# Patient Record
Sex: Female | Born: 1960 | Race: Black or African American | Hispanic: No | Marital: Single | Smoking: Never smoker
Health system: Southern US, Community
[De-identification: ages and names within clinical notes are randomized; demographics above are authoritative.]

## PROBLEM LIST (undated history)

## (undated) DIAGNOSIS — Z87442 Personal history of urinary calculi: Secondary | ICD-10-CM

## (undated) DIAGNOSIS — K219 Gastro-esophageal reflux disease without esophagitis: Secondary | ICD-10-CM

## (undated) DIAGNOSIS — N200 Calculus of kidney: Secondary | ICD-10-CM

## (undated) DIAGNOSIS — E785 Hyperlipidemia, unspecified: Secondary | ICD-10-CM

## (undated) DIAGNOSIS — N1330 Unspecified hydronephrosis: Secondary | ICD-10-CM

## (undated) DIAGNOSIS — E119 Type 2 diabetes mellitus without complications: Secondary | ICD-10-CM

## (undated) DIAGNOSIS — K222 Esophageal obstruction: Secondary | ICD-10-CM

## (undated) DIAGNOSIS — E669 Obesity, unspecified: Secondary | ICD-10-CM

## (undated) DIAGNOSIS — I10 Essential (primary) hypertension: Secondary | ICD-10-CM

## (undated) DIAGNOSIS — R011 Cardiac murmur, unspecified: Secondary | ICD-10-CM

## (undated) DIAGNOSIS — T8859XA Other complications of anesthesia, initial encounter: Secondary | ICD-10-CM

## (undated) DIAGNOSIS — D126 Benign neoplasm of colon, unspecified: Secondary | ICD-10-CM

## (undated) DIAGNOSIS — E079 Disorder of thyroid, unspecified: Secondary | ICD-10-CM

## (undated) DIAGNOSIS — B369 Superficial mycosis, unspecified: Secondary | ICD-10-CM

## (undated) HISTORY — DX: Benign neoplasm of colon, unspecified: D12.6

## (undated) HISTORY — DX: Type 2 diabetes mellitus without complications: E11.9

## (undated) HISTORY — DX: Esophageal obstruction: K22.2

## (undated) HISTORY — PX: WISDOM TOOTH EXTRACTION: SHX21

## (undated) HISTORY — DX: Calculus of kidney: N20.0

---

## 1997-08-21 ENCOUNTER — Encounter: Admission: RE | Admit: 1997-08-21 | Discharge: 1997-08-21 | Payer: Self-pay | Admitting: *Deleted

## 2003-07-04 ENCOUNTER — Emergency Department (HOSPITAL_COMMUNITY): Admission: EM | Admit: 2003-07-04 | Discharge: 2003-07-04 | Payer: Self-pay | Admitting: Emergency Medicine

## 2004-06-21 ENCOUNTER — Emergency Department (HOSPITAL_COMMUNITY): Admission: EM | Admit: 2004-06-21 | Discharge: 2004-06-21 | Payer: Self-pay | Admitting: Family Medicine

## 2004-06-22 ENCOUNTER — Emergency Department (HOSPITAL_COMMUNITY): Admission: EM | Admit: 2004-06-22 | Discharge: 2004-06-22 | Payer: Self-pay | Admitting: Emergency Medicine

## 2005-03-06 DIAGNOSIS — E119 Type 2 diabetes mellitus without complications: Secondary | ICD-10-CM

## 2005-03-06 HISTORY — DX: Type 2 diabetes mellitus without complications: E11.9

## 2005-07-12 ENCOUNTER — Emergency Department (HOSPITAL_COMMUNITY): Admission: EM | Admit: 2005-07-12 | Discharge: 2005-07-12 | Payer: Self-pay | Admitting: Family Medicine

## 2005-07-14 ENCOUNTER — Emergency Department (HOSPITAL_COMMUNITY): Admission: EM | Admit: 2005-07-14 | Discharge: 2005-07-14 | Payer: Self-pay | Admitting: Family Medicine

## 2006-11-10 ENCOUNTER — Emergency Department (HOSPITAL_COMMUNITY): Admission: EM | Admit: 2006-11-10 | Discharge: 2006-11-11 | Payer: Self-pay | Admitting: Emergency Medicine

## 2009-08-04 DIAGNOSIS — N200 Calculus of kidney: Secondary | ICD-10-CM

## 2009-08-04 HISTORY — DX: Calculus of kidney: N20.0

## 2009-08-25 ENCOUNTER — Ambulatory Visit: Payer: Self-pay | Admitting: Diagnostic Radiology

## 2009-08-25 ENCOUNTER — Encounter: Payer: Self-pay | Admitting: Emergency Medicine

## 2009-08-25 ENCOUNTER — Inpatient Hospital Stay (HOSPITAL_COMMUNITY): Admission: AD | Admit: 2009-08-25 | Discharge: 2009-08-28 | Payer: Self-pay | Admitting: Internal Medicine

## 2009-08-28 ENCOUNTER — Encounter: Payer: Self-pay | Admitting: Family Medicine

## 2009-08-30 ENCOUNTER — Ambulatory Visit: Payer: Self-pay | Admitting: Family Medicine

## 2009-08-30 ENCOUNTER — Encounter: Payer: Self-pay | Admitting: Physician Assistant

## 2009-08-30 DIAGNOSIS — E079 Disorder of thyroid, unspecified: Secondary | ICD-10-CM

## 2009-08-30 DIAGNOSIS — N1330 Unspecified hydronephrosis: Secondary | ICD-10-CM

## 2009-08-30 DIAGNOSIS — E1165 Type 2 diabetes mellitus with hyperglycemia: Secondary | ICD-10-CM | POA: Insufficient documentation

## 2009-08-30 DIAGNOSIS — N200 Calculus of kidney: Secondary | ICD-10-CM | POA: Insufficient documentation

## 2009-08-30 HISTORY — DX: Disorder of thyroid, unspecified: E07.9

## 2009-08-30 HISTORY — DX: Unspecified hydronephrosis: N13.30

## 2009-08-30 LAB — CONVERTED CEMR LAB
Bilirubin Urine: NEGATIVE
Glucose, Bld: 189 mg/dL
Glucose, Urine, Semiquant: NEGATIVE
Ketones, urine, test strip: NEGATIVE
Specific Gravity, Urine: 1.01
pH: 5

## 2009-08-31 ENCOUNTER — Encounter: Payer: Self-pay | Admitting: Physician Assistant

## 2009-08-31 LAB — CONVERTED CEMR LAB
BUN: 10 mg/dL (ref 6–23)
Creatinine, Ser: 0.68 mg/dL (ref 0.40–1.20)
Glucose, Bld: 172 mg/dL — ABNORMAL HIGH (ref 70–99)
Microalb Creat Ratio: 387.6 mg/g — ABNORMAL HIGH (ref 0.0–30.0)
Potassium: 4.1 meq/L (ref 3.5–5.3)
TSH: 0.747 microintl units/mL (ref 0.350–4.500)

## 2009-09-02 ENCOUNTER — Telehealth: Payer: Self-pay | Admitting: Physician Assistant

## 2009-09-08 ENCOUNTER — Encounter: Payer: Self-pay | Admitting: Physician Assistant

## 2009-09-14 ENCOUNTER — Ambulatory Visit: Payer: Self-pay | Admitting: Family Medicine

## 2009-09-14 DIAGNOSIS — M545 Low back pain, unspecified: Secondary | ICD-10-CM | POA: Insufficient documentation

## 2009-09-14 DIAGNOSIS — R110 Nausea: Secondary | ICD-10-CM | POA: Insufficient documentation

## 2009-11-18 ENCOUNTER — Encounter: Payer: Self-pay | Admitting: Physician Assistant

## 2009-11-18 LAB — CONVERTED CEMR LAB
BUN: 9 mg/dL (ref 6–23)
CO2: 22 meq/L (ref 19–32)
Chloride: 106 meq/L (ref 96–112)
Cholesterol: 199 mg/dL (ref 0–200)
HDL: 57 mg/dL (ref >39)
Hgb A1c MFr Bld: 7.5 % — ABNORMAL HIGH (ref <5.7)
LDL Cholesterol: 106 mg/dL — ABNORMAL HIGH (ref 0–99)
Total CHOL/HDL Ratio: 3.5
Triglycerides: 179 mg/dL — ABNORMAL HIGH (ref <150)

## 2009-12-01 ENCOUNTER — Ambulatory Visit: Payer: Self-pay | Admitting: Family Medicine

## 2009-12-01 DIAGNOSIS — E785 Hyperlipidemia, unspecified: Secondary | ICD-10-CM

## 2009-12-01 HISTORY — DX: Hyperlipidemia, unspecified: E78.5

## 2009-12-01 LAB — CONVERTED CEMR LAB
Nitrite: NEGATIVE
Specific Gravity, Urine: >=1.03
Urobilinogen, UA: 0.2

## 2009-12-01 LAB — HM DIABETES FOOT EXAM

## 2009-12-28 ENCOUNTER — Encounter: Payer: Self-pay | Admitting: Family Medicine

## 2010-02-22 LAB — CONVERTED CEMR LAB
ALT: 10 units/L (ref 0–35)
AST: 12 units/L (ref 0–37)
Creatinine, Ser: 0.67 mg/dL (ref 0.40–1.20)
Hgb A1c MFr Bld: 9.1 % — ABNORMAL HIGH (ref <5.7)
LDL Cholesterol: 138 mg/dL — ABNORMAL HIGH (ref 0–99)
Potassium: 3.9 meq/L (ref 3.5–5.3)
Total Bilirubin: 0.4 mg/dL (ref 0.3–1.2)
Total CHOL/HDL Ratio: 3.7

## 2010-03-03 ENCOUNTER — Ambulatory Visit: Payer: Self-pay | Admitting: Family Medicine

## 2010-03-03 DIAGNOSIS — I10 Essential (primary) hypertension: Secondary | ICD-10-CM | POA: Insufficient documentation

## 2010-03-04 ENCOUNTER — Ambulatory Visit (HOSPITAL_COMMUNITY)
Admission: RE | Admit: 2010-03-04 | Discharge: 2010-03-04 | Payer: Self-pay | Source: Home / Self Care | Attending: Family Medicine | Admitting: Family Medicine

## 2010-03-09 ENCOUNTER — Telehealth: Payer: Self-pay | Admitting: Family Medicine

## 2010-03-31 ENCOUNTER — Encounter: Payer: Self-pay | Admitting: Family Medicine

## 2010-04-07 NOTE — Letter (Signed)
Summary: [facility]   Imported By: Lind Guest 01/03/2010 10:12:21  _____________________________________________________________________  External Attachment:    Type:   Image     Comment:   External Document

## 2010-04-07 NOTE — Assessment & Plan Note (Signed)
Summary: F UP   Vital Signs:  Patient profile:   50 year old female Menstrual status:  regular LMP:     02/17/2010 Height:      63.75 inches Weight:      188 pounds BMI:     32.64 O2 Sat:      98 % on Room air Pulse rate:   114 / minute Pulse rhythm:   regular Resp:     16 per minute BP sitting:   146 / 90  (left arm)  Vitals Entered By: Adella Hare LPN (March 03, 2010 4:20 PM)  Nutrition Counseling: Patient's BMI is greater than 25 and therefore counseled on weight management options.  O2 Flow:  Room air CC: follow-up visit Is Patient Diabetic? Yes Comments did not bring meds to ov LMP (date): 02/17/2010     Menstrual Status regular Enter LMP: 02/17/2010   CC:  follow-up visit.  History of Present Illness: Reports  thatshe feels well.  she is not testing her sugars, reports weight loss unintentional with excessive thirst, unfortunately her blood sugar is uncontrolled.she is refusing diabetic ed, states she has been through this many times before her mother was diabetic also Denies recent fever or chills. Denies sinus pressure, nasal congestion , ear pain or sore throat. Denies chest congestion, or cough productive of sputum. Denies chest pain, palpitations, PND, orthopnea or leg swelling. Denies abdominal pain, nausea, vomitting, diarrhea or constipation. Denies change in bowel movements or bloody stool. Denies dysuria , frequency, incontinence or hesitancy. Denies  joint pain, swelling, or reduced mobility. Denies headaches, vertigo, seizures. Denies depression, anxiety or insomnia. Denies  rash, lesions, or itch.     Current Medications (verified): 1)  Glipizide 5 Mg Tabs (Glipizide) .... One Tab By Mouth Two Times A Day With Meals 2)  Lisinopril 10 Mg Tabs (Lisinopril) .... One Tab By Mouth Once Daily 3)  Metformin Hcl 1000 Mg Tabs (Metformin Hcl) .... Take 1 Two Times A Day With Food  Allergies (verified): 1)  ! Codeine 2)  ! Sulfa  Past  History:  Past medical, surgical, family and social histories (including risk factors) reviewed, and no changes noted (except as noted below).  Past Medical History: Diabetes mellitus, type II Renal lithiasis 6/11, hiospitalised at [facility] for 3 days in June hematuria, saw urology in 2011 , has stones in her kidney  Past Surgical History: none  Family History: Reviewed history from 08/30/2009 and no changes required. mother deceased- DM, kidney failure, HTN father deceased- DM, HTN, kidney failure two sisters living- Chrones x 1, aneruysm 4 brothers living - DM x 2  Social History: Reviewed history from 08/30/2009 and no changes required. 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 Eyes:  Denies discharge and red eye. Psych:  Denies anxiety and depression. Endo:  Denies cold intolerance, excessive hunger, excessive thirst, and excessive urination. Heme:  Denies abnormal bruising and bleeding. Allergy:  Denies hives or rash, itching eyes, and persistent infections.  Physical Exam  General:  Well-developed,well-nourished,in no acute distress; alert,appropriate and cooperative throughout examination HEENT: No facial asymmetry,  EOMI, No sinus tenderness, , oropharynx  pink and moist.   Chest: Clear to auscultation bilaterally.  CVS: S1, S2, No murmurs, No S3.   Abd: Soft, Nontender.  MS: Adequate ROM spine, hips, shoulders and knees.  Ext:  No edema.   CNS: CN 2-12 intact, power tone and sensation normal throughout.   Skin: Intact, no visible lesions or rashes.  Psych: Good eye contact, normal affect.  Memory intact, not anxious or depressed appearing. ]   Impression & Recommendations:  Problem # 1:  HYPERLIPIDEMIA (ICD-272.4) Assessment Comment Only  Her updated medication list for this problem  includes:    Pravastatin Sodium 20 Mg Tabs (Pravastatin sodium) .Marland Kitchen... Take 1 tab by mouth at bedtime Low fat dietdiscussed and encouraged pt also needs med based on risk factors and being diabetic  Labs Reviewed: SGOT: 12 (02/21/2010)   SGPT: 10 (02/21/2010)   HDL:63 (02/21/2010), 57 (11/11/2009)  LDL:138 (02/21/2010), 106 (11/11/2009)  Chol:231 (02/21/2010), 199 (11/11/2009)  Trig:148 (02/21/2010), 179 (11/11/2009)  Problem # 2:  HYPERTENSION (ICD-401.9) Assessment: Deteriorated  The following medications were removed from the medication list:    Lisinopril 10 Mg Tabs (Lisinopril) ..... One tab by mouth once daily Her updated medication list for this problem includes:    Lisinopril 20 Mg Tabs (Lisinopril) .Marland Kitchen... Take 1 tablet by mouth once a day  BP today: 146/90 Prior BP: 126/84 (12/01/2009)  Labs Reviewed: K+: 3.9 (02/21/2010) Creat: : 0.67 (02/21/2010)   Chol: 231 (02/21/2010)   HDL: 63 (02/21/2010)   LDL: 138 (02/21/2010)   TG: 148 (02/21/2010)  Problem # 3:  DIABETES MELLITUS, TYPE II (ICD-250.00) Assessment: Deteriorated  The following medications were removed from the medication list:    Lisinopril 10 Mg Tabs (Lisinopril) ..... One tab by mouth once daily    Metformin Hcl 1000 Mg Tabs (Metformin hcl) .Marland Kitchen... Take 1 two times a day with food Her updated medication list for this problem includes:    Glipizide 5 Mg Tabs (Glipizide) ..... One tab by mouth two times a day with meals    Lisinopril 20 Mg Tabs (Lisinopril) .Marland Kitchen... Take 1 tablet by mouth once a day    Kombiglyze Xr 2.07-998 Mg Xr24h-tab (Saxagliptin-metformin) .Marland Kitchen... Take 2 tablets by mouth one  time a day has been intolerant of metformin, if she fails konmbiglyze i will neds to increase theglipizide diose Labs Reviewed: Creat: 0.67 (02/21/2010)    Reviewed HgBA1c results: 9.1 (02/21/2010)  7.5 (11/11/2009)  Complete Medication List: 1)  Glipizide 5 Mg Tabs (Glipizide) .... One tab by mouth two times a day with  meals 2)  Lisinopril 20 Mg Tabs (Lisinopril) .... Take 1 tablet by mouth once a day 3)  Pravastatin Sodium 20 Mg Tabs (Pravastatin sodium) .... Take 1 tab by mouth at bedtime 4)  Kombiglyze Xr 2.07-998 Mg Xr24h-tab (Saxagliptin-metformin) .... Take 2 tablets by mouth one  time a day 5)  Fluconazole 150 Mg Tabs (Fluconazole) .... Take 1 tablet by mouth once a day  Other Orders: Radiology Referral (Radiology)  Patient Instructions: 1)  F/U in 6 weeks 2)  It is important that you exercise regularly at least 20 minutes 5 times a week. If you develop chest pain, have severe difficulty breathing, or feel very tired , stop exercising immediately and seek medical attention. 3)  You need to lose weight. Consider a lower calorie diet and regular exercise.  4)  Check your blood sugars regularly. If your readings are usually above 250  or below 70 you should contact our office. 5)  You will start a higher dose of blood pressure med. 6)  You will start a new med for high cholesterol. 7)  Your blood sugars are too high. Prescriptions: FLUCONAZOLE 150 MG TABS (FLUCONAZOLE) Take  1 tablet by mouth once a day  #3 x 0   Entered and Authorized by:   Syliva Overman MD   Signed by:   Syliva Overman MD on 03/03/2010   Method used:   Electronically to        [facility]  [address] (retail)       [address]       [place], [place]  [address]       Ph: [phone]       Fax: [phone]   RxID:   [id number] KOMBIGLYZE XR 2.07-998 MG XR24H-TAB (SAXAGLIPTIN-METFORMIN) Take 2 tablets by mouth one  time a day  #60 x 3   Entered and Authorized by:   Syliva Overman MD   Signed by:   Syliva Overman MD on 03/03/2010   Method used:   Print then Give to Patient   RxID:   [id number] PRAVASTATIN SODIUM 20 MG TABS (PRAVASTATIN SODIUM) Take 1 tab by mouth at bedtime  #30 x 3   Entered and Authorized by:   Syliva Overman MD   Signed by:   Syliva Overman MD on 03/03/2010   Method  used:   Printed then faxed to ...       [facility]  [address] (retail)       [address]       [place], [place]  [address]       Ph: [phone]       Fax: [phone]   RxID:   [id number] LISINOPRIL 20 MG TABS (LISINOPRIL) Take 1 tablet by mouth once a day  #30 x 2   Entered and Authorized by:   Syliva Overman MD   Signed by:   Syliva Overman MD on 03/03/2010   Method used:   Printed then faxed to ...       [facility]  [address] (retail)       [address]       [place], [place]  [address]       Ph: [phone]       Fax: [phone]   RxID:   [id number]    Orders Added: 1)  Est. Patient Level IV [[address]] 2)  Radiology Referral [Radiology]  Appended Document: F UP pt c/o vag itch, likely due to yeast script for fluconazole sent in.  Appended Document: F UP if pt calls next week reporting intolerance to [facility], she has not tolerated metformin in thepast, I recommend increasing the glipizide5mg  tab to one and a half twice daily, new directions will need to be sent to the pharmacy if this is done, and kombiglyze removed from the med list.  Appended Document: F UP patient reports intolerance to kombiglyze. states sugars are still running high. advised to discontinue and give instructions on glipizide increased dose. Adella Hare LPN  March 09, 2010 3:08 PM

## 2010-04-07 NOTE — Assessment & Plan Note (Signed)
Summary: follow up- room 2   Vital Signs:  Patient profile:   50 year old female Height:      63.75 inches Weight:      189.50 pounds BMI:     32.90 O2 Sat:      97 % on Room air Pulse rate:   103 / minute Resp:     16 per minute BP sitting:   126 / 84  (left arm)  Vitals Entered By: Adella Hare LPN (December 01, 2009 4:34 PM)  Nutrition Counseling: Patient's BMI is greater than 25 and therefore counseled on weight management options. CC: follow-up visit Is Patient Diabetic? Yes Pain Assessment Patient in pain? no        CC:  follow-up visit.  History of Present Illness: Pt presents today for check up.  Hx of DM.  Pt went back to Metformin.  She is taking  500mg  bid x approx 1 mos now. Not having GI issues now with Metformin as she wa previously. No episodes of hypoglycemia. Checking blood sugar at home.  Fasting blood sugars average 170-180 . Last eye exam  > 1 yr. Taking ASA daily. No prev Pneumovax  Reports urinary frequency and voiding small amounts only last week. Also had vomiting.  Hx of renal stones.  Not due for f/u with urologist x approx 1 yr.    Current Medications (verified): 1)  Glipizide 5 Mg Tabs (Glipizide) .... One Tab By Mouth Two Times A Day With Meals 2)  Lisinopril 10 Mg Tabs (Lisinopril) .... One Tab By Mouth Once Daily 3)  Onglyza 5 Mg Tabs (Saxagliptin Hcl) .... Take 1 Daily  Allergies (verified): 1)  ! Codeine  Past History:  Past medical history reviewed for relevance to current acute and chronic problems.  Past Medical History: Reviewed history from 08/30/2009 and no changes required. Diabetes mellitus, type II Renal lithiasis 6/11  Review of Systems General:  Denies chills and fever. CV:  Denies chest pain or discomfort and palpitations. Resp:  Denies cough and shortness of breath. GI:  Complains of nausea; denies abdominal pain and vomiting. GU:  Complains of incontinence, urinary frequency, and urinary hesitancy; denies  dysuria.  Physical Exam  General:  Well-developed,well-nourished,in no acute distress; alert,appropriate and cooperative throughout examination Head:  Normocephalic and atraumatic without obvious abnormalities. No apparent alopecia or balding. Ears:  External ear exam shows no significant lesions or deformities.  Otoscopic examination reveals clear canals, tympanic membranes are intact bilaterally without bulging, retraction, inflammation or discharge. Hearing is grossly normal bilaterally. Nose:  External nasal examination shows no deformity or inflammation. Nasal mucosa are pink and moist without lesions or exudates. Mouth:  Oral mucosa and oropharynx without lesions or exudates.  Teeth in good repair. Neck:  No deformities, masses, or tenderness noted. Lungs:  Normal respiratory effort, chest expands symmetrically. Lungs are clear to auscultation, no crackles or wheezes. Heart:  Normal rate and regular rhythm. S1 and S2 normal without gallop, murmur, click, rub or other extra sounds. Pulses:  R posterior tibial normal, R dorsalis pedis normal, L posterior tibial normal, and L dorsalis pedis normal.   Extremities:  No clubbing, cyanosis, edema, or deformity noted with normal full range of motion of all joints.   Skin:  Intact without suspicious lesions or rashes Cervical Nodes:  No lymphadenopathy noted Psych:  Cognition and judgment appear intact. Alert and cooperative with normal attention span and concentration. No apparent delusions, illusions, hallucinations  Diabetes Management Exam:    Foot Exam (  with socks and/or shoes not present):       Sensory-Monofilament:          Left foot: normal          Right foot: normal       Inspection:          Left foot: normal          Right foot: normal       Nails:          Left foot: normal          Right foot: normal   Impression & Recommendations:  Problem # 1:  DIABETES MELLITUS, TYPE II (ICD-250.00) Assessment Comment Only  Her  updated medication list for this problem includes:    Glipizide 5 Mg Tabs (Glipizide) ..... One tab by mouth two times a day with meals    Lisinopril 10 Mg Tabs (Lisinopril) ..... One tab by mouth once daily    Metformin Hcl 1000 Mg Tabs (Metformin hcl) .Marland Kitchen... Take 1 two times a day with food  Labs Reviewed: Creat: 0.88 (11/11/2009)    Reviewed HgBA1c results: 7.5 (11/11/2009)  Orders: T-Comprehensive Metabolic Panel ([id number]) T- Hemoglobin A1C ([address]11-91478)  Problem # 2:  HYPERLIPIDEMIA (ICD-272.4) Assessment: Comment Only    HDL:57 (11/11/2009)  LDL:106 (11/11/2009)  Chol:199 (11/11/2009)  Trig:179 (11/11/2009)  Orders: T-Comprehensive Metabolic Panel ([id number]) T-Lipid Profile ([id number])  Problem # 3:  RENAL CALCULUS (ICD-592.0) Assessment: Comment Only  Pt to call urologist for f/u.  Orders: UA Dipstick w/o Micro (automated)  (81003)  Complete Medication List: 1)  Glipizide 5 Mg Tabs (Glipizide) .... One tab by mouth two times a day with meals 2)  Lisinopril 10 Mg Tabs (Lisinopril) .... One tab by mouth once daily 3)  Metformin Hcl 1000 Mg Tabs (Metformin hcl) .... Take 1 two times a day with food  Other Orders: Influenza Vaccine NON MCR (28413) Pneumococcal Vaccine (24401) Admin 1st Vaccine (02725)  Patient Instructions: 1)  Please schedule a follow-up appointment in 3 months. 2)  I have ordered blood work for you to have drawn fasting in 3 mos before your next appt. 3)  I have increased your Metformin dose. 4)  It is important that you exercise regularly at least 20 minutes 5 times a week. If you develop chest pain, have severe difficulty breathing, or feel very tired , stop exercising immediately and seek medical attention. 5)  You need to lose weight. Consider a lower calorie diet and regular exercise.  6)  See your eye doctor yearly to check for diabetic eye damage. 7)  You need to follow up with your urologist. Prescriptions: METFORMIN HCL 1000 MG  TABS (METFORMIN HCL) take 1 two times a day with food  #60 x 3   Entered and Authorized by:   Esperanza Sheets PA   Signed by:   Esperanza Sheets PA on 12/01/2009   Method used:   Electronically to        [facility]  [address] (retail)       [address]       [place], [place]  [address]       Ph: [phone]       Fax: [phone]   RxID:   [id number]    Immunizations Administered:  Influenza Vaccine # 1:    Vaccine Type: Fluvax Non-MCR    Site: right deltoid    Mfr: novartis    Dose: 0.5 ml  Route: IM    Given by: Adella Hare LPN    Exp. Date: 07/2010    Lot #: [id number]    VIS given: 09/28/09 version given December 02, 2009.  Pneumonia Vaccine:    Vaccine Type: Pneumovax    Site: left deltoid    Mfr: Merck    Dose: 0.5 ml    Route: IM    Given by: Adella Hare LPN    Exp. Date: 05/21/2011    Lot #: [id number]    VIS given: 02/08/09 version given December 02, 2009.    Laboratory Results   Urine Tests  Date/Time Received:   Routine Urinalysis   Color: yellow Appearance: Clear Glucose: negative   (Normal Range: Negative) Bilirubin: negative   (Normal Range: Negative) Ketone: trace (5)   (Normal Range: Negative) Spec. Gravity: >=1.030   (Normal Range: 1.003-1.035) Blood: moderate   (Normal Range: Negative) pH: 5.0   (Normal Range: 5.0-8.0) Protein: 100   (Normal Range: Negative) Urobilinogen: 0.2   (Normal Range: 0-1) Nitrite: negative   (Normal Range: Negative) Leukocyte Esterace: negative   (Normal Range: Negative)

## 2010-04-07 NOTE — Assessment & Plan Note (Signed)
Summary: new patient- room1   Vital Signs:  Patient profile:   50 year old female Height:      63.75 inches Weight:      189.25 pounds BMI:     32.86 O2 Sat:      98 % on Room air Pulse rate:   116 / minute Resp:     16 per minute BP sitting:   140 / 80  (left arm)  Vitals Entered By: Adella Hare LPN (August 30, 2009 1:56 PM) CC: new patient Is Patient Diabetic? Yes Did you bring your meter with you today? No   CC:  new patient.  History of Present Illness: New pt here to establish care with new PCP.  Hx of DM. Would check blood sugar once every 2 weeks.  Fasting sugars avg 150, and then would check 2 h PP and would run 180-220.  Walks for exercise.  Pt was previously trying to control blood sugar with diet and exercise.  She just was started on Metformin and Glipizide inpt.  Metformin is causing some nausea for her. Eye exam 3 yrs ago.  Was inpt at [facility] due to kidney stone.  No prev hx of kidney stone.  Called urologist appt this am as directed and was told that he left the practice this past Fri.  ( Dr Earlene Plater) Past one stone but has another one.  Reviewing hosp records CT Abd & Pelvis showed 2 Lt kidney stones, with Lt Hydronephrosis and hydroureter.  Hgb A1c 10.4, elevated LDL and trigs, and low TSH.   Current Medications (verified): 1)  Metformin Hcl 500 Mg Tabs (Metformin Hcl) .... One Tab By Mouth Two Times A Day 2)  Glipizide 5 Mg Tabs (Glipizide) .... One Tab By Mouth Two Times A Day With Meals  Allergies (verified): 1)  ! Codeine  Past History:  Past medical, surgical, family and social histories (including risk factors) reviewed for relevance to current acute and chronic problems.  Past Medical History: Diabetes mellitus, type II Renal lithiasis 6/11  Family History: Reviewed history and no changes required. mother deceased- DM, kidney failure, HTN father deceased- DM, HTN, kidney failure two sisters living- Chrones x 1, aneruysm 4 brothers living -  DM x 2  Social History: Reviewed history and no changes required. 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 CV:  Denies chest pain or discomfort and palpitations. Resp:  Denies cough and shortness of breath. GI:  Complains of abdominal pain; denies change in bowel habits, nausea, and vomiting. GU:  Complains of dysuria; denies urinary frequency. MS:  Complains of low back pain.  Physical Exam  General:  Well-developed,well-nourished,in no acute distress; alert,appropriate and cooperative throughout examination Head:  Normocephalic and atraumatic without obvious abnormalities. No apparent alopecia or balding. Ears:  External ear exam shows no significant lesions or deformities.  Otoscopic examination reveals clear canals, tympanic membranes are intact bilaterally without bulging, retraction, inflammation or discharge. Hearing is grossly normal bilaterally. Nose:  External nasal examination shows no deformity or inflammation. Nasal mucosa are pink and moist without lesions or exudates. Mouth:  Oral mucosa and oropharynx without lesions or exudates.  Teeth in good repair. Neck:  No deformities, masses, or tenderness noted. Lungs:  Normal respiratory effort, chest expands symmetrically. Lungs are clear to  auscultation, no crackles or wheezes. Heart:  Normal rate and regular rhythm. S1 and S2 normal without gallop, murmur, click, rub or other extra sounds. Abdomen:  soft, normal bowel sounds, no masses, no hepatomegaly, and no splenomegaly.  +TTP L mid abd, with guarding.  No rebound or rigidity Neurologic:  alert & oriented X3 and gait normal.   Skin:  Intact without suspicious lesions or rashes Cervical Nodes:  No lymphadenopathy noted Psych:  Cognition and judgment  appear intact. Alert and cooperative with normal attention span and concentration. No apparent delusions, illusions, hallucinations   Impression & Recommendations:  Problem # 1:  DIABETES MELLITUS, TYPE II (ICD-250.00) Assessment Comment Only Discussed with pt ideally needs an increase in Metformin dose.  Since she is having some nausea with it will wait until recheck appt and see if tolerating better to increase dose.  Her updated medication list for this problem includes:    Metformin Hcl 500 Mg Tabs (Metformin hcl) ..... One tab by mouth two times a day    Glipizide 5 Mg Tabs (Glipizide) ..... One tab by mouth two times a day with meals  Orders: T-Basic Metabolic Panel [id number]) T-Urine Microalbumin w/creat. ratio ([phone]) Glucose, (CBG) (01093)  Problem # 2:  RENAL CALCULUS (ICD-592.0) Assessment: Comment Only  Orders: Urology Referral (Urology)  Problem # 3:  HYDRONEPHROSIS, LEFT (ICD-591) Assessment: Comment Only  Orders: Urology Referral (Urology) Urinalysis ([id number]) T-Culture, Urine ([id number])  Complete Medication List: 1)  Metformin Hcl 500 Mg Tabs (Metformin hcl) .... One tab by mouth two times a day 2)  Glipizide 5 Mg Tabs (Glipizide) .... One tab by mouth two times a day with meals  Other Orders: T-TSH ([id number])  Patient Instructions: 1)  Please schedule a follow-up appointment in 2 weeks. 2)  Continue your current medications.  We will see how you are tolerating the Metformin in 2 weeks. 3)  Check your blood sugar fasting in the morning and 2 hrs after dinner. Bring your readings with you to your next appt. 4)  We have referred you to a urologist. 5)  Take an Aspirin every day.  Laboratory Results   Urine Tests  Date/Time Received: August 30, 2009 3:21 PM  Date/Time Reported: August 30, 2009 3:21 PM   Routine Urinalysis   Color: yellow Appearance: Clear Glucose: negative   (Normal Range: Negative) Bilirubin: negative    (Normal Range: Negative) Ketone: negative   (Normal Range: Negative) Spec. Gravity: 1.010   (Normal Range: 1.003-1.035) Blood: moderate   (Normal Range: Negative) pH: 5.0   (Normal Range: 5.0-8.0) Protein: 100   (Normal Range: Negative) Urobilinogen: 0.2   (Normal Range: 0-1) Nitrite: negative   (Normal Range: Negative) Leukocyte Esterace: small   (Normal Range: Negative)     Blood Tests   Date/Time Received: August 30, 2009 3:22 PM  Date/Time Reported: August 30, 2009 3:22 PM   Glucose (random): 189 mg/dL   (Normal Range: 07-371)

## 2010-04-07 NOTE — Letter (Signed)
Summary: Discharge Summary  Discharge Summary   Imported By: Lind Guest 03/16/2010 10:59:21  _____________________________________________________________________  External Attachment:    Type:   Image     Comment:   External Document

## 2010-04-07 NOTE — Progress Notes (Signed)
Summary: medicine  Phone Note Call from Patient   Summary of Call: [facility] did not recieve her lisinopril rx   please send Initial call taken by: Lind Guest,  March 09, 2010 3:02 PM    Prescriptions: LISINOPRIL 20 MG TABS (LISINOPRIL) Take 1 tablet by mouth once a day  #30 x 1   Entered by:   Adella Hare LPN   Authorized by:   Syliva Overman MD   Signed by:   Adella Hare LPN on 95/62/1308   Method used:   Electronically to        [facility]  [address] (retail)       [address]       [place], [place]  [address]       Ph: [phone]       Fax: [phone]   RxID:   [id number]

## 2010-04-07 NOTE — Progress Notes (Signed)
Summary: letter  Phone Note Call from Patient   Summary of Call: received your letter and call back at [phone] Initial call taken by: Lind Guest,  September 02, 2009 12:50 PM  Follow-up for Phone Call        rx sent and patient aware of lab results Follow-up by: Adella Hare LPN,  September 02, 2009 1:25 PM    New/Updated Medications: LISINOPRIL 10 MG TABS (LISINOPRIL) one tab by mouth once daily Prescriptions: LISINOPRIL 10 MG TABS (LISINOPRIL) one tab by mouth once daily  #30 x 5   Entered by:   Adella Hare LPN   Authorized by:   Esperanza Sheets PA   Signed by:   Adella Hare LPN on 16/12/9602   Method used:   Electronically to        [facility]  [address] (retail)       [address]       [place], [place]  [address]       Ph: [phone]       Fax: [phone]   RxID:   [id number]

## 2010-04-07 NOTE — Letter (Signed)
Summary: Letter  Letter   Imported By: Lind Guest 09/01/2009 14:19:39  _____________________________________________________________________  External Attachment:    Type:   Image     Comment:   External Document

## 2010-04-07 NOTE — Progress Notes (Signed)
Summary: [facility]   Imported By: Lind Guest 09/09/2009 15:24:20  _____________________________________________________________________  External Attachment:    Type:   Image     Comment:   External Document

## 2010-04-07 NOTE — Letter (Signed)
Summary: Letter  Letter   Imported By: Lind Guest 11/19/2009 13:50:08  _____________________________________________________________________  External Attachment:    Type:   Image     Comment:   External Document

## 2010-04-07 NOTE — Assessment & Plan Note (Signed)
Summary: follow up - room 2   Vital Signs:  Patient profile:   50 year old female Height:      63.75 inches Weight:      189 pounds BMI:     32.81 O2 Sat:      97 % on Room air Pulse rate:   101 / minute Resp:     16 per minute BP sitting:   118 / 64  (left arm)  Vitals Entered By: Adella Hare LPN (September 14, 2009 8:41 AM) CC: follow-up visit Is Patient Diabetic? Yes Did you bring your meter with you today? No Pain Assessment Patient in pain? no      Comments did not bring meds to ov   CC:  follow-up visit.  History of Present Illness: Pt presents today for recheck. She is still having daily nausea from Metformin. Also comments that she is having loose stools.  "Everything I eat goes right thru me."  She is still taking it two times a day. Pt brought her blood sugar readings with her today.  Her FBS range 136-172.  And her evening sugars approx 1 - 1 1/2 hrs after dinner avg 147- 172.  Pt saw the urologist for f/u of her kidney stone.  Still has some discomfort but is getting better.  Voiding normally.  Pt also reports in the last 2 weeks pain in her Rt lower back.  Notices it with prolonged standing and certain activities.  No radiation.  No trauma. No prev hx of back problems.  She has not tried any over the counter pain relievers.  "I dont like taking medications."  Pt also c/o vaginal itching and thick white dischg.  She states it looks and feels like prev yeast infection.  She states they did a pelvic exam and vag cxs in the hospital but she didnt hear the results.   Allergies (verified): 1)  ! Codeine  Review of Systems General:  Denies chills and fever. CV:  Denies chest pain or discomfort and palpitations. Resp:  Denies shortness of breath. GI:  Complains of change in bowel habits and nausea; denies abdominal pain and vomiting. MS:  Complains of low back pain; denies joint pain.  Physical Exam  General:  Well-developed,well-nourished,in no acute distress;  alert,appropriate and cooperative throughout examination Head:  Normocephalic and atraumatic without obvious abnormalities. No apparent alopecia or balding. Ears:  External ear exam shows no significant lesions or deformities.  Otoscopic examination reveals clear canals, tympanic membranes are intact bilaterally without bulging, retraction, inflammation or discharge. Hearing is grossly normal bilaterally. Nose:  External nasal examination shows no deformity or inflammation. Nasal mucosa are pink and moist without lesions or exudates. Mouth:  Oral mucosa and oropharynx without lesions or exudates.  Teeth in good repair. Neck:  No deformities, masses, or tenderness noted. Lungs:  Normal respiratory effort, chest expands symmetrically. Lungs are clear to auscultation, no crackles or wheezes. Heart:  Normal rate and regular rhythm. S1 and S2 normal without gallop, murmur, click, rub or other extra sounds. Abdomen:  soft, non-tender, no hepatomegaly, and no splenomegaly.   Msk:  LS spine nontender to palp.  Pt reports with palp of Rt L3-L5 area as the area of pain when it occurs.  Nontender SI joint and sciatic notch. Pulses:  R posterior tibial normal, R dorsalis pedis normal, L posterior tibial normal, and L dorsalis pedis normal.   Extremities:  No PTE Neurologic:  alert & oriented X3, sensation intact to light touch, gait  normal, and DTRs symmetrical and normal.   Cervical Nodes:  No lymphadenopathy noted Psych:  Cognition and judgment appear intact. Alert and cooperative with normal attention span and concentration. No apparent delusions, illusions, hallucinations   Impression & Recommendations:  Problem # 1:  DIABETES MELLITUS, TYPE II (ICD-250.00) Assessment Comment Only  The following medications were removed from the medication list:    Metformin Hcl 500 Mg Tabs (Metformin hcl) ..... One tab by mouth two times a day Her updated medication list for this problem includes:    Glipizide 5 Mg  Tabs (Glipizide) ..... One tab by mouth two times a day with meals    Lisinopril 10 Mg Tabs (Lisinopril) ..... One tab by mouth once daily    Onglyza 2.5 Mg Tabs (Saxagliptin hcl) .Marland Kitchen... Take 1 daily  Orders: T-Basic Metabolic Panel [id number]) T-Lipid Profile [id number]) T- Hemoglobin A1C ([id number])  Problem # 2:  BACK PAIN, LUMBAR (ICD-724.2) Assessment: New  Orders: Miscellaneous Other Radiology (Misc Other Rad)  Problem # 3:  NAUSEA (ICD-787.02) Assessment: Unchanged Due to Metformin.  Problem # 4:  RENAL CALCULUS (ICD-592.0) Assessment: Comment Only Pt will continue f/u with urologist.  Complete Medication List: 1)  Glipizide 5 Mg Tabs (Glipizide) .... One tab by mouth two times a day with meals 2)  Lisinopril 10 Mg Tabs (Lisinopril) .... One tab by mouth once daily 3)  Onglyza 2.5 Mg Tabs (Saxagliptin hcl) .... Take 1 daily 4)  Fluconazole 150 Mg Tabs (Fluconazole) .... Take 1 by mouth  Patient Instructions: 1)  Please schedule a follow-up appointment in 2 months. 2)  I have ordered blood work.  Have this drawn the end of August fasting. 3)  I have changed your Metformin to Ongyza.  If this is too expensive for you please call the office. 4)  Continue your other medications.  But stop the Metformin. 5)  It is important that you exercise regularly at least 20 minutes 5 times a week. If you develop chest pain, have severe difficulty breathing, or feel very tired , stop exercising immediately and seek medical attention. 6)  You need to lose weight. Consider a lower calorie diet and regular exercise.  7)  I ahve ordered an xray of your back. Prescriptions: FLUCONAZOLE 150 MG TABS (FLUCONAZOLE) take 1 by mouth  #1 x 0   Entered and Authorized by:   Esperanza Sheets PA   Signed by:   Esperanza Sheets PA on 09/14/2009   Method used:   Electronically to        [facility]  [address] (retail)       [address]       [place], [place]  [address]       Ph:  [phone]       Fax: [phone]   RxID:   [id number] GLIPIZIDE 5 MG TABS (GLIPIZIDE) one tab by mouth two times a day with meals  #60 x 2   Entered and Authorized by:   Esperanza Sheets PA   Signed by:   Esperanza Sheets PA on 09/14/2009   Method used:   Electronically to        [facility]  [address] (retail)       [address]       [place], [place]  [address]       Ph: [phone]       Fax: [phone]  RxID:   [id number] ONGLYZA 2.5 MG TABS (SAXAGLIPTIN HCL) take 1 daily  #30 x 2   Entered and Authorized by:   Esperanza Sheets PA   Signed by:   Esperanza Sheets PA on 09/14/2009   Method used:   Electronically to        [facility]  [address] (retail)       [address]       [place], [place]  [address]       Ph: [phone]       Fax: [phone]   RxID:   [id number]

## 2010-04-19 ENCOUNTER — Ambulatory Visit: Payer: Self-pay | Admitting: Family Medicine

## 2010-04-20 ENCOUNTER — Encounter: Payer: Self-pay | Admitting: Family Medicine

## 2010-04-27 NOTE — Letter (Signed)
Summary: 1ST NO SHOW  1ST NO SHOW   Imported By: Lind Guest 04/20/2010 09:19:36  _____________________________________________________________________  External Attachment:    Type:   Image     Comment:   External Document

## 2010-05-22 LAB — BASIC METABOLIC PANEL
BUN: 3 mg/dL — ABNORMAL LOW (ref 6–23)
BUN: 8 mg/dL (ref 6–23)
CO2: 27 mEq/L (ref 19–32)
CO2: 29 mEq/L (ref 19–32)
Calcium: 7.8 mg/dL — ABNORMAL LOW (ref 8.4–10.5)
Calcium: 8.7 mg/dL (ref 8.4–10.5)
Chloride: 108 mEq/L (ref 96–112)
Chloride: 109 mEq/L (ref 96–112)
Creatinine, Ser: 0.7 mg/dL (ref 0.4–1.2)
GFR calc non Af Amer: >60 mL/min (ref >60)
Glucose, Bld: 75 mg/dL (ref 70–99)
Potassium: 2.9 mEq/L — ABNORMAL LOW (ref 3.5–5.1)
Potassium: 3.7 mEq/L (ref 3.5–5.1)
Sodium: 140 mEq/L (ref 135–145)

## 2010-05-22 LAB — CBC
HCT: 30.5 % — ABNORMAL LOW (ref 36.0–46.0)
MCH: 31.6 pg (ref 26.0–34.0)
MCH: 31.9 pg (ref 26.0–34.0)
MCHC: 33 g/dL (ref 30.0–36.0)
MCV: 95.8 fL (ref 78.0–100.0)
MCV: 96 fL (ref 78.0–100.0)
Platelets: 197 10*3/uL (ref 150–400)
Platelets: 239 10*3/uL (ref 150–400)
RDW: 11.7 % (ref 11.5–15.5)
RDW: 12.4 % (ref 11.5–15.5)
RDW: 12.5 % (ref 11.5–15.5)
WBC: 11.2 10*3/uL — ABNORMAL HIGH (ref 4.0–10.5)

## 2010-05-22 LAB — COMPREHENSIVE METABOLIC PANEL
ALT: 13 U/L (ref 0–35)
BUN: 12 mg/dL (ref 6–23)
BUN: 5 mg/dL — ABNORMAL LOW (ref 6–23)
Calcium: 7.8 mg/dL — ABNORMAL LOW (ref 8.4–10.5)
Calcium: 9.3 mg/dL (ref 8.4–10.5)
Creatinine, Ser: 0.8 mg/dL (ref 0.4–1.2)
Creatinine, Ser: 0.89 mg/dL (ref 0.4–1.2)
GFR calc Af Amer: >60 mL/min (ref >60)
GFR calc non Af Amer: >60 mL/min (ref >60)
Glucose, Bld: 156 mg/dL — ABNORMAL HIGH (ref 70–99)
Potassium: 4.2 mEq/L (ref 3.5–5.1)
Sodium: 140 mEq/L (ref 135–145)
Sodium: 143 mEq/L (ref 135–145)
Total Protein: 5.9 g/dL — ABNORMAL LOW (ref 6.0–8.3)

## 2010-05-22 LAB — TSH: TSH: 0.187 u[IU]/mL — ABNORMAL LOW (ref 0.350–4.500)

## 2010-05-22 LAB — GLUCOSE, CAPILLARY
Comment 3: 288831
Glucose-Capillary: 102 mg/dL — ABNORMAL HIGH (ref 70–99)
Glucose-Capillary: 103 mg/dL — ABNORMAL HIGH (ref 70–99)
Glucose-Capillary: 151 mg/dL — ABNORMAL HIGH (ref 70–99)
Glucose-Capillary: 173 mg/dL — ABNORMAL HIGH (ref 70–99)
Glucose-Capillary: 175 mg/dL — ABNORMAL HIGH (ref 70–99)
Glucose-Capillary: 177 mg/dL — ABNORMAL HIGH (ref 70–99)
Glucose-Capillary: 193 mg/dL — ABNORMAL HIGH (ref 70–99)
Glucose-Capillary: 376 mg/dL — ABNORMAL HIGH (ref 70–99)
Glucose-Capillary: 457 mg/dL — ABNORMAL HIGH (ref 70–99)
Glucose-Capillary: 466 mg/dL — ABNORMAL HIGH (ref 70–99)

## 2010-05-22 LAB — GC/CHLAMYDIA PROBE AMP, GENITAL
Chlamydia, DNA Probe: NEGATIVE
GC Probe Amp, Genital: NEGATIVE

## 2010-05-22 LAB — DIFFERENTIAL
Eosinophils Absolute: 0 10*3/uL (ref 0.0–0.7)
Lymphocytes Relative: 27 % (ref 12–46)
Lymphs Abs: 3 10*3/uL (ref 0.7–4.0)
Neutrophils Relative %: 69 % (ref 43–77)

## 2010-05-22 LAB — URINALYSIS, ROUTINE W REFLEX MICROSCOPIC
Ketones, ur: 15 mg/dL — AB
Protein, ur: NEGATIVE mg/dL
Urobilinogen, UA: 0.2 mg/dL (ref 0.0–1.0)

## 2010-05-22 LAB — LIPID PANEL
Cholesterol: 196 mg/dL (ref 0–200)
LDL Cholesterol: 116 mg/dL — ABNORMAL HIGH (ref 0–99)

## 2010-05-22 LAB — URINE MICROSCOPIC-ADD ON

## 2010-05-22 LAB — WET PREP, GENITAL

## 2010-05-22 LAB — HEMOGLOBIN A1C
Hgb A1c MFr Bld: 10.4 % — ABNORMAL HIGH (ref <5.7)
Mean Plasma Glucose: 252 mg/dL — ABNORMAL HIGH (ref <117)

## 2010-05-22 LAB — MRSA PCR SCREENING: MRSA by PCR: NEGATIVE

## 2010-05-22 LAB — LIPASE, BLOOD: Lipase: 92 U/L (ref 23–300)

## 2010-05-22 LAB — KETONES, QUALITATIVE: Acetone, Bld: NEGATIVE

## 2010-05-22 LAB — HEMOCCULT GUIAC POC 1CARD (OFFICE): Fecal Occult Bld: NEGATIVE

## 2010-05-23 ENCOUNTER — Ambulatory Visit (INDEPENDENT_AMBULATORY_CARE_PROVIDER_SITE_OTHER): Payer: Private Health Insurance - Indemnity | Admitting: Family Medicine

## 2010-05-23 ENCOUNTER — Encounter: Payer: Self-pay | Admitting: Family Medicine

## 2010-05-23 DIAGNOSIS — E785 Hyperlipidemia, unspecified: Secondary | ICD-10-CM

## 2010-05-23 DIAGNOSIS — B369 Superficial mycosis, unspecified: Secondary | ICD-10-CM | POA: Insufficient documentation

## 2010-05-23 DIAGNOSIS — I10 Essential (primary) hypertension: Secondary | ICD-10-CM

## 2010-05-23 DIAGNOSIS — E119 Type 2 diabetes mellitus without complications: Secondary | ICD-10-CM

## 2010-05-23 HISTORY — DX: Superficial mycosis, unspecified: B36.9

## 2010-05-24 ENCOUNTER — Other Ambulatory Visit: Payer: Self-pay | Admitting: Family Medicine

## 2010-05-24 ENCOUNTER — Encounter: Payer: Self-pay | Admitting: Family Medicine

## 2010-05-25 LAB — LIPID PANEL
Cholesterol: 192 mg/dL (ref 0–200)
HDL: 60 mg/dL (ref >39)
LDL Cholesterol: 103 mg/dL — ABNORMAL HIGH (ref 0–99)
Total CHOL/HDL Ratio: 3.2 Ratio
Triglycerides: 143 mg/dL (ref <150)
VLDL: 29 mg/dL (ref 0–40)

## 2010-05-25 LAB — HEMOGLOBIN A1C
Hgb A1c MFr Bld: 8.2 % — ABNORMAL HIGH (ref <5.7)
Mean Plasma Glucose: 189 mg/dL — ABNORMAL HIGH (ref <117)

## 2010-05-25 LAB — BASIC METABOLIC PANEL
BUN: 9 mg/dL (ref 6–23)
Chloride: 100 mEq/L (ref 96–112)
Potassium: 4 mEq/L (ref 3.5–5.3)
Sodium: 139 mEq/L (ref 135–145)

## 2010-05-25 LAB — HEPATIC FUNCTION PANEL
ALT: 11 U/L (ref 0–35)
Albumin: 4.1 g/dL (ref 3.5–5.2)
Total Protein: 7.5 g/dL (ref 6.0–8.3)

## 2010-05-31 ENCOUNTER — Other Ambulatory Visit: Payer: Self-pay

## 2010-05-31 MED ORDER — PRAVASTATIN SODIUM 40 MG PO TABS: 40.0000 mg | ORAL_TABLET | Freq: Every evening | ORAL | Status: DC

## 2010-05-31 NOTE — Progress Notes (Signed)
Opened in error

## 2010-06-02 MED ORDER — SITAGLIPTIN PHOS-METFORMIN HCL 50-500 MG PO TABS: 1.0000 | ORAL_TABLET | Freq: Two times a day (BID) | ORAL | Status: DC

## 2010-06-02 NOTE — Assessment & Plan Note (Signed)
Summary: f up from december   Vital Signs:  Patient profile:   50 year old female Menstrual status:  regular Height:      63.5 inches Weight:      189 pounds BMI:     33.07 O2 Sat:      99 % Pulse rate:   87 / minute Pulse rhythm:   regular Resp:     16 per minute BP sitting:   142 / 92  (left arm) Cuff size:   large  Vitals Entered By: Everitt Amber LPN (May 23, 2010 3:56 PM)  Nutrition Counseling: Patient's BMI is greater than 25 and therefore counseled on weight management options. CC: Follow up chronic problems   CC:  Follow up chronic problems.  History of Present Illness: Reports  that she has been doing fairly well.l. Denies recent fever or chills. Denies sinus pressure, nasal congestion , ear pain or sore throat. Denies chest congestion, or cough productive of sputum. Denies chest pain, palpitations, PND, orthopnea or leg swelling. Denies abdominal pain, nausea, vomitting, diarrhea or constipation. Denies change in bowel movements or bloody stool. Denies dysuria , frequency, incontinence or hesitancy. Denies  joint pain, swelling, or reduced mobility. Denies headaches, vertigo, seizures. Denies depression, anxiety or insomnia.    Current Medications (verified): 1)  Glipizide 5 Mg Tabs (Glipizide) .... One and Half Tabs By Mouth Two Times A Day  Dose Change 2)  Lisinopril 20 Mg Tabs (Lisinopril) .... Take 1 Tablet By Mouth Once A Day 3)  Pravastatin Sodium 20 Mg Tabs (Pravastatin Sodium) .... Take 1 Tab By Mouth At Bedtime  Allergies (verified): 1)  ! Codeine 2)  ! Sulfa  Review of Systems      See HPI Eyes:  Denies discharge, eye pain, and red eye. Derm:  Complains of itching and rash; puritic rash on ant chest this past week, uncertain if an allergy, requested retin a which she had tried. Endo:  Denies excessive thirst and excessive urination; pt has her log with fasting blood sugar s documented , she has been testing religiously, they are averaging 130  to 140. Heme:  Denies abnormal bruising and bleeding. Allergy:  Denies hives or rash, itching eyes, and persistent infections.  Physical Exam  General:  Well-developed,well-nourished,in no acute distress; alert,appropriate and cooperative throughout examination HEENT: No facial asymmetry,  EOMI, No sinus tenderness, TM's Clear, oropharynx  pink and moist.   Chest: Clear to auscultation bilaterally.  CVS: S1, S2, No murmurs, No S3.   Abd: Soft, Nontender.  MS: Adequate ROM spine, hips, shoulders and knees.  Ext: No edema.   CNS: CN 2-12 intact, power tone and sensation normal throughout.   Skin: erythematous scaly rash on anterior chest and neck.  Psych: Good eye contact, normal affect.  Memory intact, not anxious or depressed appearing.     Impression & Recommendations:  Problem # 1:  HYPERTENSION (ICD-401.9) Assessment Unchanged  The following medications were removed from the medication list:    Lisinopril 20 Mg Tabs (Lisinopril) .Marland Kitchen... Take 1 tablet by mouth once a day Her updated medication list for this problem includes:    Lisinopril 40 Mg Tabs (Lisinopril) .Marland Kitchen... Take 1 tablet by mouth once a day dose increase effective 05/23/2010  Orders: T-Basic Metabolic Panel ([id number])  BP today: 142/92 Prior BP: 146/90 (03/03/2010)  Labs Reviewed: K+: 3.9 (02/21/2010) Creat: : 0.67 (02/21/2010)   Chol: 231 (02/21/2010)   HDL: 63 (02/21/2010)   LDL: 138 (02/21/2010)   TG: 148 (02/21/2010)  Problem # 2:  HYPERLIPIDEMIA (ICD-272.4) Assessment: Comment Only  Her updated medication list for this problem includes:    Pravastatin Sodium 20 Mg Tabs (Pravastatin sodium) .Marland Kitchen... Take 1 tab by mouth at bedtime Low fat dietdiscussed and encouraged  Orders: T-Lipid Profile ([id number]) T-Hepatic Function [id number])  Labs Reviewed: SGOT: 12 (02/21/2010)   SGPT: 10 (02/21/2010)   HDL:63 (02/21/2010), 57 (11/11/2009)  LDL:138 (02/21/2010), 106 (11/11/2009)  Chol:231 (02/21/2010),  199 (11/11/2009)  Trig:148 (02/21/2010), 179 (11/11/2009)  Problem # 3:  DIABETES MELLITUS, TYPE II (ICD-250.00) Assessment: Comment Only  The following medications were removed from the medication list:    Glipizide 5 Mg Tabs (Glipizide) ..... One and half tabs by mouth two times a day  dose change    Lisinopril 20 Mg Tabs (Lisinopril) .Marland Kitchen... Take 1 tablet by mouth once a day Her updated medication list for this problem includes:    Glipizide 10 Mg Tabs (Glipizide) .Marland Kitchen... Take 1 tablet by mouth two times a day dose increase effective 05/23/2010, pls stop glipizide 5mg     Lisinopril 40 Mg Tabs (Lisinopril) .Marland Kitchen... Take 1 tablet by mouth once a day dose increase effective 05/23/2010  Orders: T- Hemoglobin A1C ([id number])  Labs Reviewed: Creat: 0.67 (02/21/2010)    Reviewed HgBA1c results: 9.1 (02/21/2010)  7.5 (11/11/2009) Patient advised to reduce carbs and sweets, commit to regular physical activity, take meds as prescribed, test blood sugars as directed, and attempt to lose weight , to improve blood sugar control.  Problem # 4:  DERMATOMYCOSIS (ICD-111.9) Assessment: Comment Only  The following medications were removed from the medication list:    Fluconazole 150 Mg Tabs (Fluconazole) .Marland Kitchen... Take 1 tablet by mouth once a day Her updated medication list for this problem includes:    Clotrimazole-betamethasone 1-0.05 % Lotn (Clotrimazole-betamethasone) .Marland Kitchen... Apply twice daily to rash on anteror chest for 2 weeks , then as needed  Complete Medication List: 1)  Pravastatin Sodium 20 Mg Tabs (Pravastatin sodium) .... Take 1 tab by mouth at bedtime 2)  Glipizide 10 Mg Tabs (Glipizide) .... Take 1 tablet by mouth two times a day dose increase effective 05/23/2010, pls stop glipizide 5mg  3)  Clotrimazole-betamethasone 1-0.05 % Lotn (Clotrimazole-betamethasone) .... Apply twice daily to rash on anteror chest for 2 weeks , then as needed 4)  Lisinopril 40 Mg Tabs (Lisinopril) .... Take 1 tablet  by mouth once a day dose increase effective 05/23/2010  Patient Instructions: 1)  Please schedule a follow-up appointment in 3 months. 2)  It is important that you exercise regularly at least 30 minutes 5 times a week. If you develop chest pain, have severe difficulty breathing, or feel very tired , stop exercising immediately and seek medical attention. 3)  You need to lose weight. Consider a lower calorie diet and regular exercise.  4)  BMP prior to visit, ICD-9: 5)  Hepatic Panel prior to visit, ICD-9:    labs tomorrow 6)  Lipid Panel prior to visit, ICD-9: 7)  HbgA1C prior to visit, ICD-9: 8)  Dose increases on your blood pressure and diabetes meds as discussed. 9)  Pls finish current, meds 10)  Glipizide 5mg  TWO tabs twice daily 11)  Lisinopril 20mg  one twice daily, 9:30 am and PM for approx 5 days , then 2 once daily 12)  Cream sent in for rash Prescriptions: LISINOPRIL 40 MG TABS (LISINOPRIL) Take 1 tablet by mouth once a day dose increase effective 05/23/2010  #30 x 2   Entered and Authorized by:   Claris Che  Lodema Hong MD   Signed by:   Syliva Overman MD on 05/23/2010   Method used:   Printed then faxed to ...       [facility] [address] [facility] (retail)       [address]       [place], [place]  [address]       Ph: [phone]       Fax: [phone]   RxID:   [id number] CLOTRIMAZOLE-BETAMETHASONE 1-0.05 % LOTN (CLOTRIMAZOLE-BETAMETHASONE) apply twice daily to rash on anteror chest for 2 weeks , then as needed  #60 gm x 2   Entered and Authorized by:   Syliva Overman MD   Signed by:   Syliva Overman MD on 05/23/2010   Method used:   Electronically to        [facility]  [address] [address] [facility] (retail)       [address]       [place], [place]  [address]       Ph: [phone]       Fax: [phone]   RxID:   [id number] GLIPIZIDE 10 MG TABS (GLIPIZIDE) Take 1 tablet by mouth two times a day dose increase effective 05/23/2010, pls stop glipizide 5mg    #60 x 3   Entered and Authorized by:   Syliva Overman MD   Signed by:   Syliva Overman MD on 05/23/2010   Method used:   Printed then faxed to ...       [facility]  [address] [address] (retail)       [address]       [place], [place]  [address]       Ph: [phone]       Fax: [phone]   RxID:   [id number]    Orders Added: 1)  Est. Patient Level IV [[address]] 2)  T-Basic Metabolic Panel [phone] 3)  T-Lipid Profile [80061-22930] 4)  T-Hepatic Function [[address]76-22960] 5)  T- Hemoglobin A1C [[address]-23375]

## 2010-06-02 NOTE — Progress Notes (Signed)
Patient aware and coming to get card

## 2010-06-14 ENCOUNTER — Telehealth: Payer: Self-pay

## 2010-06-20 ENCOUNTER — Telehealth: Payer: Self-pay | Admitting: *Deleted

## 2010-06-20 ENCOUNTER — Inpatient Hospital Stay (HOSPITAL_COMMUNITY): Admission: RE | Admit: 2010-06-20 | Payer: Private Health Insurance - Indemnity | Source: Ambulatory Visit

## 2010-06-20 NOTE — Telephone Encounter (Signed)
Opened in error

## 2010-06-20 NOTE — Progress Notes (Signed)
  Subjective:    Patient ID: Caitlin Thompson, female    DOB: 09/29/1960, 50 y.o.   MRN: [id number]  HPI    Review of Systems     Objective:   Physical Exam        Assessment & Plan:  No visit on this date in this system

## 2010-06-27 ENCOUNTER — Other Ambulatory Visit: Payer: Self-pay

## 2010-06-27 ENCOUNTER — Telehealth: Payer: Self-pay | Admitting: Family Medicine

## 2010-06-27 MED ORDER — BENZONATATE 100 MG PO CAPS: 100.0000 mg | ORAL_CAPSULE | Freq: Three times a day (TID) | ORAL | Status: DC | PRN

## 2010-06-27 NOTE — Telephone Encounter (Signed)
Advise and erx tessalon perles 100mg  one 3 times daily as needed for cough and chest congestion #30 refill 1

## 2010-06-27 NOTE — Telephone Encounter (Signed)
rx called into [facility] [place] [facility]. 

## 2010-06-27 NOTE — Telephone Encounter (Signed)
Rx called in 

## 2010-08-11 ENCOUNTER — Other Ambulatory Visit: Payer: Self-pay | Admitting: Family Medicine

## 2010-08-19 ENCOUNTER — Encounter: Payer: Self-pay | Admitting: Family Medicine

## 2010-08-23 ENCOUNTER — Encounter: Payer: Self-pay | Admitting: Family Medicine

## 2010-08-23 ENCOUNTER — Ambulatory Visit (INDEPENDENT_AMBULATORY_CARE_PROVIDER_SITE_OTHER): Payer: Private Health Insurance - Indemnity | Admitting: Family Medicine

## 2010-08-23 VITALS — BP 140/90 | HR 88 | Resp 16 | Ht 63.5 in | Wt 188.0 lb

## 2010-08-23 DIAGNOSIS — E119 Type 2 diabetes mellitus without complications: Secondary | ICD-10-CM

## 2010-08-23 DIAGNOSIS — Z01419 Encounter for gynecological examination (general) (routine) without abnormal findings: Secondary | ICD-10-CM

## 2010-08-23 DIAGNOSIS — I10 Essential (primary) hypertension: Secondary | ICD-10-CM

## 2010-08-23 DIAGNOSIS — E785 Hyperlipidemia, unspecified: Secondary | ICD-10-CM

## 2010-08-23 MED ORDER — AMLODIPINE BESYLATE 5 MG PO TABS: 5.0000 mg | ORAL_TABLET | Freq: Every day | ORAL | Status: DC

## 2010-08-23 MED ORDER — PRAVASTATIN SODIUM 40 MG PO TABS: 40.0000 mg | ORAL_TABLET | Freq: Every evening | ORAL | Status: DC

## 2010-08-23 MED ORDER — GLIPIZIDE 10 MG PO TABS: 10.0000 mg | ORAL_TABLET | Freq: Two times a day (BID) | ORAL | Status: DC

## 2010-08-23 NOTE — Patient Instructions (Addendum)
F/U in 3.5 months  HBA1C and chem 7  Today   Fasting lipid, chem 7, hepatic and hBA1C in 3.5 months.  PLS call us about testing supplies you want.  It is important that you exercise regularly at least 30 minutes 5 times a week. If you develop chest pain, have severe difficulty breathing, or feel very tired, stop exercising immediately and seek medical attention    A healthy diet is rich in fruit, vegetables and whole grains. Poultry fish, nuts and beans are a healthy choice for protein rather then red meat. A low sodium diet and drinking 64 ounces of water daily is generally recommended. Oils and sweet should be limited. Carbohydrates especially for those who are diabetic or overweight, should be limited to 34-45 gram per meal. It is important to eat on a regular schedule, at least 3 times daily. Snacks should be primarily fruits, vegetables or nuts. You will be referred to Dr. Cherly Hensen for gynae exam and contraceptive management

## 2010-08-24 LAB — BASIC METABOLIC PANEL
BUN: 11 mg/dL (ref 6–23)
Creat: 0.85 mg/dL (ref 0.50–1.10)
Glucose, Bld: 191 mg/dL — ABNORMAL HIGH (ref 70–99)
Potassium: 3.7 mEq/L (ref 3.5–5.3)

## 2010-08-24 LAB — HEMOGLOBIN A1C: Hgb A1c MFr Bld: 7 % — ABNORMAL HIGH (ref <5.7)

## 2010-08-25 MED ORDER — SITAGLIPTIN PHOS-METFORMIN HCL 50-1000 MG PO TABS: 1.0000 | ORAL_TABLET | Freq: Two times a day (BID) | ORAL | Status: DC

## 2010-08-25 NOTE — Progress Notes (Signed)
Med change for janumet sent to pharmacy, patient aware

## 2010-08-26 NOTE — Progress Notes (Signed)
Patient aware.

## 2010-09-08 NOTE — Progress Notes (Signed)
  Subjective:    Patient ID: Caitlin Thompson, female    DOB: 05-03-1960, 50 y.o.   MRN: [id number]  HPI HYPERTENSION Disease Monitoring Blood pressure range-unknown Chest pain- no      Dyspnea- no Medications Compliance- good Lightheadedness- no   Edema- no   DIABETES Disease Monitoring Blood Sugar ranges-fastings between 100 to 135 Polyuria- no New Visual problems- no Medications Compliance- good Hypoglycemic symptoms- no   HYPERLIPIDEMIA Disease Monitoring See symptoms for Hypertension Medications Compliance- good RUQ pain- no  Muscle aches- no  Pt in for review of her chronic conditions. She states she is highly motivated to changing her diet to improve her health.   Review of Systems Denies recent fever or chills. Denies sinus pressure, nasal congestion, ear pain or sore throat. Denies chest congestion, productive cough or wheezing. Denies chest pains, palpitations, paroxysmal nocturnal dyspnea, orthopnea and leg swelling Denies abdominal pain, nausea, vomiting,diarrhea or constipation.  Denies rectal bleeding or change in bowel movement. Denies dysuria, frequency, hesitancy or incontinence. Denies joint pain, swelling and limitation in mobility. Denies headaches, seizure, numbness, or tingling. Denies depression, anxiety or insomnia. Denies skin break down or rash.        Objective:   Physical Exam     Patient alert and oriented and in no Cardiopulmonary distress.  HEENT: No facial asymmetry, EOMI, no sinus tenderness, TM's clear, Oropharynx pink and moist.  Neck supple no adenopathy.  Chest: Clear to auscultation bilaterally.  CVS: S1, S2 no murmurs, no S3.  ABD: Soft non tender. Bowel sounds normal.  Ext: No edema  MS: Adequate ROM spine, shoulders, hips and knees.  Skin: Intact, no ulcerations or rash noted.  Psych: Good eye contact, normal affect. Memory intact not anxious or depressed appearing.  CNS: CN 2-12 intact, power, tone and  sensation normal throughout.    Assessment & Plan:

## 2010-09-08 NOTE — Assessment & Plan Note (Signed)
Improved,by pt history will f/u on labs and adjust med as needed once available

## 2010-09-08 NOTE — Assessment & Plan Note (Signed)
Uncontrolled. Medication compliance addressed. Commitment to regular exercise, and healthy  eating habits with portion control discussed. DASH diet, and low fat diet discussed, and literature offered. No changes in medication at this time.  

## 2010-09-08 NOTE — Assessment & Plan Note (Signed)
Low fat diet discussed and encouraged, will f/u on lab data to determine control

## 2010-09-09 ENCOUNTER — Telehealth: Payer: Self-pay | Admitting: Family Medicine

## 2010-09-09 NOTE — Telephone Encounter (Signed)
disconnected

## 2010-09-10 ENCOUNTER — Encounter (HOSPITAL_BASED_OUTPATIENT_CLINIC_OR_DEPARTMENT_OTHER): Payer: Self-pay

## 2010-09-10 ENCOUNTER — Emergency Department (HOSPITAL_BASED_OUTPATIENT_CLINIC_OR_DEPARTMENT_OTHER)
Admission: EM | Admit: 2010-09-10 | Discharge: 2010-09-10 | Disposition: A | Discharge status: Home / Self Care | Payer: Managed Care, Other (non HMO) | Attending: Emergency Medicine | Admitting: Emergency Medicine

## 2010-09-10 DIAGNOSIS — T6391XA Toxic effect of contact with unspecified venomous animal, accidental (unintentional), initial encounter: Secondary | ICD-10-CM | POA: Insufficient documentation

## 2010-09-10 DIAGNOSIS — L500 Allergic urticaria: Secondary | ICD-10-CM | POA: Insufficient documentation

## 2010-09-10 DIAGNOSIS — E119 Type 2 diabetes mellitus without complications: Secondary | ICD-10-CM | POA: Insufficient documentation

## 2010-09-10 DIAGNOSIS — I10 Essential (primary) hypertension: Secondary | ICD-10-CM | POA: Insufficient documentation

## 2010-09-10 DIAGNOSIS — T63461A Toxic effect of venom of wasps, accidental (unintentional), initial encounter: Secondary | ICD-10-CM | POA: Insufficient documentation

## 2010-09-10 HISTORY — DX: Essential (primary) hypertension: I10

## 2010-09-10 LAB — GLUCOSE, CAPILLARY: Glucose-Capillary: 225 mg/dL — ABNORMAL HIGH (ref 70–99)

## 2010-09-10 MED ORDER — DIPHENHYDRAMINE HCL 50 MG/ML IJ SOLN
25.0000 mg | Freq: Once | INTRAMUSCULAR | Status: AC
  Administered 2010-09-10: 25 mg via INTRAVENOUS

## 2010-09-10 MED ORDER — METHYLPREDNISOLONE SODIUM SUCC 125 MG IJ SOLR: 125.0000 mg | INTRAMUSCULAR | Status: DC

## 2010-09-10 MED ORDER — FAMOTIDINE IN NACL 20-0.9 MG/50ML-% IV SOLN
20.0000 mg | Freq: Once | INTRAVENOUS | Status: AC
  Administered 2010-09-10: 20 mg via INTRAVENOUS

## 2010-09-10 MED ORDER — METHYLPREDNISOLONE SODIUM SUCC 125 MG IJ SOLR
INTRAMUSCULAR | Status: AC
  Filled 2010-09-10: qty 2

## 2010-09-10 MED ORDER — METHYLPREDNISOLONE SODIUM SUCC 125 MG IJ SOLR: 125.0000 mg | Freq: Once | INTRAMUSCULAR | Status: DC

## 2010-09-10 MED ORDER — FAMOTIDINE IN NACL 20-0.9 MG/50ML-% IV SOLN
INTRAVENOUS | Status: AC
  Filled 2010-09-10: qty 50

## 2010-09-10 MED ORDER — DIPHENHYDRAMINE HCL 50 MG/ML IJ SOLN
INTRAMUSCULAR | Status: AC
  Filled 2010-09-10: qty 1

## 2010-09-10 MED ORDER — SODIUM CHLORIDE 0.9 % IV SOLN
Freq: Once | INTRAVENOUS | Status: AC
  Administered 2010-09-10: 14:00:00 via INTRAVENOUS

## 2010-09-10 NOTE — ED Notes (Signed)
Stung twixe- 1 on left knee and 1 on right knee 1 week ago; still having warmth tenderness and swelling to right knee; no hives; no SOB

## 2010-09-10 NOTE — ED Provider Notes (Signed)
History     Chief Complaint  Patient presents with  . Insect Bite    localized reaction to wasp sting   HPI  Past Medical History  Diagnosis Date  . Diabetes mellitus type II   . Renal lithiasis 6/11    Hospitalised a [facility] fr 3 days in June   . Hematuria   . Kidney stones     saw urologist in 2011  . Hypertension     History reviewed. No pertinent past surgical history.  Family History  Problem Relation Age of Onset  . Diabetes Brother     x2  . Crohn's disease Sister   . Aneurysm Sister   . Diabetes Mother   . Diabetes Father   . Hypertension Mother   . Hypertension Father   . Kidney failure Mother   . Kidney failure Father     History  Substance Use Topics  . Smoking status: Never Smoker   . Smokeless tobacco: Not on file  . Alcohol Use: No    OB History    Grav Para Term Preterm Abortions TAB SAB Ect Mult Living                  Review of Systems  Physical Exam  BP 157/97  Pulse 106  Temp(Src) 98.9 F (37.2 C) (Oral)  Resp 20  Ht 5\' 3"  (1.6 m)  Wt 182 lb (82.555 kg)  BMI 32.24 kg/m2  SpO2 100%  LMP 08/02/2010  Physical Exam  ED Course  Procedures   pt feels much better,  Rash has resolved.  Pt advised monitor glucose carefully. Take benadryl 50 mg every 4 hours then pepcid 20 mg every 12 hours.       Caitlin Thompson, [place] 09/10/10 1333

## 2010-09-10 NOTE — ED Provider Notes (Signed)
History     Chief Complaint  Patient presents with  . Insect Bite    localized reaction to wasp sting   HPI Comments: Pt has hives after receiving 2 bee stings 1 week ago.  Pt complains of itching all over,  Pt has redness around stings on left and right knee.  Patient is a 50 y.o. female presenting with urticaria. The history is provided by the patient.  Urticaria This is a new problem. The current episode started yesterday. The problem occurs constantly. The problem has been unchanged. Associated symptoms include a rash. The symptoms are aggravated by nothing. She has tried nothing for the symptoms.  Urticaria This is a new problem. The current episode started yesterday. The problem occurs constantly. The problem has been unchanged. The symptoms are aggravated by nothing. She has tried nothing for the symptoms.    Past Medical History  Diagnosis Date  . Diabetes mellitus type II   . Renal lithiasis 6/11    Hospitalised a [place] fr 3 days in June   . Hematuria   . Kidney stones     saw urologist in 2011  . Hypertension     History reviewed. No pertinent past surgical history.  Family History  Problem Relation Age of Onset  . Diabetes Brother     x2  . Crohn's disease Sister   . Aneurysm Sister   . Diabetes Mother   . Diabetes Father   . Hypertension Mother   . Hypertension Father   . Kidney failure Mother   . Kidney failure Father     History  Substance Use Topics  . Smoking status: Never Smoker   . Smokeless tobacco: Not on file  . Alcohol Use: No    OB History    Grav Para Term Preterm Abortions TAB SAB Ect Mult Living                  Review of Systems  Skin: Positive for rash.  All other systems reviewed and are negative.    Physical Exam  BP 157/97  Pulse 106  Temp(Src) 98.9 F (37.2 C) (Oral)  Resp 20  Ht 5\' 3"  (1.6 m)  Wt 182 lb (82.555 kg)  BMI 32.24 kg/m2  SpO2 100%  LMP 08/02/2010  Physical Exam  Nursing note and vitals  reviewed. Constitutional: She is oriented to person, place, and time. She appears well-developed and well-nourished.  HENT:  Head: Normocephalic.  Eyes: Pupils are equal, round, and reactive to light.  Cardiovascular: Normal rate, regular rhythm and normal heart sounds.   Pulmonary/Chest: Effort normal and breath sounds normal.  Musculoskeletal: Normal range of motion.  Neurological: She is alert and oriented to person, place, and time.  Skin: Rash noted. Rash is urticarial.       Generalized hives    ED Course  Procedures  MDM       [name], [place] 09/10/10 1316   I agree with history and physical examination of Cecille Po and discussed her management with [name], PAC.   Olivea Sonnen Y.   Gavin Pound. Jacey Eckerson, MD 09/10/10 1323

## 2010-09-10 NOTE — ED Notes (Signed)
Patient is resting comfortably. Feels better; drowsy from meds;

## 2010-09-10 NOTE — ED Notes (Signed)
Pt states that her blood sugars have been running high all week as well as BP since insect sting; no s/s of infection; has not had BP meds this am. No resp distress or hives

## 2010-09-10 NOTE — ED Notes (Signed)
Patient is resting comfortably.  Feels better; states itching is gone; drowsy from meds

## 2010-09-12 NOTE — Telephone Encounter (Signed)
Called another # in chart, left message

## 2010-09-14 NOTE — Telephone Encounter (Signed)
Patient had reaction to bee sting, went to ER

## 2010-09-23 ENCOUNTER — Other Ambulatory Visit: Payer: Self-pay | Admitting: Family Medicine

## 2010-09-23 NOTE — ED Provider Notes (Signed)
Pt care discussed with PAC and is treated under my supervision.  Caitlin Thompson Y.   Gavin Pound. [name], MD 09/23/10 [phone]

## 2010-10-03 ENCOUNTER — Telehealth: Payer: Self-pay | Admitting: Family Medicine

## 2010-10-04 NOTE — Telephone Encounter (Signed)
CALLED PATIENT, LEFT MESSAGE.  

## 2010-10-05 ENCOUNTER — Telehealth: Payer: Self-pay | Admitting: Family Medicine

## 2010-10-05 DIAGNOSIS — T63441A Toxic effect of venom of bees, accidental (unintentional), initial encounter: Secondary | ICD-10-CM

## 2010-10-05 MED ORDER — DOXYCYCLINE HYCLATE 100 MG PO TABS: 100.0000 mg | ORAL_TABLET | Freq: Two times a day (BID) | ORAL | Status: AC

## 2010-10-05 NOTE — Telephone Encounter (Signed)
Pt had bee stings 4 weeks ago, still reports 2 areas of concern, one healing the other painful and red, blood sugars remain high, also c/ospraining? Her leg wants x ray, she is to have MD eval thursd or Friday and I will erx doxycycline

## 2010-10-05 NOTE — Telephone Encounter (Signed)
Patient states her sugar is running high, in 200's and 300's, states she is not doing anything any different, still taking meds as prescribed, states this has been happening since she was stung by the wasps

## 2010-10-05 NOTE — Telephone Encounter (Signed)
Message left for pt to call back , pls get me to the phone if she calls

## 2010-10-06 ENCOUNTER — Other Ambulatory Visit: Payer: Self-pay | Admitting: Occupational Medicine

## 2010-10-06 ENCOUNTER — Ambulatory Visit: Payer: Self-pay

## 2010-10-06 ENCOUNTER — Ambulatory Visit (INDEPENDENT_AMBULATORY_CARE_PROVIDER_SITE_OTHER): Payer: Managed Care, Other (non HMO) | Admitting: Family Medicine

## 2010-10-06 ENCOUNTER — Encounter: Payer: Self-pay | Admitting: Family Medicine

## 2010-10-06 VITALS — BP 120/82 | HR 100 | Resp 16 | Ht 63.0 in | Wt 189.8 lb

## 2010-10-06 DIAGNOSIS — I10 Essential (primary) hypertension: Secondary | ICD-10-CM

## 2010-10-06 DIAGNOSIS — E785 Hyperlipidemia, unspecified: Secondary | ICD-10-CM

## 2010-10-06 DIAGNOSIS — R52 Pain, unspecified: Secondary | ICD-10-CM

## 2010-10-06 DIAGNOSIS — E119 Type 2 diabetes mellitus without complications: Secondary | ICD-10-CM

## 2010-10-06 DIAGNOSIS — M79672 Pain in left foot: Secondary | ICD-10-CM

## 2010-10-06 DIAGNOSIS — M79609 Pain in unspecified limb: Secondary | ICD-10-CM

## 2010-10-06 MED ORDER — GLUCOSE BLOOD VI STRP: ORAL_STRIP | Status: DC

## 2010-10-06 MED ORDER — FREESTYLE LANCETS MISC: Status: DC

## 2010-10-06 NOTE — Patient Instructions (Addendum)
F/u as before   You will be referred to orthopedics re the left foot pain following trauma asap  pls ensure you get fasting labs before next visit.  Continue the advil for pain  Your sugar should continue to improve

## 2010-10-06 NOTE — Progress Notes (Signed)
  Subjective:    Patient ID: Caitlin Thompson, female    DOB: 24-Aug-1960, 50 y.o.   MRN: [id number]  HPI 3 days ago when leaving workplace pt stumbled over plastic bottle rolling out of a trashcan. Unable to weight bear since and hasexquisite point tenderness, wearing a boot, left foot affected. Prior to this she had been experiencing increased blood sugars, had allergic reaction which necessitated urgent care/ed visit, and apparently a prednisone dose pack or at least depomedrol or similar at the facility. Prior to that her blood sugars had been doing well. She called one day ago with concerns of a possible skin infection on her thigh where she had been stung and is also on antibiotics, though at the visit this area is of much less concern  Review of Systems Denies recent fever or chills. Denies sinus pressure, nasal congestion, ear pain or sore throat. Denies chest congestion, productive cough or wheezing. Denies chest pains, palpitations and leg swelling Denies abdominal pain, nausea, vomiting,diarrhea or constipation.   Denies dysuria, frequency, hesitancy or incontinence.  Denies headaches, seizures, numbness, or tingling. Denies depression, anxiety or insomnia. Reports blood sugars to be over 200 most of the time fasting in the past several days      Objective:   Physical Exam  Patient alert and oriented and in no cardiopulmonary distress.  HEENT: No facial asymmetry, EOMI, no sinus tenderness,  oropharynx pink and moist.  Neck supple no adenopathy.  Chest: Clear to auscultation bilaterally.  CVS: S1, S2 no murmurs, no S3.  ABD: Soft non tender. Bowel sounds normal.  Ext: No edema  MS: Adequate ROM spine, shoulders, hips and knees.Point tenderness over the left foot, also pt unable to fully weight bear on the fool   Skin: Intact,2 hyperpigmented lesions one on each thigh, no warmth or drainage or erythema from either. Psych: Good eye contact, normal affect. Memory  intact not anxious or depressed appearing.  CNS: CN 2-12 intact, power, tone and sensation normal throughout.       Assessment & Plan:

## 2010-10-12 ENCOUNTER — Other Ambulatory Visit: Payer: Self-pay | Admitting: Family Medicine

## 2010-10-12 MED ORDER — RELION LANCETS MISC: Status: DC

## 2010-10-12 MED ORDER — GLUCOSE BLOOD VI STRP: ORAL_STRIP | Status: AC

## 2010-10-12 MED ORDER — RELION CONFIRM GLUCOSE MONITOR W/DEVICE KIT: 1.0000 | PACK | Freq: Every day | Status: DC

## 2010-10-12 NOTE — Telephone Encounter (Signed)
Wants relion meter, strips, lancets

## 2010-10-12 NOTE — Telephone Encounter (Signed)
Sent in relion meter and supplies as patient requested

## 2010-10-17 NOTE — Assessment & Plan Note (Signed)
Urgent ortho eval due to point tenderness and inability to weight bear

## 2010-10-17 NOTE — Assessment & Plan Note (Signed)
Controlled, no change in medication  

## 2010-10-17 NOTE — Assessment & Plan Note (Signed)
Currently uncontrolled due to recent steroid exposure for severe allergic reaction, pt to modify med briefly and is advised to call if her numbers remain elevated , already she does note mild improvement in the last 2 days

## 2010-10-17 NOTE — Assessment & Plan Note (Signed)
Low fat diet encouraged, no med change

## 2010-12-02 ENCOUNTER — Other Ambulatory Visit: Payer: Self-pay | Admitting: Family Medicine

## 2010-12-08 ENCOUNTER — Encounter: Payer: Self-pay | Admitting: Family Medicine

## 2010-12-10 LAB — LIPID PANEL
Cholesterol: 176 mg/dL (ref 0–200)
Triglycerides: 108 mg/dL (ref <150)
VLDL: 22 mg/dL (ref 0–40)

## 2010-12-10 LAB — HEPATIC FUNCTION PANEL
ALT: 11 U/L (ref 0–35)
Alkaline Phosphatase: 49 U/L (ref 39–117)
Bilirubin, Direct: 0.1 mg/dL (ref 0.0–0.3)
Indirect Bilirubin: 0.4 mg/dL (ref 0.0–0.9)
Total Protein: 8 g/dL (ref 6.0–8.3)

## 2010-12-10 LAB — BASIC METABOLIC PANEL
BUN: 14 mg/dL (ref 6–23)
CO2: 22 mEq/L (ref 19–32)
Glucose, Bld: 103 mg/dL — ABNORMAL HIGH (ref 70–99)
Potassium: 3.8 mEq/L (ref 3.5–5.3)

## 2010-12-10 LAB — HEMOGLOBIN A1C: Hgb A1c MFr Bld: 7.3 % — ABNORMAL HIGH (ref <5.7)

## 2010-12-13 ENCOUNTER — Ambulatory Visit (INDEPENDENT_AMBULATORY_CARE_PROVIDER_SITE_OTHER): Payer: Managed Care, Other (non HMO) | Admitting: Family Medicine

## 2010-12-13 ENCOUNTER — Encounter: Payer: Self-pay | Admitting: Family Medicine

## 2010-12-13 VITALS — BP 130/80 | HR 95 | Resp 16 | Ht 63.0 in | Wt 189.8 lb

## 2010-12-13 DIAGNOSIS — Z23 Encounter for immunization: Secondary | ICD-10-CM

## 2010-12-13 DIAGNOSIS — I10 Essential (primary) hypertension: Secondary | ICD-10-CM

## 2010-12-13 DIAGNOSIS — E785 Hyperlipidemia, unspecified: Secondary | ICD-10-CM

## 2010-12-13 DIAGNOSIS — E119 Type 2 diabetes mellitus without complications: Secondary | ICD-10-CM

## 2010-12-13 MED ORDER — SITAGLIPTIN PHOS-METFORMIN HCL 50-1000 MG PO TABS: 1.0000 | ORAL_TABLET | Freq: Two times a day (BID) | ORAL | Status: DC

## 2010-12-13 NOTE — Patient Instructions (Signed)
F/u in 3 months.  Pls  Call and attend nutrition class for diabetes  Cholesterol is excellent.  It is important that you exercise regularly at least 30 minutes 5 times a week. If you develop chest pain, have severe difficulty breathing, or feel very tired, stop exercising immediately and seek medical attention   A healthy diet is rich in fruit, vegetables and whole grains. Poultry fish, nuts and beans are a healthy choice for protein rather then red meat. A low sodium diet and drinking 64 ounces of water daily is generally recommended. Oils and sweet should be limited. Carbohydrates especially for those who are diabetic or overweight, should be limited to 30-45 gram per meal. It is important to eat on a regular schedule, at least 3 times daily. Snacks should be primarily fruits, vegetables or nuts.  Goal for fasting blood sugar ranges from 80 to 120 and 2 hours after any meal or at bedtime should be between 130 to 170.  Labs prior to f/u

## 2010-12-15 LAB — MICROALBUMIN / CREATININE URINE RATIO
Creatinine, Urine: 119.3 mg/dL
Microalb Creat Ratio: 70.4 mg/g — ABNORMAL HIGH (ref 0.0–30.0)
Microalb, Ur: 8.4 mg/dL — ABNORMAL HIGH (ref 0.00–1.89)

## 2010-12-18 NOTE — Assessment & Plan Note (Signed)
Deteriorated. Pt refuses insulin therapy, will work harder at lifestyle, dose of janumet increased

## 2010-12-18 NOTE — Assessment & Plan Note (Signed)
Hyperlipidemia:Low fat diet discussed and encouraged.  Controlled, no change in medication   

## 2010-12-18 NOTE — Assessment & Plan Note (Signed)
Controlled, no change in medication  

## 2010-12-18 NOTE — Progress Notes (Signed)
  Subjective:    Patient ID: Caitlin Thompson, female    DOB: 01-May-1960, 50 y.o.   MRN: [id number]  HPI The PT is here for follow up and re-evaluation of chronic medical conditions, medication management and review of any available recent lab and radiology data.  Preventive health is updated, specifically  Cancer screening and Immunization.   Questions or concerns regarding consultations or procedures which the PT has had in the interim are  addressed. The PT denies any adverse reactions to current medications since the last visit.  Pt concerned that hBa1C is higher than expected, but is unwilling to start insulin and also unwilling to see endo to hear the same thing. Hopefully , she will attend diabetic ed class  There are no specific complaints       Review of Systems See HPI Denies recent fever or chills. Denies sinus pressure, nasal congestion, ear pain or sore throat. Denies chest congestion, productive cough or wheezing. Denies chest pains, palpitations and leg swelling Denies abdominal pain, nausea, vomiting,diarrhea or constipation.   Denies dysuria, frequency, hesitancy or incontinence. Denies joint pain, swelling and limitation in mobility. Denies headaches, seizures, numbness, or tingling. Denies depression, anxiety or insomnia. Denies skin break down or rash.        Objective:   Physical Exam Patient alert and oriented and in no cardiopulmonary distress.  HEENT: No facial asymmetry, EOMI, no sinus tenderness,  oropharynx pink and moist.  Neck supple no adenopathy.  Chest: Clear to auscultation bilaterally.  CVS: S1, S2 no murmurs, no S3.  ABD: Soft non tender. Bowel sounds normal.  Ext: No edema  MS: Adequate ROM spine, shoulders, hips and knees.  Skin: Intact, no ulcerations or rash noted.  Psych: Good eye contact, normal affect. Memory intact not anxious or depressed appearing.  CNS: CN 2-12 intact, power, tone and sensation normal  throughout.        Assessment & Plan:

## 2011-01-07 ENCOUNTER — Other Ambulatory Visit: Payer: Self-pay | Admitting: Family Medicine

## 2011-01-19 ENCOUNTER — Telehealth: Payer: Self-pay | Admitting: Family Medicine

## 2011-01-19 NOTE — Telephone Encounter (Signed)
Offer appt for tomorrow where sheil cancelled pls

## 2011-01-20 ENCOUNTER — Encounter: Payer: Self-pay | Admitting: Family Medicine

## 2011-01-20 ENCOUNTER — Ambulatory Visit: Payer: Self-pay | Admitting: Family Medicine

## 2011-01-23 ENCOUNTER — Ambulatory Visit: Payer: Self-pay | Admitting: Family Medicine

## 2011-01-23 ENCOUNTER — Encounter: Payer: Self-pay | Admitting: Family Medicine

## 2011-02-23 ENCOUNTER — Other Ambulatory Visit: Payer: Self-pay | Admitting: Family Medicine

## 2011-02-23 DIAGNOSIS — Z139 Encounter for screening, unspecified: Secondary | ICD-10-CM

## 2011-03-13 ENCOUNTER — Ambulatory Visit (HOSPITAL_COMMUNITY)
Admission: RE | Admit: 2011-03-13 | Discharge: 2011-03-13 | Disposition: A | Discharge status: Home / Self Care | Payer: Managed Care, Other (non HMO) | Source: Ambulatory Visit | Attending: Family Medicine | Admitting: Family Medicine

## 2011-03-13 DIAGNOSIS — Z139 Encounter for screening, unspecified: Secondary | ICD-10-CM

## 2011-03-13 DIAGNOSIS — Z1231 Encounter for screening mammogram for malignant neoplasm of breast: Secondary | ICD-10-CM | POA: Insufficient documentation

## 2011-03-16 ENCOUNTER — Ambulatory Visit: Payer: Self-pay | Admitting: Family Medicine

## 2011-03-28 ENCOUNTER — Telehealth: Payer: Self-pay | Admitting: Family Medicine

## 2011-03-28 NOTE — Telephone Encounter (Signed)
Entered in wrong chart. Corrected lab info on correct patient

## 2011-03-29 ENCOUNTER — Encounter: Payer: Self-pay | Admitting: Family Medicine

## 2011-03-30 ENCOUNTER — Other Ambulatory Visit: Payer: Self-pay | Admitting: Family Medicine

## 2011-03-31 ENCOUNTER — Encounter: Payer: Self-pay | Admitting: Family Medicine

## 2011-03-31 LAB — BASIC METABOLIC PANEL
Chloride: 97 mEq/L (ref 96–112)
Creat: 0.85 mg/dL (ref 0.50–1.10)
Potassium: 4.3 mEq/L (ref 3.5–5.3)

## 2011-03-31 LAB — HEMOGLOBIN A1C
Hgb A1c MFr Bld: 7.5 % — ABNORMAL HIGH (ref <5.7)
Mean Plasma Glucose: 169 mg/dL — ABNORMAL HIGH (ref <117)

## 2011-04-03 ENCOUNTER — Encounter: Payer: Self-pay | Admitting: Family Medicine

## 2011-04-03 ENCOUNTER — Ambulatory Visit (INDEPENDENT_AMBULATORY_CARE_PROVIDER_SITE_OTHER): Payer: Managed Care, Other (non HMO) | Admitting: Family Medicine

## 2011-04-03 VITALS — BP 130/80 | HR 87 | Resp 16 | Ht 63.0 in | Wt 194.4 lb

## 2011-04-03 DIAGNOSIS — R112 Nausea with vomiting, unspecified: Secondary | ICD-10-CM

## 2011-04-03 DIAGNOSIS — K219 Gastro-esophageal reflux disease without esophagitis: Secondary | ICD-10-CM

## 2011-04-03 DIAGNOSIS — E785 Hyperlipidemia, unspecified: Secondary | ICD-10-CM

## 2011-04-03 DIAGNOSIS — R110 Nausea: Secondary | ICD-10-CM

## 2011-04-03 DIAGNOSIS — R51 Headache: Secondary | ICD-10-CM

## 2011-04-03 DIAGNOSIS — R519 Headache, unspecified: Secondary | ICD-10-CM

## 2011-04-03 DIAGNOSIS — M542 Cervicalgia: Secondary | ICD-10-CM

## 2011-04-03 DIAGNOSIS — E119 Type 2 diabetes mellitus without complications: Secondary | ICD-10-CM

## 2011-04-03 DIAGNOSIS — I10 Essential (primary) hypertension: Secondary | ICD-10-CM

## 2011-04-03 HISTORY — DX: Gastro-esophageal reflux disease without esophagitis: K21.9

## 2011-04-03 MED ORDER — OMEPRAZOLE 40 MG PO CPDR: 40.0000 mg | DELAYED_RELEASE_CAPSULE | Freq: Every day | ORAL | Status: DC

## 2011-04-03 MED ORDER — METFORMIN HCL 500 MG PO TABS: ORAL_TABLET | ORAL | Status: DC

## 2011-04-03 NOTE — Patient Instructions (Signed)
F/U in 3 month  Stop janumet , change to metformin 500mg  one to two tablets twice daily   Continue glipizide  Hpylori today.   You are referred for gall bladder US and if both tests are negative you will be referred  To gi  Start omeprazole for reflux

## 2011-04-06 ENCOUNTER — Telehealth: Payer: Self-pay | Admitting: Family Medicine

## 2011-04-07 ENCOUNTER — Other Ambulatory Visit: Payer: Self-pay

## 2011-04-07 ENCOUNTER — Telehealth: Payer: Self-pay | Admitting: Family Medicine

## 2011-04-07 DIAGNOSIS — E119 Type 2 diabetes mellitus without complications: Secondary | ICD-10-CM

## 2011-04-07 MED ORDER — METFORMIN HCL 500 MG PO TABS: ORAL_TABLET | ORAL | Status: DC

## 2011-04-07 NOTE — Telephone Encounter (Signed)
Patient is aware 

## 2011-04-07 NOTE — Telephone Encounter (Signed)
Been going on for 2 weeks. Keeps waking up with migraine about 1am and the pain is bad in her shoulder area and she can barely lift her arms. Running down her neck and shoulders. Wakes her up with terrible migraine and aching in her shoulder. Every other day at least. Since she is going to have the scan for her gallbladder next week she wants to know if you can order another type of test to check it out

## 2011-04-10 DIAGNOSIS — R519 Headache, unspecified: Secondary | ICD-10-CM | POA: Insufficient documentation

## 2011-04-10 DIAGNOSIS — M542 Cervicalgia: Secondary | ICD-10-CM | POA: Insufficient documentation

## 2011-04-10 NOTE — Assessment & Plan Note (Signed)
Uncontrolled symptoms of heartburn and reflux, also reports dysphagia

## 2011-04-10 NOTE — Assessment & Plan Note (Signed)
Worsening nausea associated with bloating, also dysphagia and reflux symptoms, trial of PPI and gall bladder studies

## 2011-04-10 NOTE — Assessment & Plan Note (Signed)
Controlled, no change in medication  

## 2011-04-10 NOTE — Telephone Encounter (Signed)
Spoke directly with patient and tests have been ordered

## 2011-04-10 NOTE — Assessment & Plan Note (Signed)
2 week h/o severe neck pain radiating to both shoulders and halfway down arm , associated with weakness and numbness in arem

## 2011-04-10 NOTE — Progress Notes (Signed)
  Subjective:    Patient ID: Caitlin Thompson, female    DOB: 05-22-1960, 51 y.o.   MRN: [id number]  HPI Pt in for f/u of her chronic medical conditions. She reports an approx 4 to 5 month history of worsening heartburn  With nausea and bloating. Also feels as though her food is sticking when she tries to swallow. Reports intolerance to janumet , states it worsens her symptoms. When advised she would need to start insulin stated that she would go back on metformin only and work harder on lifestyle Her blood sugars have been elevated when checked, she reports fasting sugars are often around 140 and states she continues to have a lot of fluctuation in her blood sugars. Pt called in after the visit with a 2 week h/o disabling headache waking her around 2 in the morning, also neck pain radiating down her shoulders , limiting her ability to raise her arms, also experiencing numbness and tingling in the arms   Review of Systems See HPI Denies recent fever or chills. Denies sinus pressure, nasal congestion, ear pain or sore throat. Denies chest congestion, productive cough or wheezing. Denies chest pains, palpitations and leg swelling Denies  diarrhea or constipation.   Denies dysuria, frequency, hesitancy or incontinence.  Denies  Seizures, c/o , numbness,and  Tingling in shoulder and upper arms for the past 2 weeks Denies depression, anxiety or insomnia. Denies skin break down or rash.        Objective:   Physical Exam  Patient alert and oriented and in no cardiopulmonary distress.  HEENT: No facial asymmetry, EOMI, no sinus tenderness,  oropharynx pink and moist.  Neck  Decreased ROM, no jVD, no adenopathy.  Chest: Clear to auscultation bilaterally.  CVS: S1, S2 no murmurs, no S3.  ABD: Soft non tender. Bowel sounds normal.  Ext: No edema  MS: decreased ROM neck and shoulders, adequate in lumbar spine, hips and knees  Skin: Intact, no ulcerations or rash noted.  Psych: Good  eye contact, normal affect. Memory intact not anxious or depressed appearing.  CNS: CN 2-12 intact,  tone  normal throughout.Decreased sensation in upper extremities       Assessment & Plan:

## 2011-04-10 NOTE — Assessment & Plan Note (Signed)
Reports nausea with some emesis at times, seems to be worse with janumet which she wants to stop

## 2011-04-10 NOTE — Assessment & Plan Note (Signed)
Controlled when last checked, no med change low fat diet discussed and encouraged

## 2011-04-10 NOTE — Assessment & Plan Note (Signed)
2 week h/o posterior headache waking patient , associated with nausea, radiates down shoulders, no similar headache in over 15 years

## 2011-04-10 NOTE — Assessment & Plan Note (Signed)
Uncontrolled, pt has not been taking janumet. I believe she will benefit greatly from long acting insulin, continues to resist this

## 2011-04-11 ENCOUNTER — Ambulatory Visit (HOSPITAL_COMMUNITY)
Admission: RE | Admit: 2011-04-11 | Discharge: 2011-04-11 | Disposition: A | Discharge status: Home / Self Care | Payer: Managed Care, Other (non HMO) | Source: Ambulatory Visit | Attending: Family Medicine | Admitting: Family Medicine

## 2011-04-11 ENCOUNTER — Other Ambulatory Visit: Payer: Self-pay | Admitting: Family Medicine

## 2011-04-11 DIAGNOSIS — R110 Nausea: Secondary | ICD-10-CM

## 2011-04-11 DIAGNOSIS — R109 Unspecified abdominal pain: Secondary | ICD-10-CM | POA: Insufficient documentation

## 2011-04-13 ENCOUNTER — Ambulatory Visit (HOSPITAL_COMMUNITY)
Admission: RE | Admit: 2011-04-13 | Discharge: 2011-04-13 | Disposition: A | Discharge status: Home / Self Care | Payer: Managed Care, Other (non HMO) | Source: Ambulatory Visit | Attending: Family Medicine | Admitting: Family Medicine

## 2011-04-13 DIAGNOSIS — M542 Cervicalgia: Secondary | ICD-10-CM | POA: Insufficient documentation

## 2011-04-13 DIAGNOSIS — R519 Headache, unspecified: Secondary | ICD-10-CM

## 2011-04-13 DIAGNOSIS — R51 Headache: Secondary | ICD-10-CM | POA: Insufficient documentation

## 2011-04-13 DIAGNOSIS — M5020 Other cervical disc displacement, unspecified cervical region: Secondary | ICD-10-CM | POA: Insufficient documentation

## 2011-04-13 DIAGNOSIS — M25519 Pain in unspecified shoulder: Secondary | ICD-10-CM | POA: Insufficient documentation

## 2011-04-14 ENCOUNTER — Other Ambulatory Visit: Payer: Self-pay | Admitting: Family Medicine

## 2011-04-14 DIAGNOSIS — M5090 Cervical disc disorder, unspecified, unspecified cervical region: Secondary | ICD-10-CM

## 2011-04-14 MED ORDER — GABAPENTIN 100 MG PO CAPS: ORAL_CAPSULE | ORAL | Status: DC

## 2011-04-17 ENCOUNTER — Other Ambulatory Visit: Payer: Self-pay | Admitting: Family Medicine

## 2011-04-17 ENCOUNTER — Telehealth: Payer: Self-pay | Admitting: Family Medicine

## 2011-04-17 DIAGNOSIS — M542 Cervicalgia: Secondary | ICD-10-CM

## 2011-04-17 MED ORDER — PREDNISONE (PAK) 5 MG PO TABS: 5.0000 mg | ORAL_TABLET | ORAL | Status: DC

## 2011-04-17 NOTE — Telephone Encounter (Signed)
pred dose pack has been sent in pls let her know

## 2011-04-18 ENCOUNTER — Inpatient Hospital Stay
Admission: RE | Admit: 2011-04-18 | Discharge: 2011-04-18 | Payer: Managed Care, Other (non HMO) | Source: Ambulatory Visit | Attending: Family Medicine | Admitting: Family Medicine

## 2011-04-18 DIAGNOSIS — M542 Cervicalgia: Secondary | ICD-10-CM

## 2011-04-18 NOTE — Telephone Encounter (Signed)
Patient aware.

## 2011-04-20 ENCOUNTER — Encounter (HOSPITAL_COMMUNITY)
Admission: RE | Admit: 2011-04-20 | Discharge: 2011-04-20 | Disposition: A | Discharge status: Home / Self Care | Payer: Managed Care, Other (non HMO) | Source: Ambulatory Visit | Attending: Family Medicine | Admitting: Family Medicine

## 2011-04-20 DIAGNOSIS — R110 Nausea: Secondary | ICD-10-CM | POA: Insufficient documentation

## 2011-04-20 DIAGNOSIS — R1011 Right upper quadrant pain: Secondary | ICD-10-CM | POA: Insufficient documentation

## 2011-04-20 DIAGNOSIS — R6881 Early satiety: Secondary | ICD-10-CM | POA: Insufficient documentation

## 2011-04-20 MED ORDER — TECHNETIUM TC 99M MEBROFENIN IV KIT
5.0000 | PACK | Freq: Once | INTRAVENOUS | Status: AC | PRN
  Administered 2011-04-20: 5 via INTRAVENOUS

## 2011-04-20 MED ORDER — SINCALIDE 5 MCG IJ SOLR
INTRAMUSCULAR | Status: AC
  Filled 2011-04-20: qty 5

## 2011-04-26 ENCOUNTER — Telehealth: Payer: Self-pay | Admitting: Family Medicine

## 2011-04-27 ENCOUNTER — Other Ambulatory Visit: Payer: Self-pay | Admitting: Family Medicine

## 2011-04-27 DIAGNOSIS — R110 Nausea: Secondary | ICD-10-CM

## 2011-04-27 NOTE — Telephone Encounter (Signed)
i referred pt to dr. Jena Gauss office. Pt is aware

## 2011-04-27 NOTE — Telephone Encounter (Signed)
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

## 2011-05-05 DIAGNOSIS — D126 Benign neoplasm of colon, unspecified: Secondary | ICD-10-CM

## 2011-05-05 HISTORY — DX: Benign neoplasm of colon, unspecified: D12.6

## 2011-05-08 ENCOUNTER — Ambulatory Visit (INDEPENDENT_AMBULATORY_CARE_PROVIDER_SITE_OTHER): Payer: Managed Care, Other (non HMO) | Admitting: Urgent Care

## 2011-05-08 ENCOUNTER — Encounter: Payer: Self-pay | Admitting: Urgent Care

## 2011-05-08 DIAGNOSIS — R112 Nausea with vomiting, unspecified: Secondary | ICD-10-CM

## 2011-05-08 DIAGNOSIS — R197 Diarrhea, unspecified: Secondary | ICD-10-CM | POA: Insufficient documentation

## 2011-05-08 DIAGNOSIS — R1013 Epigastric pain: Secondary | ICD-10-CM

## 2011-05-08 DIAGNOSIS — E079 Disorder of thyroid, unspecified: Secondary | ICD-10-CM

## 2011-05-08 MED ORDER — PEG-KCL-NACL-NASULF-NA ASC-C 100 G PO SOLR: 1.0000 | Freq: Once | ORAL | Status: DC

## 2011-05-08 NOTE — Progress Notes (Signed)
Referring Provider: Simpson, Margaret, MD Primary Care Physician:  Margaret Simpson, MD, MD Primary Gastroenterologist:  Dr. Rourk  Chief Complaint  Patient presents with  . Nausea  . Abdominal Pain   HPI:  Caitlin Thompson is a 51 y.o. female here as a referral from Dr. Simpson for 6 MO HISTORY OF WORSENING NAUSEA AND VOMITING. She notes nausea and vomiting of undigested food within couple hrs of eating.  Denies heartburn but has constant indigestion.  She has been given a trial of PPI, however she admits to not taking meds. Also has Dysphagia where solids and feels stomach in her mid-esophagus.  No problems with liquids.  Appetite has been ok.  +early satiety.  She thought her symptoms were due to a new diabetes med, but stopped this after 3 mo.  she did notice minimal improvement.  Hx DM x 2yrs-blood sugars 160-200's, "can't keep meds down".  Hgb a1c has been high.  H Pylori negative.  C/o Epigastric pain, worse w/ meds/eating. 10/10 at worst. Generally lasts 25 minutes, intermittent.  BM avg 4 per day-loose watery.  Never had a colonoscopy.  No fever +chills.  Has been having headaches and taking rare ibuprofen 200 mg.  Wt stable.  Normal HIDA scan 2/16.  Normal abdominal ultrasound 2/5.  Past Medical History  Diagnosis Date  . Diabetes mellitus type II   . Renal lithiasis 6/11    Hospitalised a [place] fr 3 days in June   . Hematuria   . Hypertension    Surgical History: Denies any  Current Outpatient Prescriptions  Medication Sig Dispense Refill  . amLODipine (NORVASC) 5 MG tablet Take 1 tablet (5 mg total) by mouth daily.  30 tablet  11  . Blood Glucose Monitoring Suppl (RELION CONFIRM GLUCOSE MONITOR) W/DEVICE KIT 1 kit by Does not apply route daily.  1 kit  0  . glipiZIDE (GLUCOTROL) 10 MG tablet Take 1 tablet (10 mg total) by mouth 2 (two) times daily.  60 tablet  11  . glucose blood (RELION GLUCOSE TEST STRIPS) test strip Once daily testing Dx:250.00  100 each  11  .  hydrocortisone 1 % cream Apply topically 2 (two) times daily.        . lisinopril (PRINIVIL,ZESTRIL) 40 MG tablet TAKE ONE TABLET BY MOUTH EVERY DAY  30 tablet  4  . metFORMIN (GLUCOPHAGE) 500 MG tablet Two tablets twice daily Discontinue janumet effective 04/03/2011  120 tablet  3  . pravastatin (PRAVACHOL) 40 MG tablet Take 1 tablet (40 mg total) by mouth every evening.  30 tablet  11  . ReliOn Lancets MISC Once daily testing Dx:250.00  100 each  11  . peg 3350 powder (MOVIPREP) 100 G SOLR Take 1 kit (100 g total) by mouth once. As directed Please purchase 1 Fleets enema to use with the prep  1 kit  0   Allergies as of 05/08/2011 - Review Complete 05/08/2011  Allergen Reaction Noted  . Codeine    . Sulfonamide derivatives     Family History:There is no known family history of colorectal carcinoma , liver disease, or inflammatory bowel disease.  Problem Relation Age of Onset  . Diabetes Brother     x2  . Crohn's disease Sister   . Aneurysm Sister   . Diabetes Mother   . Diabetes Father   . Hypertension Mother   . Hypertension Father   . Kidney failure Mother   . Kidney failure Father    History     Social History  . Marital Status: Single    Spouse Name: N/A    Number of Children: 1  . Years of Education: N/A   Occupational History  . Fulltime- [employer]      [place]  .  [employer]   Social History Main Topics  . Smoking status: Never Smoker   . Smokeless tobacco: Not on file  . Alcohol Use: No  . Drug Use: No  . Sexually Active: Yes    Birth Control/ Protection: None   Other Topics Concern  . Not on file   Social History Narrative   1 GROWN DAUGHTER   Review of Systems: Gen: Denies any fever, chills, sweats, anorexia, fatigue, weakness, malaise, weight loss, and sleep disorder CV: Denies chest pain, angina, palpitations, syncope, orthopnea, PND, peripheral edema, and claudication. Resp: Denies dyspnea at rest, dyspnea with  exercise, cough, sputum, wheezing, coughing up blood, and pleurisy. GI: Denies vomiting blood, jaundice, and fecal incontinence.   GU : Denies urinary burning, blood in urine, urinary frequency, urinary hesitancy, nocturnal urination, and urinary incontinence. MS: Denies joint pain, limitation of movement, and swelling, stiffness, low back pain, extremity pain. Denies muscle weakness, cramps, atrophy.  Derm: Denies rash, itching, dry skin, hives, moles, warts, or unhealing ulcers.  Psych: Denies depression, anxiety, memory loss, suicidal ideation, hallucinations, paranoia, and confusion. Heme: Denies bruising, bleeding, and enlarged lymph nodes. Neuro:  Denies any headaches, dizziness, paresthesias. Endo:  Denies any problems with DM, thyroid, adrenal function.  Physical Exam: BP 150/90  Pulse 103  Temp(Src) 98.4 F (36.9 C) (Temporal)  Ht 5' 3" (1.6 m)  Wt 193 lb 12.8 oz (87.907 kg)  BMI 34.33 kg/m2  LMP 05/01/2011 General:   Alert,  Well-developed, well-nourished, pleasant and cooperative in NAD Head:  Normocephalic and atraumatic. Eyes:  Sclera clear, no icterus.   Conjunctiva pink. Ears:  Normal auditory acuity. Nose:  No deformity, discharge, or lesions. Mouth:  No deformity or lesions,oropharynx pink & moist. Neck:  Supple; no masses or thyromegaly. Lungs:  Clear throughout to auscultation.   No wheezes, crackles, or rhonchi. No acute distress. Heart:  Regular rate and rhythm; no murmurs, clicks, rubs,  or gallops. Abdomen:  Normal bowel sounds.  No bruits.  Soft and non-distended without masses, hepatosplenomegaly or hernias noted.  + mild epigastric tenderness on palpation.  No guarding or rebound tenderness.   Rectal:  Deferred. Msk:  Symmetrical without gross deformities. Normal posture. Pulses:  Normal pulses noted. Extremities:  No clubbing or edema. Neurologic:  Alert and oriented x4;  grossly normal neurologically. Skin:  Intact without significant lesions or  rashes. Lymph Nodes:  No significant cervical adenopathy. Psych:  Alert and cooperative. Normal mood and affect.  

## 2011-05-08 NOTE — Patient Instructions (Signed)
Take Prilosec 40 mg 30 minutes before your first meal of the day You need a colonoscopy and EGD with Dr. Jena Gauss Take half of your Glucotrol and hold your Glucophage the day before your procedure and the day of your procedure Bring your diabetes medicines to the hospital

## 2011-05-09 ENCOUNTER — Encounter: Payer: Self-pay | Admitting: Urgent Care

## 2011-05-09 ENCOUNTER — Other Ambulatory Visit: Payer: Self-pay | Admitting: Gastroenterology

## 2011-05-09 DIAGNOSIS — R110 Nausea: Secondary | ICD-10-CM

## 2011-05-09 DIAGNOSIS — R1013 Epigastric pain: Secondary | ICD-10-CM | POA: Insufficient documentation

## 2011-05-09 DIAGNOSIS — D126 Benign neoplasm of colon, unspecified: Secondary | ICD-10-CM | POA: Insufficient documentation

## 2011-05-09 DIAGNOSIS — R1110 Vomiting, unspecified: Secondary | ICD-10-CM

## 2011-05-09 NOTE — Assessment & Plan Note (Signed)
Recheck TSH 

## 2011-05-09 NOTE — Assessment & Plan Note (Addendum)
Recent HIDA scan and abdominal ultrasound negative for gallbladder issues. Suspect uncontrolled GERD, gastritis or peptic disease as culprit.  Take Prilosec 40 mg 30 minutes before your first meal of the day Take half of your Glucotrol and hold your Glucophage the day before your procedure and the day of your procedure Bring your diabetes medicines to the hospital

## 2011-05-09 NOTE — Progress Notes (Signed)
Quick Note:  Please let patient know thyroid bloodwork is normal ______

## 2011-05-09 NOTE — Progress Notes (Signed)
Faxed to PCP

## 2011-05-09 NOTE — Assessment & Plan Note (Signed)
Caitlin Thompson is a pleasant 51 y.o. female six-month history of worsening nausea, vomiting, and epigastric pain. Differentials include poorly controlled GERD, diabetic gastroparesis, gastritis, or peptic ulcer disease.  EGD for further evaluation. If benign, consider gastric imaging study.  I have discussed risks & benefits which include, but are not limited to, bleeding, infection, perforation & drug reaction.  The patient agrees with this plan & written consent will be obtained.

## 2011-05-09 NOTE — Assessment & Plan Note (Signed)
Daily lose stools possibly secondary to side effect from diabetes medications, diabetic diarrhea, microscopic colitis, irritable bowel syndrome, or colo rectal carcinoma. Colonoscopy for further evaluation.

## 2011-05-15 ENCOUNTER — Encounter (HOSPITAL_COMMUNITY): Payer: Self-pay | Admitting: Pharmacy Technician

## 2011-05-16 MED ORDER — SODIUM CHLORIDE 0.45 % IV SOLN
Freq: Once | INTRAVENOUS | Status: AC
  Administered 2011-05-17: 1000 mL via INTRAVENOUS

## 2011-05-17 ENCOUNTER — Other Ambulatory Visit: Payer: Self-pay | Admitting: Gastroenterology

## 2011-05-17 ENCOUNTER — Encounter (HOSPITAL_COMMUNITY): Payer: Self-pay | Admitting: *Deleted

## 2011-05-17 ENCOUNTER — Encounter (HOSPITAL_COMMUNITY)
Admission: RE | Disposition: A | Discharge status: Home / Self Care | Payer: Self-pay | Source: Ambulatory Visit | Attending: Internal Medicine

## 2011-05-17 ENCOUNTER — Ambulatory Visit (HOSPITAL_COMMUNITY)
Admission: RE | Admit: 2011-05-17 | Discharge: 2011-05-17 | Disposition: A | Discharge status: Home / Self Care | Payer: Managed Care, Other (non HMO) | Source: Ambulatory Visit | Attending: Internal Medicine | Admitting: Internal Medicine

## 2011-05-17 DIAGNOSIS — R1310 Dysphagia, unspecified: Secondary | ICD-10-CM | POA: Insufficient documentation

## 2011-05-17 DIAGNOSIS — K222 Esophageal obstruction: Secondary | ICD-10-CM | POA: Insufficient documentation

## 2011-05-17 DIAGNOSIS — I10 Essential (primary) hypertension: Secondary | ICD-10-CM | POA: Insufficient documentation

## 2011-05-17 DIAGNOSIS — Z01812 Encounter for preprocedural laboratory examination: Secondary | ICD-10-CM | POA: Insufficient documentation

## 2011-05-17 DIAGNOSIS — D126 Benign neoplasm of colon, unspecified: Secondary | ICD-10-CM

## 2011-05-17 DIAGNOSIS — R1110 Vomiting, unspecified: Secondary | ICD-10-CM

## 2011-05-17 DIAGNOSIS — R110 Nausea: Secondary | ICD-10-CM

## 2011-05-17 DIAGNOSIS — R197 Diarrhea, unspecified: Secondary | ICD-10-CM | POA: Insufficient documentation

## 2011-05-17 DIAGNOSIS — K219 Gastro-esophageal reflux disease without esophagitis: Secondary | ICD-10-CM

## 2011-05-17 DIAGNOSIS — E119 Type 2 diabetes mellitus without complications: Secondary | ICD-10-CM | POA: Insufficient documentation

## 2011-05-17 DIAGNOSIS — R6881 Early satiety: Secondary | ICD-10-CM

## 2011-05-17 HISTORY — PX: ESOPHAGEAL DILATION: SHX303

## 2011-05-17 HISTORY — PX: COLONOSCOPY W/ POLYPECTOMY: SHX1380

## 2011-05-17 SURGERY — COLONOSCOPY WITH ESOPHAGOGASTRODUODENOSCOPY (EGD)
Anesthesia: Moderate Sedation | Site: Esophagus

## 2011-05-17 MED ORDER — STERILE WATER FOR IRRIGATION IR SOLN
Status: DC | PRN
  Administered 2011-05-17: 11:00:00

## 2011-05-17 MED ORDER — MEPERIDINE HCL 100 MG/ML IJ SOLN
INTRAMUSCULAR | Status: DC | PRN
  Administered 2011-05-17: 50 mg via INTRAVENOUS
  Administered 2011-05-17 (×2): 25 mg via INTRAVENOUS

## 2011-05-17 MED ORDER — MIDAZOLAM HCL 5 MG/5ML IJ SOLN
INTRAMUSCULAR | Status: AC
  Filled 2011-05-17: qty 10

## 2011-05-17 MED ORDER — ONDANSETRON HCL 4 MG/2ML IJ SOLN
4.0000 mg | Freq: Once | INTRAMUSCULAR | Status: AC
  Administered 2011-05-17: 4 mg via INTRAVENOUS

## 2011-05-17 MED ORDER — MIDAZOLAM HCL 5 MG/5ML IJ SOLN
INTRAMUSCULAR | Status: DC | PRN
  Administered 2011-05-17: 1 mg via INTRAVENOUS
  Administered 2011-05-17 (×2): 2 mg via INTRAVENOUS

## 2011-05-17 MED ORDER — ONDANSETRON HCL 4 MG/2ML IJ SOLN
INTRAMUSCULAR | Status: AC
  Administered 2011-05-17: 4 mg via INTRAVENOUS
  Filled 2011-05-17: qty 2

## 2011-05-17 MED ORDER — MEPERIDINE HCL 100 MG/ML IJ SOLN
INTRAMUSCULAR | Status: AC
  Filled 2011-05-17: qty 1

## 2011-05-17 MED ORDER — BUTAMBEN-TETRACAINE-BENZOCAINE 2-2-14 % EX AERO
INHALATION_SPRAY | CUTANEOUS | Status: DC | PRN
  Administered 2011-05-17: 2 via TOPICAL

## 2011-05-17 NOTE — Op Note (Signed)
[facility] [address] [place], [place]  [address]  ENDOSCOPY PROCEDURE REPORT  PATIENT:  Caitlin Thompson, Caitlin Thompson  MR#:  [id number] BIRTHDATE:  01-Oct-1960, 50 yrs. old  GENDER:  female  ENDOSCOPIST:  R. Roetta Sessions, MD Caleen Essex Referred by:  Syliva Overman, M.D.  PROCEDURE DATE:  05/17/2011 PROCEDURE:  EGD with Elease Hashimoto dilation and small bowel biopsy  INDICATIONS:  early satiety; GERD/esophageal dysphagia and diarrhea  INFORMED CONSENT:   The risks, benefits, limitations, alternatives and imponderables have been discussed.  The potential for biopsy, esophogeal dilation, etc. have also been reviewed.  Questions have been answered.  All parties agreeable.  Please see the history and physical in the medical record for more information.  MEDICATIONS:     Versed 4 mg IV and Demerol 75 mg IV in divided doses. Cetacaine spray.  DESCRIPTION OF PROCEDURE:   The EG-2990i ([id number]) endoscope was introduced through the mouth and advanced to the second portion of the duodenum without difficulty or limitations.  The mucosal surfaces were surveyed very carefully during advancement of the scope and upon withdrawal.  Retroflexion view of the proximal stomach and esophagogastric junction was performed.  <<PROCEDUREIMAGES>>  FINDINGS:  Patent tubular esophagus with incomplete noncritical Schatzki's ring; esophageal mucosa appeared otherwise normal. Stomach empty. 3-4 cm hiatal hernia. Gastric mucosa otherwise appeared normal. Patent pylorus appeared normal first second third portion of the                      duodenum  THERAPEUTIC / DIAGNOSTIC MANEUVERS PERFORMED:   A 56 French Maloney dilator was passed fully with ease. A look back revealed no apparent complication related to this maneuver. Subsequently, biopsies of the first, second third portion of duodenum were taken.  COMPLICATIONS:   None  IMPRESSION:               Incomplete noncritical Schatzki's ring status post  dilation as described above. Hiatal hernia. Remainder of exam                                normal. Status post biopsy of small intestine.  RECOMMENDATIONS:      See colonoscopy report.  ______________________________ R. Roetta Sessions, MD Caleen Essex  CC:  n. eSIGNED:   R. Roetta Sessions at 05/17/2011 10:53 AM  Jovita Kussmaul, [phone]

## 2011-05-17 NOTE — Discharge Instructions (Signed)
Colonoscopy Discharge Instructions  Read the instructions outlined below and refer to this sheet in the next few weeks. These discharge instructions provide you with general information on caring for yourself after you leave the hospital. Your doctor may also give you specific instructions. While your treatment has been planned according to the most current medical practices available, unavoidable complications occasionally occur. If you have any problems or questions after discharge, call Dr. Jena Gauss at [phone]. ACTIVITY  You may resume your regular activity, but move at a slower pace for the next 24 hours.   Take frequent rest periods for the next 24 hours.   Walking will help get rid of the air and reduce the bloated feeling in your belly (abdomen).   No driving for 24 hours (because of the medicine (anesthesia) used during the test).    Do not sign any important legal documents or operate any machinery for 24 hours (because of the anesthesia used during the test).  NUTRITION  Drink plenty of fluids.   You may resume your normal diet as instructed by your doctor.   Begin with a light meal and progress to your normal diet. Heavy or fried foods are harder to digest and may make you feel sick to your stomach (nauseated).   Avoid alcoholic beverages for 24 hours or as instructed.  MEDICATIONS  You may resume your normal medications unless your doctor tells you otherwise.  WHAT YOU CAN EXPECT TODAY  Some feelings of bloating in the abdomen.   Passage of more gas than usual.   Spotting of blood in your stool or on the toilet paper.  IF YOU HAD POLYPS REMOVED DURING THE COLONOSCOPY:  No aspirin products for 7 days or as instructed.   No alcohol for 7 days or as instructed.   Eat a soft diet for the next 24 hours.  FINDING OUT THE RESULTS OF YOUR TEST Not all test results are available during your visit. If your test results are not back during the visit, make an appointment  with your caregiver to find out the results. Do not assume everything is normal if you have not heard from your caregiver or the medical facility. It is important for you to follow up on all of your test results.  SEEK IMMEDIATE MEDICAL ATTENTION IF:  You have more than a spotting of blood in your stool.   Your belly is swollen (abdominal distention).   You are nauseated or vomiting.   You have a temperature over 101.   You have abdominal pain or discomfort that is severe or gets worse throughout the day.   EGD Discharge instructions Please read the instructions outlined below and refer to this sheet in the next few weeks. These discharge instructions provide you with general information on caring for yourself after you leave the hospital. Your doctor may also give you specific instructions. While your treatment has been planned according to the most current medical practices available, unavoidable complications occasionally occur. If you have any problems or questions after discharge, please call your doctor. ACTIVITY  You may resume your regular activity but move at a slower pace for the next 24 hours.   Take frequent rest periods for the next 24 hours.   Walking will help expel (get rid of) the air and reduce the bloated feeling in your abdomen.   No driving for 24 hours (because of the anesthesia (medicine) used during the test).   You may shower.   Do not sign any  important legal documents or operate any machinery for 24 hours (because of the anesthesia used during the test).  NUTRITION  Drink plenty of fluids.   You may resume your normal diet.   Begin with a light meal and progress to your normal diet.   Avoid alcoholic beverages for 24 hours or as instructed by your caregiver.  MEDICATIONS  You may resume your normal medications unless your caregiver tells you otherwise.  WHAT YOU CAN EXPECT TODAY  You may experience abdominal discomfort such as a feeling of  fullness or "gas" pains.  FOLLOW-UP  Your doctor will discuss the results of your test with you.  SEEK IMMEDIATE MEDICAL ATTENTION IF ANY OF THE FOLLOWING OCCUR:  Excessive nausea (feeling sick to your stomach) and/or vomiting.   Severe abdominal pain and distention (swelling).   Trouble swallowing.   Temperature over 101 F (37.8 C).   Rectal bleeding or vomiting of blood.   Polyp information provided.  Schedule solid phase gastric emptying study to further evaluate nausea and early satiety in this very nice  [facility]  Further recommendations to follow pending review of pathology report.  Colon Polyps A polyp is extra tissue that grows inside your body. Colon polyps grow in the large intestine. The large intestine, also called the colon, is part of your digestive system. It is a long, hollow tube at the end of your digestive tract where your body makes and stores stool. Most polyps are not dangerous. They are benign. This means they are not cancerous. But over time, some types of polyps can turn into cancer. Polyps that are smaller than a pea are usually not harmful. But larger polyps could someday become or may already be cancerous. To be safe, doctors remove all polyps and test them.  WHO GETS POLYPS? Anyone can get polyps, but certain people are more likely than others. You may have a greater chance of getting polyps if:  You are over 50.   You have had polyps before.   Someone in your family has had polyps.   Someone in your family has had cancer of the large intestine.   Find out if someone in your family has had polyps. You may also be more likely to get polyps if you:   Eat a lot of fatty foods.   Smoke.   Drink alcohol.   Do not exercise.   Eat too much.  SYMPTOMS  Most small polyps do not cause symptoms. People often do not know they have one until their caregiver finds it during a regular checkup or while testing them for something else. Some people do have  symptoms like these:  Bleeding from the anus. You might notice blood on your underwear or on toilet paper after you have had a bowel movement.   Constipation or diarrhea that lasts more than a week.   Blood in the stool. Blood can make stool look black or it can show up as red streaks in the stool.  If you have any of these symptoms, see your caregiver. HOW DOES THE DOCTOR TEST FOR POLYPS? The doctor can use four tests to check for polyps:  Digital rectal exam. The caregiver wears gloves and checks your rectum (the last part of the large intestine) to see if it feels normal. This test would find polyps only in the rectum. Your caregiver may need to do one of the other tests listed below to find polyps higher up in the intestine.   Barium enema. The  caregiver puts a liquid called barium into your rectum before taking x-rays of your large intestine. Barium makes your intestine look white in the pictures. Polyps are dark, so they are easy to see.   Sigmoidoscopy. With this test, the caregiver can see inside your large intestine. A thin flexible tube is placed into your rectum. The device is called a sigmoidoscope, which has a light and a tiny video camera in it. The caregiver uses the sigmoidoscope to look at the last third of your large intestine.   Colonoscopy. This test is like sigmoidoscopy, but the caregiver looks at all of the large intestine. It usually requires sedation. This is the most common method for finding and removing polyps.  TREATMENT   The caregiver will remove the polyp during sigmoidoscopy or colonoscopy. The polyp is then tested for cancer.   If you have had polyps, your caregiver may want you to get tested regularly in the future.  PREVENTION  There is not one sure way to prevent polyps. You might be able to lower your risk of getting them if you:  Eat more fruits and vegetables and less fatty food.   Do not smoke.   Avoid alcohol.   Exercise every day.   Lose  weight if you are overweight.   Eating more calcium and folate can also lower your risk of getting polyps. Some foods that are rich in calcium are milk, cheese, and broccoli. Some foods that are rich in folate are chickpeas, kidney beans, and spinach.   Aspirin might help prevent polyps. Studies are under way.  Document Released: 11/17/2003 Document Revised: 02/09/2011 Document Reviewed: 04/24/2007 [facility] Patient Information 2012 [place], [place].

## 2011-05-17 NOTE — Op Note (Signed)
[facility] [address] [place], [place]  [address]  COLONOSCOPY PROCEDURE REPORT  PATIENT:  Caitlin, Thompson  MR#:  [id number] BIRTHDATE:  Sep 03, 1960, 50 yrs. old  GENDER:  female ENDOSCOPIST:  R. Roetta Sessions, MD FACP [facility] REF. BY:  Syliva Overman, M.D. PROCEDURE DATE:  05/17/2011 PROCEDURE:  colonoscopy with snare polypectomy ,biopsy and stool sampling  INDICATIONS:  chronic diarrhea; first-ever average risk screening colonoscopy  INFORMED CONSENT:  The risks, benefits, alternatives and imponderables including but not limited to bleeding, perforation as well as the possibility of a missed lesion have been reviewed. The potential for biopsy, lesion removal, etc. have also been discussed.  Questions have been answered.  All parties agreeable. Please see the history and physical in the medical record for more information.  MEDICATIONS:  Versed 5 mg IV and Demerol 100 mg IV in divided doses.  DESCRIPTION OF PROCEDURE:  After a digital rectal exam was performed, the EG-2990i ([id number]) and EC-3890Li ([id number]) colonoscope was advanced from the anus through the rectum and colon to the area of the cecum, ileocecal valve and appendiceal orifice.  The cecum was deeply intubated.  These structures were well-seen and photographed for the record.  From the level of the cecum and ileocecal valve, the scope was slowly and cautiously withdrawn.  The mucosal surfaces were carefully surveyed utilizing scope tip deflection to facilitate fold flattening as needed.  The scope was pulled down into the rectum where a thorough examination including retroflexion was performed. <<PROCEDUREIMAGES>>  FINDINGS:   adequate preparation. Anal papilla; otherwise normal rectum. Colonic mucosa appeared entirely normal aside from a 7 mm polyp in the base of the cecum. The distal terminal ileum mucosa appeared normal.  THERAPEUTIC / DIAGNOSTIC MANEUVERS PERFORMED:B. cecal polyp was hot  snare removed. Segmental biopsies of the ascending and descending segments were taken to rule out microscopic colitis. Also, a stool sample was taken to check for lactoferrin, stool culture and C. difficile toxin assay.  COMPLICATIONS:  none  CECAL WITHDRAWAL TIME:12 minutes  IMPRESSION:    Colonic polyp-status post removal as described above. Status post segmental biopsy distal sampling  RECOMMENDATIONS:  Followup on pending studies.  Will proceed with a solid phase gastric emptying study to further evaluate her upper GI tract symptoms.  ______________________________ R. Roetta Sessions, MD Caleen Essex  CC:  Syliva Overman, M.D.  n. eSIGNED:   R. Roetta Sessions at 05/17/2011 11:17 AM  Jovita Kussmaul, [phone]

## 2011-05-17 NOTE — H&P (View-Only) (Signed)
Referring Provider: Syliva Overman, MD Primary Care Physician:  Syliva Overman, MD, MD Primary Gastroenterologist:  Dr. Jena Gauss  Chief Complaint  Patient presents with  . Nausea  . Abdominal Pain   HPI:  Caitlin Thompson is a 51 y.o. female here as a referral from Dr. Lodema Hong for 6 MO HISTORY OF WORSENING NAUSEA AND VOMITING. She notes nausea and vomiting of undigested food within couple hrs of eating.  Denies heartburn but has constant indigestion.  She has been given a trial of PPI, however she admits to not taking meds. Also has Dysphagia where solids and feels stomach in her mid-esophagus.  No problems with liquids.  Appetite has been ok.  +early satiety.  She thought her symptoms were due to a new diabetes med, but stopped this after 3 mo.  she did notice minimal improvement.  Hx DM x 68yrs-blood sugars 160-200's, "can't keep meds down".  Hgb a1c has been high.  H Pylori negative.  C/o Epigastric pain, worse w/ meds/eating. 10/10 at worst. Generally lasts 25 minutes, intermittent.  BM avg 4 per day-loose watery.  Never had a colonoscopy.  No fever +chills.  Has been having headaches and taking rare ibuprofen 200 mg.  Wt stable.  Normal HIDA scan 2/16.  Normal abdominal ultrasound 2/5.  Past Medical History  Diagnosis Date  . Diabetes mellitus type II   . Renal lithiasis 6/11    Hospitalised a [place] fr 3 days in June   . Hematuria   . Hypertension    Surgical History: Denies any  Current Outpatient Prescriptions  Medication Sig Dispense Refill  . amLODipine (NORVASC) 5 MG tablet Take 1 tablet (5 mg total) by mouth daily.  30 tablet  11  . Blood Glucose Monitoring Suppl (RELION CONFIRM GLUCOSE MONITOR) W/DEVICE KIT 1 kit by Does not apply route daily.  1 kit  0  . glipiZIDE (GLUCOTROL) 10 MG tablet Take 1 tablet (10 mg total) by mouth 2 (two) times daily.  60 tablet  11  . glucose blood (RELION GLUCOSE TEST STRIPS) test strip Once daily testing Dx:250.00  100 each  11  .  hydrocortisone 1 % cream Apply topically 2 (two) times daily.        Marland Kitchen lisinopril (PRINIVIL,ZESTRIL) 40 MG tablet TAKE ONE TABLET BY MOUTH EVERY DAY  30 tablet  4  . metFORMIN (GLUCOPHAGE) 500 MG tablet Two tablets twice daily Discontinue janumet effective 04/03/2011  120 tablet  3  . pravastatin (PRAVACHOL) 40 MG tablet Take 1 tablet (40 mg total) by mouth every evening.  30 tablet  11  . ReliOn Lancets MISC Once daily testing Dx:250.00  100 each  11  . peg 3350 powder (MOVIPREP) 100 G SOLR Take 1 kit (100 g total) by mouth once. As directed Please purchase 1 Fleets enema to use with the prep  1 kit  0   Allergies as of 05/08/2011 - Review Complete 05/08/2011  Allergen Reaction Noted  . Codeine    . Sulfonamide derivatives     Family History:There is no known family history of colorectal carcinoma , liver disease, or inflammatory bowel disease.  Problem Relation Age of Onset  . Diabetes Brother     x2  . Crohn's disease Sister   . Aneurysm Sister   . Diabetes Mother   . Diabetes Father   . Hypertension Mother   . Hypertension Father   . Kidney failure Mother   . Kidney failure Father    History  Social History  . Marital Status: Single    Spouse Name: N/A    Number of Children: 1  . Years of Education: N/A   Occupational History  . Fulltime- Technical sales engineer [employer]        .  [employer]   Social History Main Topics  . Smoking status: Never Smoker   . Smokeless tobacco: Not on file  . Alcohol Use: No  . Drug Use: No  . Sexually Active: Yes    Birth Control/ Protection: None   Other Topics Concern  . Not on file   Social History Narrative   1 GROWN DAUGHTER   Review of Systems: Gen: Denies any fever, chills, sweats, anorexia, fatigue, weakness, malaise, weight loss, and sleep disorder CV: Denies chest pain, angina, palpitations, syncope, orthopnea, PND, peripheral edema, and claudication. Resp: Denies dyspnea at rest, dyspnea with  exercise, cough, sputum, wheezing, coughing up blood, and pleurisy. GI: Denies vomiting blood, jaundice, and fecal incontinence.   GU : Denies urinary burning, blood in urine, urinary frequency, urinary hesitancy, nocturnal urination, and urinary incontinence. MS: Denies joint pain, limitation of movement, and swelling, stiffness, low back pain, extremity pain. Denies muscle weakness, cramps, atrophy.  Derm: Denies rash, itching, dry skin, hives, moles, warts, or unhealing ulcers.  Psych: Denies depression, anxiety, memory loss, suicidal ideation, hallucinations, paranoia, and confusion. Heme: Denies bruising, bleeding, and enlarged lymph nodes. Neuro:  Denies any headaches, dizziness, paresthesias. Endo:  Denies any problems with DM, thyroid, adrenal function.  Physical Exam: BP 150/90  Pulse 103  Temp(Src) 98.4 F (36.9 C) (Temporal)  Ht 5\' 3"  (1.6 m)  Wt 193 lb 12.8 oz (87.907 kg)  BMI 34.33 kg/m2  LMP 05/01/2011 General:   Alert,  Well-developed, well-nourished, pleasant and cooperative in NAD Head:  Normocephalic and atraumatic. Eyes:  Sclera clear, no icterus.   Conjunctiva pink. Ears:  Normal auditory acuity. Nose:  No deformity, discharge, or lesions. Mouth:  No deformity or lesions,oropharynx pink & moist. Neck:  Supple; no masses or thyromegaly. Lungs:  Clear throughout to auscultation.   No wheezes, crackles, or rhonchi. No acute distress. Heart:  Regular rate and rhythm; no murmurs, clicks, rubs,  or gallops. Abdomen:  Normal bowel sounds.  No bruits.  Soft and non-distended without masses, hepatosplenomegaly or hernias noted.  + mild epigastric tenderness on palpation.  No guarding or rebound tenderness.   Rectal:  Deferred. Msk:  Symmetrical without gross deformities. Normal posture. Pulses:  Normal pulses noted. Extremities:  No clubbing or edema. Neurologic:  Alert and oriented x4;  grossly normal neurologically. Skin:  Intact without significant lesions or  rashes. Lymph Nodes:  No significant cervical adenopathy. Psych:  Alert and cooperative. Normal mood and affect.

## 2011-05-17 NOTE — Interval H&P Note (Signed)
History and Physical Interval Note:  05/17/2011 10:27 AM  Caitlin Thompson  has presented today for surgery, with the diagnosis of chronic n/v, dysphagia and epigastric pain  The various methods of treatment have been discussed with the patient and family. After consideration of risks, benefits and other options for treatment, the patient has consented to  Procedure(s) (LRB): COLONOSCOPY WITH ESOPHAGOGASTRODUODENOSCOPY (EGD) (N/A) as a surgical intervention .  The patients' history has been reviewed, patient examined, no change in status, stable for surgery.  I have reviewed the patients' chart and labs.  Questions were answered to the patient's satisfaction.     Eula Listen

## 2011-05-18 LAB — FECAL LACTOFERRIN, QUANT: Fecal Lactoferrin: NEGATIVE

## 2011-05-19 ENCOUNTER — Encounter (HOSPITAL_COMMUNITY): Payer: Self-pay | Admitting: Internal Medicine

## 2011-05-19 ENCOUNTER — Encounter (HOSPITAL_COMMUNITY)
Admit: 2011-05-19 | Discharge: 2011-05-19 | Disposition: A | Discharge status: Home / Self Care | Payer: Managed Care, Other (non HMO) | Source: Ambulatory Visit | Attending: Internal Medicine | Admitting: Internal Medicine

## 2011-05-19 DIAGNOSIS — R110 Nausea: Secondary | ICD-10-CM | POA: Insufficient documentation

## 2011-05-19 MED ORDER — TECHNETIUM TC 99M SULFUR COLLOID
2.0000 | Freq: Once | INTRAVENOUS | Status: AC | PRN
  Administered 2011-05-19: 2.06 via ORAL

## 2011-05-21 ENCOUNTER — Encounter: Payer: Self-pay | Admitting: Internal Medicine

## 2011-05-21 LAB — STOOL CULTURE

## 2011-05-22 ENCOUNTER — Encounter: Payer: Self-pay | Admitting: Internal Medicine

## 2011-06-08 ENCOUNTER — Telehealth: Payer: Self-pay | Admitting: Family Medicine

## 2011-06-08 NOTE — Telephone Encounter (Signed)
Med cannot be sent in for bladder infection. Please schedule appt

## 2011-06-23 ENCOUNTER — Encounter: Payer: Self-pay | Admitting: Internal Medicine

## 2011-06-26 ENCOUNTER — Encounter: Payer: Self-pay | Admitting: Urgent Care

## 2011-06-26 ENCOUNTER — Ambulatory Visit (INDEPENDENT_AMBULATORY_CARE_PROVIDER_SITE_OTHER): Payer: Managed Care, Other (non HMO) | Admitting: Urgent Care

## 2011-06-26 VITALS — BP 136/87 | HR 106 | Temp 98.4°F | Ht 63.5 in | Wt 191.0 lb

## 2011-06-26 DIAGNOSIS — K219 Gastro-esophageal reflux disease without esophagitis: Secondary | ICD-10-CM

## 2011-06-26 DIAGNOSIS — D126 Benign neoplasm of colon, unspecified: Secondary | ICD-10-CM

## 2011-06-26 DIAGNOSIS — R197 Diarrhea, unspecified: Secondary | ICD-10-CM

## 2011-06-26 HISTORY — DX: Benign neoplasm of colon, unspecified: D12.6

## 2011-06-26 MED ORDER — OMEPRAZOLE 20 MG PO CPDR: 20.0000 mg | DELAYED_RELEASE_CAPSULE | Freq: Every day | ORAL | Status: DC

## 2011-06-26 NOTE — Patient Instructions (Addendum)
Omeprazole 20 mg daily Colonoscopy March 2018 Office visit in one year or sooner if needed

## 2011-06-26 NOTE — Progress Notes (Signed)
Diarrhea resolved.  Caitlin Thompson.  Primary Care Physician:  Syliva Overman, MD, MD Primary Gastroenterologist:  Dr. Jena Gauss  Chief Complaint  Patient presents with  . Follow-up   HPI:  Caitlin Thompson is a 51 y.o. female here for follow up colonoscopy & EGD.  Hx nausea, indigestion & vomiting.  She was not taking PPI as directed.  EGD showed noncritical Schatski's ring & hiatal hernia.  GES normal.  Nausea & vomiting has resolved.  Not on PPI currently as she was only taking omeprazole prn.  Diarrhea has resolved.  Colonoscopy was normal aside from having a tubular adenoma removed.  Overall, she feels very well. Past Medical History  Diagnosis Date  . Diabetes mellitus type II   . Renal lithiasis 6/11    Hospitalized a [place] fr 3 days in June   . Hematuria   . Hypertension   . Tubular adenoma of colon 3/13  . Schatzki's ring    Surgical History: Denies any  Current Outpatient Prescriptions  Medication Sig Dispense Refill  . amLODipine (NORVASC) 5 MG tablet Take 1 tablet (5 mg total) by mouth daily.  30 tablet  11  . Blood Glucose Monitoring Suppl (RELION CONFIRM GLUCOSE MONITOR) W/DEVICE KIT 1 kit by Does not apply route daily.  1 kit  0  . glipiZIDE (GLUCOTROL) 10 MG tablet Take 1 tablet (10 mg total) by mouth 2 (two) times daily.  60 tablet  11  . glucose blood (RELION GLUCOSE TEST STRIPS) test strip Once daily testing Dx:250.00  100 each  11  . ibuprofen (ADVIL,MOTRIN) 200 MG tablet Take 400 mg by mouth every 6 (six) hours as needed. For pain/fever      . lisinopril (PRINIVIL,ZESTRIL) 40 MG tablet TAKE ONE TABLET BY MOUTH EVERY DAY  30 tablet  4  . metFORMIN (GLUCOPHAGE) 500 MG tablet Take 1,000 mg by mouth 2 (two) times daily.      . pravastatin (PRAVACHOL) 40 MG tablet Take 1 tablet (40 mg total) by mouth every evening.  30 tablet  11  . ReliOn Lancets MISC Once daily testing Dx:250.00  100 each  11  . omeprazole (PRILOSEC) 20 MG capsule Take 1 capsule (20 mg total) by  mouth daily.  31 capsule  11   Allergies as of 06/26/2011 - Review Complete 06/26/2011  Allergen Reaction Noted  . Codeine    . Fish allergy Hives 05/15/2011  . Sulfonamide derivatives Hives   Review of Systems: Caitlin Thompson bilat, otherwise negative ROS  Physical Exam: BP 136/87  Pulse 106  Temp(Src) 98.4 F (36.9 C) (Temporal)  Ht 5' 3.5" (1.613 m)  Wt 191 lb (86.637 kg)  BMI 33.30 kg/m2  LMP 05/25/2011 General:   Alert,  Well-developed, well-nourished, pleasant and cooperative in NAD Eyes:  Sclera clear, no icterus.   Conjunctiva pink. Mouth:  No deformity or lesions,oropharynx pink & moist. Neck:  Supple; no masses or thyromegaly. Heart:  Regular rate and rhythm; no murmurs, clicks, rubs,  or gallops. Abdomen:  Normal bowel sounds.  No bruits.  Soft, non-tender and non-distended without masses, hepatosplenomegaly or hernias noted.  No guarding or rebound tenderness.   Msk:  Symmetrical without gross deformities. Normal posture. Pulses:  Normal pulses noted. Extremities:  No clubbing or edema. Neurologic:  Alert and oriented x4;  grossly normal neurologically. Skin:  Intact without significant lesions or rashes. Lymph Nodes:  No significant cervical adenopathy. Psych:  Alert and cooperative. Normal mood and affect.

## 2011-06-27 ENCOUNTER — Encounter: Payer: Self-pay | Admitting: Urgent Care

## 2011-06-27 NOTE — Assessment & Plan Note (Signed)
Omeprazole 20mg  daily-discussed risks v. Benefits OV 1 yr or sooner if needed

## 2011-06-27 NOTE — Assessment & Plan Note (Signed)
Resolved

## 2011-06-28 NOTE — Progress Notes (Signed)
Faxed to PCP

## 2011-07-05 ENCOUNTER — Ambulatory Visit: Payer: Managed Care, Other (non HMO) | Admitting: Family Medicine

## 2011-07-14 ENCOUNTER — Ambulatory Visit (INDEPENDENT_AMBULATORY_CARE_PROVIDER_SITE_OTHER): Payer: Managed Care, Other (non HMO) | Admitting: Family Medicine

## 2011-07-14 ENCOUNTER — Encounter: Payer: Self-pay | Admitting: Family Medicine

## 2011-07-14 VITALS — BP 140/90 | HR 100 | Resp 15 | Ht 63.5 in | Wt 191.0 lb

## 2011-07-14 DIAGNOSIS — I10 Essential (primary) hypertension: Secondary | ICD-10-CM

## 2011-07-14 DIAGNOSIS — E669 Obesity, unspecified: Secondary | ICD-10-CM

## 2011-07-14 DIAGNOSIS — E785 Hyperlipidemia, unspecified: Secondary | ICD-10-CM

## 2011-07-14 DIAGNOSIS — E119 Type 2 diabetes mellitus without complications: Secondary | ICD-10-CM

## 2011-07-14 NOTE — Progress Notes (Signed)
  Subjective:    Patient ID: Caitlin Thompson, female    DOB: 07-Jun-1960, 51 y.o.   MRN: [id number]  HPI The PT is here for follow up and re-evaluation of chronic medical conditions, medication management and review of any available recent lab and radiology data.  Preventive health is updated, specifically  Cancer screening and Immunization.   Questions or concerns regarding consultations or procedures which the PT has had in the interim are  Addressed.Has been evaluated by GI for chronic nausea which has improved, but is still present.at times.  The PT denies any adverse reactions to current medications since the last visit.  There are no new concerns.  There are no specific complaints. Is aware that blood sugar control has worsened, is still resistant absolutely to insulin, states she will work on weight loss and regular exercise       Review of Systems See HPI Denies recent fever or chills. Denies sinus pressure, nasal congestion, ear pain or sore throat. Denies chest congestion, productive cough or wheezing. Denies chest pains, palpitations and c/o ankle swelling at the end of the day which goes down overnight. H/o trauma ot left ankle, swelling more marked in left ankle Denies abdominal pain, vomiting,diarrhea or constipation.   Denies dysuria, frequency, hesitancy or incontinence. Denies joint pain, swelling and limitation in mobility. Denies headaches, seizures, numbness, or tingling. Denies depression, anxiety or insomnia. Denies skin break down or rash.        Objective:   Physical Exam Patient alert and oriented and in no cardiopulmonary distress.  HEENT: No facial asymmetry, EOMI, no sinus tenderness,  oropharynx pink and moist.  Neck supple no adenopathy.  Chest: Clear to auscultation bilaterally.  CVS: S1, S2 no murmurs, no S3.  ABD: Soft non tender. Bowel sounds normal.  Ext: No edema  MS: Adequate ROM spine, shoulders, hips and knees.mild deformity of  left ankle  Skin: Intact, no ulcerations or rash noted.  Psych: Good eye contact, normal affect. Memory intact not anxious or depressed appearing.  CNS: CN 2-12 intact, power, tone and sensation normal throughout.        Assessment & Plan:

## 2011-07-14 NOTE — Patient Instructions (Signed)
F/U in 4 month  You are accountable for your health  HBA1C June 5 or by the 10th  Commit to 60 minutes every day of exercise.  Commit to 1200 to max of 1500 cal diet daily Commit to weight loss.  These commitments will improve your health, espescialy blood sugars and blood pressure    HBA1C, fasting lipid, cmp and eGFR in 4 month

## 2011-07-15 DIAGNOSIS — E66811 Obesity, class 1: Secondary | ICD-10-CM

## 2011-07-15 DIAGNOSIS — E669 Obesity, unspecified: Secondary | ICD-10-CM | POA: Insufficient documentation

## 2011-07-15 HISTORY — DX: Obesity, class 1: E66.811

## 2011-07-15 HISTORY — DX: Obesity, unspecified: E66.9

## 2011-07-15 NOTE — Assessment & Plan Note (Addendum)
Slight improvement, 2 pound weight loss Patient re-educated about  the importance of commitment to a  minimum of 150 minutes of exercise per week. The importance of healthy food choices with portion control discussed. Encouraged to start a food diary, count calories and to consider  joining a support group. Sample diet sheets offered. Goals set by the patient for the next several months.

## 2011-07-15 NOTE — Assessment & Plan Note (Signed)
Hyperlipidemia:Low fat diet discussed and encouraged.  Updated labs due at next visit

## 2011-07-15 NOTE — Assessment & Plan Note (Signed)
Deteriorated, recent hBA1C from outside lab reportedly 8.2. Pt continues to resist insulin, states "when you start insulin, it's all over" States she will have the conversation in 3 months , again, and will work on exercise and weight loss

## 2011-07-15 NOTE — Assessment & Plan Note (Signed)
Uncontrolled , no med change at this time 

## 2011-08-18 LAB — COMPLETE METABOLIC PANEL WITH GFR
ALT: 11 U/L (ref 0–35)
Albumin: 4.3 g/dL (ref 3.5–5.2)
CO2: 21 mEq/L (ref 19–32)
Calcium: 10.2 mg/dL (ref 8.4–10.5)
Chloride: 103 mEq/L (ref 96–112)
Creat: 0.82 mg/dL (ref 0.50–1.10)
GFR, Est African American: >89 mL/min

## 2011-08-18 LAB — LIPID PANEL
Cholesterol: 180 mg/dL (ref 0–200)
LDL Cholesterol: 91 mg/dL (ref 0–99)
Triglycerides: 181 mg/dL — ABNORMAL HIGH (ref <150)

## 2011-08-18 LAB — HEMOGLOBIN A1C
Hgb A1c MFr Bld: 7.8 % — ABNORMAL HIGH (ref <5.7)
Mean Plasma Glucose: 180 mg/dL — ABNORMAL HIGH (ref <117)

## 2011-08-30 ENCOUNTER — Other Ambulatory Visit: Payer: Self-pay | Admitting: Family Medicine

## 2011-09-07 ENCOUNTER — Other Ambulatory Visit: Payer: Self-pay | Admitting: Family Medicine

## 2011-09-22 ENCOUNTER — Other Ambulatory Visit: Payer: Self-pay | Admitting: Family Medicine

## 2011-09-29 ENCOUNTER — Other Ambulatory Visit: Payer: Self-pay | Admitting: Family Medicine

## 2011-10-09 ENCOUNTER — Telehealth: Payer: Self-pay | Admitting: Family Medicine

## 2011-10-09 ENCOUNTER — Encounter: Payer: Self-pay | Admitting: Family Medicine

## 2011-10-09 ENCOUNTER — Ambulatory Visit (INDEPENDENT_AMBULATORY_CARE_PROVIDER_SITE_OTHER): Payer: Managed Care, Other (non HMO) | Admitting: Family Medicine

## 2011-10-09 VITALS — BP 140/76 | HR 102 | Resp 18 | Ht 63.5 in | Wt 191.0 lb

## 2011-10-09 DIAGNOSIS — L509 Urticaria, unspecified: Secondary | ICD-10-CM

## 2011-10-09 DIAGNOSIS — E119 Type 2 diabetes mellitus without complications: Secondary | ICD-10-CM

## 2011-10-09 DIAGNOSIS — I10 Essential (primary) hypertension: Secondary | ICD-10-CM

## 2011-10-09 MED ORDER — PREDNISONE (PAK) 5 MG PO TABS: 5.0000 mg | ORAL_TABLET | ORAL | Status: DC

## 2011-10-09 MED ORDER — DIPHENHYDRAMINE HCL 50 MG/ML IJ SOLN
25.0000 mg | Freq: Once | INTRAMUSCULAR | Status: AC
  Administered 2011-10-09: 25 mg via INTRAMUSCULAR

## 2011-10-09 MED ORDER — METHYLPREDNISOLONE ACETATE 80 MG/ML IJ SUSP
80.0000 mg | Freq: Once | INTRAMUSCULAR | Status: AC
  Administered 2011-10-09: 80 mg via INTRAMUSCULAR

## 2011-10-09 NOTE — Telephone Encounter (Signed)
Coming in today for injections for rash

## 2011-10-09 NOTE — Patient Instructions (Signed)
F/u as before.Pls get lab work before follow up  AVOID penicillin please  You will get benadryl injection in the office, and depo medrol 80 mg im, zyrtec is very good for urticaria, this is over the counter, take one daily  Please call for someone to take you home ,as  benadryl has a sedative side effect   Prednisone is sent in , take as directed for 6 days. Use OTC benadryl 25mg  one every 6 to 8 hours, to reduce scratching, and therefore risk of skin infection

## 2011-10-10 NOTE — Assessment & Plan Note (Signed)
From history appears to be penicillin induced. Benadryl and depo medrol in office followed by prednisone dose pack

## 2011-10-10 NOTE — Progress Notes (Signed)
  Subjective:    Patient ID: Caitlin Thompson, female    DOB: 13-Jun-1960, 51 y.o.   MRN: [id number]  HPI Pt had dental work this past week, with extraction of teeth. She had been on a course of penicillin pre operatively, and this was repeated immediately post the procedure, last Friday. She states the following day she developed itching and a rash which is gradually worsening, involving trunk and upper and lower extremities.States she stopped the penicillin, and has been informed by the dentist that she "should not need " anymore antibiotic coverage Only other med she has been on is ibuprofen. Denies wheezing or difficulty breathing. Reports improved blood sugars   Review of Systems See HPI Denies recent fever or chills. Denies sinus pressure, nasal congestion, ear pain or sore throat. Denies chest congestion, productive cough or wheezing. Denies chest pains, palpitations and leg swelling Denies abdominal pain, nausea, vomiting,diarrhea or constipation.   Denies dysuria or frequency       Objective:   Physical Exam Patient alert and oriented and in no cardiopulmonary distress.  HEENT: No facial asymmetry, EOMI, no sinus tenderness,  oropharynx pink and moist.  Neck supple no adenopathy.No oropharyngeal obstruction  Chest: Clear to auscultation bilaterally.  CVS: S1, S2 no murmurs, no S3.  ABD: Soft non tender. Bowel sounds normal.  Ext: No edema  MS: Adequate ROM spine, shoulders, hips and knees.  Skin: Intact, urticaria noted involving trunk and extremities        Assessment & Plan:

## 2011-10-10 NOTE — Assessment & Plan Note (Signed)
Reports improved blood sugars, though states am sugars are at times 140, depending on what she eats, will await rept labwork

## 2011-10-10 NOTE — Assessment & Plan Note (Signed)
Inadequate control, no med change at this visit  DASH diet and commitment to daily physical activity for a minimum of 30 minutes discussed and encouraged, as a part of hypertension management. The importance of attaining a healthy weight is also discussed.

## 2011-10-13 ENCOUNTER — Telehealth: Payer: Self-pay | Admitting: Family Medicine

## 2011-10-13 MED ORDER — HYDROXYZINE HCL 25 MG PO TABS: 25.0000 mg | ORAL_TABLET | Freq: Three times a day (TID) | ORAL | Status: AC | PRN

## 2011-10-13 MED ORDER — PROMETHAZINE HCL 12.5 MG PO TABS: 12.5000 mg | ORAL_TABLET | Freq: Three times a day (TID) | ORAL | Status: DC | PRN

## 2011-10-13 NOTE — Telephone Encounter (Signed)
I advise hyroxyzine , will enter the script will make her slleepy, I also suggest med for nausea. Prednisone is the std treatment for hives, she should also use zyrtec one daily Pls erx if she agrees hydroxyzine 25mg  one 3 times daily as needed #30 no refill and phenergan 12.5mg  one 3 times daily as needed for nausea #20 no refill

## 2011-10-13 NOTE — Telephone Encounter (Signed)
Pt states that she cannot tolerate the prednisone and threw up the medicine after taking and stopped after 3 days.  Still has the hives and is having a lot of problems with itching.

## 2011-10-16 ENCOUNTER — Other Ambulatory Visit: Payer: Self-pay | Admitting: Family Medicine

## 2011-10-16 ENCOUNTER — Telehealth: Payer: Self-pay | Admitting: Family Medicine

## 2011-10-16 MED ORDER — ACYCLOVIR 800 MG PO TABS: ORAL_TABLET | ORAL | Status: DC

## 2011-10-16 NOTE — Telephone Encounter (Signed)
Spoke with Dr Lodema Hong

## 2011-10-16 NOTE — Telephone Encounter (Signed)
meds sent

## 2011-10-16 NOTE — Telephone Encounter (Signed)
Does she need anything else to be sent in?

## 2011-10-16 NOTE — Telephone Encounter (Signed)
Noted and pt aware 

## 2011-10-16 NOTE — Telephone Encounter (Signed)
I spoke with the pt, states she was told indirectly at an urgenty care sounds as though she has chicken pox, she was also sent home from work because of rash which she states has sprad a lot to involve many areas over her body, some looking like blisters.  Needs work note to be out for extended time and also med for the rash. Please send in    Type work excuse for her starting today to return 10/26/2011.pls also ask her to come by in the morning at 8am so that I can see the rash Also I am sending in med to her pharmacy for her

## 2011-10-16 NOTE — Telephone Encounter (Signed)
Based on my clinical exam findings I would not prescribe any other medication for her. I have tried calling her to get more info, no answer, if you think it necessary, you may get me to speak with her when you return her call. Thanks

## 2011-10-17 ENCOUNTER — Other Ambulatory Visit: Payer: Self-pay | Admitting: Family Medicine

## 2011-10-17 ENCOUNTER — Telehealth: Payer: Self-pay

## 2011-10-17 DIAGNOSIS — R5381 Other malaise: Secondary | ICD-10-CM

## 2011-10-17 DIAGNOSIS — R21 Rash and other nonspecific skin eruption: Secondary | ICD-10-CM

## 2011-10-17 DIAGNOSIS — E119 Type 2 diabetes mellitus without complications: Secondary | ICD-10-CM

## 2011-10-17 NOTE — Telephone Encounter (Signed)
Pt has appt with amber pa at 10:30 this morning on 10/17/2011. Pt aware

## 2011-10-17 NOTE — Telephone Encounter (Signed)
Lab ordered.

## 2011-10-17 NOTE — Telephone Encounter (Signed)
Pt with blistering rash in crops over body, worse over the weekend, was sent home from work on 8/12, presumed chicken pox which she has already had as a child. Will request derm confirmation, I saw rash today. States she needs work excuse for 2 weeks or else will not be paid, i had taken her out initially till 8/22, will f/u on this through derm  Please schedule appt with dermatology asap, I am entering referral

## 2011-10-20 ENCOUNTER — Telehealth: Payer: Self-pay | Admitting: Family Medicine

## 2011-10-20 MED ORDER — ACYCLOVIR 800 MG PO TABS: ORAL_TABLET | ORAL | Status: AC

## 2011-10-20 NOTE — Telephone Encounter (Signed)
Spoke with pt, she states job has her out till 11/07/2011 reportedly. I have still been unable to obtain dermatology report, states she was told that if she continues to have blisters to return for a biopsy. I advised the pt to take a repeat course of the acyclovir after the initial, since she reports having the problem start since 10/06/2011. She asked if I was sure that it was chicken pox, I again stated that I was not sure , and that was why I had a dermatology evaluation also. I advised she needs to return for follow up on 8/30 in this office and that if there was deterioration in her condition she should return to the dermatologist for re evaluation. She understands the plan

## 2011-10-20 NOTE — Telephone Encounter (Signed)
Dr. Simpson called pt.

## 2011-10-31 ENCOUNTER — Other Ambulatory Visit: Payer: Self-pay | Admitting: Family Medicine

## 2011-11-03 ENCOUNTER — Encounter: Payer: Self-pay | Admitting: Family Medicine

## 2011-11-03 ENCOUNTER — Ambulatory Visit (INDEPENDENT_AMBULATORY_CARE_PROVIDER_SITE_OTHER): Payer: Managed Care, Other (non HMO) | Admitting: Family Medicine

## 2011-11-03 VITALS — BP 140/92 | HR 110 | Resp 16 | Ht 63.5 in | Wt 187.0 lb

## 2011-11-03 DIAGNOSIS — R21 Rash and other nonspecific skin eruption: Secondary | ICD-10-CM

## 2011-11-03 DIAGNOSIS — I10 Essential (primary) hypertension: Secondary | ICD-10-CM

## 2011-11-03 DIAGNOSIS — E785 Hyperlipidemia, unspecified: Secondary | ICD-10-CM

## 2011-11-03 DIAGNOSIS — E119 Type 2 diabetes mellitus without complications: Secondary | ICD-10-CM

## 2011-11-03 MED ORDER — AMLODIPINE BESYLATE 10 MG PO TABS: 10.0000 mg | ORAL_TABLET | Freq: Every day | ORAL | Status: DC

## 2011-11-03 NOTE — Patient Instructions (Addendum)
F/u end September or early October, cancel mid September appointment please.  Increase amlodipine to 10mg  daily, blood pressure is high, OK to take TWO 5mg  tablets once  daily, till done  You are referred to dermatology at [facility] for 2nd opinion on skin rash  Labs 5 days before next visit please  Return to work on 11/07/2011  It is important that you exercise regularly at least 30 minutes 5 times a week. If you develop chest pain, have severe difficulty breathing, or feel very tired, stop exercising immediately and seek medical attention   A healthy diet is rich in fruit, vegetables and whole grains. Poultry fish, nuts and beans are a healthy choice for protein rather then red meat. A low sodium diet and drinking 64 ounces of water daily is generally recommended. Oils and sweet should be limited. Carbohydrates especially for those who are diabetic or overweight, should be limited to 30-45 gram per meal. It is important to eat on a regular schedule, at least 3 times daily. Snacks should be primarily fruits, vegetables or nuts.

## 2011-11-05 NOTE — Assessment & Plan Note (Signed)
Uncontrolled , not at goal, pt intolerant of januvia, resistant to additional meds at  This time, states when next HBa1C ,if still high, she will start additional med  Patient advised to reduce carb and sweets, commit to regular physical activity, take meds as prescribed, test blood as directed, and attempt to lose weight, to improve blood sugar control.

## 2011-11-05 NOTE — Assessment & Plan Note (Signed)
Uncontrolled, not at goal, resists additional med , hopefully at next visit, she will be convinced of need to use additional med, invokana is a good option

## 2011-11-05 NOTE — Progress Notes (Signed)
  Subjective:    Patient ID: Caitlin Thompson, female    DOB: 12-22-1960, 51 y.o.   MRN: [id number]  HPI Pt here primarily for f/u atypical pruritic rash, treated as chicken pox which kept her out of work for approx 4 weeks. Dermatology evaluation , non specific, will request 2nd opinion, she had severe chicken pox in childhood, the rash which involved lower extremities, abdomen and back has no typical distribution of zoster and is hyperpigmented,had vesicles at one time.Never had purulent drainage, no current fever or chills, did have malaise  Blood sugars remain uncontrolled with marked fluctuations   Review of Systems See HPI Denies recent fever or chills. Denies sinus pressure, nasal congestion, ear pain or sore throat. Denies chest congestion, productive cough or wheezing. Denies chest pains, palpitations and leg swelling Denies abdominal pain, nausea, vomiting,diarrhea or constipation.   Denies dysuria, frequency, hesitancy or incontinence. Denies joint pain, swelling and limitation in mobility. Denies headaches, seizures, numbness, or tingling. Denies depression, anxiety or insomnia.        Objective:   Physical Exam Patient alert and oriented and in no cardiopulmonary distress.  HEENT: No facial asymmetry, EOMI, no sinus tenderness,  oropharynx pink and moist.  Neck supple no adenopathy.  Chest: Clear to auscultation bilaterally.  CVS: S1, S2 no murmurs, no S3.  ABD: Soft non tender. Bowel sounds normal.  Ext: No edema  MS: Adequate ROM spine, shoulders, hips and knees.  Skin: Intact, hyperpigmented annular lesions on lower extremities and trunk, no vesicles or open lesions noted  Psych: Good eye contact, normal affect. Memory intact not anxious or depressed appearing.  CNS: CN 2-12 intact, power, tone and sensation normal throughout.        Assessment & Plan:

## 2011-11-05 NOTE — Assessment & Plan Note (Signed)
Disabling rash x 4 weeks, unclear of diagnoses, treated as chicken pox, but due to atypical nature, second dermatology opinion at tertiary center is being requested, return to work note provided

## 2011-11-05 NOTE — Assessment & Plan Note (Signed)
Elevated triglycerides, low fat diet encouraged, no med change

## 2011-11-09 ENCOUNTER — Telehealth: Payer: Self-pay | Admitting: Family Medicine

## 2011-11-09 NOTE — Telephone Encounter (Signed)
Refill x 1 and let her know

## 2011-11-09 NOTE — Telephone Encounter (Signed)
Can she get a refill?  

## 2011-11-10 ENCOUNTER — Telehealth: Payer: Self-pay | Admitting: Family Medicine

## 2011-11-10 MED ORDER — PREDNISONE 2.5 MG PO TABS: ORAL_TABLET | ORAL | Status: DC

## 2011-11-10 MED ORDER — CLONIDINE HCL 0.1 MG PO TABS: ORAL_TABLET | ORAL | Status: DC

## 2011-11-10 MED ORDER — HYDROXYZINE HCL 25 MG PO TABS: 25.0000 mg | ORAL_TABLET | Freq: Three times a day (TID) | ORAL | Status: AC | PRN

## 2011-11-10 NOTE — Telephone Encounter (Signed)
Spoke with pt, no new blisters, just intense itching , espescially in thigh area, requests vistaril refill, will also do

## 2011-11-10 NOTE — Telephone Encounter (Signed)
Will send in prednisone one daily as  For 10 days , hydroxyzine , and new d for additional blood pressure control is clonidine

## 2011-11-13 ENCOUNTER — Telehealth: Payer: Self-pay | Admitting: Family Medicine

## 2011-11-14 ENCOUNTER — Ambulatory Visit: Payer: Managed Care, Other (non HMO) | Admitting: Family Medicine

## 2011-11-15 NOTE — Telephone Encounter (Signed)
Left message and stated that she would need to make appt to come in to have the area of concern evaluated by a physician

## 2011-11-21 ENCOUNTER — Encounter (INDEPENDENT_AMBULATORY_CARE_PROVIDER_SITE_OTHER): Payer: Self-pay | Admitting: Surgery

## 2011-11-21 ENCOUNTER — Ambulatory Visit (INDEPENDENT_AMBULATORY_CARE_PROVIDER_SITE_OTHER): Payer: Private Health Insurance - Indemnity | Admitting: Surgery

## 2011-11-21 VITALS — BP 122/84 | HR 72 | Temp 97.8°F | Resp 14 | Ht 63.5 in | Wt 192.8 lb

## 2011-11-21 DIAGNOSIS — L02219 Cutaneous abscess of trunk, unspecified: Secondary | ICD-10-CM

## 2011-11-21 DIAGNOSIS — N6089 Other benign mammary dysplasias of unspecified breast: Secondary | ICD-10-CM | POA: Insufficient documentation

## 2011-11-21 DIAGNOSIS — L03319 Cellulitis of trunk, unspecified: Secondary | ICD-10-CM

## 2011-11-21 NOTE — Progress Notes (Signed)
[facility]  Ovidio Kin, MD,  FACS [address]  [address] [place], [place]    [address] Phone:  [phone] FAX:  [phone]   Re:   Caitlin Thompson DOB:   1945-10-12 MRN:   [id number]  Urgent Office  ASSESSMENT AND PLAN: 1.  Infected sebaceous cyst, medial left breast (3 o'clock) at sternum  I&D in office.  To change dressing/clean 2 to 3 times per day.  Remove packing tomorrow.  She did not want pain meds and I did not think that she needs antibiotics.  She will see me back in 1 to 2 weeks to check incision.  I gave her literature on sebaceous cysts.  2.  Diabetes mellitus. 3.  Hypertension 4.  Allergic reaction related to recent oral surgery  Took brief course of steroids. 5.  Pruritis from allergic reaction. 6.  HIstory of nephrolithiasis. 7.  Morbid obesity.  HISTORY OF PRESENT ILLNESS: Caitlin Thompson is a 51 y.o. (DOB: Mar 29, 1960)  AA female who is a patient of Syliva Overman, MD and comes to me today for infected cyst of left breast/sternum.  She [facility] on [address] today and sent to our office.  She had noticed a cyst along her sternum on the left side for years.  But it had been small and had not bothered her.  Then over the last 2 weeks, the cyst had enlarged in size and become painful.  She has had no fever.  She had oral surgery on 10/06/2011 at [facility] (she cannot remember the name of the surgeon) in [facility].  She developed a rash post op, which at first was thought to be chicken pox, but now they think was an allergic rxn.  She was on a brief course of steroids.  And she received some antibiotics for the oral surgery.  She had mammograms about 4 months ago which were negative.  Current Outpatient Prescriptions  Medication Sig Dispense Refill  . amLODipine (NORVASC) 10 MG tablet Take 1 tablet (10 mg total) by mouth daily.  30 tablet  11  . Blood Glucose Monitoring Suppl (RELION CONFIRM  GLUCOSE MONITOR) W/DEVICE KIT 1 kit by Does not apply route daily.  1 kit  0  . cloNIDine (CATAPRES) 0.1 MG tablet One tablet once daily at bedtime for blood pressure  30 tablet  11  . glipiZIDE (GLUCOTROL) 10 MG tablet TAKE ONE TABLET BY MOUTH TWICE DAILY  60 tablet  6  . ibuprofen (ADVIL,MOTRIN) 200 MG tablet Take 400 mg by mouth every 6 (six) hours as needed. For pain/fever      . lisinopril (PRINIVIL,ZESTRIL) 40 MG tablet TAKE ONE TABLET BY MOUTH EVERY DAY  30 tablet  3  . metFORMIN (GLUCOPHAGE) 500 MG tablet Take 1,000 mg by mouth 2 (two) times daily.      Marland Kitchen omeprazole (PRILOSEC) 20 MG capsule Take 1 capsule (20 mg total) by mouth daily.  31 capsule  11  . pravastatin (PRAVACHOL) 40 MG tablet TAKE ONE TABLET BY MOUTH EVERY EVENING  30 tablet  2  . predniSONE (DELTASONE) 2.5 MG tablet One tablet once daily, as needed, for intense itching  30 tablet  0  . ReliOn Lancets MISC Once daily testing Dx:250.00  100 each  11  . hydrOXYzine (ATARAX/VISTARIL) 25 MG tablet Take 1 tablet (25 mg total) by mouth 3 (three) times daily as needed for itching.  30 tablet  1  . promethazine (  PHENERGAN) 12.5 MG tablet Take 1 tablet (12.5 mg total) by mouth every 8 (eight) hours as needed for nausea.  20 tablet  0   Social History: Comes by self.  Lives in her parents house in [place] (they are both deceased). 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   PHYSICAL EXAM: BP 122/84  Pulse 72  Temp 97.8 F (36.6 C)  Resp 14  Ht 5' 3.5" (1.613 m)  Wt 192 lb 12.8 oz (87.454 kg)  BMI 33.62 kg/m2  Breast/sternum:  No obvious breast mass.  3 x 4 cm mass/inflammation over sternum and medial left breast.  Procedure:  Chest painted with betadine.  Infiltrated 8 cc 1% xylocaine.  I did an I&D of the sebaceous cyst abscess.  Packed with gauze.  DATA REVIEWED: None.  Ovidio Kin, MD, FACS Office:  [phone]

## 2011-11-29 LAB — CBC WITH DIFFERENTIAL/PLATELET
Basophils Absolute: 0 10*3/uL (ref 0.0–0.1)
Basophils Relative: 0 % (ref 0–1)
Eosinophils Absolute: 0.2 10*3/uL (ref 0.0–0.7)
Lymphs Abs: 3.9 10*3/uL (ref 0.7–4.0)
MCH: 31.6 pg (ref 26.0–34.0)
MCHC: 34.6 g/dL (ref 30.0–36.0)
Neutrophils Relative %: 54 % (ref 43–77)
Platelets: 420 10*3/uL — ABNORMAL HIGH (ref 150–400)
RBC: 3.67 MIL/uL — ABNORMAL LOW (ref 3.87–5.11)
RDW: 12.5 % (ref 11.5–15.5)

## 2011-11-29 LAB — BASIC METABOLIC PANEL
BUN: 10 mg/dL (ref 6–23)
Calcium: 9.7 mg/dL (ref 8.4–10.5)
Creat: 0.67 mg/dL (ref 0.50–1.10)

## 2011-11-29 LAB — HEMOGLOBIN A1C: Hgb A1c MFr Bld: 8 % — ABNORMAL HIGH (ref <5.7)

## 2011-12-05 ENCOUNTER — Encounter: Payer: Self-pay | Admitting: Family Medicine

## 2011-12-05 ENCOUNTER — Ambulatory Visit (INDEPENDENT_AMBULATORY_CARE_PROVIDER_SITE_OTHER): Payer: Private Health Insurance - Indemnity | Admitting: Family Medicine

## 2011-12-05 VITALS — BP 142/80 | HR 91 | Resp 18 | Ht 63.5 in | Wt 193.0 lb

## 2011-12-05 DIAGNOSIS — I10 Essential (primary) hypertension: Secondary | ICD-10-CM

## 2011-12-05 DIAGNOSIS — R21 Rash and other nonspecific skin eruption: Secondary | ICD-10-CM

## 2011-12-05 DIAGNOSIS — E119 Type 2 diabetes mellitus without complications: Secondary | ICD-10-CM

## 2011-12-05 DIAGNOSIS — L509 Urticaria, unspecified: Secondary | ICD-10-CM

## 2011-12-05 DIAGNOSIS — R0789 Other chest pain: Secondary | ICD-10-CM

## 2011-12-05 DIAGNOSIS — E785 Hyperlipidemia, unspecified: Secondary | ICD-10-CM

## 2011-12-05 NOTE — Progress Notes (Signed)
  Subjective:    Patient ID: Caitlin Thompson, female    DOB: 10/05/1960, 51 y.o.   MRN: [id number]  HPI 2  Weeks ago pt started experiencing a "new" outbreak of rash which itches excessively on entire body, upper and lower extremities, , trunk and abdomen  Perineal area also no pus  Patient's first symptoms started 08/2 one day after receiving anaesthesia for dentall work , these were fentanyl, versed and glycopyrrolate.States she had improved some but for no known reason the flare has started again. 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 H/o sulfa allergy, so I will stop the glipizide   Review of Systems See HPI Denies recent fever or chills. Denies sinus pressure, nasal congestion, ear pain or sore throat. Denies chest congestion, productive cough or wheezing. Intermittent chest pains x 2 weeks, aggravated by emotional stress, sometimes accompanied by light headedness, denies palpitations and leg swelling Denies abdominal pain, nausea, vomiting,diarrhea or constipation.   Denies dysuria, frequency, hesitancy or incontinence. Denies joint pain, swelling and limitation in mobility. Denies headaches, seizures, numbness, or tingling.       Objective:   Physical Exam Patient alert and oriented and in no cardiopulmonary distress.Stressed, depressed, tearful and anxious  HEENT: No facial asymmetry, EOMI, no sinus tenderness,  oropharynx pink and moist.  Neck supple no adenopathy.  Chest: Clear to auscultation bilaterally.No chest wall tenderness. EKG: NSR, no ischemic changes  CVS: S1, S2 no murmurs, no S3.  ABD: Soft non tender. Bowel sounds normal.  Ext: No edema  MS: Adequate ROM spine, shoulders, hips and knees.  Skin:  Multiple hyperpigmented annular lesions on trunk and extremities, no drainage or erythema. Macular Psych: Good eye contact, flatl affect. Memory intact  anxious tearful at times and  depressed appearing.  CNS: CN 2-12 intact, power, tone and sensation normal throughout.        Assessment & Plan:

## 2011-12-05 NOTE — Patient Instructions (Addendum)
F/u first week in November.Please call if you need me before  I will attempt to have Dr Margo Aye re evaluate you in the next 2 days.  Keep appt at [facility] for 10/11, and call daily to see if you can be seen earlier, we will provide you with the number  EKG in office today.This is normal;  Benadryl 50mg  IM in office today for itch, pls call your daughter to collect you.  Since you report a sulfa allergy, you need to stop glipizide, try and count carbs carefully, you will need another medication for your blood sugar in place of the glipizide, will follow up on that after you see dermatology.  You may need to see a diabetes specialist, because of the complexity of your situation with a rash of unknown cause which may be related to medication

## 2011-12-06 ENCOUNTER — Telehealth: Payer: Self-pay | Admitting: Family Medicine

## 2011-12-06 ENCOUNTER — Ambulatory Visit (INDEPENDENT_AMBULATORY_CARE_PROVIDER_SITE_OTHER): Payer: Private Health Insurance - Indemnity | Admitting: Surgery

## 2011-12-06 ENCOUNTER — Encounter (INDEPENDENT_AMBULATORY_CARE_PROVIDER_SITE_OTHER): Payer: Self-pay | Admitting: Surgery

## 2011-12-06 VITALS — BP 146/90 | HR 93 | Temp 98.3°F | Ht 63.5 in | Wt 191.2 lb

## 2011-12-06 DIAGNOSIS — R21 Rash and other nonspecific skin eruption: Secondary | ICD-10-CM

## 2011-12-06 DIAGNOSIS — N6089 Other benign mammary dysplasias of unspecified breast: Secondary | ICD-10-CM

## 2011-12-06 MED ORDER — DIPHENHYDRAMINE HCL 50 MG/ML IJ SOLN
50.0000 mg | Freq: Once | INTRAMUSCULAR | Status: AC
  Administered 2011-12-06: 50 mg via INTRAMUSCULAR

## 2011-12-06 NOTE — Progress Notes (Signed)
[facility]  Ovidio Kin, MD,  FACS [address] [place]  [address] [place], [place]    [address] Phone:  [phone] FAX:  [phone]   Re:   Caitlin Thompson DOB:   [birth year] MRN:   [id number]  ASSESSMENT AND PLAN: 1.  Infected sebaceous cyst - now healing, medial left breast (3 o'clock) at sternum  Wound looks good, but still has 2 cm knot.  Will need excision.  I have given her literature on sebaceous cyst.  Would like to give it 2 or three weeks to heal and then schedule excision.  2.  Diabetes mellitus. 3.  Hypertension 4.  Allergic reaction related to recent oral surgery  Took brief course of steroids. 5.  Pruritis from allergic reaction. 6.  History of nephrolithiasis. 7.  Morbid obesity.  HISTORY OF PRESENT ILLNESS: Caitlin Thompson is a 51 y.o. (DOB: 1961/01/14)  AA female who is a patient of Syliva Overman, MD and comes to me today for follow up of an infected cyst of left breast/sternum.  History of cyst: She was seen at the [facility] on [address] 11/21/2011 and sent to our urgent office.  She had noticed a cyst along her sternum on the left side for years.  But it had been small and had not bothered her.  Then over the last 2 weeks prior to presentation, the cyst had enlarged in size and become painful.  I did an I&D in the office on 11/21/2011.  She had mammograms about 4 months ago which were negative.  Current Outpatient Prescriptions  Medication Sig Dispense Refill  . amLODipine (NORVASC) 10 MG tablet Take 1 tablet (10 mg total) by mouth daily.  30 tablet  11  . Blood Glucose Monitoring Suppl (RELION CONFIRM GLUCOSE MONITOR) W/DEVICE KIT 1 kit by Does not apply route daily.  1 kit  0  . cloNIDine (CATAPRES) 0.1 MG tablet One tablet once daily at bedtime for blood pressure  30 tablet  11  . hydrOXYzine (ATARAX/VISTARIL) 25 MG tablet       . ibuprofen (ADVIL,MOTRIN) 200 MG tablet Take 400 mg by mouth every 6 (six) hours as needed. For  pain/fever      . lisinopril (PRINIVIL,ZESTRIL) 40 MG tablet TAKE ONE TABLET BY MOUTH EVERY DAY  30 tablet  3  . metFORMIN (GLUCOPHAGE) 500 MG tablet Take 1,000 mg by mouth 2 (two) times daily.      Marland Kitchen omeprazole (PRILOSEC) 20 MG capsule Take 1 capsule (20 mg total) by mouth daily.  31 capsule  11  . pravastatin (PRAVACHOL) 40 MG tablet TAKE ONE TABLET BY MOUTH EVERY EVENING  30 tablet  2  . ReliOn Lancets MISC Once daily testing Dx:250.00  100 each  11   No current facility-administered medications for this visit.   Facility-Administered Medications Ordered in Other Visits  Medication Dose Route Frequency Provider Last Rate Last Dose  . diphenhydrAMINE (BENADRYL) injection 50 mg  50 mg Intramuscular Once Kerri Perches, MD   50 mg at 12/06/11 7253   Social History: Comes by self.  Lives in her parents house in [place] (they are both deceased). 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  PHYSICAL EXAM: BP 146/90  Pulse 93  Temp 98.3 F (36.8 C) (Temporal)  Ht 5' 3.5" (1.613 m)  Wt 191 lb 3.2 oz (86.728 kg)  BMI 33.34 kg/m2  SpO2 98%  Breast/sternum:  2 cm residual knot.  I think that this would  be best excised.  DATA REVIEWED: None.  Ovidio Kin, MD, FACS Office:  [phone]

## 2011-12-06 NOTE — Telephone Encounter (Signed)
Dr Margo Aye returned call to see the patient.He will see her as a work in today at the [facility]. I contacted pt and gave her this info and she will follow though with this. Dr Margo Aye is also awarethat the pt has appt with derm at [facility]

## 2011-12-12 NOTE — Assessment & Plan Note (Addendum)
Not at goal however with skin condition no change in med till etiology clear.

## 2011-12-12 NOTE — Assessment & Plan Note (Signed)
Elevated TG, Hyperlipidemia:Low fat diet discussed and encouraged.  No med change 

## 2011-12-12 NOTE — Assessment & Plan Note (Addendum)
Uncontrolled, needs med adjustment, however in light of recurrence of severe skin rash of unknown etiology, will not start any new meds at this time Pt has also had recurrent prednisone boosts in recent times and is under excessive stress due to poor health Has stated sulfa allergy and needs to stop glipizide

## 2011-12-12 NOTE — Assessment & Plan Note (Signed)
Multiple hyperpigmented annular lesions on trunk and extremities, worse in past 2 weeks

## 2011-12-12 NOTE — Assessment & Plan Note (Signed)
Normal ekg with history suggestive of stress related symptoms pt reassured

## 2011-12-12 NOTE — Assessment & Plan Note (Signed)
2 week h/o recurrent flare, derm re eval asap

## 2011-12-13 ENCOUNTER — Encounter: Payer: Self-pay | Admitting: Family Medicine

## 2012-01-13 ENCOUNTER — Other Ambulatory Visit: Payer: Self-pay | Admitting: Family Medicine

## 2012-01-16 ENCOUNTER — Ambulatory Visit: Payer: Private Health Insurance - Indemnity | Admitting: Family Medicine

## 2012-01-16 ENCOUNTER — Encounter: Payer: Self-pay | Admitting: Family Medicine

## 2012-02-06 NOTE — Addendum Note (Signed)
Addended by: Kandis Fantasia B on: 02/06/2012 09:59 AM   Modules accepted: Orders

## 2012-02-08 ENCOUNTER — Telehealth: Payer: Self-pay | Admitting: Family Medicine

## 2012-02-08 NOTE — Telephone Encounter (Signed)
Stopped her BP med a week ago because she feels its been causing her rash. She is coming back on the 30th but she is going to keep an eye on her BP but she just wanted you to be aware she is off it for right now to see if its the cause.

## 2012-02-08 NOTE — Telephone Encounter (Signed)
Noted, pt neds to see derm

## 2012-02-12 ENCOUNTER — Telehealth: Payer: Self-pay | Admitting: Family Medicine

## 2012-02-12 NOTE — Telephone Encounter (Signed)
noted 

## 2012-03-01 LAB — LIPID PANEL
Cholesterol: 236 mg/dL — ABNORMAL HIGH (ref 0–200)
HDL: 52 mg/dL (ref >39)
Total CHOL/HDL Ratio: 4.5 Ratio
VLDL: 35 mg/dL (ref 0–40)

## 2012-03-02 LAB — COMPLETE METABOLIC PANEL WITH GFR
AST: 12 U/L (ref 0–37)
Albumin: 4.2 g/dL (ref 3.5–5.2)
Alkaline Phosphatase: 47 U/L (ref 39–117)
Potassium: 4.7 mEq/L (ref 3.5–5.3)
Sodium: 136 mEq/L (ref 135–145)
Total Bilirubin: 0.6 mg/dL (ref 0.3–1.2)
Total Protein: 7.3 g/dL (ref 6.0–8.3)

## 2012-03-02 LAB — HEMOGLOBIN A1C
Hgb A1c MFr Bld: 10.2 % — ABNORMAL HIGH (ref <5.7)
Mean Plasma Glucose: 246 mg/dL — ABNORMAL HIGH (ref <117)

## 2012-03-04 ENCOUNTER — Ambulatory Visit (INDEPENDENT_AMBULATORY_CARE_PROVIDER_SITE_OTHER): Payer: Private Health Insurance - Indemnity | Admitting: Family Medicine

## 2012-03-04 ENCOUNTER — Encounter: Payer: Self-pay | Admitting: Family Medicine

## 2012-03-04 VITALS — BP 150/94 | HR 107 | Resp 16 | Ht 63.5 in | Wt 174.0 lb

## 2012-03-04 DIAGNOSIS — R21 Rash and other nonspecific skin eruption: Secondary | ICD-10-CM

## 2012-03-04 DIAGNOSIS — E119 Type 2 diabetes mellitus without complications: Secondary | ICD-10-CM

## 2012-03-04 DIAGNOSIS — E66811 Obesity, class 1: Secondary | ICD-10-CM

## 2012-03-04 DIAGNOSIS — I10 Essential (primary) hypertension: Secondary | ICD-10-CM

## 2012-03-04 DIAGNOSIS — E669 Obesity, unspecified: Secondary | ICD-10-CM

## 2012-03-04 DIAGNOSIS — E785 Hyperlipidemia, unspecified: Secondary | ICD-10-CM

## 2012-03-04 DIAGNOSIS — IMO0001 Reserved for inherently not codable concepts without codable children: Secondary | ICD-10-CM

## 2012-03-04 DIAGNOSIS — R809 Proteinuria, unspecified: Secondary | ICD-10-CM

## 2012-03-04 DIAGNOSIS — E1165 Type 2 diabetes mellitus with hyperglycemia: Secondary | ICD-10-CM

## 2012-03-04 DIAGNOSIS — E1129 Type 2 diabetes mellitus with other diabetic kidney complication: Secondary | ICD-10-CM

## 2012-03-04 NOTE — Patient Instructions (Addendum)
F/u in 3.5 month  Please focus on diet mainly of watery high fiber vegetables, limit carbs and fatty foods, also salt  Walk 3 miles every day.  Take garlic pills to help blood pressure.  Limit calories to 1200 calories daily.  You may be able to cure yourself of some of your illnesses  I will refer you to an internist at [facility] to help with management of your blood pressure high cholesterol and diabetes, in light of the severe skin condition you had for months, the cause of which is till unclear. You need to submit urine for microalbumin testing, collect 1 week after period ends, you will get container  Fasting lipid, cmp and EGFr, and HBA1C in 3.5 month  Please log into "my chart"  Please get flu vaccine today

## 2012-03-04 NOTE — Progress Notes (Signed)
  Subjective:    Patient ID: Caitlin Thompson, female    DOB: Sep 29, 1960, 51 y.o.   MRN: [id number]  HPI  The PT is here for follow up and re-evaluation of chronic medical conditions, medication management and review of any available recent lab and radiology data.  Preventive health is updated, specifically  Cancer screening and Immunization.   Questions or concerns regarding consultations or procedures which the PT has had in the interim are  Addressed.Reports improvement in her rash though underlying cause remains unclear, she has taken herself off all meds though she is aware that she does need them, and is willing to have internal med at [facility] where she has upcoming dermatology appt be involved in re introducing her to medications she will tolerate and needs for her chronic uncontrolled conditions, diabetes, hypertension, and hyperlipidemia The PT denies any adverse reactions to current medications since the last visit.  There are no new concerns.  States that fasting sugar this morning was 138, also states "when she feels as though her blood pressure is high" she does take a pill   Review of Systems See HPI Denies recent fever or chills. Denies sinus pressure, nasal congestion, ear pain or sore throat. Denies chest congestion, productive cough or wheezing. Denies chest pains, palpitations and leg swelling Denies abdominal pain, nausea, vomiting,diarrhea or constipation.   Denies dysuria, frequency, hesitancy or incontinence. Denies joint pain, swelling and limitation in mobility. Denies headaches, seizures, numbness, or tingling. Denies depression, anxiety or insomnia.         Objective:   Physical Exam  Patient alert and oriented and in no cardiopulmonary distress.  HEENT: No facial asymmetry, EOMI, no sinus tenderness,  oropharynx pink and moist.  Neck supple no adenopathy.  Chest: Clear to auscultation bilaterally.  CVS: S1, S2 no murmurs, no S3.  ABD: Soft non  tender. Bowel sounds normal.  Ext: No edema  MS: Adequate ROM spine, shoulders, hips and knees.  Skin: Intact,hyperpigmented annular skin lesions on trunk and extremities, healed ulcers  Psych: Good eye contact, normal affect. Memory intact not anxious or depressed appearing.  CNS: CN 2-12 intact, power, tone and sensation normal throughout.       Assessment & Plan:

## 2012-03-05 NOTE — Assessment & Plan Note (Signed)
Uncontrolled. DASH diet and commitment to daily physical activity for a minimum of 30 minutes discussed and encouraged, as a part of hypertension management. The importance of attaining a healthy weight is also discussed. Will defer to IM at [facility] to treat

## 2012-03-05 NOTE — Assessment & Plan Note (Signed)
Deteriorated and uncontrolled.  Lowfat diet only at this time. Severe skin reaction, offending agent unknown will have mx through IM at [facility] till case sorted out. Rash improved, off all meds

## 2012-03-05 NOTE — Assessment & Plan Note (Addendum)
Uncontrolled Patient has had severe dermatitis for months, had stated allergy to sulfa, has been on glipizide for years. Uncertain as to offending agent, symptoms started immediately after anesthetic exposure for dental work. Has upcoming appt at [facility], has been off any diabetic meds, self imposed due to severity of rash, will refer to internal med at [facility] to assist with med management. Encouraged to measure carbs, exercise and work aggressively on non medication management of Dm till seen

## 2012-03-05 NOTE — Assessment & Plan Note (Signed)
Probably severe drug eruption, unsure, and no clear underlying causal agent. Has been seen several times by local dermatologist, has upcoming eval at tertiary center. Rash is better now than when last seen i October. Pt has discontinued all prescription meds

## 2012-03-05 NOTE — Assessment & Plan Note (Signed)
Improved. Pt applauded on succesful weight loss through lifestyle change, and encouraged to continue same. Weight loss goal set for the next several months.  

## 2012-04-12 ENCOUNTER — Other Ambulatory Visit: Payer: Self-pay | Admitting: Family Medicine

## 2012-04-12 ENCOUNTER — Telehealth: Payer: Self-pay | Admitting: Family Medicine

## 2012-04-12 DIAGNOSIS — Z889 Allergy status to unspecified drugs, medicaments and biological substances status: Secondary | ICD-10-CM

## 2012-04-12 DIAGNOSIS — E1165 Type 2 diabetes mellitus with hyperglycemia: Secondary | ICD-10-CM

## 2012-04-12 NOTE — Telephone Encounter (Signed)
pls see referral to [facility] , also schedule an appt with me mid March please, let her know of all 3 appts, I spoke with her

## 2012-04-12 NOTE — Telephone Encounter (Signed)
Pt reports "breaking out" with all medication prescription and OTC natural meds taken. She had severe dermatitis last year, she is an uncontrolled diabetic and hypertensive, she was referred to [facility] both for general medicine and to dermatology, reports the dermatology appt "did not go well" had no rash at the time. Still taking no meds afraid to take the amlodipine I have just suggested even for her BP, states she started breaking out 3 days after restarting this along with natural meds. She is being referred to [facility] for testing, also to endocrine at [facility] re uncontrolled diabetes. She also needs an appointment with me in the next 4 weeks to continue to coordinate her care and see exactly where she is

## 2012-04-30 ENCOUNTER — Encounter: Payer: Self-pay | Admitting: Endocrinology

## 2012-04-30 ENCOUNTER — Ambulatory Visit (INDEPENDENT_AMBULATORY_CARE_PROVIDER_SITE_OTHER): Payer: Private Health Insurance - Indemnity | Admitting: Endocrinology

## 2012-04-30 VITALS — BP 122/74 | HR 76 | Wt 175.0 lb

## 2012-04-30 DIAGNOSIS — E119 Type 2 diabetes mellitus without complications: Secondary | ICD-10-CM

## 2012-04-30 MED ORDER — BROMOCRIPTINE MESYLATE 2.5 MG PO TABS: 2.5000 mg | ORAL_TABLET | Freq: Every day | ORAL | Status: DC

## 2012-04-30 NOTE — Patient Instructions (Addendum)
good diet and exercise habits significanly improve the control of your diabetes.  please let me know if you wish to be referred to a dietician.  high blood sugar is very risky to your health.  you should see an eye doctor every year.  You are at higher than average risk for pneumonia and hepatitis-B.  You should be vaccinated against both.   controlling your blood pressure and cholesterol drastically reduces the damage diabetes does to your body.  this also applies to quitting smoking.  please discuss these with your doctor.  you should take an aspirin every day, unless you have been advised by a doctor not to. check your blood sugar once a day.  vary the time of day when you check, between before the 3 meals, and at bedtime.  also check if you have symptoms of your blood sugar being too high or too low.  please keep a record of the readings and bring it to your next appointment here.  please call us sooner if your blood sugar goes below 70, or if you have a lot of readings over 200.  blood tests are being requested for you today.  We'll contact you with results. If the blood test is high, you should consider re-trying the metformin, or taking "bromocriptine."  It has possible side effects of nausea and dizziness.  These go away with time.  You can avoid these by taking it at bedtime, and by taking just take 1/2 pill for the first week.

## 2012-04-30 NOTE — Progress Notes (Signed)
Subjective:    Patient ID: Caitlin Thompson, female    DOB: 10/31/1960, 52 y.o.   MRN: [id number]  HPI pt states 5 years h/o dm.  she is unaware of any chronic complications.  she has never been on insulin.   pt says her diet and exercise are both excellent. She went off several DM meds, due to few weeks of moderate rash throughout the body, but no assoc fever.   She feels as though elev a1c is explained by prednisone rx'ed for her rash.  She says cbg's vary from 116-132.  There is no trend throughout the day. Past Medical History  Diagnosis Date  . Diabetes mellitus type II   . Renal lithiasis 6/11    Hospitalized a [facility] fr 3 days in June   . Hematuria   . Hypertension   . Tubular adenoma of colon 3/13  . Schatzki's ring     Past Surgical History  Procedure Laterality Date  . Wisdom tooth extraction    . Esophageal dilation  05/17/2011    Rourk-Hiatal hernia/Incomplete noncritical Schatzki's ring/HH  . Colonoscopy w/ polypectomy  05/17/11    Rourk-Tubular adenoma colon, benign SB bx    History   Social History  . Marital Status: Single    Spouse Name: N/A    Number of Children: 1  . Years of Education: N/A   Occupational History  . 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  [employer]   Social History Main Topics  . Smoking status: Never Smoker   . Smokeless tobacco: Not on file  . Alcohol Use: No  . Drug Use: No  . Sexually Active: Yes    Birth Control/ Protection: None   Other Topics Concern  . Not on file   Social History Narrative   1 GROWN DAUGHTER    Current Outpatient Prescriptions on File Prior to Visit  Medication Sig Dispense Refill  . Blood Glucose Monitoring Suppl (RELION CONFIRM GLUCOSE MONITOR) W/DEVICE KIT 1 kit by Does not apply route daily.  1 kit  0  . Cinnamon 500 MG capsule Take by mouth. 2 tabs in the am and 2 tabs in the pm      . clotrimazole-betamethasone (LOTRISONE) lotion APPLY   TOPICALLY TWICE  DAILY TO  RASH AREAS  ON  ANTERIOR  CHEST  FOR  2  WEEKS,  THEN  USE  AS  NEEDED  60 mL  1  . hydrOXYzine (ATARAX/VISTARIL) 25 MG tablet       . ReliOn Lancets MISC Once daily testing Dx:250.00  100 each  11   No current facility-administered medications on file prior to visit.    Allergies  Allergen Reactions  . Codeine     REACTION: nausea  . Fish Allergy Hives  . Penicillins Hives  . Sulfonamide Derivatives Hives    Family History  Problem Relation Age of Onset  . Diabetes Brother     x2  . Crohn's disease Sister   . Aneurysm Sister   . Diabetes Mother   . Diabetes Father   . Hypertension Mother   . Hypertension Father   . Kidney failure Mother   . Kidney failure Father     There were no vitals taken for this visit.  Review of Systems denies blurry vision, headache, chest pain, sob, n/v, urinary frequency, cramps, excessive diaphoresis, memory loss, depression, menopausal sxs, and rhinorrhea.  She has lost weight, due to  her efforts.  Easy bruising has resolved.    Objective:   Physical Exam VS: see vs page GEN: no distress HEAD: head: no deformity eyes: no periorbital swelling, no proptosis external nose and ears are normal mouth: no lesion seen NECK: supple, thyroid is not enlarged CHEST WALL: no deformity LUNGS:  Clear to auscultation.   CV: reg rate and rhythm, no murmur ABD: abdomen is soft, nontender.  no hepatosplenomegaly.  not distended.  no hernia MUSCULOSKELETAL: muscle bulk and strength are grossly normal.  no obvious joint swelling.  gait is normal and steady EXTEMITIES: no deformity.  no ulcer on the feet.  feet are of normal color and temp.  no edema PULSES: dorsalis pedis intact bilat.  no carotid bruit NEURO:  cn 2-12 grossly intact.   readily moves all 4's.  sensation is intact to touch on the feet SKIN:  Normal texture and temperature.  No rash or suspicious lesion is visible.   NODES:  None palpable at the neck.   PSYCH: alert, oriented  x3.  Does not appear anxious nor depressed.   Lab Results  Component Value Date   HGBA1C 10.0* 04/30/2012      Assessment & Plan:  Type 2 DM: she still has elevated a1c. Weight-loss, due to her efforts.  Her a1c is still better than it would have been without this.  Rash, better, uncertain etiology.  For now, we'll avoid the meds she was on when she developed the rash.

## 2012-05-01 ENCOUNTER — Ambulatory Visit: Payer: Private Health Insurance - Indemnity | Admitting: Endocrinology

## 2012-05-28 ENCOUNTER — Ambulatory Visit: Payer: Private Health Insurance - Indemnity | Admitting: Endocrinology

## 2012-06-14 ENCOUNTER — Ambulatory Visit (INDEPENDENT_AMBULATORY_CARE_PROVIDER_SITE_OTHER): Payer: Private Health Insurance - Indemnity | Admitting: Endocrinology

## 2012-06-14 ENCOUNTER — Encounter: Payer: Self-pay | Admitting: Endocrinology

## 2012-06-14 VITALS — BP 144/70 | HR 75 | Wt 176.0 lb

## 2012-06-14 DIAGNOSIS — E119 Type 2 diabetes mellitus without complications: Secondary | ICD-10-CM

## 2012-06-14 MED ORDER — METFORMIN HCL ER 500 MG PO TB24: 1000.0000 mg | ORAL_TABLET | Freq: Two times a day (BID) | ORAL | Status: DC

## 2012-06-14 NOTE — Progress Notes (Signed)
  Subjective:    Patient ID: Caitlin Thompson, female    DOB: 08-Jan-1961, 52 y.o.   MRN: [id number]  HPI Pt returns for f/u of type 2 DM (dx'ed 2009; no chronic complications; she has never been on insulin).  She did not tolerate parlodel, due to headache.  no cbg record, but states cbg's vary from 80-320.  She declines insulin and multiple oral agents for now.   Past Medical History  Diagnosis Date  . Diabetes mellitus type II   . Renal lithiasis 6/11    Hospitalized a [facility] fr 3 days in June   . Hematuria   . Hypertension   . Tubular adenoma of colon 3/13  . Schatzki's ring     Past Surgical History  Procedure Laterality Date  . Wisdom tooth extraction    . Esophageal dilation  05/17/2011    Rourk-Hiatal hernia/Incomplete noncritical Schatzki's ring/HH  . Colonoscopy w/ polypectomy  05/17/11    Rourk-Tubular adenoma colon, benign SB bx    History   Social History  . Marital Status: Single    Spouse Name: N/A    Number of Children: 1  . Years of Education: N/A   Occupational History  . 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  [employer]   Social History Main Topics  . Smoking status: Never Smoker   . Smokeless tobacco: Not on file  . Alcohol Use: No  . Drug Use: No  . Sexually Active: Yes    Birth Control/ Protection: None   Other Topics Concern  . Not on file   Social History Narrative   1 GROWN DAUGHTER    Current Outpatient Prescriptions on File Prior to Visit  Medication Sig Dispense Refill  . Blood Glucose Monitoring Suppl (RELION CONFIRM GLUCOSE MONITOR) W/DEVICE KIT 1 kit by Does not apply route daily.  1 kit  0  . Cinnamon 500 MG capsule Take by mouth. 2 tabs in the am and 2 tabs in the pm      . clotrimazole-betamethasone (LOTRISONE) lotion APPLY   TOPICALLY TWICE DAILY TO  RASH AREAS  ON  ANTERIOR  CHEST  FOR  2  WEEKS,  THEN  USE  AS  NEEDED  60 mL  1  . hydrOXYzine (ATARAX/VISTARIL) 25 MG tablet       . ReliOn  Lancets MISC Once daily testing Dx:250.00  100 each  11   No current facility-administered medications on file prior to visit.    Allergies  Allergen Reactions  . Codeine     REACTION: nausea  . Fish Allergy Hives  . Parlodel (Bromocriptine Mesylate)     headache  . Penicillins Hives  . Sulfonamide Derivatives Hives    Family History  Problem Relation Age of Onset  . Diabetes Brother     x2  . Crohn's disease Sister   . Aneurysm Sister   . Diabetes Mother   . Diabetes Father   . Hypertension Mother   . Hypertension Father   . Kidney failure Mother   . Kidney failure Father     BP 144/70  Pulse 75  Wt 176 lb (79.833 kg)  BMI 30.68 kg/m2  SpO2 97%  Review of Systems denies hypoglycemia    Objective:   Physical Exam VITAL SIGNS:  See vs page GENERAL: no distress Ext: no edema     Assessment & Plan:  DM: she probably needs insulin

## 2012-06-14 NOTE — Patient Instructions (Addendum)
check your blood sugar once a day.  vary the time of day when you check, between before the 3 meals, and at bedtime.  also check if you have symptoms of your blood sugar being too high or too low.  please keep a record of the readings and bring it to your next appointment here.  please call us sooner if your blood sugar goes below 70, or if you have a lot of readings over 200.  Please re-try the metformin.  i have sent a prescription to your pharmacy.  Please come back for a follow-up appointment in 2 months.

## 2012-06-19 ENCOUNTER — Encounter: Payer: Self-pay | Admitting: Internal Medicine

## 2012-06-25 ENCOUNTER — Telehealth: Payer: Self-pay

## 2012-06-25 NOTE — Telephone Encounter (Signed)
Patient states she took some meds from her new Endo and it broke her out in hives and they are draining clear liquid. They are opened areas and causing her legs to ache. Advised the soonest we could get her in would be Thursday and she could go to the urgent care if she needed evaluation. She agreed

## 2012-07-03 ENCOUNTER — Ambulatory Visit: Payer: Private Health Insurance - Indemnity | Admitting: Family Medicine

## 2012-09-23 ENCOUNTER — Other Ambulatory Visit: Payer: Self-pay | Admitting: Family Medicine

## 2012-09-23 DIAGNOSIS — Z139 Encounter for screening, unspecified: Secondary | ICD-10-CM

## 2012-09-30 ENCOUNTER — Ambulatory Visit (HOSPITAL_COMMUNITY)
Admission: RE | Admit: 2012-09-30 | Discharge: 2012-09-30 | Disposition: A | Discharge status: Home / Self Care | Payer: Private Health Insurance - Indemnity | Source: Ambulatory Visit | Attending: Family Medicine | Admitting: Family Medicine

## 2012-09-30 DIAGNOSIS — Z139 Encounter for screening, unspecified: Secondary | ICD-10-CM

## 2012-09-30 DIAGNOSIS — Z1231 Encounter for screening mammogram for malignant neoplasm of breast: Secondary | ICD-10-CM | POA: Insufficient documentation

## 2013-03-10 ENCOUNTER — Telehealth: Payer: Self-pay | Admitting: Family Medicine

## 2013-03-10 DIAGNOSIS — E119 Type 2 diabetes mellitus without complications: Secondary | ICD-10-CM

## 2013-03-10 DIAGNOSIS — E785 Hyperlipidemia, unspecified: Secondary | ICD-10-CM

## 2013-03-10 DIAGNOSIS — I10 Essential (primary) hypertension: Secondary | ICD-10-CM

## 2013-03-10 NOTE — Telephone Encounter (Signed)
appt entered and labs faxed

## 2013-03-10 NOTE — Telephone Encounter (Signed)
Pls order and fax to lab fasting CBC, lipid, cmp and EGFR, HBA1C, TSH and microalb, she is already aware Also pls enter appt for 4pm on 03/31/2013, she is aware, thanks DX are 250.0, 401.9, 272,4

## 2013-03-22 LAB — CBC WITH DIFFERENTIAL/PLATELET
BASOS ABS: 0 10*3/uL (ref 0.0–0.1)
BASOS PCT: 0 % (ref 0–1)
EOS PCT: 1 % (ref 0–5)
Eosinophils Absolute: 0.1 10*3/uL (ref 0.0–0.7)
HEMATOCRIT: 38.1 % (ref 36.0–46.0)
Hemoglobin: 12.5 g/dL (ref 12.0–15.0)
LYMPHS PCT: 24 % (ref 12–46)
Lymphs Abs: 2.2 10*3/uL (ref 0.7–4.0)
MCH: 31.3 pg (ref 26.0–34.0)
MCHC: 32.8 g/dL (ref 30.0–36.0)
MCV: 95.3 fL (ref 78.0–100.0)
MONO ABS: 0.5 10*3/uL (ref 0.1–1.0)
Monocytes Relative: 6 % (ref 3–12)
Neutro Abs: 6.1 10*3/uL (ref 1.7–7.7)
Neutrophils Relative %: 69 % (ref 43–77)
Platelets: 305 10*3/uL (ref 150–400)
RBC: 4 MIL/uL (ref 3.87–5.11)
RDW: 12.7 % (ref 11.5–15.5)
WBC: 8.9 10*3/uL (ref 4.0–10.5)

## 2013-03-22 LAB — MICROALBUMIN / CREATININE URINE RATIO
CREATININE, URINE: 121.6 mg/dL
MICROALB UR: 14.5 mg/dL — AB (ref 0.00–1.89)
MICROALB/CREAT RATIO: 119.2 mg/g — AB (ref 0.0–30.0)

## 2013-03-22 LAB — LIPID PANEL
Cholesterol: 209 mg/dL — ABNORMAL HIGH (ref 0–200)
HDL: 65 mg/dL (ref >39)
LDL CALC: 113 mg/dL — AB (ref 0–99)
Total CHOL/HDL Ratio: 3.2 Ratio
Triglycerides: 155 mg/dL — ABNORMAL HIGH (ref <150)
VLDL: 31 mg/dL (ref 0–40)

## 2013-03-22 LAB — COMPLETE METABOLIC PANEL WITH GFR
ALT: 10 U/L (ref 0–35)
AST: 12 U/L (ref 0–37)
Albumin: 3.9 g/dL (ref 3.5–5.2)
Alkaline Phosphatase: 60 U/L (ref 39–117)
BILIRUBIN TOTAL: 0.5 mg/dL (ref 0.3–1.2)
BUN: 10 mg/dL (ref 6–23)
CO2: 21 meq/L (ref 19–32)
Calcium: 9.1 mg/dL (ref 8.4–10.5)
Chloride: 102 mEq/L (ref 96–112)
Creat: 0.66 mg/dL (ref 0.50–1.10)
GFR, Est Non African American: >89 mL/min
Glucose, Bld: 195 mg/dL — ABNORMAL HIGH (ref 70–99)
Potassium: 3.9 mEq/L (ref 3.5–5.3)
SODIUM: 137 meq/L (ref 135–145)
TOTAL PROTEIN: 7.3 g/dL (ref 6.0–8.3)

## 2013-03-22 LAB — HEMOGLOBIN A1C
Hgb A1c MFr Bld: 10.7 % — ABNORMAL HIGH (ref <5.7)
Mean Plasma Glucose: 260 mg/dL — ABNORMAL HIGH (ref <117)

## 2013-03-22 LAB — TSH: TSH: 0.489 u[IU]/mL (ref 0.350–4.500)

## 2013-03-31 ENCOUNTER — Encounter: Payer: Self-pay | Admitting: Family Medicine

## 2013-03-31 ENCOUNTER — Ambulatory Visit (INDEPENDENT_AMBULATORY_CARE_PROVIDER_SITE_OTHER): Payer: Private Health Insurance - Indemnity | Admitting: Family Medicine

## 2013-03-31 ENCOUNTER — Encounter (INDEPENDENT_AMBULATORY_CARE_PROVIDER_SITE_OTHER): Payer: Self-pay

## 2013-03-31 VITALS — BP 140/90 | HR 70 | Resp 18 | Ht 63.5 in | Wt 180.0 lb

## 2013-03-31 DIAGNOSIS — E785 Hyperlipidemia, unspecified: Secondary | ICD-10-CM

## 2013-03-31 DIAGNOSIS — B369 Superficial mycosis, unspecified: Secondary | ICD-10-CM

## 2013-03-31 DIAGNOSIS — E1165 Type 2 diabetes mellitus with hyperglycemia: Secondary | ICD-10-CM

## 2013-03-31 DIAGNOSIS — E669 Obesity, unspecified: Secondary | ICD-10-CM

## 2013-03-31 DIAGNOSIS — N3000 Acute cystitis without hematuria: Secondary | ICD-10-CM

## 2013-03-31 DIAGNOSIS — L309 Dermatitis, unspecified: Secondary | ICD-10-CM | POA: Insufficient documentation

## 2013-03-31 DIAGNOSIS — IMO0002 Reserved for concepts with insufficient information to code with codable children: Secondary | ICD-10-CM

## 2013-03-31 DIAGNOSIS — IMO0001 Reserved for inherently not codable concepts without codable children: Secondary | ICD-10-CM

## 2013-03-31 DIAGNOSIS — L259 Unspecified contact dermatitis, unspecified cause: Secondary | ICD-10-CM

## 2013-03-31 DIAGNOSIS — I10 Essential (primary) hypertension: Secondary | ICD-10-CM

## 2013-03-31 MED ORDER — BETAMETHASONE DIPROPIONATE 0.05 % EX CREA: TOPICAL_CREAM | Freq: Two times a day (BID) | CUTANEOUS | Status: DC

## 2013-03-31 MED ORDER — CANAGLIFLOZIN 100 MG PO TABS: 1.0000 | ORAL_TABLET | Freq: Every day | ORAL | Status: DC

## 2013-03-31 MED ORDER — CLOTRIMAZOLE-BETAMETHASONE 1-0.05 % EX CREA: 1.0000 "application " | TOPICAL_CREAM | Freq: Two times a day (BID) | CUTANEOUS | Status: DC

## 2013-03-31 NOTE — Patient Instructions (Addendum)
F/u in 3 month, call if you need me before    You will start invokana 100mg  one daily, 2  Bottles of sample given finish all of those , then increase to 300mg  once daily  You need to see dietitian need 1 hour class  Need to check sugar Goal for fasting blood sugar ranges from 80 to 120 and 2 hours after any meal or at bedtime should be between 130 to 170.  Need to check your insurance company to see what meter is covered, let us know , you NEED to be checking your sugar  Commit at least 45 mins of exercise every day Cholesterol will be normal in 6 months, you have done well  We will send urine for testing in the morning after checked and get back  To you  Antifungal rash  For back and betamethasone to leg  For short time

## 2013-04-01 LAB — POCT URINALYSIS DIPSTICK
BILIRUBIN UA: NEGATIVE
Blood, UA: NEGATIVE
Glucose, UA: 500
Ketones, UA: NEGATIVE
LEUKOCYTES UA: NEGATIVE
NITRITE UA: POSITIVE
PH UA: 5.5
PROTEIN UA: NEGATIVE
Spec Grav, UA: 1.015
UROBILINOGEN UA: 0.2

## 2013-04-03 ENCOUNTER — Other Ambulatory Visit: Payer: Self-pay | Admitting: Family Medicine

## 2013-04-03 LAB — URINE CULTURE: Colony Count: >=100000

## 2013-04-03 MED ORDER — CIPROFLOXACIN HCL 500 MG PO TABS: 500.0000 mg | ORAL_TABLET | Freq: Two times a day (BID) | ORAL | Status: DC

## 2013-04-06 DIAGNOSIS — N3000 Acute cystitis without hematuria: Secondary | ICD-10-CM | POA: Insufficient documentation

## 2013-04-06 NOTE — Assessment & Plan Note (Signed)
Malodorous urine, abn Ua treated after c/s returned with cipro

## 2013-04-06 NOTE — Assessment & Plan Note (Signed)
Rash on legs, low dose sparingly as needed of mid potent steroid

## 2013-04-06 NOTE — Assessment & Plan Note (Signed)
Unchanged. Patient re-educated about  the importance of commitment to a  minimum of 150 minutes of exercise per week. The importance of healthy food choices with portion control discussed. Encouraged to start a food diary, count calories and to consider  joining a support group. Sample diet sheets offered. Goals set by the patient for the next several months.    

## 2013-04-06 NOTE — Progress Notes (Signed)
   Subjective:    Patient ID: Caitlin Thompson, female    DOB: 10/28/1960, 53 y.o.   MRN: [id number]  HPI Pt in for f/u. She has been on no medication for over 6 months, was frustrated in 2014 due to persistent pruritic rash affecting trunk and limbs but sparing her face, felt to be medication triggered but never definitely diagnosed. She is concerned about her health and in particular her diabetes , states definitely allergic to metformin and sulfa wants to try meds again and unwilling to see endo at this time, felt he was "unconcerned" not listening to her as far as skin symptoms C/o malodorous urine for the past 5 days, denies fever, chills or flank pain, has frequency Denies blurred vision or excessive thirst, ahs not been testing blood sugars   Review of Systems    See HPI Denies recent fever or chills. Denies sinus pressure, nasal congestion, ear pain or sore throat. Denies chest congestion, productive cough or wheezing. Denies chest pains, palpitations and leg swelling Denies abdominal pain, nausea, vomiting,diarrhea or constipation.   . Denies joint pain, swelling and limitation in mobility. Denies headaches, seizures, numbness, or tingling. Denies depression, anxiety or insomnia. C/o pruritic rash on left neck and dry skin and hyperpigmentation on legs   Objective:   Physical Exam        Assessment & Plan:

## 2013-04-06 NOTE — Assessment & Plan Note (Signed)
Improved with change in diet, pt applauded on this. Again no med prescribed, though indicated due to severe allergic dermatitis of unknown etiology in 2014

## 2013-04-06 NOTE — Assessment & Plan Note (Signed)
Uncontrolled h/o allergic reaction to multiple meds, uncertain of exact ones but suffered for over 6 months with progressive rash in 2014 No med prescribed today , will focus on diabetic med first as this is most uncontrolled. DASH diet and commitment to daily physical activity for a minimum of 30 minutes discussed and encouraged, as a part of hypertension management. The importance of attaining a healthy weight is also discussed.

## 2013-04-06 NOTE — Assessment & Plan Note (Signed)
Left side of neck affected, topical med prescribed

## 2013-04-06 NOTE — Assessment & Plan Note (Addendum)
Deteriorated. Patient advised to reduce carb and sweets, commit to regular physical activity, take meds as prescribed, test blood as directed, and attempt to lose weight, to improve blood sugar control. Trial of invokana, samples provided ; 100mg  and then 300mg  tabs, pt to call for script and coupon if she tolerates. Certain of sulfa allergy and metformin allergy , next med to add will be [place]  Needs endo eval and management , but pt unwilling to go this route at this time

## 2013-04-08 ENCOUNTER — Telehealth (HOSPITAL_COMMUNITY): Payer: Self-pay | Admitting: Dietician

## 2013-04-08 NOTE — Telephone Encounter (Signed)
Called at 1317. Pt reports she works until 5 PM and needs after hours appointment. Reported that last individual appointment is at 4 PM, but desires later time. Offered group class at 5:30 Pm. Pt is agreeable. Scheduled for 04/17/13 at 1730.

## 2013-04-08 NOTE — Telephone Encounter (Signed)
Received referral via fax from Dr. Lynnea Maizes for dx: DM, HTN.

## 2013-04-17 ENCOUNTER — Encounter (HOSPITAL_COMMUNITY): Payer: Self-pay | Admitting: Dietician

## 2013-04-17 NOTE — Progress Notes (Signed)
[facility] Diabetes Class Completion  Date:April 17, 2013  Time: 2202  Pt attended [facility]'s Diabetes Group Education Class on April 17, 2013.   Patient was educated on the following topics:   -Survival skills (signs and symptoms of hyperglycemia and hypoglycemia, treatment for hypoglycemia, ideal levels for fasting and postprandial blood sugars, goal Hgb A1c level, foot care basics)  -Recommendations for physical activity   -Carbohydrate metabolism in relation to diabetes   -Meal planning (sources of carbohydrate, carbohydrate counting, meal planning strategies, food label reading, and portion control).  Handouts provided:  -"Diabetes and You: Taking Charge of Your Health"  -"Carbohydrate Counting and Meal Planning"  -"Your Guide to Better Office Visits"   Faline Langer A. Jimmye Norman, RD, LDN

## 2013-04-24 ENCOUNTER — Telehealth: Payer: Self-pay | Admitting: Family Medicine

## 2013-04-24 ENCOUNTER — Other Ambulatory Visit: Payer: Self-pay | Admitting: Family Medicine

## 2013-04-24 DIAGNOSIS — E1165 Type 2 diabetes mellitus with hyperglycemia: Secondary | ICD-10-CM

## 2013-04-24 DIAGNOSIS — IMO0002 Reserved for concepts with insufficient information to code with codable children: Secondary | ICD-10-CM

## 2013-04-24 MED ORDER — CANAGLIFLOZIN 300 MG PO TABS: 1.0000 | ORAL_TABLET | Freq: Every day | ORAL | Status: DC

## 2013-04-24 NOTE — Telephone Encounter (Signed)
Faxed to lab and appt made and coupon in envelope ready for pickup

## 2013-04-24 NOTE — Telephone Encounter (Signed)
Please order HBA1C chem 7 and EGFR non fasting to be done on April 18 or after, fax to lab please Also I need a coupon for invokana, in an envelope , with LP on front , she is to collect it this pm after office closes. Invokana $RemoveBeforeDE'[id number]$  has been sent in to her pharmacy and she is aware Please also just go ahead and schedule a f/u with me the week of April 20 and write the appt info in a note with no name  To be put inside the envelope Along with a reminder about when to get labs etc, thanks!

## 2013-06-19 LAB — BASIC METABOLIC PANEL WITHOUT GFR
BUN: 15 mg/dL (ref 6–23)
CO2: 24 meq/L (ref 19–32)
Calcium: 9.8 mg/dL (ref 8.4–10.5)
Chloride: 101 meq/L (ref 96–112)
Creat: 0.68 mg/dL (ref 0.50–1.10)
GFR, Est African American: >89 mL/min
GFR, Est Non African American: >89 mL/min
Glucose, Bld: 108 mg/dL — ABNORMAL HIGH (ref 70–99)
Potassium: 3.9 meq/L (ref 3.5–5.3)
Sodium: 137 meq/L (ref 135–145)

## 2013-06-19 LAB — HEMOGLOBIN A1C
Hgb A1c MFr Bld: 9.5 % — ABNORMAL HIGH
Mean Plasma Glucose: 226 mg/dL — ABNORMAL HIGH

## 2013-06-23 ENCOUNTER — Encounter (INDEPENDENT_AMBULATORY_CARE_PROVIDER_SITE_OTHER): Payer: Self-pay

## 2013-06-23 ENCOUNTER — Ambulatory Visit: Payer: Private Health Insurance - Indemnity | Admitting: Family Medicine

## 2013-06-23 ENCOUNTER — Encounter: Payer: Self-pay | Admitting: Family Medicine

## 2013-06-23 ENCOUNTER — Ambulatory Visit (INDEPENDENT_AMBULATORY_CARE_PROVIDER_SITE_OTHER): Payer: Private Health Insurance - Indemnity | Admitting: Family Medicine

## 2013-06-23 VITALS — BP 144/88 | HR 100 | Resp 18 | Ht 63.5 in | Wt 177.0 lb

## 2013-06-23 DIAGNOSIS — I10 Essential (primary) hypertension: Secondary | ICD-10-CM

## 2013-06-23 DIAGNOSIS — E1165 Type 2 diabetes mellitus with hyperglycemia: Secondary | ICD-10-CM

## 2013-06-23 DIAGNOSIS — E669 Obesity, unspecified: Secondary | ICD-10-CM

## 2013-06-23 DIAGNOSIS — IMO0002 Reserved for concepts with insufficient information to code with codable children: Secondary | ICD-10-CM

## 2013-06-23 DIAGNOSIS — IMO0001 Reserved for inherently not codable concepts without codable children: Secondary | ICD-10-CM

## 2013-06-23 DIAGNOSIS — E785 Hyperlipidemia, unspecified: Secondary | ICD-10-CM

## 2013-06-23 NOTE — Patient Instructions (Addendum)
CPE in 3 month, call if you need me before  Con grats on improved blood glucose.You really would benefit and need long acting insulin along with the invokana, please reconsider  Exercise every day for 30 minutes at least.  Check blood sugar and believe the result / make sure meter is good, and if consitently high, above 140 in the morning andover 200 any time of the day, call in if you decide to start the insulin. We will have a lot of support to help you to understand the value of, need for and safe use of the insulin  Please schedule your eye exam  Fasting lipid , cmp and EGFR and hBA1C in 3 month

## 2013-06-29 NOTE — Assessment & Plan Note (Signed)
Uncontrolled, needs medication, however pt resistant due to severe allergic skin reaction unclear etiology. DASH diet and commitment to daily physical activity for a minimum of 30 minutes discussed and encouraged, as a part of hypertension management. The importance of attaining a healthy weight is also discussed.

## 2013-06-29 NOTE — Assessment & Plan Note (Signed)
Slight improvemnt, needs insulin, resists , stating she is afraid of insulin and also ahs h/o severe rash generalized which appear mediatio related. She is to call back in next several weeks if she changes her mind as she is encouraged to do , needs the additional mediation Patient advised to reduce carb and sweets, commit to regular physical activity, take meds as prescribed, test blood as directed, and attempt to lose weight, to improve blood sugar control.

## 2013-06-29 NOTE — Progress Notes (Signed)
   Subjective:    Patient ID: Caitlin Thompson, female    DOB: [birth year], 53 y.o.   MRN: [id number]  HPI The PT is here for follow up and re-evaluation of chronic medical conditions, medication management and review of any available recent lab and radiology data.  Preventive health is updated, specifically  Cancer screening and Immunization.  Needs eye exam and pelvic  The PT denies any adverse reactions to current medications since the last visit. Still resists addition of any new meds based on h/po disabling generalized rash of unknown etiology. Exercise inconsistent , and states she "does not trust her meter" needs to address both There are no new concerns.  There are no specific complaints       Review of Systems See HPI Denies recent fever or chills. Denies sinus pressure, nasal congestion, ear pain or sore throat. Denies chest congestion, productive cough or wheezing. Denies chest pains, palpitations and leg swelling Denies abdominal pain, nausea, vomiting,diarrhea or constipation.   Denies dysuria, frequency, hesitancy or incontinence. Denies joint pain, swelling and limitation in mobility. Denies headaches, seizures, numbness, or tingling. Denies depression, anxiety or insomnia. Denies skin break down or rash.        Objective:   Physical Exam  BP 144/88  Pulse 100  Resp 18  Ht 5' 3.5" (1.613 m)  Wt 177 lb 0.6 oz (80.305 kg)  BMI 30.87 kg/m2  SpO2 92% Patient alert and oriented and in no cardiopulmonary distress.  HEENT: No facial asymmetry, EOMI, no sinus tenderness,  oropharynx pink and moist.  Neck supple no adenopathy.  Chest: Clear to auscultation bilaterally.  CVS: S1, S2 no murmurs, no S3.  ABD: Soft non tender. Bowel sounds normal.  Ext: No edema  MS: Adequate ROM spine, shoulders, hips and knees.  Skin: Intact, no ulcerations or rash noted.  Psych: Good eye contact, normal affect. Memory intact not anxious or depressed appearing.  CNS:  CN 2-12 intact, power, tone and sensation normal throughout.       Assessment & Plan:  Diabetes mellitus type 2, uncontrolled Slight improvemnt, needs insulin, resists , stating she is afraid of insulin and also ahs h/o severe rash generalized which appear mediatio related. She is to call back in next several weeks if she changes her mind as she is encouraged to do , needs the additional mediation Patient advised to reduce carb and sweets, commit to regular physical activity, take meds as prescribed, test blood as directed, and attempt to lose weight, to improve blood sugar control.   HYPERTENSION Uncontrolled, needs medication, however pt resistant due to severe allergic skin reaction unclear etiology. DASH diet and commitment to daily physical activity for a minimum of 30 minutes discussed and encouraged, as a part of hypertension management. The importance of attaining a healthy weight is also discussed.   HYPERLIPIDEMIA Improved, not at goal, statin inicated but no additoinal medication is considered safe by patient Hyperlipidemia:Low fat diet discussed and encouraged.    Obesity (BMI 30.0-34.9) Improved. Pt applauded on succesful weight loss through lifestyle change, and encouraged to continue same. Weight loss goal set for the next several months.

## 2013-06-29 NOTE — Assessment & Plan Note (Signed)
Improved. Pt applauded on succesful weight loss through lifestyle change, and encouraged to continue same. Weight loss goal set for the next several months.  

## 2013-06-29 NOTE — Assessment & Plan Note (Signed)
Improved, not at goal, statin inicated but no additoinal medication is considered safe by patient Hyperlipidemia:Low fat diet discussed and encouraged.

## 2013-08-20 ENCOUNTER — Encounter (HOSPITAL_COMMUNITY): Payer: Self-pay | Admitting: Emergency Medicine

## 2013-08-20 ENCOUNTER — Ambulatory Visit: Payer: Private Health Insurance - Indemnity | Admitting: Family Medicine

## 2013-08-20 ENCOUNTER — Emergency Department (HOSPITAL_COMMUNITY): Payer: Managed Care, Other (non HMO)

## 2013-08-20 ENCOUNTER — Inpatient Hospital Stay (HOSPITAL_COMMUNITY)
Admission: EM | Admit: 2013-08-20 | Discharge: 2013-08-23 | DRG: 689 | Disposition: A | Discharge status: Home / Self Care | Payer: Managed Care, Other (non HMO) | Attending: Internal Medicine | Admitting: Internal Medicine

## 2013-08-20 DIAGNOSIS — K219 Gastro-esophageal reflux disease without esophagitis: Secondary | ICD-10-CM

## 2013-08-20 DIAGNOSIS — R1013 Epigastric pain: Secondary | ICD-10-CM

## 2013-08-20 DIAGNOSIS — R0789 Other chest pain: Secondary | ICD-10-CM

## 2013-08-20 DIAGNOSIS — B379 Candidiasis, unspecified: Secondary | ICD-10-CM | POA: Diagnosis present

## 2013-08-20 DIAGNOSIS — IMO0001 Reserved for inherently not codable concepts without codable children: Secondary | ICD-10-CM

## 2013-08-20 DIAGNOSIS — R21 Rash and other nonspecific skin eruption: Secondary | ICD-10-CM

## 2013-08-20 DIAGNOSIS — N10 Acute tubulo-interstitial nephritis: Principal | ICD-10-CM

## 2013-08-20 DIAGNOSIS — R51 Headache: Secondary | ICD-10-CM | POA: Diagnosis present

## 2013-08-20 DIAGNOSIS — IMO0002 Reserved for concepts with insufficient information to code with codable children: Secondary | ICD-10-CM

## 2013-08-20 DIAGNOSIS — R112 Nausea with vomiting, unspecified: Secondary | ICD-10-CM

## 2013-08-20 DIAGNOSIS — L309 Dermatitis, unspecified: Secondary | ICD-10-CM

## 2013-08-20 DIAGNOSIS — M542 Cervicalgia: Secondary | ICD-10-CM

## 2013-08-20 DIAGNOSIS — E079 Disorder of thyroid, unspecified: Secondary | ICD-10-CM

## 2013-08-20 DIAGNOSIS — E785 Hyperlipidemia, unspecified: Secondary | ICD-10-CM

## 2013-08-20 DIAGNOSIS — E66811 Obesity, class 1: Secondary | ICD-10-CM

## 2013-08-20 DIAGNOSIS — Z8249 Family history of ischemic heart disease and other diseases of the circulatory system: Secondary | ICD-10-CM

## 2013-08-20 DIAGNOSIS — Z833 Family history of diabetes mellitus: Secondary | ICD-10-CM

## 2013-08-20 DIAGNOSIS — E1110 Type 2 diabetes mellitus with ketoacidosis without coma: Secondary | ICD-10-CM

## 2013-08-20 DIAGNOSIS — Z6830 Body mass index (BMI) 30.0-30.9, adult: Secondary | ICD-10-CM

## 2013-08-20 DIAGNOSIS — D126 Benign neoplasm of colon, unspecified: Secondary | ICD-10-CM

## 2013-08-20 DIAGNOSIS — E1165 Type 2 diabetes mellitus with hyperglycemia: Secondary | ICD-10-CM | POA: Diagnosis present

## 2013-08-20 DIAGNOSIS — N12 Tubulo-interstitial nephritis, not specified as acute or chronic: Secondary | ICD-10-CM

## 2013-08-20 DIAGNOSIS — L509 Urticaria, unspecified: Secondary | ICD-10-CM

## 2013-08-20 DIAGNOSIS — R519 Headache, unspecified: Secondary | ICD-10-CM

## 2013-08-20 DIAGNOSIS — B369 Superficial mycosis, unspecified: Secondary | ICD-10-CM

## 2013-08-20 DIAGNOSIS — I10 Essential (primary) hypertension: Secondary | ICD-10-CM

## 2013-08-20 DIAGNOSIS — E669 Obesity, unspecified: Secondary | ICD-10-CM | POA: Diagnosis present

## 2013-08-20 DIAGNOSIS — N200 Calculus of kidney: Secondary | ICD-10-CM

## 2013-08-20 DIAGNOSIS — E1310 Other specified diabetes mellitus with ketoacidosis without coma: Secondary | ICD-10-CM

## 2013-08-20 HISTORY — DX: Gastro-esophageal reflux disease without esophagitis: K21.9

## 2013-08-20 HISTORY — DX: Hyperlipidemia, unspecified: E78.5

## 2013-08-20 HISTORY — DX: Superficial mycosis, unspecified: B36.9

## 2013-08-20 HISTORY — DX: Unspecified hydronephrosis: N13.30

## 2013-08-20 HISTORY — DX: Obesity, unspecified: E66.9

## 2013-08-20 HISTORY — DX: Disorder of thyroid, unspecified: E07.9

## 2013-08-20 HISTORY — DX: Benign neoplasm of colon, unspecified: D12.6

## 2013-08-20 LAB — URINALYSIS, ROUTINE W REFLEX MICROSCOPIC
Bilirubin Urine: NEGATIVE
Nitrite: NEGATIVE
Specific Gravity, Urine: 1.015 (ref 1.005–1.030)
UROBILINOGEN UA: 0.2 mg/dL (ref 0.0–1.0)
pH: 6 (ref 5.0–8.0)

## 2013-08-20 LAB — BASIC METABOLIC PANEL
BUN: 9 mg/dL (ref 6–23)
BUN: 9 mg/dL (ref 6–23)
BUN: 9 mg/dL (ref 6–23)
BUN: 8 mg/dL (ref 6–23)
CALCIUM: 9 mg/dL (ref 8.4–10.5)
CALCIUM: 8.6 mg/dL (ref 8.4–10.5)
CHLORIDE: 102 meq/L (ref 96–112)
CHLORIDE: 104 meq/L (ref 96–112)
CO2: 22 mEq/L (ref 19–32)
CO2: 20 mEq/L (ref 19–32)
CO2: 20 mEq/L (ref 19–32)
CO2: 20 mEq/L (ref 19–32)
CREATININE: 0.64 mg/dL (ref 0.50–1.10)
Calcium: 8.6 mg/dL (ref 8.4–10.5)
Calcium: 7.9 mg/dL — ABNORMAL LOW (ref 8.4–10.5)
Chloride: 102 mEq/L (ref 96–112)
Chloride: 103 mEq/L (ref 96–112)
Creatinine, Ser: 0.61 mg/dL (ref 0.50–1.10)
Creatinine, Ser: 0.59 mg/dL (ref 0.50–1.10)
Creatinine, Ser: 0.57 mg/dL (ref 0.50–1.10)
GFR calc Af Amer: >90 mL/min (ref >90)
GFR calc non Af Amer: >90 mL/min (ref >90)
GFR calc non Af Amer: >90 mL/min (ref >90)
Glucose, Bld: 242 mg/dL — ABNORMAL HIGH (ref 70–99)
Glucose, Bld: 220 mg/dL — ABNORMAL HIGH (ref 70–99)
Glucose, Bld: 182 mg/dL — ABNORMAL HIGH (ref 70–99)
Glucose, Bld: 143 mg/dL — ABNORMAL HIGH (ref 70–99)
POTASSIUM: 4.4 meq/L (ref 3.7–5.3)
POTASSIUM: 4.2 meq/L (ref 3.7–5.3)
POTASSIUM: 4.3 meq/L (ref 3.7–5.3)
Potassium: 4 mEq/L (ref 3.7–5.3)
SODIUM: 142 meq/L (ref 137–147)
SODIUM: 141 meq/L (ref 137–147)
Sodium: 142 mEq/L (ref 137–147)
Sodium: 139 mEq/L (ref 137–147)

## 2013-08-20 LAB — GLUCOSE, CAPILLARY
Glucose-Capillary: 109 mg/dL — ABNORMAL HIGH (ref 70–99)
Glucose-Capillary: 120 mg/dL — ABNORMAL HIGH (ref 70–99)
Glucose-Capillary: 126 mg/dL — ABNORMAL HIGH (ref 70–99)
Glucose-Capillary: 156 mg/dL — ABNORMAL HIGH (ref 70–99)

## 2013-08-20 LAB — BLOOD GAS, VENOUS
Acid-base deficit: 2.2 mmol/L — ABNORMAL HIGH (ref 0.0–2.0)
BICARBONATE: 23.2 meq/L (ref 20.0–24.0)
FIO2: 0.21 %
O2 Content: 21 L/min
O2 SAT: 62.8 %
TCO2: 21.2 mmol/L (ref 0–100)
pCO2, Ven: 48.2 mmHg (ref 45.0–50.0)
pH, Ven: 7.304 — ABNORMAL HIGH (ref 7.250–7.300)
pO2, Ven: 38.2 mmHg (ref 30.0–45.0)

## 2013-08-20 LAB — CBC WITH DIFFERENTIAL/PLATELET
BASOS ABS: 0 10*3/uL (ref 0.0–0.1)
BASOS PCT: 0 % (ref 0–1)
EOS PCT: 0 % (ref 0–5)
Eosinophils Absolute: 0 10*3/uL (ref 0.0–0.7)
HEMATOCRIT: 38.7 % (ref 36.0–46.0)
Hemoglobin: 13.1 g/dL (ref 12.0–15.0)
Lymphocytes Relative: 8 % — ABNORMAL LOW (ref 12–46)
Lymphs Abs: 1.2 10*3/uL (ref 0.7–4.0)
MCH: 32.4 pg (ref 26.0–34.0)
MCHC: 33.9 g/dL (ref 30.0–36.0)
MCV: 95.8 fL (ref 78.0–100.0)
MONO ABS: 0.5 10*3/uL (ref 0.1–1.0)
Monocytes Relative: 3 % (ref 3–12)
NEUTROS ABS: 13.1 10*3/uL — AB (ref 1.7–7.7)
Neutrophils Relative %: 89 % — ABNORMAL HIGH (ref 43–77)
Platelets: 251 10*3/uL (ref 150–400)
RBC: 4.04 MIL/uL (ref 3.87–5.11)
RDW: 11.9 % (ref 11.5–15.5)
WBC: 14.9 10*3/uL — ABNORMAL HIGH (ref 4.0–10.5)

## 2013-08-20 LAB — HEPATIC FUNCTION PANEL
ALBUMIN: 3.2 g/dL — AB (ref 3.5–5.2)
ALT: 11 U/L (ref 0–35)
AST: 14 U/L (ref 0–37)
Alkaline Phosphatase: 76 U/L (ref 39–117)
Bilirubin, Direct: <0.2 mg/dL (ref 0.0–0.3)
TOTAL PROTEIN: 7.8 g/dL (ref 6.0–8.3)
Total Bilirubin: 0.4 mg/dL (ref 0.3–1.2)

## 2013-08-20 LAB — KETONES, QUALITATIVE

## 2013-08-20 LAB — URINE MICROSCOPIC-ADD ON

## 2013-08-20 LAB — CBG MONITORING, ED: GLUCOSE-CAPILLARY: 210 mg/dL — AB (ref 70–99)

## 2013-08-20 LAB — LIPASE, BLOOD: Lipase: 17 U/L (ref 11–59)

## 2013-08-20 LAB — MRSA PCR SCREENING: MRSA by PCR: NEGATIVE

## 2013-08-20 MED ORDER — MORPHINE SULFATE 4 MG/ML IJ SOLN: 4.0000 mg | INTRAMUSCULAR | Status: DC | PRN

## 2013-08-20 MED ORDER — OXYCODONE-ACETAMINOPHEN 5-325 MG PO TABS
1.0000 | ORAL_TABLET | ORAL | Status: DC | PRN
Start: 2013-08-20 — End: 2013-08-23

## 2013-08-20 MED ORDER — LEVOFLOXACIN IN D5W 750 MG/150ML IV SOLN
750.0000 mg | Freq: Once | INTRAVENOUS | Status: AC
  Administered 2013-08-20: 750 mg via INTRAVENOUS
  Filled 2013-08-20: qty 150

## 2013-08-20 MED ORDER — SODIUM CHLORIDE 0.9 % IV SOLN
INTRAVENOUS | Status: AC
  Filled 2013-08-20: qty 1

## 2013-08-20 MED ORDER — DEXTROSE 50 % IV SOLN
25.0000 mL | INTRAVENOUS | Status: DC | PRN
Start: 2013-08-20 — End: 2013-08-23

## 2013-08-20 MED ORDER — ONDANSETRON HCL 4 MG/2ML IJ SOLN
INTRAMUSCULAR | Status: AC
  Filled 2013-08-20: qty 2

## 2013-08-20 MED ORDER — SODIUM CHLORIDE 0.9 % IV BOLUS (SEPSIS)
1000.0000 mL | Freq: Once | INTRAVENOUS | Status: AC
  Administered 2013-08-20: 1000 mL via INTRAVENOUS

## 2013-08-20 MED ORDER — FAMOTIDINE IN NACL 20-0.9 MG/50ML-% IV SOLN
INTRAVENOUS | Status: AC
Start: 2013-08-20 — End: 2013-08-20
  Filled 2013-08-20: qty 50

## 2013-08-20 MED ORDER — SODIUM CHLORIDE 0.9 % IV SOLN
INTRAVENOUS | Status: DC
  Administered 2013-08-20: 1.5 [IU]/h via INTRAVENOUS
  Filled 2013-08-20: qty 1

## 2013-08-20 MED ORDER — HYDROMORPHONE HCL PF 1 MG/ML IJ SOLN
INTRAMUSCULAR | Status: AC
  Filled 2013-08-20: qty 1

## 2013-08-20 MED ORDER — LEVOFLOXACIN IN D5W 750 MG/150ML IV SOLN
750.0000 mg | INTRAVENOUS | Status: DC
  Administered 2013-08-21 – 2013-08-22 (×2): 750 mg via INTRAVENOUS
  Filled 2013-08-20 (×2): qty 150

## 2013-08-20 MED ORDER — PROMETHAZINE HCL 25 MG/ML IJ SOLN
12.5000 mg | Freq: Four times a day (QID) | INTRAMUSCULAR | Status: DC | PRN
  Administered 2013-08-22: 12.5 mg via INTRAVENOUS
  Filled 2013-08-20: qty 1

## 2013-08-20 MED ORDER — HEPARIN SODIUM (PORCINE) 5000 UNIT/ML IJ SOLN
5000.0000 [IU] | Freq: Three times a day (TID) | INTRAMUSCULAR | Status: DC
  Administered 2013-08-20 – 2013-08-23 (×8): 5000 [IU] via SUBCUTANEOUS
  Filled 2013-08-20 (×8): qty 1

## 2013-08-20 MED ORDER — ONDANSETRON HCL 4 MG/2ML IJ SOLN
4.0000 mg | Freq: Once | INTRAMUSCULAR | Status: AC
  Administered 2013-08-20: 4 mg via INTRAVENOUS

## 2013-08-20 MED ORDER — ONDANSETRON HCL 4 MG/2ML IJ SOLN
4.0000 mg | Freq: Four times a day (QID) | INTRAMUSCULAR | Status: DC | PRN
  Administered 2013-08-21 – 2013-08-22 (×3): 4 mg via INTRAVENOUS
  Filled 2013-08-20 (×3): qty 2

## 2013-08-20 MED ORDER — LEVOFLOXACIN 750 MG PO TABS: 750.0000 mg | ORAL_TABLET | Freq: Every day | ORAL | Status: DC

## 2013-08-20 MED ORDER — SODIUM CHLORIDE 0.45 % IV SOLN
INTRAVENOUS | Status: AC
  Administered 2013-08-20: 20:00:00 via INTRAVENOUS

## 2013-08-20 MED ORDER — DEXTROSE-NACL 5-0.45 % IV SOLN: INTRAVENOUS | Status: DC

## 2013-08-20 MED ORDER — HYDROMORPHONE HCL PF 1 MG/ML IJ SOLN
1.0000 mg | Freq: Once | INTRAMUSCULAR | Status: AC
  Administered 2013-08-20: 1 mg via INTRAVENOUS

## 2013-08-20 MED ORDER — SODIUM CHLORIDE 0.9 % IV SOLN
INTRAVENOUS | Status: AC
Start: 2013-08-20 — End: 2013-08-20
  Administered 2013-08-20: 20:00:00 via INTRAVENOUS

## 2013-08-20 MED ORDER — DEXTROSE-NACL 5-0.45 % IV SOLN
INTRAVENOUS | Status: DC
  Administered 2013-08-20: 20:00:00 via INTRAVENOUS

## 2013-08-20 MED ORDER — DEXTROSE 5 % IV SOLN: 1.0000 g | INTRAVENOUS | Status: DC

## 2013-08-20 MED ORDER — POTASSIUM CHLORIDE 10 MEQ/100ML IV SOLN
10.0000 meq | INTRAVENOUS | Status: AC
  Administered 2013-08-20 (×2): 10 meq via INTRAVENOUS
  Filled 2013-08-20 (×2): qty 100

## 2013-08-20 MED ORDER — ACETAMINOPHEN 325 MG PO TABS
650.0000 mg | ORAL_TABLET | Freq: Four times a day (QID) | ORAL | Status: DC | PRN
  Administered 2013-08-21 – 2013-08-22 (×2): 650 mg via ORAL
  Filled 2013-08-20 (×2): qty 2

## 2013-08-20 MED ORDER — FAMOTIDINE IN NACL 20-0.9 MG/50ML-% IV SOLN
20.0000 mg | Freq: Two times a day (BID) | INTRAVENOUS | Status: DC
  Administered 2013-08-20 – 2013-08-23 (×6): 20 mg via INTRAVENOUS
  Filled 2013-08-20 (×10): qty 50

## 2013-08-20 NOTE — H&P (Signed)
[facility] History and Physical  CANDA PODGORSKI LPF:[id number] DOB: Sep 24, 1960 DOA: 08/20/2013  Referring physician: ED physician, Dr. Wyvonnia Dusky PCP: Tula Nakayama, MD   Chief Complaint: Flank pain, nausea, and vomiting.  HPI: Caitlin Thompson is a 53 y.o. female with a history of type 2 diabetes mellitus, hypertension, and kidney stones, who presents to the emergency department with a complaint of right flank pain, nausea, and vomiting. She has had intermittent right flank pain for 3 days. She describes it as sharp and throbbing. The pain worsens when she tries to urinate. She has had pain in her lower back with urination and pain over her bladder when she urinates. She is also seen a small amount of blood in her urine. Her last menstrual period was 2 weeks ago. She has had multiple episodes of nausea and vomiting. She denies coffee grounds emesis. She has had loose stools, but she denies bright red blood per rectum or black tarry stools. She felt that the pain was coming from kidney stones. She has had subjective fever and chills.  In the emergency department, she is afebrile and hemodynamically stable. Her urinalysis reveals small leukocytes, large hemoglobin, 2150 WBCs, 21-50 RBCs, and many bacteria. CT scan of her abdomen and pelvis without contrast revealed no hydronephrosis and the previously demonstrated lower pole stone on the left was no longer evident and may have recently passed; no acute hepatobiliary or acute bowel abnormality on the noncontrasted study. Her white blood cell count is elevated at 14.9. Her glucose was initially 242. Her pH is 7.3. Her initial anion gap was 18 and with a repeated basic metabolic panel, it increased to 20. Her blood acetone level is "small". She is being admitted for further evaluation and management.     Review of Systems:  As above in history present illness. In addition, she has gastric acid reflux occasionally. Otherwise review of  systems is negative.  Past Medical History  Diagnosis Date  . Diabetes mellitus type II   . Renal lithiasis 6/11    Hospitalized a [facility] fr 3 days in June   . Hematuria   . Hypertension   . Tubular adenoma of colon 3/13  . Schatzki's ring   . HYDRONEPHROSIS, LEFT 08/30/2009    Qualifier: Diagnosis of  By: Claybon Jabs PA, Dawn    . THYROID STIMULATING HORMONE, ABNORMAL 08/30/2009    Qualifier: Diagnosis of  By: Claybon Jabs PA, Dawn    . GERD (gastroesophageal reflux disease) 04/03/2011    Non-critical Schatzki's ring, HH on EGD 05/17/11 Dr Gala Romney   . Adenomatous colon polyp 06/26/2011    05/17/11 Colonoscopy Dr Chauncy Lean adenoma Next colonoscopy 05/2016   . DERMATOMYCOSIS 05/23/2010    Qualifier: Diagnosis of  By: Moshe Cipro MD, Joycelyn Schmid    . HYPERLIPIDEMIA 12/01/2009    Qualifier: Diagnosis of  By: Claybon Jabs PA, [place]    . Obesity (BMI 30.0-34.9) 07/15/2011   Past Surgical History  Procedure Laterality Date  . Wisdom tooth extraction    . Esophageal dilation  05/17/2011    Rourk-Hiatal hernia/Incomplete noncritical Schatzki's ring/HH  . Colonoscopy w/ polypectomy  05/17/11    Rourk-Tubular adenoma colon, benign SB bx   Social History: She is single. She has one daughter. She is employed by [employer]. She denies alcohol, tobacco, or illicit drug use.  Allergies  Allergen Reactions  . Codeine     REACTION: nausea  . Fish Allergy Hives  . Metformin And Related Dermatitis    severe  .  Parlodel [Bromocriptine Mesylate]     headache  . Penicillins Hives  . Sulfonamide Derivatives Hives    Family History  Problem Relation Age of Onset  . Diabetes Brother     x2  . Crohn's disease Sister   . Aneurysm Sister   . Diabetes Mother   . Diabetes Father   . Hypertension Mother   . Hypertension Father   . Kidney failure Mother   . Kidney failure Father      Prior to Admission medications   Medication Sig Start Date End Date Taking? Authorizing Provider  Canagliflozin 300 MG TABS  Take 1 tablet (300 mg total) by mouth daily. 04/24/13  Yes Fayrene Helper, MD  levofloxacin (LEVAQUIN) 750 MG tablet Take 1 tablet (750 mg total) by mouth daily. 08/20/13   Leota Jacobsen, MD  oxyCODONE-acetaminophen (PERCOCET/ROXICET) 5-325 MG per tablet Take 1-2 tablets by mouth every 4 (four) hours as needed for severe pain. 08/20/13   Leota Jacobsen, MD   Physical Exam: Filed Vitals:   08/20/13 1654  BP: 143/84  Pulse: 100  Temp:   Resp: 16    BP 143/84  Pulse 100  Temp(Src) 98.7 F (37.1 C) (Oral)  Resp 16  Ht 5\' 3"  (1.6 m)  Wt 77.111 kg (170 lb)  BMI 30.12 kg/m2  SpO2 100%  LMP 08/06/2013  General: Alert 53 year old African-American woman in no acute distress, but she appears ill. Eyes: PERRL, normal lids, irises & conjunctiva ENT: Mucous membranes are dry; no posterior exudates or erythema; grossly normal hearing, lips & tongue Neck: no LAD, masses or thyromegaly Cardiovascular: S1, S2, with mild tachycardia. No LE edema. Telemetry: Sinus tachycardia, no arrhythmias  Respiratory: CTA bilaterally, no w/r/r. Normal respiratory effort. Abdomen: Positive bowel sounds, obese, mildly to moderately tender right CVA and suprapubic area; soft,  nondistended; no masses palpated. Skin: no rash or induration seen on limited exam Musculoskeletal: grossly normal tone BUE/BLE Psychiatric: grossly normal mood and affect, speech fluent and appropriate Neurologic: grossly non-focal; cranial nerves II through XII are intact.           Labs on Admission:  Basic Metabolic Panel:  Recent Labs Lab 08/20/13 1601 08/20/13 1743  NA 142 142  K 4.0 4.4  CL 102 102  CO2 22 20  GLUCOSE 242* 220*  BUN 9 9  CREATININE 0.64 0.61  CALCIUM 9.0 8.6   Liver Function Tests: No results found for this basename: AST, ALT, ALKPHOS, BILITOT, PROT, ALBUMIN,  in the last 168 hours No results found for this basename: LIPASE, AMYLASE,  in the last 168 hours No results found for this basename:  AMMONIA,  in the last 168 hours CBC:  Recent Labs Lab 08/20/13 1601  WBC 14.9*  NEUTROABS 13.1*  HGB 13.1  HCT 38.7  MCV 95.8  PLT 251   Cardiac Enzymes: No results found for this basename: CKTOTAL, CKMB, CKMBINDEX, TROPONINI,  in the last 168 hours  BNP (last 3 results) No results found for this basename: PROBNP,  in the last 8760 hours CBG:  Recent Labs Lab 08/20/13 1908  GLUCAP 210*    Radiological Exams on Admission: Ct Abdomen Pelvis Wo Contrast  08/20/2013   CLINICAL DATA:  Abdominal pain.  EXAM: CT ABDOMEN AND PELVIS WITHOUT CONTRAST  TECHNIQUE: Multidetector CT imaging of the abdomen and pelvis was performed following the standard protocol without IV contrast.  COMPARISON:  CT urogram of January 22, 2013  FINDINGS: The kidneys demonstrate normal density and contour.  There is a 2 mm diameter nonobstructing lower pole stone on the right. A previously demonstrated 1-2 mm diameter lower pole stone on the left is not evident today. No ureteral or bladder stone is demonstrated. There is no hydronephrosis. There are coarse phleboliths within the pelvis. The uterus and adnexal structures are within the limits of normal. There is no free pelvic fluid.  The liver, gallbladder, pancreas, spleen, adrenal glands, and abdominal aorta are normal. There is no periaortic nor pericaval lymphadenopathy.  There is a small hiatal hernia. There is no small or large bowel obstruction. There is no colitis or diverticulitis. There is no inguinal or umbilical hernia.  The bony structures are normal.  The lung bases are clear.  IMPRESSION: 1. There is no hydronephrosis. A previously demonstrated lower pole stone on the left is no longer evident and may have been recently passed. There is no evidence of pyelonephritis. 2. There is no acute hepatobiliary nor acute bowel abnormality on this noncontrast study.   Electronically Signed   By: David  Martinique   On: 08/20/2013 14:34    EKG: Independently reviewed.  Nonapplicable.  Assessment/Plan Principal Problem:   Acute pyelonephritis Active Problems:   DKA (diabetic ketoacidoses)   N&V (nausea and vomiting)   Diabetes mellitus type 2, uncontrolled   HYPERTENSION   Obesity (BMI 30.0-34.9)   1. This is a 53 year old woman who presents with acute pyelonephritis and early DKA. Her anion gap on the repeated basic metabolic panel is 20. She has evidence of acetone in her blood. However, her blood glucose is only in the mid 200s and her CO2 is within normal limits. She will be treated for acute pyelonephritis and DKA. Of note, her diabetes is chronically treated with Canaglifozin which can cause ketoacidosis and urinary tract infections.     Plan: 1. The patient received 2 L of IV fluids and IV Levaquin in the ED. Urine culture was ordered and is pending. 2. We'll continue IV antibiotics with Levaquin. 3. Start insulin drip via Glucomander protocol. We'll start IV fluids with D5 half-normal saline to avoid symptomatic hypoglycemia. We'll monitor closely and when her anion gap closes, we will start sliding scale NovoLog and Lantus. We'll consider discontinuation of Canaglifozin indefinitely. 4. We'll add symptomatic treatment with as needed morphine for pain as needed Zofran or as needed Phenergan for nausea. 5. We'll start IV Pepcid every 12 hours.  6. For further evaluation, we'll order lipase and liver transaminases. Will assess her hemoglobin A1c.  Code Status: Full code Family Communication: Family not available Disposition Plan: Discharge with clinically improved and appropriate.  Time spent: One hour 10 minutes.  [facility] Pager [phone]  **Disclaimer: This note may have been dictated with voice recognition software. Similar sounding words can inadvertently be transcribed and this note may contain transcription errors which may not have been corrected upon publication of note.**

## 2013-08-20 NOTE — ED Provider Notes (Signed)
CSN: [id number]     Arrival date & time 08/20/13  1317 History   First MD Initiated Contact with Patient 08/20/13 1335     Chief Complaint  Patient presents with  . Flank Pain     (Consider location/radiation/quality/duration/timing/severity/associated sxs/prior Treatment) Patient is a 53 y.o. female presenting with flank pain. The history is provided by the patient.  Flank Pain   patient here complaining of right sided colicky flank pain x3 days with associated nausea and vomiting. History of kidney stones and this is similar. Denies any dysuria or hematuria. Symptoms persisted and no treatment used prior to arrival. Last kidney stone was 2 years ago. Denies any rashes or flank. No fever or chills. Nothing makes her symptoms better worse.  Past Medical History  Diagnosis Date  . Diabetes mellitus type II   . Renal lithiasis 6/11    Hospitalized a [place] fr 3 days in June   . Hematuria   . Hypertension   . Tubular adenoma of colon 3/13  . Schatzki's ring    Past Surgical History  Procedure Laterality Date  . Wisdom tooth extraction    . Esophageal dilation  05/17/2011    Rourk-Hiatal hernia/Incomplete noncritical Schatzki's ring/HH  . Colonoscopy w/ polypectomy  05/17/11    Rourk-Tubular adenoma colon, benign SB bx   Family History  Problem Relation Age of Onset  . Diabetes Brother     x2  . Crohn's disease Sister   . Aneurysm Sister   . Diabetes Mother   . Diabetes Father   . Hypertension Mother   . Hypertension Father   . Kidney failure Mother   . Kidney failure Father    History  Substance Use Topics  . Smoking status: Never Smoker   . Smokeless tobacco: Not on file  . Alcohol Use: No   OB History   Grav Para Term Preterm Abortions TAB SAB Ect Mult Living                 Review of Systems  Genitourinary: Positive for flank pain.  All other systems reviewed and are negative.     Allergies  Codeine; Fish allergy; Metformin and related; Parlodel;  Penicillins; and Sulfonamide derivatives  Home Medications   Prior to Admission medications   Medication Sig Start Date End Date Taking? Authorizing Nica Friske  Canagliflozin 300 MG TABS Take 1 tablet (300 mg total) by mouth daily. 04/24/13   Fayrene Helper, MD   BP 141/109  Pulse 105  Temp(Src) 98.7 F (37.1 C) (Oral)  Resp 20  Ht 5\' 3"  (1.6 m)  Wt 170 lb (77.111 kg)  BMI 30.12 kg/m2  SpO2 100%  LMP 08/06/2013 Physical Exam  Nursing note and vitals reviewed. Constitutional: She is oriented to person, place, and time. She appears well-developed and well-nourished.  Non-toxic appearance. No distress.  HENT:  Head: Normocephalic and atraumatic.  Eyes: Conjunctivae, EOM and lids are normal. Pupils are equal, round, and reactive to light.  Neck: Normal range of motion. Neck supple. No tracheal deviation present. No mass present.  Cardiovascular: Regular rhythm and normal heart sounds.  Tachycardia present.  Exam reveals no gallop.   No murmur heard. Pulmonary/Chest: Effort normal and breath sounds normal. No stridor. No respiratory distress. She has no decreased breath sounds. She has no wheezes. She has no rhonchi. She has no rales.  Abdominal: Soft. Normal appearance and bowel sounds are normal. She exhibits no distension. There is no tenderness. There is no rebound and  no CVA tenderness.  Musculoskeletal: Normal range of motion. She exhibits no edema and no tenderness.  Neurological: She is alert and oriented to person, place, and time. She has normal strength. No cranial nerve deficit or sensory deficit. GCS eye subscore is 4. GCS verbal subscore is 5. GCS motor subscore is 6.  Skin: Skin is warm and dry. No abrasion and no rash noted.  Psychiatric: She has a normal mood and affect. Her speech is normal and behavior is normal.    ED Course  Procedures (including critical care time) Labs Review Labs Reviewed  URINE CULTURE  URINALYSIS, ROUTINE W REFLEX MICROSCOPIC     Imaging Review No results found.   EKG Interpretation None      MDM   Final diagnoses:  None    Patient given IV fluids here and will be started on medications for pyelonephritis. We'll check blood work and sign patient out to dr rancour    Leota Jacobsen, MD 08/20/13 [id number]

## 2013-08-20 NOTE — ED Provider Notes (Signed)
Assumed care of patient from Dr. Zenia Resides at 4 PM. Patient with pyelonephritis awaiting labs, receiving fluids and antibiotics.  Leukocytosis of 14. Kidney function normal. Patient with glucose 242 with anion gap of 18. Ketones in urine.  PH 7.3 on VBG. Bicarbonate 23. Acetone seen in serum. Ketones in urine.  Will repeat chemistry to see what that is. Discussed with Dr. Caryn Section who agrees with repeating chemistry and seeing if gap closes.   Repeat chemistry showed anion gap of 20. Concern for early DKA. Patient will be started on insulin drip and D5 half-normal saline given her relative hypoglycemia. Discussed with Dr. Caryn Section  CRITICAL CARE Performed by: Ezequiel Essex Total critical care time: 30 Critical care time was exclusive of separately billable procedures and treating other patients. Critical care was necessary to treat or prevent imminent or life-threatening deterioration. Critical care was time spent personally by me on the following activities: development of treatment plan with patient and/or surrogate as well as nursing, discussions with consultants, evaluation of patient's response to treatment, examination of patient, obtaining history from patient or surrogate, ordering and performing treatments and interventions, ordering and review of laboratory studies, ordering and review of radiographic studies, pulse oximetry and re-evaluation of patient's condition.   Ezequiel Essex, MD 08/20/13 2041

## 2013-08-20 NOTE — Discharge Instructions (Signed)
Pyelonephritis, Adult °Pyelonephritis is a kidney infection. In general, there are 2 main types of pyelonephritis: °· Infections that come on quickly without any warning (acute pyelonephritis). °· Infections that persist for a long period of time (chronic pyelonephritis). °CAUSES  °Two main causes of pyelonephritis are: °· Bacteria traveling from the bladder to the kidney. This is a problem especially in pregnant women. The urine in the bladder can become filled with bacteria from multiple causes, including: °¨ Inflammation of the prostate gland (prostatitis). °¨ Sexual intercourse in females. °¨ Bladder infection (cystitis). °· Bacteria traveling from the bloodstream to the tissue part of the kidney. °Problems that may increase your risk of getting a kidney infection include: °· Diabetes. °· Kidney stones or bladder stones. °· Cancer. °· Catheters placed in the bladder. °· Other abnormalities of the kidney or ureter. °SYMPTOMS  °· Abdominal pain. °· Pain in the side or flank area. °· Fever. °· Chills. °· Upset stomach. °· Blood in the urine (dark urine). °· Frequent urination. °· Strong or persistent urge to urinate. °· Burning or stinging when urinating. °DIAGNOSIS  °Your caregiver may diagnose your kidney infection based on your symptoms. A urine sample may also be taken. °TREATMENT  °In general, treatment depends on how severe the infection is.  °· If the infection is mild and caught early, your caregiver may treat you with oral antibiotics and send you home. °· If the infection is more severe, the bacteria may have gotten into the bloodstream. This will require intravenous (IV) antibiotics and a hospital stay. Symptoms may include: °¨ High fever. °¨ Severe flank pain. °¨ Shaking chills. °· Even after a hospital stay, your caregiver may require you to be on oral antibiotics for a period of time. °· Other treatments may be required depending upon the cause of the infection. °HOME CARE INSTRUCTIONS  °· Take your  antibiotics as directed. Finish them even if you start to feel better. °· Make an appointment to have your urine checked to make sure the infection is gone. °· Drink enough fluids to keep your urine clear or pale yellow. °· Take medicines for the bladder if you have urgency and frequency of urination as directed by your caregiver. °SEEK IMMEDIATE MEDICAL CARE IF:  °· You have a fever or persistent symptoms for more than 2-3 days. °· You have a fever and your symptoms suddenly get worse. °· You are unable to take your antibiotics or fluids. °· You develop shaking chills. °· You experience extreme weakness or fainting. °· There is no improvement after 2 days of treatment. °MAKE SURE YOU: °· Understand these instructions. °· Will watch your condition. °· Will get help right away if you are not doing well or get worse. °Document Released: 02/20/2005 Document Revised: 08/22/2011 Document Reviewed: 07/27/2010 °ExitCare® Patient Information ©2015 [employer]. This information is not intended to replace advice given to you by your health care provider. Make sure you discuss any questions you have with your health care provider. ° °

## 2013-08-20 NOTE — ED Notes (Signed)
Right flank pain x 3 days ago with n/v.  Hx of kidney stones.

## 2013-08-20 NOTE — ED Notes (Signed)
Patient states she feels a little nauseated but able to keep the Ginger Ale down at this time.

## 2013-08-21 DIAGNOSIS — N12 Tubulo-interstitial nephritis, not specified as acute or chronic: Secondary | ICD-10-CM

## 2013-08-21 DIAGNOSIS — E669 Obesity, unspecified: Secondary | ICD-10-CM

## 2013-08-21 LAB — BASIC METABOLIC PANEL
BUN: 8 mg/dL (ref 6–23)
BUN: 8 mg/dL (ref 6–23)
CHLORIDE: 103 meq/L (ref 96–112)
CHLORIDE: 105 meq/L (ref 96–112)
CO2: 22 mEq/L (ref 19–32)
CO2: 22 meq/L (ref 19–32)
Calcium: 8 mg/dL — ABNORMAL LOW (ref 8.4–10.5)
Calcium: 7.8 mg/dL — ABNORMAL LOW (ref 8.4–10.5)
Creatinine, Ser: 0.59 mg/dL (ref 0.50–1.10)
Creatinine, Ser: 0.62 mg/dL (ref 0.50–1.10)
GFR calc Af Amer: >90 mL/min (ref >90)
GFR calc non Af Amer: >90 mL/min (ref >90)
GFR calc non Af Amer: >90 mL/min (ref >90)
Glucose, Bld: 133 mg/dL — ABNORMAL HIGH (ref 70–99)
Glucose, Bld: 110 mg/dL — ABNORMAL HIGH (ref 70–99)
POTASSIUM: 3.7 meq/L (ref 3.7–5.3)
Potassium: 4 mEq/L (ref 3.7–5.3)
SODIUM: 139 meq/L (ref 137–147)
Sodium: 139 mEq/L (ref 137–147)

## 2013-08-21 LAB — GLUCOSE, CAPILLARY
GLUCOSE-CAPILLARY: 111 mg/dL — AB (ref 70–99)
GLUCOSE-CAPILLARY: 123 mg/dL — AB (ref 70–99)
Glucose-Capillary: 115 mg/dL — ABNORMAL HIGH (ref 70–99)
Glucose-Capillary: 151 mg/dL — ABNORMAL HIGH (ref 70–99)
Glucose-Capillary: 119 mg/dL — ABNORMAL HIGH (ref 70–99)

## 2013-08-21 LAB — HEMOGLOBIN A1C
Hgb A1c MFr Bld: 8.3 % — ABNORMAL HIGH (ref <5.7)
Mean Plasma Glucose: 192 mg/dL — ABNORMAL HIGH (ref <117)

## 2013-08-21 LAB — CBC
HEMATOCRIT: 34.8 % — AB (ref 36.0–46.0)
Hemoglobin: 11.5 g/dL — ABNORMAL LOW (ref 12.0–15.0)
MCH: 31.8 pg (ref 26.0–34.0)
MCHC: 33 g/dL (ref 30.0–36.0)
MCV: 96.1 fL (ref 78.0–100.0)
Platelets: 241 10*3/uL (ref 150–400)
RBC: 3.62 MIL/uL — AB (ref 3.87–5.11)
RDW: 11.9 % (ref 11.5–15.5)
WBC: 8.5 10*3/uL (ref 4.0–10.5)

## 2013-08-21 MED ORDER — SODIUM CHLORIDE 0.45 % IV SOLN
INTRAVENOUS | Status: DC
  Administered 2013-08-21 – 2013-08-22 (×5): via INTRAVENOUS

## 2013-08-21 MED ORDER — INSULIN GLARGINE 100 UNIT/ML ~~LOC~~ SOLN
5.0000 [IU] | Freq: Every day | SUBCUTANEOUS | Status: DC
  Administered 2013-08-21 – 2013-08-23 (×3): 5 [IU] via SUBCUTANEOUS
  Filled 2013-08-21 (×5): qty 0.05

## 2013-08-21 MED ORDER — INSULIN ASPART 100 UNIT/ML ~~LOC~~ SOLN
0.0000 [IU] | Freq: Three times a day (TID) | SUBCUTANEOUS | Status: DC
  Administered 2013-08-21: 3 [IU] via SUBCUTANEOUS

## 2013-08-21 MED ORDER — TRAMADOL HCL 50 MG PO TABS
50.0000 mg | ORAL_TABLET | Freq: Four times a day (QID) | ORAL | Status: DC
  Administered 2013-08-21 – 2013-08-23 (×6): 50 mg via ORAL
  Filled 2013-08-21 (×9): qty 1

## 2013-08-21 MED ORDER — FAMOTIDINE IN NACL 20-0.9 MG/50ML-% IV SOLN
INTRAVENOUS | Status: AC
  Filled 2013-08-21: qty 50

## 2013-08-21 MED ORDER — IBUPROFEN 800 MG PO TABS
800.0000 mg | ORAL_TABLET | Freq: Three times a day (TID) | ORAL | Status: DC | PRN
  Administered 2013-08-21: 800 mg via ORAL
  Filled 2013-08-21: qty 1

## 2013-08-21 MED ORDER — MORPHINE SULFATE 2 MG/ML IJ SOLN: 2.0000 mg | Freq: Four times a day (QID) | INTRAMUSCULAR | Status: DC | PRN

## 2013-08-21 MED ORDER — LIVING WELL WITH DIABETES BOOK
Freq: Once | Status: DC
  Filled 2013-08-21: qty 1

## 2013-08-21 NOTE — Progress Notes (Signed)
[facility]                                                                              Patient Demographics  Caitlin Thompson, is a 53 y.o. female, DOB - 1960/10/30, [place] date - 08/20/2013   Admitting Physician Rexene Alberts, MD  Outpatient Primary MD for the patient is Tula Nakayama, MD  LOS - 1   Chief Complaint  Patient presents with  . Flank Pain      HPI: Caitlin Thompson is a 53 y.o. female with a history of type 2 diabetes mellitus, hypertension, and kidney stones, who presented to the emergency department with complaints of right flank pain, nausea, and vomiting. She has had intermittent right flank pain for 3 days. She described it as sharp and throbbing. The pain worsened when she tried to urinate. She has had pain in her lower back with urination and pain over her bladder when she urinates. She has also seen a small amount of blood in her urine. Her last menstrual period was 2 weeks ago. She has had multiple episodes of nausea and vomiting. She denied coffee grounds emesis. She has had loose stools, but she denies bright red blood per rectum or black tarry stools. She felt that the pain was coming from kidney stones. She has had subjective fever and chills.   In the emergency department, she was afebrile and hemodynamically stable. Her urinalysis revealed small leukocytes, large hemoglobin, 21-50 WBCs, 21-50 RBCs, and many bacteria. CT scan of her abdomen and pelvis without contrast revealed no hydronephrosis and the previously demonstrated lower pole stone on the left was no longer evident and may have recently passed; no acute hepatobiliary or acute bowel abnormality on the noncontrasted study. Her white blood cell count was elevated at 14.9. Her glucose was initially 242. Her pH is 7.3. Her initial anion gap was 18 and with a repeated basic metabolic panel, it increased to 20. Her blood acetone level is "small". She is being admitted for further  evaluation and management.   Assessment & Plan   Acute pyelonephritis with flank pain, nausea, vomiting -UA: WBC 21-50, many bacteria, small leukocytes -Urine culture pending -Will continue Levaquin -Flank pain, nausea, vomiting improving -Continue pain control as well as anti-emetics  Increased Anion gap/? DKA -Upon admission, patients initial glucose was 242 and she had an increased gap of 20 -Likely secondary to patient's pyelonephritis -Her anion gap did resolve quickly -Will continue insulin sliding scale as well as Lantus -Patient was placed on a liquid diet and tolerated it well, was then transitioned to a diabetic diet however would like to be on a soft diet.  Diabetes mellitus type 2 -Hemoglobin A1c 9.5 (06/18/2013) -Will continue Lantus as well as insulin sliding scale and CBG monitoring  Obesity -Patient she speak with her primary care physician regarding a weight loss program and diet modifications after discharge.  Code Status: Full  Family Communication: None at bedside  Disposition Plan: Admitted, patient will be transferred to general medical floor today. Will likely discharge patient home on 08/22/2013.  Time Spent in minutes   30 minutes  Procedures  None  Consults   None  DVT Prophylaxis  heparin  Lab Results  Component Value Date   PLT 241 08/21/2013    Medications  Scheduled Meds: . famotidine (PEPCID) IV  20 mg Intravenous Q12H  . heparin  5,000 Units Subcutaneous 3 times per day  . insulin aspart  0-15 Units Subcutaneous TID WC  . insulin glargine  5 Units Subcutaneous Daily  . levofloxacin (LEVAQUIN) IV  750 mg Intravenous Q24H  . traMADol  50 mg Oral 4 times per day   Continuous Infusions: . sodium chloride 100 mL/hr at 08/21/13 0500   PRN Meds:.acetaminophen, dextrose, morphine injection, ondansetron (ZOFRAN) IV, promethazine  Antibiotics    Anti-infectives   Start     Dose/Rate Route Frequency Ordered Stop   08/21/13 1500   levofloxacin (LEVAQUIN) IVPB 750 mg     750 mg 100 mL/hr over 90 Minutes Intravenous Every 24 hours 08/20/13 1948     08/20/13 1545  cefTRIAXone (ROCEPHIN) 1 g in dextrose 5 % 50 mL IVPB  Status:  Discontinued     1 g 100 mL/hr over 30 Minutes Intravenous Every 24 hours 08/20/13 1530 08/20/13 1531   08/20/13 1545  levofloxacin (LEVAQUIN) IVPB 750 mg     750 mg 100 mL/hr over 90 Minutes Intravenous  Once 08/20/13 1531 08/20/13 1749   08/20/13 0000  levofloxacin (LEVAQUIN) 750 MG tablet     750 mg Oral Daily 08/20/13 1533          Subjective:   Carlis Stable Volcy seen and examined today.  Patient complains of headache.  She states her back pain has eased some.  She does not have much of an appetite.  She denies abdominal pain, chest pain, SOB.    Objective:   Filed Vitals:   08/21/13 0300 08/21/13 0400 08/21/13 0500 08/21/13 0532  BP: 117/72 112/69 122/72   Pulse:      Temp:  98.1 F (36.7 C)    TempSrc:  Oral    Resp: 21 16 17    Height:      Weight:    77.4 kg (170 lb 10.2 oz)  SpO2:        Wt Readings from Last 3 Encounters:  08/21/13 77.4 kg (170 lb 10.2 oz)  06/23/13 80.305 kg (177 lb 0.6 oz)  03/31/13 81.666 kg (180 lb 0.6 oz)    No intake or output data in the 24 hours ending 08/21/13 0820  Exam  General: Well developed, well nourished, NAD, appears stated age  HEENT: NCAT, PERRLA, EOMI, Anicteic Sclera, mucous membranes moist.   Neck: Supple, no JVD, no masses  Cardiovascular: S1 S2 auscultated, no rubs, murmurs or gallops. Regular rate and rhythm.  Respiratory: Clear to auscultation bilaterally with equal chest rise  Abdomen: Soft, nontender, nondistended, + bowel sounds  Extremities: warm dry without cyanosis clubbing or edema  Neuro: AAOx3, cranial nerves grossly intact. Strength 5/5 in patient's upper and lower extremities bilaterally  Skin: Without rashes exudates or nodules  Psych: Normal affect and demeanor with intact judgement and  insight  Data Review   Micro Results Recent Results (from the past 240 hour(s))  MRSA PCR SCREENING     Status: None   Collection Time    08/20/13  9:00 PM      Result Value Ref Range Status   MRSA by PCR NEGATIVE  NEGATIVE Final   Comment:            The GeneXpert MRSA Assay (FDA     approved for NASAL specimens  only), is one component of a     comprehensive MRSA colonization     surveillance program. It is not     intended to diagnose MRSA     infection nor to guide or     monitor treatment for     MRSA infections.    Radiology Reports Ct Abdomen Pelvis Wo Contrast  08/20/2013   CLINICAL DATA:  Abdominal pain.  EXAM: CT ABDOMEN AND PELVIS WITHOUT CONTRAST  TECHNIQUE: Multidetector CT imaging of the abdomen and pelvis was performed following the standard protocol without IV contrast.  COMPARISON:  CT urogram of January 22, 2013  FINDINGS: The kidneys demonstrate normal density and contour. There is a 2 mm diameter nonobstructing lower pole stone on the right. A previously demonstrated 1-2 mm diameter lower pole stone on the left is not evident today. No ureteral or bladder stone is demonstrated. There is no hydronephrosis. There are coarse phleboliths within the pelvis. The uterus and adnexal structures are within the limits of normal. There is no free pelvic fluid.  The liver, gallbladder, pancreas, spleen, adrenal glands, and abdominal aorta are normal. There is no periaortic nor pericaval lymphadenopathy.  There is a small hiatal hernia. There is no small or large bowel obstruction. There is no colitis or diverticulitis. There is no inguinal or umbilical hernia.  The bony structures are normal.  The lung bases are clear.  IMPRESSION: 1. There is no hydronephrosis. A previously demonstrated lower pole stone on the left is no longer evident and may have been recently passed. There is no evidence of pyelonephritis. 2. There is no acute hepatobiliary nor acute bowel abnormality on this  noncontrast study.   Electronically Signed   By: David  Martinique   On: 08/20/2013 14:34    CBC  Recent Labs Lab 08/20/13 1601 08/21/13 0522  WBC 14.9* 8.5  HGB 13.1 11.5*  HCT 38.7 34.8*  PLT 251 241  MCV 95.8 96.1  MCH 32.4 31.8  MCHC 33.9 33.0  RDW 11.9 11.9  LYMPHSABS 1.2  --   MONOABS 0.5  --   EOSABS 0.0  --   BASOSABS 0.0  --     Chemistries   Recent Labs Lab 08/20/13 1743 08/20/13 1928 08/20/13 2000 08/20/13 2225 08/21/13 0025 08/21/13 0522  NA 142  --  141 139 139 139  K 4.4  --  4.2 4.3 4.0 3.7  CL 102  --  103 104 103 105  CO2 20  --  20 20 22 22   GLUCOSE 220*  --  182* 143* 133* 110*  BUN 9  --  9 8 8 8   CREATININE 0.61  --  0.59 0.57 0.59 0.62  CALCIUM 8.6  --  8.6 7.9* 8.0* 7.8*  AST  --  14  --   --   --   --   ALT  --  11  --   --   --   --   ALKPHOS  --  76  --   --   --   --   BILITOT  --  0.4  --   --   --   --    ------------------------------------------------------------------------------------------------------------------ estimated creatinine clearance is 83.8 ml/min (by C-G formula based on Cr of 0.62). ------------------------------------------------------------------------------------------------------------------ No results found for this basename: HGBA1C,  in the last 72 hours ------------------------------------------------------------------------------------------------------------------ No results found for this basename: CHOL, HDL, LDLCALC, TRIG, CHOLHDL, LDLDIRECT,  in the last 72 hours ------------------------------------------------------------------------------------------------------------------ No results found for this basename:  TSH, T4TOTAL, FREET3, T3FREE, THYROIDAB,  in the last 72 hours ------------------------------------------------------------------------------------------------------------------ No results found for this basename: VITAMINB12, FOLATE, FERRITIN, TIBC, IRON, RETICCTPCT,  in the last 72  hours  Coagulation profile No results found for this basename: INR, PROTIME,  in the last 168 hours  No results found for this basename: DDIMER,  in the last 72 hours  Cardiac Enzymes No results found for this basename: CK, CKMB, TROPONINI, MYOGLOBIN,  in the last 168 hours ------------------------------------------------------------------------------------------------------------------ No components found with this basename: POCBNP,     MIKHAIL, MARYANN D.O. on 08/21/2013 at 8:20 AM  Between 7am to 7pm - Pager - [phone]  After 7pm go to [email or web address] - password [email or web address]  And look for the night coverage person covering for me after hours  [facility] Office  [phone]

## 2013-08-21 NOTE — Care Management Utilization Note (Signed)
UR completed 

## 2013-08-21 NOTE — Progress Notes (Signed)
Report called to [employer]. Patient transported via wheelchair to 300

## 2013-08-22 DIAGNOSIS — K219 Gastro-esophageal reflux disease without esophagitis: Secondary | ICD-10-CM

## 2013-08-22 DIAGNOSIS — E1310 Other specified diabetes mellitus with ketoacidosis without coma: Secondary | ICD-10-CM

## 2013-08-22 LAB — CBC
HEMATOCRIT: 37 % (ref 36.0–46.0)
HEMOGLOBIN: 12.4 g/dL (ref 12.0–15.0)
MCH: 31.9 pg (ref 26.0–34.0)
MCHC: 33.5 g/dL (ref 30.0–36.0)
MCV: 95.1 fL (ref 78.0–100.0)
Platelets: 271 10*3/uL (ref 150–400)
RBC: 3.89 MIL/uL (ref 3.87–5.11)
RDW: 11.7 % (ref 11.5–15.5)
WBC: 7.6 10*3/uL (ref 4.0–10.5)

## 2013-08-22 LAB — GLUCOSE, CAPILLARY
GLUCOSE-CAPILLARY: 115 mg/dL — AB (ref 70–99)
GLUCOSE-CAPILLARY: 153 mg/dL — AB (ref 70–99)
Glucose-Capillary: 111 mg/dL — ABNORMAL HIGH (ref 70–99)
Glucose-Capillary: 116 mg/dL — ABNORMAL HIGH (ref 70–99)

## 2013-08-22 LAB — BASIC METABOLIC PANEL
BUN: 9 mg/dL (ref 6–23)
CO2: 22 meq/L (ref 19–32)
Calcium: 8.5 mg/dL (ref 8.4–10.5)
Chloride: 100 mEq/L (ref 96–112)
Creatinine, Ser: 0.62 mg/dL (ref 0.50–1.10)
GFR calc Af Amer: >90 mL/min (ref >90)
GFR calc non Af Amer: >90 mL/min (ref >90)
GLUCOSE: 122 mg/dL — AB (ref 70–99)
POTASSIUM: 3.7 meq/L (ref 3.7–5.3)
Sodium: 136 mEq/L — ABNORMAL LOW (ref 137–147)

## 2013-08-22 MED ORDER — ALUM & MAG HYDROXIDE-SIMETH 200-200-20 MG/5ML PO SUSP: 30.0000 mL | Freq: Four times a day (QID) | ORAL | Status: DC | PRN

## 2013-08-22 MED ORDER — FLUCONAZOLE 100 MG PO TABS
150.0000 mg | ORAL_TABLET | Freq: Once | ORAL | Status: AC
  Administered 2013-08-22: 150 mg via ORAL
  Filled 2013-08-22: qty 2

## 2013-08-22 NOTE — Evaluation (Signed)
Physical Therapy Evaluation Patient Details Name: Caitlin Thompson MRN: [id number] DOB: 1960-03-21 Today's Date: 08/22/2013   History of Present Illness  Caitlin Thompson is a 53 y.o. female with a history of type 2 diabetes mellitus, hypertension, and kidney stones, who presents to the emergency department with a complaint of right flank pain, nausea, and vomiting. She has had intermittent right flank pain for 3 days. She describes it as sharp and throbbing. The pain worsens when she tries to urinate. She has had pain in her lower back with urination and pain over her bladder when she urinates. She is also seen a small amount of blood in her urine. Her last menstrual period was 2 weeks ago. She has had multiple episodes of nausea and vomiting. She denies coffee grounds emesis. She has had loose stools, but she denies bright red blood per rectum or black tarry stools. She felt that the pain was coming from kidney stones. She has had subjective fever and chills.  Clinical Impression  Pt is a 53 year old female who presents to PT secondary to generalized weakness from nausea/vomiting.  Pt reports she lives alone in a single story home with 3 steps to enter, with (B) handrails.  Pt has some DME equipment and grab bars from when her mother lived with her, however she reports she was (I) with all mobility skills.  She reports she was very active, and was working two jobs.  During evaluation, the pt was (I) with bed mobility skills, mod (I) for transfers, and able to amb ~80 feet holding onto IV with supervision. Pt demonstrates good gait patterning, though decreased cadence due to nausea/generalized weakness.  Mild weakness noted with MMT testing at 4+/5, though no significant deficits noted.  Recommend continued PT while pt is in the hospital for improvement of activity tolerance and functional mobility, though no additional services recommended at discharge.  No DME recommendations.     Follow Up  Recommendations No PT follow up    Equipment Recommendations  None recommended by PT    Recommendations for Other Services       Precautions / Restrictions Precautions Precautions: None Restrictions Weight Bearing Restrictions: No      Mobility  Bed Mobility Overal bed mobility: Independent                Transfers Overall transfer level: Modified independent               General transfer comment: Increased time.   Ambulation/Gait Ambulation/Gait assistance: Supervision Ambulation Distance (Feet): 80 Feet Assistive device: None (Held onto IV pole) Gait Pattern/deviations: WFL(Within Functional Limits)   Gait velocity interpretation: Below normal speed for age/gender        Balance Overall balance assessment: No apparent balance deficits (not formally assessed)                                           Pertinent Vitals/Pain RN in room as PT entered for evaluation, and giving pt medication secondary to her HA.      Home Living Family/patient expects to be discharged to:: Private residence Living Arrangements: Alone Available Help at Discharge: Family;Available PRN/intermittently Type of Home: House Home Access: Stairs to enter Entrance Stairs-Rails: Can reach both Entrance Stairs-Number of Steps: 3 Home Layout: One level Home Equipment: Walker - 2 wheels;Grab bars - toilet;Grab bars - tub/shower;Cane - single  point;Shower seat      Prior Function Level of Independence: Independent                  Extremity/Trunk Assessment               Lower Extremity Assessment: Overall WFL for tasks assessed         Communication   Communication: No difficulties  Cognition Arousal/Alertness: Awake/alert Behavior During Therapy: WFL for tasks assessed/performed Overall Cognitive Status: Within Functional Limits for tasks assessed                               Assessment/Plan    PT Assessment Patient  needs continued PT services  PT Diagnosis Difficulty walking;Generalized weakness   PT Problem List Decreased activity tolerance  PT Treatment Interventions Gait training;Stair training;Therapeutic activities   PT Goals (Current goals can be found in the Care Plan section) Acute Rehab PT Goals Patient Stated Goal: Go home PT Goal Formulation: With patient Time For Goal Achievement: 09/05/13 Potential to Achieve Goals: Good    Frequency Min 3X/week    End of Session Equipment Utilized During Treatment: Gait belt Activity Tolerance: Patient limited by fatigue Patient left: in chair;with call bell/phone within reach           Time: 1051-1110 PT Time Calculation (min): 19 min   Charges:   PT Evaluation $Initial PT Evaluation Tier I: 1 Procedure      WOODWORTH,STEPHANIE 08/22/2013, 11:15 AM

## 2013-08-22 NOTE — Plan of Care (Signed)
Problem: Phase II Progression Outcomes Goal: Progress activity as tolerated unless otherwise ordered Outcome: Completed/Met Date Met:  08/22/13 OOB to chair.

## 2013-08-22 NOTE — Progress Notes (Signed)
Notified Dr. Ree Kida about the patient being confused after a dose of phenergan.  New orders given and followed.

## 2013-08-22 NOTE — Progress Notes (Signed)
[facility]                                                                              Patient Demographics  Caitlin Thompson, is a 53 y.o. female, DOB - 1961-02-20, [place] date - 08/20/2013   Admitting Physician Rexene Alberts, MD  Outpatient Primary MD for the patient is Tula Nakayama, MD  LOS - 2   Chief Complaint  Patient presents with  . Flank Pain      HPI: Caitlin Thompson is a 54 y.o. female with a history of type 2 diabetes mellitus, hypertension, and kidney stones, who presented to the emergency department with complaints of right flank pain, nausea, and vomiting. She has had intermittent right flank pain for 3 days. She described it as sharp and throbbing. The pain worsened when she tried to urinate. She has had pain in her lower back with urination and pain over her bladder when she urinates. She has also seen a small amount of blood in her urine. Her last menstrual period was 2 weeks ago. She has had multiple episodes of nausea and vomiting. She denied coffee grounds emesis. She has had loose stools, but she denies bright red blood per rectum or black tarry stools. She felt that the pain was coming from kidney stones. She has had subjective fever and chills.   In the emergency department, she was afebrile and hemodynamically stable. Her urinalysis revealed small leukocytes, large hemoglobin, 21-50 WBCs, 21-50 RBCs, and many bacteria. CT scan of her abdomen and pelvis without contrast revealed no hydronephrosis and the previously demonstrated lower pole stone on the left was no longer evident and may have recently passed; no acute hepatobiliary or acute bowel abnormality on the noncontrasted study. Her white blood cell count was elevated at 14.9. Her glucose was initially 242. Her pH is 7.3. Her initial anion gap was 18 and with a repeated basic metabolic panel, it increased to 20. Her blood acetone level is "small". She is being admitted for further  evaluation and management.   Assessment & Plan   Acute pyelonephritis with flank pain, nausea, vomiting -UA: WBC 21-50, many bacteria, small leukocytes -Urine culture: >100K Ecoli, pending sensitivities -Continue Levaquin -Flank pain improving -Continues to have nausea, will continue anti-emetics and pepcid -Will add mylanta for dyspepsia type symptoms  Increased Anion gap/? DKA -Upon admission, patients initial glucose was 242 and she had an increased gap of 20 -Likely secondary to patient's pyelonephritis -Her anion gap did resolve quickly -Will continue insulin sliding scale as well as Lantus -Continue full liquid diet as requested by patient  Diabetes mellitus type 2 -Hemoglobin A1c 9.5 (06/18/2013) -Will continue Lantus as well as insulin sliding scale and CBG monitoring  Obesity -Patient she speak with her primary care physician regarding a weight loss program and diet modifications after discharge.  Yeast infection -Will give 1 dose of Diflucan 150 mg  Code Status: Full  Family Communication: None at bedside  Disposition Plan: Admitted, if nausea improves, will likely discharge on 08/23/2013.  Time Spent in minutes   25 minutes  Procedures  None  Consults   None  DVT Prophylaxis  heparin  Lab Results  Component Value Date   PLT 271 08/22/2013    Medications  Scheduled Meds: . famotidine (PEPCID) IV  20 mg Intravenous Q12H  . fluconazole  150 mg Oral Once  . heparin  5,000 Units Subcutaneous 3 times per day  . insulin aspart  0-15 Units Subcutaneous TID WC  . insulin glargine  5 Units Subcutaneous Daily  . levofloxacin (LEVAQUIN) IV  750 mg Intravenous Q24H  . living well with diabetes book   Does not apply Once  . traMADol  50 mg Oral 4 times per day   Continuous Infusions: . sodium chloride 100 mL/hr at 08/21/13 2225   PRN Meds:.acetaminophen, dextrose, ibuprofen, morphine injection, morphine injection, ondansetron (ZOFRAN) IV,  promethazine  Antibiotics    Anti-infectives   Start     Dose/Rate Route Frequency Ordered Stop   08/22/13 0945  fluconazole (DIFLUCAN) tablet 150 mg     150 mg Oral  Once 08/22/13 0931     08/21/13 1500  levofloxacin (LEVAQUIN) IVPB 750 mg     750 mg 100 mL/hr over 90 Minutes Intravenous Every 24 hours 08/20/13 1948     08/20/13 1545  cefTRIAXone (ROCEPHIN) 1 g in dextrose 5 % 50 mL IVPB  Status:  Discontinued     1 g 100 mL/hr over 30 Minutes Intravenous Every 24 hours 08/20/13 1530 08/20/13 1531   08/20/13 1545  levofloxacin (LEVAQUIN) IVPB 750 mg     750 mg 100 mL/hr over 90 Minutes Intravenous  Once 08/20/13 1531 08/20/13 1749   08/20/13 0000  levofloxacin (LEVAQUIN) 750 MG tablet     750 mg Oral Daily 08/20/13 1533          Subjective:   Caitlin Thompson seen and examined today.  Patient complains of headache.  She states her back pain has eased some.  She does not have much of an appetite.  She denies abdominal pain, chest pain, SOB.    Objective:   Filed Vitals:   08/21/13 1500 08/21/13 1541 08/21/13 2100 08/22/13 0541  BP: 141/82 130/81 139/81 144/85  Pulse:  83 83 84  Temp:  98.1 F (36.7 C) 98.1 F (36.7 C) 97.8 F (36.6 C)  TempSrc:  Oral Oral Oral  Resp: 18 20 18 20   Height:      Weight:      SpO2:  96% 97% 98%    Wt Readings from Last 3 Encounters:  08/21/13 77.4 kg (170 lb 10.2 oz)  06/23/13 80.305 kg (177 lb 0.6 oz)  03/31/13 81.666 kg (180 lb 0.6 oz)     Intake/Output Summary (Last 24 hours) at 08/22/13 0933 Last data filed at 08/22/13 9381  Gross per 24 hour  Intake   2360 ml  Output    601 ml  Net   1759 ml    Exam  General: Well developed, well nourished, NAD, appears stated age  HEENT: NCAT, mucous membranes moist.   Neck: Supple, no JVD, no masses  Cardiovascular: S1 S2 auscultated, no rubs, murmurs or gallops. Regular rate and rhythm.  Respiratory: Clear to auscultation bilaterally with equal chest rise  Abdomen: Soft,  nontender, nondistended, + bowel sounds  GU: normal appearing with scant discharge  Extremities: warm dry without cyanosis clubbing or edema  Neuro: AAOx3, no focal deficits  Skin: Without rashes exudates or nodules  Psych: Normal affect and demeanor with intact judgement and insight  Data Review   Micro Results Recent Results (from the past 240 hour(s))  URINE CULTURE  Status: None   Collection Time    08/20/13  2:57 PM      Result Value Ref Range Status   Specimen Description URINE, CLEAN CATCH   Final   Special Requests NONE   Final   Culture  Setup Time     Final   Value: 08/21/2013 01:16     Performed at [place]     Final   Value: >=100,000 COLONIES/ML     Performed at [facility]   Culture     Final   Value: ESCHERICHIA COLI     Performed at [facility]   Report Status PENDING   Incomplete  MRSA PCR SCREENING     Status: None   Collection Time    08/20/13  9:00 PM      Result Value Ref Range Status   MRSA by PCR NEGATIVE  NEGATIVE Final   Comment:            The GeneXpert MRSA Assay (FDA     approved for NASAL specimens     only), is one component of a     comprehensive MRSA colonization     surveillance program. It is not     intended to diagnose MRSA     infection nor to guide or     monitor treatment for     MRSA infections.    Radiology Reports Ct Abdomen Pelvis Wo Contrast  08/20/2013   CLINICAL DATA:  Abdominal pain.  EXAM: CT ABDOMEN AND PELVIS WITHOUT CONTRAST  TECHNIQUE: Multidetector CT imaging of the abdomen and pelvis was performed following the standard protocol without IV contrast.  COMPARISON:  CT urogram of January 22, 2013  FINDINGS: The kidneys demonstrate normal density and contour. There is a 2 mm diameter nonobstructing lower pole stone on the right. A previously demonstrated 1-2 mm diameter lower pole stone on the left is not evident today. No ureteral or bladder stone is demonstrated.  There is no hydronephrosis. There are coarse phleboliths within the pelvis. The uterus and adnexal structures are within the limits of normal. There is no free pelvic fluid.  The liver, gallbladder, pancreas, spleen, adrenal glands, and abdominal aorta are normal. There is no periaortic nor pericaval lymphadenopathy.  There is a small hiatal hernia. There is no small or large bowel obstruction. There is no colitis or diverticulitis. There is no inguinal or umbilical hernia.  The bony structures are normal.  The lung bases are clear.  IMPRESSION: 1. There is no hydronephrosis. A previously demonstrated lower pole stone on the left is no longer evident and may have been recently passed. There is no evidence of pyelonephritis. 2. There is no acute hepatobiliary nor acute bowel abnormality on this noncontrast study.   Electronically Signed   By: David  Martinique   On: 08/20/2013 14:34    CBC  Recent Labs Lab 08/20/13 1601 08/21/13 0522 08/22/13 0554  WBC 14.9* 8.5 7.6  HGB 13.1 11.5* 12.4  HCT 38.7 34.8* 37.0  PLT 251 241 271  MCV 95.8 96.1 95.1  MCH 32.4 31.8 31.9  MCHC 33.9 33.0 33.5  RDW 11.9 11.9 11.7  LYMPHSABS 1.2  --   --   MONOABS 0.5  --   --   EOSABS 0.0  --   --   BASOSABS 0.0  --   --     Chemistries   Recent Labs Lab 08/20/13 1928 08/20/13 2000 08/20/13 2225 08/21/13 0025 08/21/13  0522 08/22/13 0554  NA  --  141 139 139 139 136*  K  --  4.2 4.3 4.0 3.7 3.7  CL  --  103 104 103 105 100  CO2  --  20 20 22 22 22   GLUCOSE  --  182* 143* 133* 110* 122*  BUN  --  9 8 8 8 9   CREATININE  --  0.59 0.57 0.59 0.62 0.62  CALCIUM  --  8.6 7.9* 8.0* 7.8* 8.5  AST 14  --   --   --   --   --   ALT 11  --   --   --   --   --   ALKPHOS 76  --   --   --   --   --   BILITOT 0.4  --   --   --   --   --    ------------------------------------------------------------------------------------------------------------------ estimated creatinine clearance is 83.8 ml/min (by C-G formula  based on Cr of 0.62). ------------------------------------------------------------------------------------------------------------------  Recent Labs  08/20/13 2000  HGBA1C 8.3*   ------------------------------------------------------------------------------------------------------------------ No results found for this basename: CHOL, HDL, LDLCALC, TRIG, CHOLHDL, LDLDIRECT,  in the last 72 hours ------------------------------------------------------------------------------------------------------------------ No results found for this basename: TSH, T4TOTAL, FREET3, T3FREE, THYROIDAB,  in the last 72 hours ------------------------------------------------------------------------------------------------------------------ No results found for this basename: VITAMINB12, FOLATE, FERRITIN, TIBC, IRON, RETICCTPCT,  in the last 72 hours  Coagulation profile No results found for this basename: INR, PROTIME,  in the last 168 hours  No results found for this basename: DDIMER,  in the last 72 hours  Cardiac Enzymes No results found for this basename: CK, CKMB, TROPONINI, MYOGLOBIN,  in the last 168 hours ------------------------------------------------------------------------------------------------------------------ No components found with this basename: POCBNP,     MIKHAIL, MARYANN D.O. on 08/22/2013 at 9:33 AM  Between 7am to 7pm - Pager - [phone]  After 7pm go to [email or web address] - password [email or web address]  And look for the night coverage person covering for me after hours  [facility] Office  [phone]

## 2013-08-23 DIAGNOSIS — R51 Headache: Secondary | ICD-10-CM

## 2013-08-23 LAB — BASIC METABOLIC PANEL
BUN: 7 mg/dL (ref 6–23)
CHLORIDE: 102 meq/L (ref 96–112)
CO2: 26 mEq/L (ref 19–32)
Calcium: 8.8 mg/dL (ref 8.4–10.5)
Creatinine, Ser: 0.77 mg/dL (ref 0.50–1.10)
GFR calc Af Amer: >90 mL/min (ref >90)
GLUCOSE: 108 mg/dL — AB (ref 70–99)
POTASSIUM: 3.9 meq/L (ref 3.7–5.3)
SODIUM: 138 meq/L (ref 137–147)

## 2013-08-23 LAB — GLUCOSE, CAPILLARY: GLUCOSE-CAPILLARY: 118 mg/dL — AB (ref 70–99)

## 2013-08-23 LAB — URINE CULTURE: Colony Count: >=100000

## 2013-08-23 MED ORDER — ALUM & MAG HYDROXIDE-SIMETH 200-200-20 MG/5ML PO SUSP: 30.0000 mL | Freq: Four times a day (QID) | ORAL | Status: DC | PRN

## 2013-08-23 MED ORDER — LEVOFLOXACIN 750 MG PO TABS: 750.0000 mg | ORAL_TABLET | Freq: Every day | ORAL | Status: DC

## 2013-08-23 MED ORDER — ONDANSETRON HCL 4 MG PO TABS: 4.0000 mg | ORAL_TABLET | Freq: Three times a day (TID) | ORAL | Status: DC | PRN

## 2013-08-23 MED ORDER — FLUCONAZOLE 150 MG PO TABS: 150.0000 mg | ORAL_TABLET | Freq: Every day | ORAL | Status: DC

## 2013-08-23 NOTE — Progress Notes (Signed)
Patient discharged with instructions, prescription, and care notes.  Verbalized understanding via teach back.  IV was removed and the site was WNL. Patient voiced no further complaints or concerns at the time of discharge.  Appointments scheduled per instructions.  Patient left the floor via w/c with staff and family in stable condition. 

## 2013-08-23 NOTE — Discharge Summary (Signed)
Physician Discharge Summary  Caitlin Thompson ZOX:[id number] DOB: 08/16/1960 DOA: 08/20/2013  PCP: Tula Nakayama, MD  Admit date: 08/20/2013 Discharge date: 08/23/2013  Time spent: 45 minutes  Recommendations for Outpatient Follow-up:  Patient will be discharged home. She is to continue her medications as prescribed. Patient should follow up with her primary care physician within one week of discharge. Patient should continue a carb modified diet. No physical restrictions are necessary at this time.  Discharge Diagnoses:  Principal Problem:   Acute pyelonephritis Active Problems:   Diabetes mellitus type 2, uncontrolled   HYPERTENSION   Obesity (BMI 30.0-34.9)   DKA (diabetic ketoacidoses)   N&V (nausea and vomiting)   Increased Anion gap   Yeast Infection  Discharge Condition: Stable  Diet recommendation: Carb modified  Filed Weights   08/20/13 1324 08/20/13 2020 08/21/13 0532  Weight: 77.111 kg (170 lb) 77.4 kg (170 lb 10.2 oz) 77.4 kg (170 lb 10.2 oz)    History of present illness:  Caitlin Thompson is a 53 y.o. female with a history of type 2 diabetes mellitus, hypertension, and kidney stones, who presented to the emergency department with complaints of right flank pain, nausea, and vomiting. She has had intermittent right flank pain for 3 days. She described it as sharp and throbbing. The pain worsened when she tried to urinate. She has had pain in her lower back with urination and pain over her bladder when she urinates. She has also seen a small amount of blood in her urine. Her last menstrual period was 2 weeks ago. She has had multiple episodes of nausea and vomiting. She denied coffee grounds emesis. She has had loose stools, but she denies bright red blood per rectum or black tarry stools. She felt that the pain was coming from kidney stones. She has had subjective fever and chills.   In the emergency department, she was afebrile and hemodynamically stable. Her  urinalysis revealed small leukocytes, large hemoglobin, 21-50 WBCs, 21-50 RBCs, and many bacteria. CT scan of her abdomen and pelvis without contrast revealed no hydronephrosis and the previously demonstrated lower pole stone on the left was no longer evident and may have recently passed; no acute hepatobiliary or acute bowel abnormality on the noncontrasted study. Her white blood cell count was elevated at 14.9. Her glucose was initially 242. Her pH is 7.3. Her initial anion gap was 18 and with a repeated basic metabolic panel, it increased to 20. Her blood acetone level is "small". She is being admitted for further evaluation and management.  Hospital Course:  Acute pyelonephritis with flank pain, nausea, vomiting  -UA: WBC 21-50, many bacteria, small leukocytes  -Urine culture: >100K Ecoli -Continue Levaquin  -Flank pain improving  -Nausea improved, will discharge with Zofran  -Continue mylanta for dyspepsia type symptoms   Increased Anion gap/? DKA  -Upon admission, patients initial glucose was 242 and she had an increased gap of 20  -Likely secondary to patient's pyelonephritis  -Her anion gap did resolve quickly  -Was placed on  insulin sliding scale as well as Lantus during hospitalization -Continue full liquid diet as requested by patient  -Patient may resume home medication at discharge -She wishes to discuss possible insulin needs with her PCP  Diabetes mellitus type 2  -Hemoglobin A1c 8.3 (appears to be trending downward from previous) -treatment and plan as above  Obesity  -Patient she speak with her primary care physician regarding a weight loss program and diet modifications after discharge.   Yeast infection  -  Improving, given 1 dose of Diflucan 150 mg  Procedures: None  Consultations: None  Discharge Exam: Filed Vitals:   08/23/13 0546  BP: 145/84  Pulse: 72  Temp: 98.3 F (36.8 C)  Resp: 18   Exam  General: Well developed, well nourished, NAD, appears  stated age  HEENT: NCAT, mucous membranes moist.  Neck: Supple, no JVD, no masses  Cardiovascular: S1 S2 auscultated, no rubs, murmurs or gallops. Regular rate and rhythm.  Respiratory: Clear to auscultation bilaterally with equal chest rise  Abdomen: Soft, nontender, nondistended, + bowel sounds   Extremities: warm dry without cyanosis clubbing or edema  Neuro: AAOx3, no focal deficits  Skin: Without rashes exudates or nodules  Psych: Normal affect and demeanor with intact judgement and insight  Discharge Instructions      Discharge Instructions   Diet Carb Modified    Complete by:  As directed      Discharge instructions    Complete by:  As directed   Patient will be discharged home. She is to continue her medications as prescribed. Patient should follow up with her primary care physician within one week of discharge. Patient should continue a carb modified diet. No physical restrictions are necessary at this time.            Medication List         alum & mag hydroxide-simeth 200-200-20 MG/5ML suspension  Commonly known as:  MAALOX/MYLANTA  Take 30 mLs by mouth every 6 (six) hours as needed for indigestion or heartburn.     Canagliflozin 300 MG Tabs  Take 1 tablet (300 mg total) by mouth daily.     fluconazole 150 MG tablet  Commonly known as:  DIFLUCAN  Take 1 tablet (150 mg total) by mouth daily.     levofloxacin 750 MG tablet  Commonly known as:  LEVAQUIN  Take 1 tablet (750 mg total) by mouth daily.     ondansetron 4 MG tablet  Commonly known as:  ZOFRAN  Take 1 tablet (4 mg total) by mouth every 8 (eight) hours as needed for nausea or vomiting.     oxyCODONE-acetaminophen 5-325 MG per tablet  Commonly known as:  PERCOCET/ROXICET  Take 1-2 tablets by mouth every 4 (four) hours as needed for severe pain.       Allergies  Allergen Reactions  . Codeine     REACTION: nausea  . Fish Allergy Hives  . Metformin And Related Dermatitis    severe  . Parlodel  [Bromocriptine Mesylate]     headache  . Penicillins Hives  . Sulfonamide Derivatives Hives   Follow-up Information   Follow up with Tula Nakayama, MD. Schedule an appointment as soon as possible for a visit in 1 week. [facility] followup)    Specialty:  Family Medicine   Contact information:   [address], [place] [address] [phone]        The results of significant diagnostics from this hospitalization (including imaging, microbiology, ancillary and laboratory) are listed below for reference.    Significant Diagnostic Studies: Ct Abdomen Pelvis Wo Contrast  08/20/2013   CLINICAL DATA:  Abdominal pain.  EXAM: CT ABDOMEN AND PELVIS WITHOUT CONTRAST  TECHNIQUE: Multidetector CT imaging of the abdomen and pelvis was performed following the standard protocol without IV contrast.  COMPARISON:  CT urogram of January 22, 2013  FINDINGS: The kidneys demonstrate normal density and contour. There is a 2 mm diameter nonobstructing lower pole stone on the right.  A previously demonstrated 1-2 mm diameter lower pole stone on the left is not evident today. No ureteral or bladder stone is demonstrated. There is no hydronephrosis. There are coarse phleboliths within the pelvis. The uterus and adnexal structures are within the limits of normal. There is no free pelvic fluid.  The liver, gallbladder, pancreas, spleen, adrenal glands, and abdominal aorta are normal. There is no periaortic nor pericaval lymphadenopathy.  There is a small hiatal hernia. There is no small or large bowel obstruction. There is no colitis or diverticulitis. There is no inguinal or umbilical hernia.  The bony structures are normal.  The lung bases are clear.  IMPRESSION: 1. There is no hydronephrosis. A previously demonstrated lower pole stone on the left is no longer evident and may have been recently passed. There is no evidence of pyelonephritis. 2. There is no acute hepatobiliary nor acute bowel abnormality on  this noncontrast study.   Electronically Signed   By: David  Martinique   On: 08/20/2013 14:34    Microbiology: Recent Results (from the past 240 hour(s))  URINE CULTURE     Status: None   Collection Time    08/20/13  2:57 PM      Result Value Ref Range Status   Specimen Description URINE, CLEAN CATCH   Final   Special Requests NONE   Final   Culture  Setup Time     Final   Value: 08/21/2013 01:16     Performed at [facility]     Final   Value: >=100,000 COLONIES/ML     Performed at [facility]   Culture     Final   Value: ESCHERICHIA COLI     Performed at [facility]   Report Status 08/23/2013 FINAL   Final   Organism ID, Bacteria ESCHERICHIA COLI   Final  MRSA PCR SCREENING     Status: None   Collection Time    08/20/13  9:00 PM      Result Value Ref Range Status   MRSA by PCR NEGATIVE  NEGATIVE Final   Comment:            The GeneXpert MRSA Assay (FDA     approved for NASAL specimens     only), is one component of a     comprehensive MRSA colonization     surveillance program. It is not     intended to diagnose MRSA     infection nor to guide or     monitor treatment for     MRSA infections.     Labs: Basic Metabolic Panel:  Recent Labs Lab 08/20/13 2225 08/21/13 0025 08/21/13 0522 08/22/13 0554 08/23/13 0558  NA 139 139 139 136* 138  K 4.3 4.0 3.7 3.7 3.9  CL 104 103 105 100 102  CO2 20 22 22 22 26   GLUCOSE 143* 133* 110* 122* 108*  BUN 8 8 8 9 7   CREATININE 0.57 0.59 0.62 0.62 0.77  CALCIUM 7.9* 8.0* 7.8* 8.5 8.8   Liver Function Tests:  Recent Labs Lab 08/20/13 1928  AST 14  ALT 11  ALKPHOS 76  BILITOT 0.4  PROT 7.8  ALBUMIN 3.2*    Recent Labs Lab 08/20/13 1928  LIPASE 17   No results found for this basename: AMMONIA,  in the last 168 hours CBC:  Recent Labs Lab 08/20/13 1601 08/21/13 0522 08/22/13 0554  WBC 14.9* 8.5 7.6  NEUTROABS 13.1*  --   --  HGB 13.1 11.5* 12.4  HCT 38.7 34.8*  37.0  MCV 95.8 96.1 95.1  PLT 251 241 271   Cardiac Enzymes: No results found for this basename: CKTOTAL, CKMB, CKMBINDEX, TROPONINI,  in the last 168 hours BNP: BNP (last 3 results) No results found for this basename: PROBNP,  in the last 8760 hours CBG:  Recent Labs Lab 08/22/13 0730 08/22/13 1153 08/22/13 1624 08/22/13 2031 08/23/13 0757  GLUCAP 115* 111* 153* 116* 118*       Signed:  Tanveer Dobberstein  [facility] 08/23/2013, 10:06 AM

## 2013-08-25 NOTE — Care Management Utilization Note (Signed)
UR completed 

## 2013-09-09 ENCOUNTER — Ambulatory Visit (INDEPENDENT_AMBULATORY_CARE_PROVIDER_SITE_OTHER): Payer: Managed Care, Other (non HMO) | Admitting: Family Medicine

## 2013-09-09 ENCOUNTER — Encounter: Payer: Self-pay | Admitting: Family Medicine

## 2013-09-09 VITALS — BP 142/82 | HR 100 | Resp 18 | Ht 63.5 in | Wt 171.1 lb

## 2013-09-09 DIAGNOSIS — E785 Hyperlipidemia, unspecified: Secondary | ICD-10-CM

## 2013-09-09 DIAGNOSIS — I10 Essential (primary) hypertension: Secondary | ICD-10-CM

## 2013-09-09 DIAGNOSIS — T300 Burn of unspecified body region, unspecified degree: Secondary | ICD-10-CM

## 2013-09-09 DIAGNOSIS — E669 Obesity, unspecified: Secondary | ICD-10-CM

## 2013-09-09 DIAGNOSIS — E1165 Type 2 diabetes mellitus with hyperglycemia: Secondary | ICD-10-CM

## 2013-09-09 DIAGNOSIS — IMO0002 Reserved for concepts with insufficient information to code with codable children: Secondary | ICD-10-CM

## 2013-09-09 DIAGNOSIS — IMO0001 Reserved for inherently not codable concepts without codable children: Secondary | ICD-10-CM
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
## 2013-09-17 ENCOUNTER — Other Ambulatory Visit: Payer: Self-pay

## 2013-09-17 MED ORDER — CANAGLIFLOZIN 300 MG PO TABS: 1.0000 | ORAL_TABLET | Freq: Every day | ORAL | Status: DC

## 2013-09-25 ENCOUNTER — Encounter: Payer: Private Health Insurance - Indemnity | Admitting: Family Medicine

## 2013-11-19 DIAGNOSIS — T300 Burn of unspecified body region, unspecified degree: Secondary | ICD-10-CM | POA: Insufficient documentation

## 2013-11-19 NOTE — Assessment & Plan Note (Signed)
Improved, current medication Patient advised to reduce carb and sweets, commit to regular physical activity, take meds as prescribed, test blood as directed, and attempt to lose weight, to improve blood sugar control. Updated lab needed at/ before next visit.
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
## 2013-11-19 NOTE — Assessment & Plan Note (Signed)
lipoma Updated lab needed at/ before next visit.  
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
## 2013-11-19 NOTE — Assessment & Plan Note (Signed)
Improved. Pt applauded on succesful weight loss through lifestyle change, and encouraged to continue same. Weight loss goal set for the next several months.  

## 2013-11-19 NOTE — Assessment & Plan Note (Signed)
Improved though not at goal, pt is managing this with lifesttle only, continue same. DASH diet and commitment to daily physical activity for a minimum of 30 minutes discussed and encouraged, as a part of hypertension management. The importance of attaining a healthy weight is also discussed.

## 2013-12-12 LAB — COMPLETE METABOLIC PANEL WITH GFR
ALBUMIN: 4.4 g/dL (ref 3.5–5.2)
ALT: 13 U/L (ref 0–35)
AST: 14 U/L (ref 0–37)
Alkaline Phosphatase: 56 U/L (ref 39–117)
BUN: 18 mg/dL (ref 6–23)
CALCIUM: 9.7 mg/dL (ref 8.4–10.5)
CHLORIDE: 101 meq/L (ref 96–112)
CO2: 22 mEq/L (ref 19–32)
Creat: 0.81 mg/dL (ref 0.50–1.10)
GFR, Est African American: >89 mL/min
GFR, Est Non African American: 85 mL/min
Glucose, Bld: 123 mg/dL — ABNORMAL HIGH (ref 70–99)
POTASSIUM: 3.9 meq/L (ref 3.5–5.3)
Sodium: 136 mEq/L (ref 135–145)
Total Bilirubin: 0.6 mg/dL (ref 0.2–1.2)
Total Protein: 8 g/dL (ref 6.0–8.3)

## 2013-12-12 LAB — LIPID PANEL
CHOLESTEROL: 249 mg/dL — AB (ref 0–200)
HDL: 71 mg/dL (ref >39)
LDL CALC: 152 mg/dL — AB (ref 0–99)
Total CHOL/HDL Ratio: 3.5 Ratio
Triglycerides: 130 mg/dL (ref <150)
VLDL: 26 mg/dL (ref 0–40)

## 2013-12-12 LAB — HEMOGLOBIN A1C
HEMOGLOBIN A1C: 9.3 % — AB (ref <5.7)
MEAN PLASMA GLUCOSE: 220 mg/dL — AB (ref <117)

## 2013-12-15 ENCOUNTER — Encounter: Payer: Self-pay | Admitting: Family Medicine

## 2013-12-15 ENCOUNTER — Ambulatory Visit (INDEPENDENT_AMBULATORY_CARE_PROVIDER_SITE_OTHER): Payer: Managed Care, Other (non HMO) | Admitting: Family Medicine

## 2013-12-15 ENCOUNTER — Encounter (INDEPENDENT_AMBULATORY_CARE_PROVIDER_SITE_OTHER): Payer: Self-pay

## 2013-12-15 ENCOUNTER — Other Ambulatory Visit (HOSPITAL_COMMUNITY)
Admission: RE | Admit: 2013-12-15 | Discharge: 2013-12-15 | Disposition: A | Discharge status: Home / Self Care | Payer: Managed Care, Other (non HMO) | Source: Ambulatory Visit | Attending: Family Medicine | Admitting: Family Medicine

## 2013-12-15 VITALS — BP 146/84 | HR 78 | Resp 18 | Ht 63.5 in | Wt 168.1 lb

## 2013-12-15 DIAGNOSIS — R87810 Cervical high risk human papillomavirus (HPV) DNA test positive: Secondary | ICD-10-CM | POA: Insufficient documentation

## 2013-12-15 DIAGNOSIS — E1165 Type 2 diabetes mellitus with hyperglycemia: Secondary | ICD-10-CM

## 2013-12-15 DIAGNOSIS — Z1151 Encounter for screening for human papillomavirus (HPV): Secondary | ICD-10-CM | POA: Diagnosis present

## 2013-12-15 DIAGNOSIS — Z01419 Encounter for gynecological examination (general) (routine) without abnormal findings: Secondary | ICD-10-CM | POA: Insufficient documentation

## 2013-12-15 DIAGNOSIS — Z1239 Encounter for other screening for malignant neoplasm of breast: Secondary | ICD-10-CM

## 2013-12-15 DIAGNOSIS — Z1211 Encounter for screening for malignant neoplasm of colon: Secondary | ICD-10-CM

## 2013-12-15 DIAGNOSIS — IMO0002 Reserved for concepts with insufficient information to code with codable children: Secondary | ICD-10-CM

## 2013-12-15 DIAGNOSIS — Z23 Encounter for immunization: Secondary | ICD-10-CM

## 2013-12-15 DIAGNOSIS — Z124 Encounter for screening for malignant neoplasm of cervix: Secondary | ICD-10-CM

## 2013-12-15 DIAGNOSIS — E785 Hyperlipidemia, unspecified: Secondary | ICD-10-CM

## 2013-12-15 DIAGNOSIS — Z0000 Encounter for general adult medical examination without abnormal findings: Secondary | ICD-10-CM

## 2013-12-15 NOTE — Progress Notes (Signed)
   Subjective:    Patient ID: Caitlin Thompson, female    DOB: 01/06/1961, 53 y.o.   MRN: [id number]  HPI Patient is in for annual physical exam. Recent labs are reviewed, unfortunately both blood sugar and lipid values have deteriorated. Pt states she has not been as diligent with diet and exercise, still wants to defer any meds for either condition as she has  Had severe dermatitis in the past presumably a drug reaction which was never clarified as to the cause. Referrals for mammogram and eye exam are entered Flu vaccine is administered   Review of Systems See HPI C/o vaginal itch intermittently due to yeast , uses OTC meds    Objective:   Physical Exam BP 146/84  Pulse 78  Resp 18  Ht 5' 3.5" (1.613 m)  Wt 168 lb 1.9 oz (76.259 kg)  BMI 29.31 kg/m2  SpO2 94%  LMP 12/15/2013 Pleasant well nourished female, alert and oriented x 3, in no cardio-pulmonary distress. Afebrile. HEENT No facial trauma or asymetry. Sinuses non tender.  EOMI, PERTL,  External ears normal, tympanic membranes clear. Oropharynx moist, no exudate, fairly  good dentition. Neck: supple, no adenopathy,JVD or thyromegaly.No bruits.  Chest: Clear to ascultation bilaterally.No crackles or wheezes. Non tender to palpation  Breast: No asymetry,no masses or lumps. No tenderness. No nipple discharge or inversion. No axillary or supraclavicular adenopathy  Cardiovascular system; Heart sounds normal,  S1 and  S2 ,no S3.  No murmur, or thrill. Apical beat not displaced Peripheral pulses normal.  Abdomen: Soft, non tender, no organomegaly or masses. No bruits. Bowel sounds normal. No guarding, tenderness or rebound.  Rectal:  Normal sphincter tone. No mass.No rectal masses.  Guaiac negative stool.  GU: External genitalia normal female genitalia , female distribution of hair. No lesions. Urethral meatus normal in size, no  Prolapse, no lesions visibly  Present. Bladder non tender. Vagina pink  and moist , with no visible lesions , discharge present . Adequate pelvic support no  cystocele or rectocele noted Cervix pink and appears healthy, no lesions or ulcerations noted, bloody  discharge noted from os , pt on her menses Uterus normal size, no adnexal masses, no cervical motion or adnexal tenderness.   Musculoskeletal exam: Full ROM of spine, hips , shoulders and knees. No deformity ,swelling or crepitus noted. No muscle wasting or atrophy.   Neurologic: Cranial nerves 2 to 12 intact. Power, tone ,sensation and reflexes normal throughout. No disturbance in gait. No tremor.  Skin: Intact, no ulceration, erythema , scaling or rash noted. Pigmentation normal throughout  Psych; Normal mood and affect. Judgement and concentration normal        Assessment & Plan:  Encounter for annual physical exam Exam as documented. Pap sent  stool is heme negative  Need for prophylactic vaccination and inoculation against influenza Vaccine administered at visit.

## 2013-12-15 NOTE — Patient Instructions (Signed)
F/u in Jan 20 or after, call if you need me before  Blood sugar and cholesterol have increased and are too high  REMEMBER need to commit to 30 mins or more exercise every day  Food is for nutrition , healthy only, not pleasure  You are referred for mammogram and eye exam  Microalb, fasting lipid, HBa1C chem 7 and EGFR Jan 17 or after

## 2013-12-15 NOTE — Assessment & Plan Note (Signed)
Exam as documented. Pap sent  stool is heme negative

## 2013-12-15 NOTE — Assessment & Plan Note (Signed)
Vaccine administered at visit.  

## 2013-12-18 LAB — CYTOLOGY - PAP

## 2013-12-29 ENCOUNTER — Ambulatory Visit (HOSPITAL_COMMUNITY): Payer: Managed Care, Other (non HMO)

## 2013-12-29 LAB — HM DIABETES EYE EXAM

## 2014-01-01 NOTE — Progress Notes (Signed)
   Subjective:    Patient ID: Caitlin Thompson, female    DOB: 10-07-1960, 53 y.o.   MRN: [id number]  HPI No notes to record, pt did not keep appt   Review of Systems     Objective:   Physical Exam        Assessment & Plan:

## 2014-01-05 ENCOUNTER — Ambulatory Visit (HOSPITAL_COMMUNITY): Payer: Managed Care, Other (non HMO)

## 2014-01-12 ENCOUNTER — Ambulatory Visit (HOSPITAL_COMMUNITY)
Admission: RE | Admit: 2014-01-12 | Discharge: 2014-01-12 | Disposition: A | Discharge status: Home / Self Care | Payer: Managed Care, Other (non HMO) | Source: Ambulatory Visit | Attending: Family Medicine | Admitting: Family Medicine

## 2014-01-12 DIAGNOSIS — Z1239 Encounter for other screening for malignant neoplasm of breast: Secondary | ICD-10-CM

## 2014-01-12 DIAGNOSIS — Z1231 Encounter for screening mammogram for malignant neoplasm of breast: Secondary | ICD-10-CM | POA: Insufficient documentation

## 2014-03-23 ENCOUNTER — Encounter: Payer: Self-pay | Admitting: Family Medicine

## 2014-03-23 DIAGNOSIS — E119 Type 2 diabetes mellitus without complications: Secondary | ICD-10-CM

## 2014-03-23 DIAGNOSIS — E1165 Type 2 diabetes mellitus with hyperglycemia: Secondary | ICD-10-CM | POA: Insufficient documentation

## 2014-03-23 DIAGNOSIS — Z794 Long term (current) use of insulin: Secondary | ICD-10-CM

## 2014-03-25 ENCOUNTER — Telehealth: Payer: Self-pay

## 2014-03-25 NOTE — Telephone Encounter (Signed)
i faxed back the pa and am awaiting the decision

## 2014-03-25 NOTE — Telephone Encounter (Signed)
Even though we have documentation of  MULTIPLE SEVERE drug allergies causing extensive derm,atitis for months, no specific drug identified unfortunately, will the insurance not let it go through. Pt is afraid to take medication and is actually not taking what she needs to as a result. If absolutely not an option please  send in Bowman 10mg  and hopefully this will not cause a problem, pls let pt know what is going on, remove invokana once farxiga is sent pls from med list

## 2014-03-25 NOTE — Telephone Encounter (Signed)
Trying to do a PA on invokana and it states she must have tried and failed 2 alternatives which are [place] and jardiance. Do you want to change?

## 2014-03-31 LAB — LIPID PANEL
CHOL/HDL RATIO: 3.1 ratio
Cholesterol: 207 mg/dL — ABNORMAL HIGH (ref 0–200)
HDL: 66 mg/dL (ref >39)
LDL CALC: 117 mg/dL — AB (ref 0–99)
Triglycerides: 118 mg/dL (ref <150)
VLDL: 24 mg/dL (ref 0–40)

## 2014-03-31 LAB — COMPLETE METABOLIC PANEL WITH GFR
ALK PHOS: 51 U/L (ref 39–117)
ALT: 10 U/L (ref 0–35)
AST: 12 U/L (ref 0–37)
Albumin: 3.7 g/dL (ref 3.5–5.2)
BUN: 15 mg/dL (ref 6–23)
CO2: 22 mEq/L (ref 19–32)
CREATININE: 0.69 mg/dL (ref 0.50–1.10)
Calcium: 9.5 mg/dL (ref 8.4–10.5)
Chloride: 101 mEq/L (ref 96–112)
GFR, Est African American: >89 mL/min
GLUCOSE: 265 mg/dL — AB (ref 70–99)
Potassium: 4.5 mEq/L (ref 3.5–5.3)
SODIUM: 136 meq/L (ref 135–145)
Total Bilirubin: 0.5 mg/dL (ref 0.2–1.2)
Total Protein: 6.9 g/dL (ref 6.0–8.3)

## 2014-03-31 LAB — MICROALBUMIN / CREATININE URINE RATIO
Creatinine, Urine: 122.3 mg/dL
MICROALB UR: 2.1 mg/dL — AB (ref <2.0)
Microalb Creat Ratio: 17.2 mg/g (ref 0.0–30.0)

## 2014-03-31 LAB — HEMOGLOBIN A1C
Hgb A1c MFr Bld: 8.9 % — ABNORMAL HIGH (ref <5.7)
MEAN PLASMA GLUCOSE: 209 mg/dL — AB (ref <117)

## 2014-04-01 ENCOUNTER — Other Ambulatory Visit: Payer: Self-pay

## 2014-04-01 ENCOUNTER — Encounter: Payer: Self-pay | Admitting: Family Medicine

## 2014-04-01 ENCOUNTER — Ambulatory Visit (INDEPENDENT_AMBULATORY_CARE_PROVIDER_SITE_OTHER): Payer: BC Managed Care – PPO | Admitting: Family Medicine

## 2014-04-01 VITALS — BP 140/84 | HR 89 | Resp 16 | Ht 63.5 in | Wt 175.4 lb

## 2014-04-01 DIAGNOSIS — E669 Obesity, unspecified: Secondary | ICD-10-CM

## 2014-04-01 DIAGNOSIS — IMO0002 Reserved for concepts with insufficient information to code with codable children: Secondary | ICD-10-CM

## 2014-04-01 DIAGNOSIS — E1165 Type 2 diabetes mellitus with hyperglycemia: Secondary | ICD-10-CM

## 2014-04-01 DIAGNOSIS — I10 Essential (primary) hypertension: Secondary | ICD-10-CM

## 2014-04-01 DIAGNOSIS — E11319 Type 2 diabetes mellitus with unspecified diabetic retinopathy without macular edema: Secondary | ICD-10-CM

## 2014-04-01 DIAGNOSIS — Z23 Encounter for immunization: Secondary | ICD-10-CM

## 2014-04-01 DIAGNOSIS — L509 Urticaria, unspecified: Secondary | ICD-10-CM

## 2014-04-01 DIAGNOSIS — E785 Hyperlipidemia, unspecified: Secondary | ICD-10-CM

## 2014-04-01 MED ORDER — CANAGLIFLOZIN 300 MG PO TABS: 300.0000 mg | ORAL_TABLET | Freq: Every day | ORAL | Status: DC

## 2014-04-01 NOTE — Telephone Encounter (Signed)
noted 

## 2014-04-01 NOTE — Progress Notes (Signed)
   Subjective:    Patient ID: Caitlin Thompson, female    DOB: 1960-05-11, 54 y.o.   MRN: [id number]  HPI The PT is here for follow up and re-evaluation of chronic medical conditions, medication management and review of any available recent lab and radiology data.  Preventive health is updated, specifically  Cancer screening and Immunization.   Questions or concerns regarding consultations or procedures which the PT has had in the interim are  addressed. The PT denies any adverse reactions to current medications since the last visit.  There are no new concerns.  There are no specific complaints       Review of Systems See HPI Denies recent fever or chills. Denies sinus pressure, nasal congestion, ear pain or sore throat. Denies chest congestion, productive cough or wheezing. Denies chest pains, palpitations and leg swelling Denies abdominal pain, nausea, vomiting,diarrhea or constipation.   Denies dysuria, frequency, hesitancy or incontinence. Denies joint pain, swelling and limitation in mobility. Denies headaches, seizures, numbness, or tingling. Denies depression, anxiety or insomnia. Denies skin break down or rash.        Objective:   Physical Exam BP 140/84 mmHg  Pulse 89  Resp 16  Ht 5' 3.5" (1.613 m)  Wt 175 lb 6.4 oz (79.561 kg)  BMI 30.58 kg/m2  SpO2 96% Patient alert and oriented and in no cardiopulmonary distress.  HEENT: No facial asymmetry, EOMI,   oropharynx pink and moist.  Neck supple no JVD, no mass.  Chest: Clear to auscultation bilaterally.  CVS: S1, S2 no murmurs, no S3.Regular rate.  ABD: Soft non tender.   Ext: No edema  MS: Adequate ROM spine, shoulders, hips and knees.  Skin: Intact, no ulcerations or rash noted.  Psych: Good eye contact, normal affect. Memory intact not anxious or depressed appearing.  CNS: CN 2-12 intact, power,  normal throughout.no focal deficits noted.        Assessment & Plan:  Diabetes mellitus type 2,  uncontrolled Patient advised to reduce carb and sweets, commit to regular physical activity, take meds as prescribed, test blood as directed, and attempt to lose weight, to improve blood sugar control. Improved, pot applauded on this    Hyperlipidemia LDL goal <100 Improved, intolerant of statin therapy Pt applauded on this Updated lab needed at/ before next visit.    Obesity (BMI 30.0-34.9) Deteriorated. Patient re-educated about  the importance of commitment to a  minimum of 150 minutes of exercise per week. The importance of healthy food choices with portion control discussed. Encouraged to start a food diary, count calories and to consider  joining a support group. Sample diet sheets offered. Goals set by the patient for the next several months.      Urticaria Mild rash on extremities which she feels is related to recent use of transcoplolamine   Essential hypertension Improved , off of medication DASH diet and commitment to daily physical activity for a minimum of 30 minutes discussed and encouraged, as a part of hypertension management. The importance of attaining a healthy weight is also discussed.    Type 2 diabetes, uncontrolled, with retinopathy Improved Patient advised to reduce carb and sweets, commit to regular physical activity, take meds as prescribed, test blood as directed, and attempt to lose weight, to improve blood sugar control. Updated lab needed at/ before next visit.     Need for vaccination with 13-polyvalent pneumococcal conjugate vaccine Vaccine administered at visit.

## 2014-04-01 NOTE — Telephone Encounter (Signed)
PA denied.  Patient has appt this evening.  Insurance recommending [place] or [employer]

## 2014-04-01 NOTE — Patient Instructions (Signed)
F/u in 4 month, call if you need me befprw  CONGRATS  On improved blood pressure, blood sugar and cholesterol, WHAT WE EAT DETERMINES OUR HEALTH  TO A GREAT EXTENT!   Prevnar today  Fill invokana, and call back if a problem  Fasting lipid, cmp and EGFr and hBA1C in 4 month

## 2014-04-04 DIAGNOSIS — Z23 Encounter for immunization: Secondary | ICD-10-CM | POA: Insufficient documentation

## 2014-04-04 NOTE — Assessment & Plan Note (Signed)
Patient advised to reduce carb and sweets, commit to regular physical activity, take meds as prescribed, test blood as directed, and attempt to lose weight, to improve blood sugar control. Improved, pot applauded on this

## 2014-04-04 NOTE — Assessment & Plan Note (Signed)
Mild rash on extremities which she feels is related to recent use of transcoplolamine

## 2014-04-04 NOTE — Assessment & Plan Note (Signed)
Vaccine administered at visit.  

## 2014-04-04 NOTE — Assessment & Plan Note (Signed)
Improved Patient advised to reduce carb and sweets, commit to regular physical activity, take meds as prescribed, test blood as directed, and attempt to lose weight, to improve blood sugar control. Updated lab needed at/ before next visit.  

## 2014-04-04 NOTE — Assessment & Plan Note (Signed)
Deteriorated. Patient re-educated about  the importance of commitment to a  minimum of 150 minutes of exercise per week. The importance of healthy food choices with portion control discussed. Encouraged to start a food diary, count calories and to consider  joining a support group. Sample diet sheets offered. Goals set by the patient for the next several months.    

## 2014-04-04 NOTE — Assessment & Plan Note (Signed)
Improved , off of medication DASH diet and commitment to daily physical activity for a minimum of 30 minutes discussed and encouraged, as a part of hypertension management. The importance of attaining a healthy weight is also discussed.

## 2014-04-04 NOTE — Assessment & Plan Note (Signed)
Improved, intolerant of statin therapy Pt applauded on this Updated lab needed at/ before next visit.

## 2014-06-15 ENCOUNTER — Telehealth: Payer: Self-pay | Admitting: Family Medicine

## 2014-06-15 DIAGNOSIS — N3000 Acute cystitis without hematuria: Secondary | ICD-10-CM

## 2014-06-15 MED ORDER — CIPROFLOXACIN HCL 500 MG PO TABS: 500.0000 mg | ORAL_TABLET | Freq: Two times a day (BID) | ORAL | Status: DC

## 2014-06-15 NOTE — Addendum Note (Signed)
Addended by: Denman George B on: 06/15/2014 12:12 PM   Modules accepted: Orders

## 2014-06-15 NOTE — Telephone Encounter (Signed)
Patient aware and will submit specimen to the lab this evening.   Med sent to requested pharmacy.

## 2014-06-15 NOTE — Telephone Encounter (Signed)
Patient states that she is having lower back pain, pressure, and urinary odor.  She took a home test that came back positive for uti. Would like to know if she can submit urine to lab but be treated until results return.  She is unable to come in today to office due to work responsibilities.  Please advise.

## 2014-06-15 NOTE — Telephone Encounter (Signed)
pls send lab request for c/s only, and will erx cipro 500 mg twice daily #6 only

## 2014-06-17 LAB — URINE CULTURE
Colony Count: NO GROWTH
ORGANISM ID, BACTERIA: NO GROWTH

## 2014-07-07 ENCOUNTER — Encounter: Payer: Self-pay | Admitting: Family Medicine

## 2014-07-07 ENCOUNTER — Ambulatory Visit (INDEPENDENT_AMBULATORY_CARE_PROVIDER_SITE_OTHER): Payer: Managed Care, Other (non HMO) | Admitting: Family Medicine

## 2014-07-07 ENCOUNTER — Ambulatory Visit (HOSPITAL_COMMUNITY)
Admission: RE | Admit: 2014-07-07 | Discharge: 2014-07-07 | Disposition: A | Discharge status: Home / Self Care | Payer: BLUE CROSS/BLUE SHIELD | Source: Ambulatory Visit | Attending: Family Medicine | Admitting: Family Medicine

## 2014-07-07 VITALS — BP 140/82 | HR 100 | Temp 99.3°F | Resp 18 | Ht 63.5 in | Wt 171.1 lb

## 2014-07-07 DIAGNOSIS — R05 Cough: Secondary | ICD-10-CM | POA: Diagnosis present

## 2014-07-07 DIAGNOSIS — I10 Essential (primary) hypertension: Secondary | ICD-10-CM | POA: Diagnosis not present

## 2014-07-07 DIAGNOSIS — E1165 Type 2 diabetes mellitus with hyperglycemia: Secondary | ICD-10-CM

## 2014-07-07 DIAGNOSIS — E663 Overweight: Secondary | ICD-10-CM

## 2014-07-07 DIAGNOSIS — J209 Acute bronchitis, unspecified: Secondary | ICD-10-CM | POA: Diagnosis not present

## 2014-07-07 DIAGNOSIS — E11319 Type 2 diabetes mellitus with unspecified diabetic retinopathy without macular edema: Secondary | ICD-10-CM | POA: Diagnosis not present

## 2014-07-07 DIAGNOSIS — IMO0002 Reserved for concepts with insufficient information to code with codable children: Secondary | ICD-10-CM

## 2014-07-07 DIAGNOSIS — E785 Hyperlipidemia, unspecified: Secondary | ICD-10-CM | POA: Diagnosis not present

## 2014-07-07 MED ORDER — LEVOFLOXACIN 500 MG PO TABS: 500.0000 mg | ORAL_TABLET | Freq: Every day | ORAL | Status: DC

## 2014-07-07 NOTE — Patient Instructions (Addendum)
F/u in 4 month, call if  You need me before  HBA1C, chem 7   and EGFR and TSH today   You are treated for acute bronchitis, levaquin is prescribed for 1 week, take one tablet once daily  CXR today please  Fasting lipid, cmp and EGFR, hBA1C in 4 month  Hope you feel better soon, and all the best  For  The future  Thanks for choosing [facility], we consider it a privelige to serve you.

## 2014-07-08 LAB — TSH: TSH: 0.658 u[IU]/mL (ref 0.350–4.500)

## 2014-07-08 LAB — COMPLETE METABOLIC PANEL WITH GFR
ALT: 12 U/L (ref 0–35)
AST: 12 U/L (ref 0–37)
Albumin: 4.1 g/dL (ref 3.5–5.2)
Alkaline Phosphatase: 66 U/L (ref 39–117)
BUN: 17 mg/dL (ref 6–23)
CALCIUM: 9.2 mg/dL (ref 8.4–10.5)
CO2: 20 mEq/L (ref 19–32)
Chloride: 100 mEq/L (ref 96–112)
Creat: 0.77 mg/dL (ref 0.50–1.10)
GFR, Est Non African American: >89 mL/min
Glucose, Bld: 340 mg/dL — ABNORMAL HIGH (ref 70–99)
POTASSIUM: 4 meq/L (ref 3.5–5.3)
SODIUM: 136 meq/L (ref 135–145)
Total Bilirubin: 0.4 mg/dL (ref 0.2–1.2)
Total Protein: 7.9 g/dL (ref 6.0–8.3)

## 2014-07-08 LAB — HEMOGLOBIN A1C
HEMOGLOBIN A1C: 10 % — AB (ref <5.7)
Mean Plasma Glucose: 240 mg/dL — ABNORMAL HIGH (ref <117)

## 2014-07-29 ENCOUNTER — Ambulatory Visit: Payer: BLUE CROSS/BLUE SHIELD | Admitting: Family Medicine

## 2014-08-11 NOTE — Assessment & Plan Note (Signed)
Uncontrolled, curently on no medication DASH diet and commitment to daily physical activity for a minimum of 30 minutes discussed and encouraged, as a part of hypertension management. The importance of attaining a healthy weight is also discussed.  BP/Weight 07/07/2014 04/01/2014 12/15/2013 09/09/2013 08/23/2013 08/21/2013 1/84/0375  Systolic BP 436 067 703 403 524 - -  Diastolic BP 82 84 84 82 84 - -  Wt. (Lbs) 171.12 175.4 168.12 171.12 - 170.64 -  BMI 29.83 30.58 29.31 29.83 - - 29.75

## 2014-08-11 NOTE — Assessment & Plan Note (Signed)
Deteriorated, has not been taking prescribed medication and has extreme anxiety bout potential allergic reaction to prescription medications . Will resume invokana and intensive lifestyle therapy

## 2014-08-11 NOTE — Assessment & Plan Note (Signed)
Hyperlipidemia:Low fat diet discussed and encouraged. Uncontrolled , resists use of statin therapy due to high allergic reaction   Lipid Panel  Lab Results  Component Value Date   CHOL 207* 03/30/2014   HDL 66 03/30/2014   LDLCALC 117* 03/30/2014   TRIG 118 03/30/2014   CHOLHDL 3.1 03/30/2014

## 2014-08-11 NOTE — Assessment & Plan Note (Signed)
Deteriorated. Patient re-educated about  the importance of commitment to a  minimum of 150 minutes of exercise per week.  The importance of healthy food choices with portion control discussed. Encouraged to start a food diary, count calories and to consider  joining a support group. Sample diet sheets offered. Goals set by the patient for the next several months.   Weight /BMI 07/07/2014 04/01/2014 12/15/2013  WEIGHT 171 lb 1.9 oz 175 lb 6.4 oz 168 lb 1.9 oz  HEIGHT 5' 3.5" 5' 3.5" 5' 3.5"  BMI 29.83 kg/m2 30.58 kg/m2 29.31 kg/m2    Current exercise per week 90 minutes.

## 2014-08-11 NOTE — Assessment & Plan Note (Signed)
CXR and 1 week antibiotic course

## 2014-08-11 NOTE — Progress Notes (Signed)
Subjective:    Patient ID: Caitlin Thompson, female    DOB: 11-19-1960, 54 y.o.   MRN: [id number]  HPI 1 week of chest congestion, cough , sputum with chills and intermittent fever , no response to OTC meds. Denies sinus pressure, ear [pain or sore throat Denies polyuria, polydipsia, blurred vision , or hypoglycemic episodes. Not testing regularly and not taking her med Increased stress due to recent lay off   Review of Systems See HPI  Denies chest pains, palpitations and leg swelling Denies abdominal pain, nausea, vomiting,diarrhea or constipation.   Denies dysuria, frequency, hesitancy or incontinence. Denies joint pain, swelling and limitation in mobility. Denies headaches, seizures, numbness, or tingling. Denies skin break down or rash.        Objective:   Physical Exam BP 140/82 mmHg  Pulse 100  Temp(Src) 99.3 F (37.4 C)  Resp 18  Ht 5' 3.5" (1.613 m)  Wt 171 lb 1.9 oz (77.62 kg)  BMI 29.83 kg/m2  SpO2 98% Patient alert and oriented and in no cardiopulmonary distress.  HEENT: No facial asymmetry, EOMI,   oropharynx pink and moist.  Neck supple no JVD, no mass.  Chest: Decreased air entryt , bilateral crackles , no wheezes CVS: S1, S2 no murmurs, no S3.Regular rate.  ABD: Soft non tender.   Ext: No edema  MS: Adequate ROM spine, shoulders, hips and knees.  Skin: Intact, no ulcerations or rash noted.  Psych: Good eye contact, normal affect. Memory intact not anxious or depressed appearing.  CNS: CN 2-12 intact, power,  normal throughout.no focal deficits noted.        Assessment & Plan:  Acute bronchitis CXR and 1 week antibiotic course   Type 2 diabetes, uncontrolled, with retinopathy Deteriorated, has not been taking prescribed medication and has extreme anxiety bout potential allergic reaction to prescription medications . Will resume invokana and intensive lifestyle therapy   Obesity (BMI 30.0-34.9) Deteriorated. Patient re-educated  about  the importance of commitment to a  minimum of 150 minutes of exercise per week.  The importance of healthy food choices with portion control discussed. Encouraged to start a food diary, count calories and to consider  joining a support group. Sample diet sheets offered. Goals set by the patient for the next several months.   Weight /BMI 07/07/2014 04/01/2014 12/15/2013  WEIGHT 171 lb 1.9 oz 175 lb 6.4 oz 168 lb 1.9 oz  HEIGHT 5' 3.5" 5' 3.5" 5' 3.5"  BMI 29.83 kg/m2 30.58 kg/m2 29.31 kg/m2    Current exercise per week 90 minutes.    Essential hypertension Uncontrolled, curently on no medication DASH diet and commitment to daily physical activity for a minimum of 30 minutes discussed and encouraged, as a part of hypertension management. The importance of attaining a healthy weight is also discussed.  BP/Weight 07/07/2014 04/01/2014 12/15/2013 09/09/2013 08/23/2013 08/21/2013 11/27/2681  Systolic BP 419 622 297 989 211 - -  Diastolic BP 82 84 84 82 84 - -  Wt. (Lbs) 171.12 175.4 168.12 171.12 - 170.64 -  BMI 29.83 30.58 29.31 29.83 - - 29.75         Hyperlipidemia LDL goal <100 Hyperlipidemia:Low fat diet discussed and encouraged. Uncontrolled , resists use of statin therapy due to high allergic reaction   Lipid Panel  Lab Results  Component Value Date   CHOL 207* 03/30/2014   HDL 66 03/30/2014   LDLCALC 117* 03/30/2014   TRIG 118 03/30/2014   CHOLHDL 3.1 03/30/2014

## 2014-10-02 ENCOUNTER — Telehealth: Payer: Self-pay

## 2014-10-02 NOTE — Telephone Encounter (Signed)
States she got stung by a wasp a month ago and its still itching and giving her a fit, now shes breaking out in hives around the area and even on her arms, cannot take po prednisone. Wants to know if she can come in Monday for depo 80mg ?

## 2014-10-02 NOTE — Telephone Encounter (Signed)
If able to wait for 3 days , yes, but if reaction is very severe, needs to go to urgent care

## 2014-10-02 NOTE — Telephone Encounter (Signed)
Patient aware. Msg left on voicemail

## 2014-10-05 ENCOUNTER — Ambulatory Visit (INDEPENDENT_AMBULATORY_CARE_PROVIDER_SITE_OTHER): Payer: BLUE CROSS/BLUE SHIELD

## 2014-10-05 DIAGNOSIS — R21 Rash and other nonspecific skin eruption: Secondary | ICD-10-CM | POA: Diagnosis not present

## 2014-10-05 MED ORDER — METHYLPREDNISOLONE ACETATE 80 MG/ML IJ SUSP
80.0000 mg | Freq: Once | INTRAMUSCULAR | Status: AC
  Administered 2014-10-05: 80 mg via INTRAMUSCULAR

## 2014-10-05 NOTE — Progress Notes (Signed)
Patient received injection with no complications.

## 2014-10-07 ENCOUNTER — Ambulatory Visit: Payer: BLUE CROSS/BLUE SHIELD | Admitting: Family Medicine

## 2014-10-16 LAB — LIPID PANEL
Cholesterol: 224 mg/dL — ABNORMAL HIGH (ref 125–200)
HDL: 65 mg/dL (ref >=46)
LDL CALC: 134 mg/dL — AB (ref <130)
Total CHOL/HDL Ratio: 3.4 Ratio (ref <=5.0)
Triglycerides: 126 mg/dL (ref <150)
VLDL: 25 mg/dL (ref <30)

## 2014-10-16 LAB — COMPLETE METABOLIC PANEL WITH GFR
ALT: 9 U/L (ref 6–29)
AST: 12 U/L (ref 10–35)
Albumin: 3.8 g/dL (ref 3.6–5.1)
Alkaline Phosphatase: 62 U/L (ref 33–130)
BILIRUBIN TOTAL: 0.5 mg/dL (ref 0.2–1.2)
BUN: 12 mg/dL (ref 7–25)
CALCIUM: 9.3 mg/dL (ref 8.6–10.4)
CO2: 24 mmol/L (ref 20–31)
CREATININE: 0.71 mg/dL (ref 0.50–1.05)
Chloride: 99 mmol/L (ref 98–110)
GFR, Est African American: >89 mL/min (ref >=60)
GFR, Est Non African American: >89 mL/min (ref >=60)
Glucose, Bld: 287 mg/dL — ABNORMAL HIGH (ref 65–99)
Potassium: 4.7 mmol/L (ref 3.5–5.3)
Sodium: 135 mmol/L (ref 135–146)
Total Protein: 7.4 g/dL (ref 6.1–8.1)

## 2014-10-16 LAB — HEMOGLOBIN A1C
HEMOGLOBIN A1C: 10.2 % — AB (ref <5.7)
MEAN PLASMA GLUCOSE: 246 mg/dL — AB (ref <117)

## 2014-10-19 ENCOUNTER — Ambulatory Visit: Payer: BLUE CROSS/BLUE SHIELD | Admitting: Family Medicine

## 2014-11-04 ENCOUNTER — Encounter: Payer: Self-pay | Admitting: *Deleted

## 2014-11-04 ENCOUNTER — Ambulatory Visit: Payer: BLUE CROSS/BLUE SHIELD | Admitting: Family Medicine

## 2015-03-10 ENCOUNTER — Telehealth: Payer: Self-pay

## 2015-03-10 ENCOUNTER — Other Ambulatory Visit: Payer: Self-pay

## 2015-03-10 MED ORDER — CLINDAMYCIN HCL 300 MG PO CAPS: 300.0000 mg | ORAL_CAPSULE | Freq: Three times a day (TID) | ORAL | Status: DC

## 2015-03-10 NOTE — Telephone Encounter (Signed)
Cleocin 3 times daily has been entered. Explain NEED to go to ED if her infection is not responding She is and uncontrolled  diabetic, she has multiple allergies on file, and infection inadequately treated or un treated may cause loss of limb or life so this is extremely serious

## 2015-03-16 ENCOUNTER — Ambulatory Visit: Payer: BLUE CROSS/BLUE SHIELD | Admitting: Family Medicine

## 2015-03-18 ENCOUNTER — Ambulatory Visit (HOSPITAL_COMMUNITY)
Admission: RE | Admit: 2015-03-18 | Discharge: 2015-03-18 | Disposition: A | Discharge status: Home / Self Care | Payer: Commercial Managed Care - HMO | Source: Ambulatory Visit | Attending: Family Medicine | Admitting: Family Medicine

## 2015-03-18 ENCOUNTER — Ambulatory Visit (INDEPENDENT_AMBULATORY_CARE_PROVIDER_SITE_OTHER): Payer: Commercial Managed Care - HMO | Admitting: Family Medicine

## 2015-03-18 ENCOUNTER — Encounter: Payer: Self-pay | Admitting: Family Medicine

## 2015-03-18 ENCOUNTER — Other Ambulatory Visit: Payer: Self-pay | Admitting: Family Medicine

## 2015-03-18 ENCOUNTER — Other Ambulatory Visit (HOSPITAL_COMMUNITY)
Admission: RE | Admit: 2015-03-18 | Discharge: 2015-03-18 | Disposition: A | Discharge status: Home / Self Care | Payer: Commercial Managed Care - HMO | Attending: Family Medicine | Admitting: Family Medicine

## 2015-03-18 VITALS — BP 130/84 | HR 97 | Temp 98.6°F | Resp 16 | Ht 64.0 in | Wt 173.0 lb

## 2015-03-18 DIAGNOSIS — Z23 Encounter for immunization: Secondary | ICD-10-CM | POA: Diagnosis not present

## 2015-03-18 DIAGNOSIS — L089 Local infection of the skin and subcutaneous tissue, unspecified: Secondary | ICD-10-CM

## 2015-03-18 DIAGNOSIS — L97509 Non-pressure chronic ulcer of other part of unspecified foot with unspecified severity: Secondary | ICD-10-CM | POA: Insufficient documentation

## 2015-03-18 DIAGNOSIS — E11311 Type 2 diabetes mellitus with unspecified diabetic retinopathy with macular edema: Secondary | ICD-10-CM

## 2015-03-18 DIAGNOSIS — E785 Hyperlipidemia, unspecified: Secondary | ICD-10-CM | POA: Insufficient documentation

## 2015-03-18 DIAGNOSIS — E11621 Type 2 diabetes mellitus with foot ulcer: Secondary | ICD-10-CM | POA: Diagnosis not present

## 2015-03-18 DIAGNOSIS — Z114 Encounter for screening for human immunodeficiency virus [HIV]: Secondary | ICD-10-CM

## 2015-03-18 DIAGNOSIS — L03039 Cellulitis of unspecified toe: Secondary | ICD-10-CM | POA: Diagnosis not present

## 2015-03-18 DIAGNOSIS — E1169 Type 2 diabetes mellitus with other specified complication: Secondary | ICD-10-CM

## 2015-03-18 DIAGNOSIS — L03115 Cellulitis of right lower limb: Secondary | ICD-10-CM

## 2015-03-18 DIAGNOSIS — M869 Osteomyelitis, unspecified: Secondary | ICD-10-CM

## 2015-03-18 DIAGNOSIS — E1165 Type 2 diabetes mellitus with hyperglycemia: Secondary | ICD-10-CM

## 2015-03-18 DIAGNOSIS — I10 Essential (primary) hypertension: Secondary | ICD-10-CM

## 2015-03-18 DIAGNOSIS — S90421A Blister (nonthermal), right great toe, initial encounter: Secondary | ICD-10-CM | POA: Diagnosis present

## 2015-03-18 DIAGNOSIS — Z1231 Encounter for screening mammogram for malignant neoplasm of breast: Secondary | ICD-10-CM

## 2015-03-18 DIAGNOSIS — Z1159 Encounter for screening for other viral diseases: Secondary | ICD-10-CM

## 2015-03-18 LAB — LIPID PANEL
CHOL/HDL RATIO: 4.9 ratio
Cholesterol: 178 mg/dL (ref 0–200)
HDL: 36 mg/dL — ABNORMAL LOW (ref >40)
LDL Cholesterol: 78 mg/dL (ref 0–99)
TRIGLYCERIDES: 322 mg/dL — AB (ref <150)
VLDL: 64 mg/dL — ABNORMAL HIGH (ref 0–40)

## 2015-03-18 LAB — COMPREHENSIVE METABOLIC PANEL
ALBUMIN: 3.7 g/dL (ref 3.5–5.0)
ALT: 17 U/L (ref 14–54)
ANION GAP: 11 (ref 5–15)
AST: 15 U/L (ref 15–41)
Alkaline Phosphatase: 97 U/L (ref 38–126)
BILIRUBIN TOTAL: 0.7 mg/dL (ref 0.3–1.2)
BUN: 21 mg/dL — ABNORMAL HIGH (ref 6–20)
CO2: 23 mmol/L (ref 22–32)
Calcium: 9.7 mg/dL (ref 8.9–10.3)
Chloride: 95 mmol/L — ABNORMAL LOW (ref 101–111)
Creatinine, Ser: 1.1 mg/dL — ABNORMAL HIGH (ref 0.44–1.00)
GFR calc Af Amer: >60 mL/min (ref >60)
GFR calc non Af Amer: 57 mL/min — ABNORMAL LOW (ref >60)
GLUCOSE: 562 mg/dL — AB (ref 65–99)
Potassium: 4.4 mmol/L (ref 3.5–5.1)
Sodium: 129 mmol/L — ABNORMAL LOW (ref 135–145)
TOTAL PROTEIN: 9 g/dL — AB (ref 6.5–8.1)

## 2015-03-18 LAB — CBC WITH DIFFERENTIAL/PLATELET
BASOS ABS: 0 10*3/uL (ref 0.0–0.1)
Basophils Relative: 0 %
Eosinophils Absolute: 0.5 10*3/uL (ref 0.0–0.7)
Eosinophils Relative: 4 %
HEMATOCRIT: 38.7 % (ref 36.0–46.0)
HEMOGLOBIN: 13.1 g/dL (ref 12.0–15.0)
LYMPHS PCT: 20 %
Lymphs Abs: 2.3 10*3/uL (ref 0.7–4.0)
MCH: 31.6 pg (ref 26.0–34.0)
MCHC: 33.9 g/dL (ref 30.0–36.0)
MCV: 93.5 fL (ref 78.0–100.0)
Monocytes Absolute: 0.6 10*3/uL (ref 0.1–1.0)
Monocytes Relative: 5 %
NEUTROS ABS: 8.3 10*3/uL — AB (ref 1.7–7.7)
Neutrophils Relative %: 71 %
PLATELETS: 433 10*3/uL — AB (ref 150–400)
RBC: 4.14 MIL/uL (ref 3.87–5.11)
RDW: 11.5 % (ref 11.5–15.5)
WBC: 11.8 10*3/uL — ABNORMAL HIGH (ref 4.0–10.5)

## 2015-03-18 MED ORDER — INSULIN ASPART 100 UNIT/ML ~~LOC~~ SOLN
10.0000 [IU] | Freq: Once | SUBCUTANEOUS | Status: AC
  Administered 2015-03-18: 10 [IU] via SUBCUTANEOUS

## 2015-03-18 MED ORDER — METFORMIN HCL 500 MG PO TABS: 500.0000 mg | ORAL_TABLET | Freq: Two times a day (BID) | ORAL | Status: DC

## 2015-03-18 MED ORDER — INSULIN GLARGINE 100 UNIT/ML SOLOSTAR PEN: 7.0000 [IU] | PEN_INJECTOR | Freq: Every day | SUBCUTANEOUS | Status: DC

## 2015-03-18 MED ORDER — CANAGLIFLOZIN 300 MG PO TABS: 300.0000 mg | ORAL_TABLET | Freq: Every day | ORAL | Status: DC

## 2015-03-18 NOTE — Progress Notes (Signed)
Subjective:    Patient ID: Caitlin Thompson, female    DOB: 1960-04-07, 55 y.o.   MRN: [id number]  HPI Injured right foot at home in October 2016, states initially it was painful, swollen and had a "small bump" which seemed to have gotten better. Approximately 2 weeks ago, her daughter extruded green foul smelling pus form the right great toe, and she has been experiencing fatigue and chills, called in for help and was started on oraL [place], states she had redness, warmth and pain on right lower extremity from foot to thigh.Has resorted to an open boot unable to place foot in shoe and was unable to weight bear at one time. Has been cleaning and dressing at home Has been on no medication otherwise, was out of insurance, is aware of uncontrolled diabetes status, but has a phobia for medication as she has in the past developed severe rash almost over entire body   Review of Systems See HPI  Denies sinus pressure, nasal congestion, ear pain or sore throat. Denies chest congestion, productive cough or wheezing. Denies chest pains, palpitations and leg swelling Denies abdominal pain, nausea, vomiting,diarrhea or constipation.   Denies dysuria, frequency, hesitancy or incontinence.  Denies headaches, seizures, numbness, or tingling.       Objective:   Physical Exam BP 130/84 mmHg  Pulse 97  Temp(Src) 98.6 F (37 C) (Oral)  Resp 16  Ht 5\' 4"  (1.626 m)  Wt 173 lb (78.472 kg)  BMI 29.68 kg/m2  SpO2 96% Patient alert and oriented and in no cardiopulmonary distress.  HEENT: No facial asymmetry, EOMI,   oropharynx pink and moist.  Neck supple no JVD, no mass.  Chest: Clear to auscultation bilaterally.  CVS: S1, S2 no murmurs, no S3.Regular rate.  ABD: Soft non tender.   Ext: No edema  MS: Adequate ROM spine, shoulders, hips and knees.  Skin: open lesion on right great toe, no pus extruded at visit, mild warmth and erythema of right  foot and distal aspect right leg.  Psych: Good eye contact, normal affect. Memory intact not anxious or depressed appearing.  CNS: CN 2-12 intact, power,  normal throughout.no focal deficits noted.        Assessment & Plan:  Infected blister of great toe of right foot 2 week h/o foul smelling purulent drainage from mright great toe, first hit the foot in shower about 2 months ago, has been feeling increasing tired with chills, redness and warmth to right thigh and swelling last week, good response to antibiotic, but still open lesion with drainage, continue cleocin, urgent appt at wound center  Diabetic osteomyelitis ([place]) Currently on oral cleocin, needs urgent wound consult for assistance in management, pt aware and this is actively being addressed  Type 2 diabetes, uncontrolled, with retinopathy Markedly uncontrolled and has deteriorated. Pt has reported in the past MULTIPLE drug allergies, uncertain as to exact drug. Willing to start with lantus only, came in day after visit for teaching and is to call in weekly with values , will up titrate every 3 days  Hyperlipidemia LDL goal <100 Hyperlipidemia:Low fat diet discussed and encouraged.   Lipid Panel  Lab Results  Component Value Date   CHOL 178 03/18/2015   HDL 36* 03/18/2015   LDLCALC 78 03/18/2015   TRIG 322* 03/18/2015   CHOLHDL 4.9 03/18/2015   Needs to reduce cheese, butter , egg yolk     Cellulitis of leg, right Warmth and mild  erythema noted on right foot and lower 1/5 of rigth leg, pt reports marked improvem,ent in extent of skin involvement since starting cleocin, wBC on day of visit showed elevated WBC with shift, will rept at later stage

## 2015-03-18 NOTE — Patient Instructions (Addendum)
F/u in 6 weeks call if you need me sooner  You are referred urgently to wound center  Flu vaccine and Tetanus  Injection today  Ciontinue antibiotic course  Labs and xray today, please schedule your mammogram when you go to the x ray dept today

## 2015-03-18 NOTE — Assessment & Plan Note (Addendum)
2 week h/o foul smelling purulent drainage from mright great toe, first hit the foot in shower about 2 months ago, has been feeling increasing tired with chills, redness and warmth to right thigh and swelling last week, good response to antibiotic, but still open lesion with drainage, continue cleocin, urgent appt at wound center

## 2015-03-19 ENCOUNTER — Telehealth: Payer: Self-pay | Admitting: Family Medicine

## 2015-03-19 DIAGNOSIS — L03115 Cellulitis of right lower limb: Secondary | ICD-10-CM | POA: Insufficient documentation

## 2015-03-19 DIAGNOSIS — E1169 Type 2 diabetes mellitus with other specified complication: Secondary | ICD-10-CM | POA: Insufficient documentation

## 2015-03-19 DIAGNOSIS — M869 Osteomyelitis, unspecified: Secondary | ICD-10-CM

## 2015-03-19 LAB — HEMOGLOBIN A1C
HEMOGLOBIN A1C: 13 % — AB (ref 4.8–5.6)
MEAN PLASMA GLUCOSE: 326 mg/dL

## 2015-03-19 MED ORDER — INSULIN GLARGINE 100 UNIT/ML SOLOSTAR PEN: PEN_INJECTOR | SUBCUTANEOUS | Status: DC

## 2015-03-19 NOTE — Assessment & Plan Note (Signed)
Hyperlipidemia:Low fat diet discussed and encouraged.   Lipid Panel  Lab Results  Component Value Date   CHOL 178 03/18/2015   HDL 36* 03/18/2015   LDLCALC 78 03/18/2015   TRIG 322* 03/18/2015   CHOLHDL 4.9 03/18/2015   Needs to reduce cheese, butter , egg yolk

## 2015-03-19 NOTE — Telephone Encounter (Signed)
Pt coming by this am for your diabetic education, she will need  To start the lantus at 10 units every day, script says 50 , do NOT start there. Needs to inc every 3 days by 3 units if FBG is over 140  Goal for her fasting blood sugar is 90 to 130  Needs to record numbers, needs to test 3 times daily and record  Goal for fasting blood sugar ranges from 90 to 130 and 2 hours after any meal or at bedtime should be between 140 to 180.  Needs to be called once every week, review numbers etc  Needs official diabetic education through [facility]

## 2015-03-19 NOTE — Assessment & Plan Note (Signed)
Warmth and mild erythema noted on right foot and lower 1/5 of rigth leg, pt reports marked improvem,ent in extent of skin involvement since starting cleocin, wBC on day of visit showed elevated WBC with shift, will rept at later stage

## 2015-03-19 NOTE — Assessment & Plan Note (Signed)
Currently on oral cleocin, needs urgent wound consult for assistance in management, pt aware and this is actively being addressed

## 2015-03-19 NOTE — Assessment & Plan Note (Signed)
Markedly uncontrolled and has deteriorated. Pt has reported in the past MULTIPLE drug allergies, uncertain as to exact drug. Willing to start with lantus only, came in day after visit for teaching and is to call in weekly with values , will up titrate every 3 days

## 2015-03-19 NOTE — Telephone Encounter (Signed)
Noted.  Will prepare for patient to come in.

## 2015-03-19 NOTE — Addendum Note (Signed)
Addended by: Denman George B on: 03/19/2015 04:23 PM   Modules accepted: Orders

## 2015-03-22 ENCOUNTER — Encounter: Payer: Commercial Managed Care - HMO | Attending: Surgery | Admitting: Surgery

## 2015-03-22 DIAGNOSIS — E1165 Type 2 diabetes mellitus with hyperglycemia: Secondary | ICD-10-CM | POA: Diagnosis not present

## 2015-03-22 DIAGNOSIS — L97514 Non-pressure chronic ulcer of other part of right foot with necrosis of bone: Secondary | ICD-10-CM | POA: Insufficient documentation

## 2015-03-22 DIAGNOSIS — E11621 Type 2 diabetes mellitus with foot ulcer: Secondary | ICD-10-CM | POA: Diagnosis present

## 2015-03-22 DIAGNOSIS — M86471 Chronic osteomyelitis with draining sinus, right ankle and foot: Secondary | ICD-10-CM | POA: Diagnosis not present

## 2015-03-22 NOTE — Progress Notes (Addendum)
ARRILLA, ARMATO ([id number]) Visit Report for 03/22/2015 Chief Complaint Document Details Patient Name: Caitlin Thompson, Caitlin Thompson Date of Service: 03/22/2015 8:45 AM Medical Record Number: [id number] Patient Account Number: [id number] Date of Birth/Sex: 08/05/1960 (55 y.o. Female) Treating RN: Carolyne Fiscal, Debi Primary Care Physician: Tula Nakayama Other Clinician: Referring Physician: Tula Nakayama Treating Physician/Extender: Frann Rider in Treatment: 0 Information Obtained from: Patient Chief Complaint Patients presents for treatment of an open diabetic ulcer to her right big toe which she's had for about 2 months Electronic Signature(s) Signed: 03/22/2015 10:41:50 AM By: Christin Fudge MD, FACS Entered By: Christin Fudge on 03/22/2015 10:41:49 Caitlin Thompson ([id number]) -------------------------------------------------------------------------------- HPI Details Patient Name: Caitlin Thompson Date of Service: 03/22/2015 8:45 AM Medical Record Number: [id number] Patient Account Number: [id number] Date of Birth/Sex: [birth year] (54 y.o. Female) Treating RN: Carolyne Fiscal, Debi Primary Care Physician: Tula Nakayama Other Clinician: Referring Physician: Tula Nakayama Treating Physician/Extender: Frann Rider in Treatment: 0 History of Present Illness Location: the right big toe has a draining sinus and has been draining purulent material Quality: Patient reports experiencing a dull pain to affected area(s). Severity: Patient states wound are getting worse. Duration: Patient has had the wound for > 2 months prior to seeking treatment at the wound center Timing: Pain in wound is Intermittent (comes and goes Context: The wound occurred when the patient had a blunt injury to the toe which continued to have draining fluid and has recently had x-rays Modifying Factors: Other treatment(s) tried include: her PCP saw her recently got an x-ray done and put her on  oral clindamycin Associated Signs and Symptoms: Patient reports having increase discharge. HPI Description: 55 year old patient known to have uncontrolled diabetes mellitus, who was recently seen by her PCP Dr. Tula Nakayama with a two-week history of foul-smelling purulent drainage from the right great toe which was injured bluntly about 2 months ago. The patient has had clindamycin given and that reduced some of the drainage and pus. recent hemoglobin A1c was 13 She is not a smoker. x-ray of the right foot done on 03/18/2015 -- IMPRESSION: Osteomyelitis of the tuft of the distal phalanx of the great toe with surrounding cellulitis. Electronic Signature(s) Signed: 03/22/2015 10:43:35 AM By: Christin Fudge MD, FACS Previous Signature: 03/22/2015 9:10:39 AM Version By: Christin Fudge MD, FACS Entered By: Christin Fudge on 03/22/2015 10:43:35 Caitlin Thompson ([id number]) -------------------------------------------------------------------------------- Physical Exam Details Patient Name: Caitlin Thompson Date of Service: 03/22/2015 8:45 AM Medical Record Number: [id number] Patient Account Number: [id number] Date of Birth/Sex: February 02, 1961 (54 y.o. Female) Treating RN: Carolyne Fiscal, Debi Primary Care Physician: Tula Nakayama Other Clinician: Referring Physician: Tula Nakayama Treating Physician/Extender: Frann Rider in Treatment: 0 Constitutional . Pulse regular. Respirations normal and unlabored. Afebrile. . Eyes Nonicteric. Reactive to light. Ears, Nose, Mouth, and Throat Lips, teeth, and gums WNL.Marland Kitchen Moist mucosa without lesions. Neck supple and nontender. No palpable supraclavicular or cervical adenopathy. Normal sized without goiter. Respiratory WNL. No retractions.. Cardiovascular Pedal Pulses WNL. ABI on the right is 1.07. No clubbing, cyanosis or edema. Gastrointestinal (GI) Abdomen without masses or tenderness.. No liver or spleen enlargement or  tenderness.. Lymphatic No adneopathy. No adenopathy. No adenopathy. Musculoskeletal Adexa without tenderness or enlargement.. Digits and nails w/o clubbing, cyanosis, infection, petechiae, ischemia, or inflammatory conditions.. Integumentary (Hair, Skin) No suspicious lesions. No crepitus or fluctuance. No peri-wound warmth or erythema. No masses.Marland Kitchen Psychiatric Judgement and insight Intact.. No evidence of depression, anxiety, or agitation.. Notes the right big toe is swollen and has a previous  deformity due to an injury several years ago. She also has a large ulcerated sinus tract at the tip of the toe which probes down deeply to about 2 cm and is down to bone. There is serous sanguinous fluid draining out of this. Electronic Signature(s) Signed: 03/22/2015 10:44:44 AM By: Christin Fudge MD, FACS Entered By: Christin Fudge on 03/22/2015 10:44:44 Caitlin Thompson ([id number]) -------------------------------------------------------------------------------- Physician Orders Details Patient Name: Caitlin Thompson Date of Service: 03/22/2015 8:45 AM Medical Record Number: [id number] Patient Account Number: [id number] Date of Birth/Sex: 17-Sep-1960 (54 y.o. Female) Treating RN: Carolyne Fiscal, Debi Primary Care Physician: Tula Nakayama Other Clinician: Referring Physician: Tula Nakayama Treating Physician/Extender: Frann Rider in Treatment: 0 Verbal / Phone Orders: Yes Clinician: Pinkerton, Debi Read Back and Verified: Yes Diagnosis Coding Wound Cleansing Wound #1 Right Toe Great o Clean wound with Normal Saline. Anesthetic Wound #1 Right Toe Great o Topical Lidocaine 4% cream applied to wound bed prior to debridement Primary Wound Dressing Wound #1 Right Toe Great o Iodoform packing Gauze - 1/4 inch Secondary Dressing Wound #1 Right Toe Great o Gauze and Kerlix/Conform Dressing Change Frequency Wound #1 Right Toe Great o Change dressing every  day. Follow-up Appointments Wound #1 Right Toe Great o Return Appointment in 1 week. Edema Control Wound #1 Right Toe Great o Patient to wear own Juxtalite/Juzo compression garment. Notes Ref- to Dr. Loretha Stapler podiatry as soon as can be seen. Electronic Signature(s) Signed: 03/22/2015 4:49:11 PM By: Christin Fudge MD, FACS Signed: 03/22/2015 5:14:45 PM By: Tomasita Morrow, Roosevelt Locks ([id number]) Entered By: Alric Quan on 03/22/2015 10:22:45 Caitlin Thompson ([id number]) -------------------------------------------------------------------------------- Problem List Details Patient Name: Caitlin Thompson Date of Service: 03/22/2015 8:45 AM Medical Record Number: [id number] Patient Account Number: [id number] Date of Birth/Sex: 14-Aug-1960 (54 y.o. 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 Female) Treating RN: Carolyne Fiscal, Debi Primary Care Physician: Tula Nakayama Other Clinician: Referring Physician:  Tula Nakayama Treating Physician/Extender: Frann Rider in Treatment: 0 Subjective Chief Complaint Information obtained from Patient Patients presents for treatment of an open diabetic ulcer to her right big toe which she's had for about 2 months History of Present Illness (HPI) The following HPI elements were documented for the patient's wound: Location: the right big toe has a draining sinus and has been draining purulent material Quality: Patient reports experiencing a dull pain to affected area(s). Severity: Patient states wound are getting worse. Duration: Patient has had the wound for > 2 months prior to seeking treatment at the wound center Timing: Pain in wound is Intermittent (comes and goes Context: The wound occurred when the patient had a blunt injury to the toe which continued to have draining fluid and has recently had x-rays Modifying Factors: Other treatment(s) tried include: her PCP saw her recently got an x-ray  done and put her on oral clindamycin Associated Signs and Symptoms: Patient reports having increase discharge. 55 year old patient known to have uncontrolled diabetes mellitus, who was recently seen by her PCP Dr. Tula Nakayama with a two-week history of foul-smelling purulent drainage from the right great toe which was injured bluntly about 2 months ago. The patient has had clindamycin given and that reduced some of the drainage and pus. recent hemoglobin A1c was 13 She is not a smoker. x-ray of the right foot done on 03/18/2015 -- IMPRESSION: Osteomyelitis of the tuft of the distal phalanx of the great toe with surrounding cellulitis. Wound History Patient presents with 1 open wound that has been present for approximately 2 months. Patient has been treating wound in the following manner: rubbing, massaging. Laboratory tests have not been performed in the last month. Patient reportedly has not tested positive for an antibiotic resistant organism.  Patient reportedly has tested positive for osteomyelitis. Patient reportedly has not had testing performed to evaluate circulation in the legs. Patient experiences the following problems associated with their wounds: infection, swelling. Patient History Information obtained from Patient. JERMAINE, HAFEN ([id number]) Allergies sulfur (Severity: Severe, Reaction: hives), narcotics (Severity: Severe, Reaction: hives, vomiting) Family History Diabetes - Mother, Father, Siblings, Heart Disease - Father, Hypertension - Mother, Father, Paternal Grandparents, Maternal Grandparents, Kidney Disease - Mother, Father, Stroke - Mother, Thyroid Problems - Mother, No family history of Cancer, Hereditary Spherocytosis, Lung Disease, Seizures, Tuberculosis. Social History Never smoker, Marital Status - Single, Alcohol Use - Never, Drug Use - No History, Caffeine Use - Rarely. Medical History Cardiovascular Patient has history of Hypertension Endocrine Patient has history of Type II Diabetes Neurologic Patient has history of Neuropathy Patient is treated with Insulin. Blood sugar is tested. 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 Eyes The patient has no complaints or symptoms. Ear/Nose/Mouth/Throat The patient has no complaints or symptoms. Hematologic/Lymphatic The patient has no complaints or symptoms. Respiratory The patient has no complaints or symptoms. Gastrointestinal The patient has no complaints or symptoms. Genitourinary The patient has no complaints or symptoms. Immunological Complains or has symptoms of Hives, Itching. Integumentary (Skin) Complains or has symptoms of Wounds. COLE, WILLIAM AMarland Kitchen ([id number]) Musculoskeletal The patient  has no complaints or symptoms. Oncologic The patient has no complaints or symptoms. Psychiatric The patient has no complaints or symptoms. Medications Lantus 100 unit/mL subcutaneous solution subcutaneous solution subcutaneous clindamycin 300 mg capsule oral 1 1 capsule oral Objective Constitutional Pulse regular. Respirations normal and unlabored. Afebrile. Vitals Time Taken: 9:33 AM, Height: 63 in, Source: Stated, Weight: 168 lbs, Source: Stated, BMI: 29.8, Temperature: 98.4 F, Pulse: 99 bpm, Respiratory Rate: 20 breaths/min, Blood Pressure: 145/82 mmHg. Eyes Nonicteric. Reactive to light. Ears, Nose, Mouth, and Throat Lips, teeth, and gums WNL.Marland Kitchen Moist mucosa without lesions. Neck supple and nontender. No palpable supraclavicular or cervical adenopathy. Normal sized without goiter. Respiratory WNL. No retractions.. Cardiovascular Pedal Pulses WNL. ABI on the right is 1.07. No clubbing, cyanosis or edema. Gastrointestinal (GI) Abdomen without masses or tenderness.. No liver or spleen enlargement or tenderness.. Lymphatic No adneopathy. No adenopathy. No adenopathy. Musculoskeletal Carone, Bunny A. ([id number]) Adexa without tenderness or enlargement.. Digits and nails w/o clubbing, cyanosis, infection, petechiae, ischemia, or inflammatory conditions.Marland Kitchen Psychiatric Judgement and insight Intact.. No evidence of depression, anxiety, or agitation.. General Notes: the right big toe  is swollen and has a previous deformity due to an injury several years ago. She also has a large ulcerated sinus tract at the tip of the toe which probes down deeply to about 2 cm and is down to bone. There is serous sanguinous fluid draining out of this. Integumentary (Hair, Skin) No suspicious lesions. No crepitus or fluctuance. No peri-wound warmth or erythema. No masses.. Wound #1 status is Open. Original cause of wound was Trauma. The wound is located on the Right Toe Great. The wound measures  0.6cm length x 1.4cm width x 1.6cm depth; 0.66cm^2 area and 1.056cm^3 volume. The wound is limited to skin breakdown. There is no tunneling or undermining noted. There is a medium amount of purulent drainage noted. The wound margin is thickened. There is no granulation within the wound bed. There is a large (67-100%) amount of necrotic tissue within the wound bed including Eschar and Adherent Slough. The periwound skin appearance exhibited: Localized Edema, Dry/Scaly, Moist. 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 The distal tuft of the toe is also positive for osteomyelitis. She has recently been put on antibiotics by her PCP and we will continue with these and no cultures have been taken today. I have recommended: 1. An appointment with Dr. Celesta Gentile the podiatrist, for operative debridement, bone cultures and pathology. 2. Infectious disease appointment depending on the above reports. 3. At the present time local care with quarter-inch iodoform packing on a daily basis. 4. depending on the surgical debridement and reports she will benefit from hyperbaric oxygen therapy for the [facility] grade 3 diabetic foot ulcer. Agrusa, Maryelizabeth A. ([id number]) 5. Details of hyperbaric oxygen therapy with the possible risks benefits and alternatives and all the possible complications have been discussed with her and in principle she is agreeable to proceed. 6. She will see me back next week in the office here. Plan Wound Cleansing: Wound #1 Right Toe Great: Clean wound with Normal Saline. Anesthetic: Wound #1 Right Toe Great: Topical Lidocaine 4% cream applied to wound bed prior  to debridement Primary Wound Dressing: Wound #1 Right Toe Great: Iodoform packing Gauze - 1/4 inch Secondary Dressing: Wound #1 Right Toe Great: Gauze and Kerlix/Conform Dressing Change Frequency: Wound #1 Right Toe Great: Change dressing every day. Follow-up Appointments: Wound #1 Right Toe Great: Return Appointment in 1 week. Edema Control: Wound #1 Right Toe Great: Patient to wear own Juxtalite/Juzo compression garment. General Notes: Ref- to Dr. Loretha Stapler podiatry as soon as can be seen. 55 year old patient with uncontrolled diabetes now has a Wagner grade 3 diabetic foot ulcer on the right big toe. The distal tuft of the toe is also positive for osteomyelitis. She has recently been put on antibiotics by her PCP and we will continue with these and no cultures have been taken today. I have recommended: 1. An appointment with Dr. Celesta Gentile the podiatrist, for operative debridement, bone cultures and pathology. 2. Infectious disease appointment depending on the above reports. 3. At the present time local care with quarter-inch iodoform packing on a daily basis. 4. depending on the surgical debridement and reports she will benefit from hyperbaric oxygen therapy for the [facility] grade 3 diabetic foot ulcer. 5. Details  of hyperbaric oxygen therapy with the possible risks benefits and alternatives and all the possible complications have been discussed with her and in principle she is agreeable to proceed. 6. She will see me back next week in the office here. SERITA, SILVER ([id number]) Electronic Signature(s) Signed: 03/22/2015 10:48:21 AM By: Christin Fudge MD, FACS Entered By: Christin Fudge on 03/22/2015 10:48:21 Caitlin Thompson ([id number]) -------------------------------------------------------------------------------- ROS/PFSH Details Patient Name: Caitlin Thompson Date of Service: 03/22/2015 8:45 AM Medical Record Number: [id number] Patient Account Number:  [id number] Date of Birth/Sex: [birth year] (54 y.o. Female) Treating RN: Carolyne Fiscal, Debi Primary Care Physician: Tula Nakayama Other Clinician: Referring Physician: Tula Nakayama Treating Physician/Extender: Frann Rider in Treatment: 0 Information Obtained From Patient Wound History Do you currently have one or more open woundso Yes How many open wounds do you currently haveo 1 Approximately how long have you had your woundso 2 months How have you been treating your wound(s) until nowo rubbing, massaging Has your wound(s) ever healed and then re-openedo No Have you had any lab work done in the past montho No Have you tested positive for an antibiotic resistant organism (MRSA, VRE)o No Have you tested positive for osteomyelitis (bone infection)o Yes Date: 09/20/2007 Have you had any tests for circulation on your legso No Have you had other problems associated with your woundso Infection, Swelling Constitutional Symptoms (General Health) Complaints and Symptoms: Positive for: Fatigue; Fever; Chills Immunological Complaints and Symptoms: Positive for: Hives; Itching Integumentary (Skin) Complaints and Symptoms: Positive for: Wounds Medical History: Past Medical History Notes: autoimmune disease with skin Eyes Complaints and Symptoms: No Complaints or Symptoms Ear/Nose/Mouth/Throat Swords, Reka A. ([id number]) Complaints and Symptoms: No Complaints or Symptoms Hematologic/Lymphatic Complaints and Symptoms: No Complaints or Symptoms Respiratory Complaints and Symptoms: No Complaints or Symptoms Cardiovascular Medical History: Positive for: Hypertension Past Medical History Notes: hyperlipidemia Gastrointestinal Complaints and Symptoms: No Complaints or Symptoms Endocrine Medical History: Positive for: Type II Diabetes Past Medical History Notes: autoimmune disease with the skin Time with diabetes: 10 years Treated with: Insulin Blood sugar  tested every day: Yes Tested : 3 x a day Genitourinary Complaints and Symptoms: No Complaints or Symptoms Musculoskeletal Complaints and Symptoms: No Complaints or Symptoms Neurologic Medical History: Positive for: Neuropathy Oncologic Macomber, Avelina A. ([id number]) Complaints and Symptoms: No Complaints or Symptoms Psychiatric Complaints and Symptoms: No Complaints or Symptoms Family and Social History Cancer: No; Diabetes: Yes - Mother, Father, Siblings; Heart Disease: Yes - Father; Hereditary Spherocytosis: No; Hypertension: Yes - Mother, Father, Paternal Grandparents, Maternal Grandparents; Kidney Disease: Yes - Mother, Father; Lung Disease: No; Seizures: No; Stroke: Yes - Mother; Thyroid Problems: Yes - Mother; Tuberculosis: No; Never smoker; Marital Status - Single; Alcohol Use: Never; Drug Use: No History; Caffeine Use: Rarely; Financial Concerns: No; Food, Clothing or Shelter Needs: No; Support System Lacking: No; Transportation Concerns: No; Advanced Directives: No; Patient does not want information on Advanced Directives; Living Will: No; Medical Power of Attorney: No Physician Affirmation I have reviewed and agree with the above information. Electronic Signature(s) Signed: 03/22/2015 9:32:11 AM By: Christin Fudge MD, FACS Signed: 03/22/2015 5:14:45 PM By: Alric Quan Entered By: Christin Fudge on 03/22/2015 09:32:11 Caitlin Thompson ([id number]) -------------------------------------------------------------------------------- SuperBill Details Patient Name: Caitlin Thompson Date of Service: 03/22/2015 Medical Record Number: [id number] Patient Account Number: [id number] Date of Birth/Sex: 1960/04/10 (54 y.o. Female) Treating RN: Carolyne Fiscal, Debi Primary Care Physician: Tula Nakayama Other Clinician: Referring Physician: Tula Nakayama Treating Physician/Extender: Frann Rider in Treatment: 0 Diagnosis Coding ICD-10  Codes Code  Description E11.621 Type 2 diabetes mellitus with foot ulcer L97.514 Non-pressure chronic ulcer of other part of right foot with necrosis of bone M86.471 Chronic osteomyelitis with draining sinus, right ankle and foot Facility Procedures CPT4 Code: [id number] Description: 99214 - WOUND CARE VISIT-LEV 4 EST PT Modifier: Quantity: 1 Physician Procedures CPT4 Code Description: [id number] - WC PHYS LEVEL 4 - NEW PT ICD-10 Description Diagnosis E11.621 Type 2 diabetes mellitus with foot ulcer L97.514 Non-pressure chronic ulcer of other part of right f M86.471 Chronic osteomyelitis with draining sinus,  right an Modifier: oot with necros kle and foot Quantity: 1 is of bone Electronic Signature(s) Signed: 03/22/2015 5:14:45 PM By: Alric Quan Previous Signature: 03/22/2015 10:48:36 AM Version By: Christin Fudge MD, FACS Entered By: Alric Quan on 03/22/2015 16:56:26

## 2015-03-23 ENCOUNTER — Ambulatory Visit (INDEPENDENT_AMBULATORY_CARE_PROVIDER_SITE_OTHER): Payer: Commercial Managed Care - HMO | Admitting: Podiatry

## 2015-03-23 ENCOUNTER — Telehealth: Payer: Self-pay | Admitting: Family Medicine

## 2015-03-23 ENCOUNTER — Other Ambulatory Visit: Payer: Self-pay

## 2015-03-23 ENCOUNTER — Ambulatory Visit (INDEPENDENT_AMBULATORY_CARE_PROVIDER_SITE_OTHER): Payer: Commercial Managed Care - HMO

## 2015-03-23 ENCOUNTER — Encounter: Payer: Self-pay | Admitting: Podiatry

## 2015-03-23 VITALS — BP 124/91 | HR 111 | Temp 98.0°F | Resp 16 | Ht 63.5 in | Wt 165.0 lb

## 2015-03-23 DIAGNOSIS — E119 Type 2 diabetes mellitus without complications: Secondary | ICD-10-CM

## 2015-03-23 DIAGNOSIS — Z0189 Encounter for other specified special examinations: Secondary | ICD-10-CM

## 2015-03-23 DIAGNOSIS — M86171 Other acute osteomyelitis, right ankle and foot: Secondary | ICD-10-CM | POA: Diagnosis not present

## 2015-03-23 DIAGNOSIS — L97514 Non-pressure chronic ulcer of other part of right foot with necrosis of bone: Secondary | ICD-10-CM

## 2015-03-23 MED ORDER — METFORMIN HCL 500 MG PO TABS: 500.0000 mg | ORAL_TABLET | Freq: Two times a day (BID) | ORAL | Status: DC

## 2015-03-23 NOTE — Progress Notes (Signed)
LORANN, ZENDER ([id number]) Visit Report for 03/22/2015 Abuse/Suicide Risk Screen Details Patient Name: Caitlin Thompson, Caitlin Thompson Date of Service: 03/22/2015 8:45 AM Medical Record Number: [id number] Patient Account Number: [id number] Date of Birth/Sex: 15-Jul-1960 (55 y.o. Female) Treating RN: Carolyne Fiscal, Debi Primary Care Physician: Tula Nakayama Other Clinician: Referring Physician: Tula Nakayama Treating Physician/Extender: Frann Rider in Treatment: 0 Abuse/Suicide Risk Screen Items Answer ABUSE/SUICIDE RISK SCREEN: Has anyone close to you tried to hurt or harm you recentlyo No Do you feel uncomfortable with anyone in your familyo No Has anyone forced you do things that you didnot want to doo No Do you have any thoughts of harming yourselfo No Patient displays signs or symptoms of abuse and/or neglect. No Electronic Signature(s) Signed: 03/22/2015 5:14:45 PM By: Alric Quan Entered By: Alric Quan on 03/22/2015 09:26:59 Caitlin Thompson ([id number]) -------------------------------------------------------------------------------- Activities of Daily Living Details Patient Name: Caitlin Thompson Date of Service: 03/22/2015 8:45 AM Medical Record Number: [id number] Patient Account Number: [id number] Date of Birth/Sex: Dec 12, 1960 (54 y.o. Female) Treating RN: Carolyne Fiscal, Debi Primary Care Physician: Tula Nakayama Other Clinician: Referring Physician: Tula Nakayama Treating Physician/Extender: Frann Rider in Treatment: 0 Activities of Daily Living Items Answer Activities of Daily Living (Please select one for each item) Drive Automobile Completely Able Take Medications Completely Able Use Telephone Completely Able Care for Appearance Completely Able Use Toilet Completely Able Bath / Shower Completely Able Dress Self Completely Able Feed Self Completely Able Walk Completely Able Get In / Out Bed Completely Able Housework Completely  Able Prepare Meals Completely [place] for Self Completely Able Electronic Signature(s) Signed: 03/22/2015 5:14:45 PM By: Alric Quan Entered By: Alric Quan on 03/22/2015 09:27:23 Caitlin Thompson ([id number]) -------------------------------------------------------------------------------- Education Assessment Details Patient Name: Caitlin Thompson Date of Service: 03/22/2015 8:45 AM Medical Record Number: [id number] Patient Account Number: [id number] Date of Birth/Sex: 08/06/1960 (54 y.o. Female) Treating RN: Carolyne Fiscal, Debi Primary Care Physician: Tula Nakayama Other Clinician: Referring Physician: Tula Nakayama Treating Physician/Extender: Frann Rider in Treatment: 0 Primary Learner Assessed: Patient Learning Preferences/Education Level/Primary Language Learning Preference: Explanation, Printed Material Highest Education Level: College or Above Preferred Language: English Cognitive Barrier Assessment/Beliefs Language Barrier: No Translator Needed: No Memory Deficit: No Emotional Barrier: No Cultural/Religious Beliefs Affecting Medical No Care: Physical Barrier Assessment Impaired Vision: No Impaired Hearing: No Decreased Hand dexterity: No Knowledge/Comprehension Assessment Knowledge Level: High Comprehension Level: High Ability to understand written High instructions: Ability to understand verbal High instructions: Motivation Assessment Anxiety Level: Calm Cooperation: Cooperative Education Importance: Acknowledges Need Interest in Health Problems: Asks Questions Perception: Coherent Willingness to Engage in Self- High Management Activities: Readiness to Engage in Self- High Management Activities: Electronic Signature(s) AUBRIEANNA, STREETT ([id number]) Signed: 03/22/2015 5:14:45 PM By: Alric Quan Entered By: Alric Quan on 03/22/2015 09:27:58 Caitlin Thompson  ([id number]) -------------------------------------------------------------------------------- Fall Risk Assessment Details Patient Name: Caitlin Thompson Date of Service: 03/22/2015 8:45 AM Medical Record Number: [id number] Patient Account Number: [id number] Date of Birth/Sex: [birth year] (54 y.o. Female) Treating RN: Carolyne Fiscal, Debi Primary Care Physician: Tula Nakayama Other Clinician: Referring Physician: Tula Nakayama Treating Physician/Extender: Frann Rider in Treatment: 0 Fall Risk Assessment Items Have you had 2 or more falls in the last 12 monthso 0 No Have you had any fall that resulted in injury in the last 12 monthso 0 No FALL RISK ASSESSMENT: History of falling - immediate or within 3 months 0 No Secondary diagnosis 0 No Ambulatory aid None/bed rest/wheelchair/nurse 0 No Crutches/cane/walker 0 No  Furniture 0 No IV Access/Saline Lock 0 No Gait/Training Normal/bed rest/immobile 0 No Weak 0 No Impaired 0 No Mental Status Oriented to own ability 0 No Electronic Signature(s) Signed: 03/22/2015 5:14:45 PM By: Alric Quan Entered By: Alric Quan on 03/22/2015 09:28:35 Caitlin Thompson ([id number]) -------------------------------------------------------------------------------- Foot Assessment Details Patient Name: Caitlin Thompson Date of Service: 03/22/2015 8:45 AM Medical Record Number: [id number] Patient Account Number: [id number] Date of Birth/Sex: 07/14/1960 (54 y.o. Female) Treating RN: Carolyne Fiscal, Debi Primary Care Physician: Tula Nakayama Other Clinician: Referring Physician: Tula Nakayama Treating Physician/Extender: Frann Rider in Treatment: 0 Foot Assessment Items Site Locations + = Sensation present, - = Sensation absent, C = Callus, U = Ulcer R = Redness, W = Warmth, M = Maceration, PU = Pre-ulcerative lesion F = Fissure, S = Swelling, D = Dryness Assessment Right: Left: Other Deformity: No No Prior  Foot Ulcer: No No Prior Amputation: No No Charcot Joint: No No Ambulatory Status: Ambulatory Without Help Gait: Steady Electronic Signature(s) Signed: 03/22/2015 5:14:45 PM By: Alric Quan Entered By: Alric Quan on 03/22/2015 09:31:13 Caitlin Thompson ([id number]) -------------------------------------------------------------------------------- Nutrition Risk Assessment Details Patient Name: Caitlin Thompson Date of Service: 03/22/2015 8:45 AM Medical Record Number: [id number] Patient Account Number: [id number] Date of Birth/Sex: 08/23/1960 (54 y.o. Female) Treating RN: Carolyne Fiscal, Debi Primary Care Physician: Tula Nakayama Other Clinician: Referring Physician: Tula Nakayama Treating Physician/Extender: Frann Rider in Treatment: 0 Height (in): Weight (lbs): Body Mass Index (BMI): Nutrition Risk Assessment Items NUTRITION RISK SCREEN: I have an illness or condition that made me change the kind and/or 2 Yes amount of food I eat I eat fewer than two meals per day 3 Yes I eat few fruits and vegetables, or milk products 0 No I have three or more drinks of beer, liquor or wine almost every day 0 No I have tooth or mouth problems that make it hard for me to eat 0 No I don't always have enough money to buy the food I need 0 No I eat alone most of the time 0 No I take three or more different prescribed or over-the-counter drugs a 1 Yes day Without wanting to, I have lost or gained 10 pounds in the last six 2 Yes months I am not always physically able to shop, cook and/or feed myself 0 No Nutrition Protocols Good Risk Protocol Moderate Risk Protocol Electronic Signature(s) Signed: 03/22/2015 5:14:45 PM By: Alric Quan Entered By: Alric Quan on 03/22/2015 09:29:13

## 2015-03-23 NOTE — Telephone Encounter (Signed)
Patient states that she will take the Metformin with Insulin to help with Blood Sugars, And the Wound Care Dr. Geraldine Solar her to stay on the antibiotic that Dr. Moshe Cipro Prescribed and to continue it right now, please advise

## 2015-03-23 NOTE — Patient Instructions (Signed)

## 2015-03-23 NOTE — Telephone Encounter (Signed)
pls send fopr note from wound center Send metformin 500 mg one twice daily Ask her what FBG values are and how much lantuus she is using Yes I am sure she will need to continue the antibiotic, so agee with woun Doc

## 2015-03-23 NOTE — Progress Notes (Signed)
IFRAH, GOLLY ([id number]) Visit Report for 03/22/2015 Allergy List Details Patient Name: Caitlin Thompson, Caitlin Thompson Date of Service: 03/22/2015 8:45 AM Medical Record Number: [id number] Patient Account Number: [id number] Date of Birth/Sex: [birth year] (55 y.o. Female) Treating RN: Carolyne Fiscal, Debi Primary Care Physician: Tula Nakayama Other Clinician: Referring Physician: Tula Nakayama Treating Physician/Extender: Frann Rider in Treatment: 0 Allergies Active Allergies sulfur Reaction: hives Severity: Severe narcotics Reaction: hives, vomiting Severity: Severe Allergy Notes Electronic Signature(s) Signed: 03/22/2015 5:14:45 PM By: Alric Quan Entered By: Alric Quan on 03/22/2015 09:12:53 Caitlin Thompson ([id number]) -------------------------------------------------------------------------------- Arrival Information Details Patient Name: Caitlin Thompson Date of Service: 03/22/2015 8:45 AM Medical Record Number: [id number] Patient Account Number: [id number] Date of Birth/Sex: Sep 27, 1960 (54 y.o. Female) Treating RN: Carolyne Fiscal, Debi Primary Care Physician: Tula Nakayama Other Clinician: Referring Physician: Tula Nakayama Treating Physician/Extender: Frann Rider in Treatment: 0 Visit Information Patient Arrived: Cane Arrival Time: 09:09 Accompanied By: Marygrace Drought Transfer Assistance: None Patient Identification Verified: Yes Secondary Verification Process Yes Completed: Patient Requires Transmission-Based No Precautions: Patient Has Alerts: Yes Patient Alerts: DM II Electronic Signature(s) Signed: 03/22/2015 5:14:45 PM By: Alric Quan Entered By: Alric Quan on 03/22/2015 09:10:06 Caitlin Thompson ([id number]) -------------------------------------------------------------------------------- Clinic Level of Care Assessment Details Patient Name: Caitlin Thompson Date of Service: 03/22/2015 8:45 AM Medical Record  Number: [id number] Patient Account Number: [id number] Date of Birth/Sex: 1961/02/17 (54 y.o. Female) Treating RN: Carolyne Fiscal, Debi Primary Care Physician: Tula Nakayama Other Clinician: Referring Physician: Tula Nakayama Treating Physician/Extender: Frann Rider in Treatment: 0 Clinic Level of Care Assessment Items TOOL 2 Quantity Score X - Use when only an EandM is performed on the INITIAL visit 1 0 ASSESSMENTS - Nursing Assessment / Reassessment []  - General Physical Exam (combine w/ comprehensive assessment (listed just 0 below) when performed on new pt. evals) X - Comprehensive Assessment (HX, ROS, Risk Assessments, Wounds Hx, etc.) 1 25 ASSESSMENTS - Wound and Skin Assessment / Reassessment X - Simple Wound Assessment / Reassessment - one wound 1 5 []  - Complex Wound Assessment / Reassessment - multiple wounds 0 []  - Dermatologic / Skin Assessment (not related to wound area) 0 ASSESSMENTS - Ostomy and/or Continence Assessment and Care []  - Incontinence Assessment and Management 0 []  - Ostomy Care Assessment and Management (repouching, etc.) 0 PROCESS - Coordination of Care []  - Simple Patient / Family Education for ongoing care 0 X - Complex (extensive) Patient / Family Education for ongoing care 1 20 X - Staff obtains Programmer, systems, Records, Test Results / Process Orders 1 10 []  - Staff telephones HHA, Nursing Homes / Clarify orders / etc 0 []  - Routine Transfer to another Facility (non-emergent condition) 0 []  - Routine Hospital Admission (non-emergent condition) 0 X - New Admissions / Biomedical engineer / Ordering NPWT, Apligraf, etc. 1 15 []  - Emergency Hospital Admission (emergent condition) 0 X - Simple Discharge Coordination 1 10 Radovich, Yoshino A. ([id number]) []  - Complex (extensive) Discharge Coordination 0 PROCESS - Special Needs []  - Pediatric / Minor Patient Management 0 []  - Isolation Patient Management 0 []  - Hearing / Language / Visual special  needs 0 []  - Assessment of Community assistance (transportation, D/C planning, etc.) 0 []  - Additional assistance / Altered mentation 0 []  - Support Surface(s) Assessment (bed, cushion, seat, etc.) 0 INTERVENTIONS - Wound Cleansing / Measurement X - Wound Imaging (photographs - any number of wounds) 1 5 []  - Wound Tracing (instead of photographs) 0 X - Simple Wound Measurement - one wound 1 5 []  -  Complex Wound Measurement - multiple wounds 0 X - Simple Wound Cleansing - one wound 1 5 []  - Complex Wound Cleansing - multiple wounds 0 INTERVENTIONS - Wound Dressings X - Small Wound Dressing one or multiple wounds 1 10 []  - Medium Wound Dressing one or multiple wounds 0 []  - Large Wound Dressing one or multiple wounds 0 []  - Application of Medications - injection 0 INTERVENTIONS - Miscellaneous []  - External ear exam 0 []  - Specimen Collection (cultures, biopsies, blood, body fluids, etc.) 0 []  - Specimen(s) / Culture(s) sent or taken to Lab for analysis 0 []  - Patient Transfer (multiple staff / Civil Service fast streamer / Similar devices) 0 []  - Simple Staple / Suture removal (25 or less) 0 []  - Complex Staple / Suture removal (26 or more) 0 Librizzi, Aria A. ([id number]) []  - Hypo / Hyperglycemic Management (close monitor of Blood Glucose) 0 X - Ankle / Brachial Index (ABI) - do not check if billed separately 1 15 Has the patient been seen at the hospital within the last three years: Yes Total Score: 125 Level Of Care: New/Established - Level 4 Electronic Signature(s) Signed: 03/22/2015 5:14:45 PM By: Alric Quan Entered By: Alric Quan on 03/22/2015 16:56:16 Caitlin Thompson ([id number]) -------------------------------------------------------------------------------- Encounter Discharge Information Details Patient Name: Caitlin Thompson Date of Service: 03/22/2015 8:45 AM Medical Record Number: [id number] Patient Account Number: [id number] Date of Birth/Sex: Jun 22, 1960 (54 y.o.  Female) Treating RN: Carolyne Fiscal, Debi Primary Care Physician: Tula Nakayama Other Clinician: Referring Physician: Tula Nakayama Treating Physician/Extender: Frann Rider in Treatment: 0 Encounter Discharge Information Items Discharge Pain Level: 0 Discharge Condition: Stable Ambulatory Status: Cane Discharge Destination: Home Transportation: Private Auto Accompanied By: daughter Schedule Follow-up Appointment: Yes Medication Reconciliation completed Yes and provided to Patient/Care Sissy Goetzke: Provided on Clinical Summary of Care: 03/22/2015 Form Type Recipient Paper Patient LP Electronic Signature(s) Signed: 03/22/2015 10:35:55 AM By: Ruthine Dose Entered By: Ruthine Dose on 03/22/2015 10:35:54 Caitlin Thompson ([id number]) -------------------------------------------------------------------------------- Lower Extremity Assessment Details Patient Name: Caitlin Thompson Date of Service: 03/22/2015 8:45 AM Medical Record Number: [id number] Patient Account Number: [id number] Date of Birth/Sex: 04-19-1960 (54 y.o. Female) Treating RN: Carolyne Fiscal, Debi Primary Care Physician: Tula Nakayama Other Clinician: Referring Physician: Tula Nakayama Treating Physician/Extender: Frann Rider in Treatment: 0 Edema Assessment Assessed: [Left: No] [Right: No] Edema: [Left: N] [Right: o] Calf Left: Right: Point of Measurement: 33 cm From Medial Instep cm 37 cm Ankle Left: Right: Point of Measurement: 10 cm From Medial Instep cm 21.7 cm Vascular Assessment Pulses: Posterior Tibial Dorsalis Pedis Palpable: [Right:Yes] Doppler: [Right:Monophasic] Extremity colors, hair growth, and conditions: Extremity Color: [Right:Hyperpigmented] Hair Growth on Extremity: [Right:Yes] Temperature of Extremity: [Right:Warm] Capillary Refill: [Right:< 3 seconds] Blood Pressure: Brachial: [Right:140] Dorsalis Pedis: [Left:Dorsalis Pedis: 150] Ankle: Posterior  Tibial: [Left:Posterior Tibial: 150] [Right:1.07] Electronic Signature(s) Signed: 03/22/2015 5:14:45 PM By: Alric Quan Entered By: Alric Quan on 03/22/2015 09:42:10 Rasnick, Roosevelt Locks ([id number]) -------------------------------------------------------------------------------- Multi Wound Chart Details Patient Name: Caitlin Thompson Date of Service: 03/22/2015 8:45 AM Medical Record Number: [id number] Patient Account Number: [id number] Date of Birth/Sex: 1961/02/04 (54 y.o. Female) Treating RN: Carolyne Fiscal, Debi Primary Care Physician: Tula Nakayama Other Clinician: Referring Physician: Tula Nakayama Treating Physician/Extender: Frann Rider in Treatment: 0 Vital Signs Height(in): 63 Pulse(bpm): 99 Weight(lbs): 168 Blood Pressure 145/82 (mmHg): Body Mass Index(BMI): 30 Temperature(F): 98.4 Respiratory Rate 20 (breaths/min): Photos: [1:No Photos] [N/A:N/A] Wound Location: [1:Right Toe Great] [N/A:N/A] Wounding Event: [1:Trauma] [N/A:N/A] Primary Etiology: [1:Trauma, Other] [N/A:N/A] Comorbid History: [1:Hypertension,  Type II Diabetes, Neuropathy] [N/A:N/A] Date Acquired: [1:01/20/2015] [N/A:N/A] Weeks of Treatment: [1:0] [N/A:N/A] Wound Status: [1:Open] [N/A:N/A] Measurements L x W x D 0.6x1.4x0.1 [N/A:N/A] (cm) Area (cm) : [1:0.66] [N/A:N/A] Volume (cm) : [1:0.066] [N/A:N/A] Classification: [1:Partial Thickness] [N/A:N/A] HBO Classification: [1:Grade 1] [N/A:N/A] Exudate Amount: [1:Medium] [N/A:N/A] Exudate Type: [1:Purulent] [N/A:N/A] Exudate Color: [1:yellow, brown, green] [N/A:N/A] Wound Margin: [1:Thickened] [N/A:N/A] Granulation Amount: [1:None Present (0%)] [N/A:N/A] Necrotic Amount: [1:Large (67-100%)] [N/A:N/A] Necrotic Tissue: [1:Eschar, Adherent Slough] [N/A:N/A] Exposed Structures: [1:Fascia: No Fat: No Tendon: No Muscle: No Joint: No Bone: No Limited to Skin Breakdown] [N/A:N/A] Epithelialization: None N/A N/A Periwound  Skin Texture: Edema: Yes N/A N/A Periwound Skin Moist: Yes N/A N/A Moisture: Dry/Scaly: Yes Periwound Skin Color: No Abnormalities Noted N/A N/A Tenderness on No N/A N/A Palpation: Wound Preparation: Ulcer Cleansing: N/A N/A Rinsed/Irrigated with Saline Topical Anesthetic Applied: Other: lidocaine 4% cream Treatment Notes Electronic Signature(s) Signed: 03/22/2015 5:14:45 PM By: Alric Quan Entered By: Alric Quan on 03/22/2015 09:45:30 Caitlin Thompson ([id number]) -------------------------------------------------------------------------------- Multi-Disciplinary Care Plan Details Patient Name: Caitlin Thompson Date of Service: 03/22/2015 8:45 AM Medical Record Number: [id number] Patient Account Number: [id number] Date of Birth/Sex: 09-11-1960 (54 y.o. Female) Treating RN: Carolyne Fiscal, Debi Primary Care Physician: Tula Nakayama Other Clinician: Referring Physician: Tula Nakayama Treating Physician/Extender: Frann Rider in Treatment: 0 Active Inactive Abuse / Safety / Falls / Self Care Management Nursing Diagnoses: Potential for falls Goals: Patient will remain injury free Date Initiated: 03/22/2015 Goal Status: Active Interventions: Assess fall risk on admission and as needed Notes: Nutrition Nursing Diagnoses: Imbalanced nutrition Goals: Patient/caregiver verbalizes understanding of need to maintain therapeutic glucose control per primary care physician Date Initiated: 03/22/2015 Goal Status: Active Patient/caregiver will maintain therapeutic glucose control Date Initiated: 03/22/2015 Goal Status: Active Interventions: Assess patient nutrition upon admission and as needed per policy Notes: Orientation to the Wound Care Program Nursing Diagnoses: Knowledge deficit related to the wound healing center program CHARMELL, ARAIZA A. ([id number]) Goals: Patient/caregiver will verbalize understanding of the [place]  Program Date Initiated: 03/22/2015 Goal Status: Active Interventions: Provide education on orientation to the wound center Notes: Pain, Acute or Chronic Nursing Diagnoses: Pain Management - Non-cyclic Chronic Pain Pain, acute or chronic: actual or potential Potential alteration in comfort, pain Goals: Patient will verbalize adequate pain control and receive pain control interventions during procedures as needed Date Initiated: 03/22/2015 Goal Status: Active Patient/caregiver will verbalize adequate pain control between visits Date Initiated: 03/22/2015 Goal Status: Active Interventions: Assess comfort goal upon admission Notes: Soft Tissue Infection Nursing Diagnoses: Impaired tissue integrity Goals: Signs and symptoms of infection will be recognized early to allow for prompt treatment Date Initiated: 03/22/2015 Goal Status: Active Interventions: Assess signs and symptoms of infection every visit Notes: Wound/Skin Impairment Dinapoli, Corneshia A. ([id number]) Nursing Diagnoses: Impaired tissue integrity Goals: Patient/caregiver will verbalize understanding of skin care regimen Date Initiated: 03/22/2015 Goal Status: Active Ulcer/skin breakdown will have a volume reduction of 30% by week 4 Date Initiated: 03/22/2015 Goal Status: Active Ulcer/skin breakdown will have a volume reduction of 50% by week 8 Date Initiated: 03/22/2015 Goal Status: Active Ulcer/skin breakdown will have a volume reduction of 80% by week 12 Date Initiated: 03/22/2015 Goal Status: Active Interventions: Assess patient/caregiver ability to obtain necessary supplies Assess patient/caregiver ability to perform ulcer/skin care regimen upon admission and as needed Notes: Electronic Signature(s) Signed: 03/22/2015 5:14:45 PM By: Alric Quan Entered By: Alric Quan on 03/22/2015 10:01:47 Caitlin Thompson  ([id number]) -------------------------------------------------------------------------------- Pain Assessment Details Patient Name: [id number],  Jodine A. Date of Service: 03/22/2015 8:45 AM Medical Record Number: [id number] Patient Account Number: [id number] Date of Birth/Sex: 05/11/1960 (54 y.o. Female) Treating RN: Carolyne Fiscal, Debi Primary Care Physician: Tula Nakayama Other Clinician: Referring Physician: Tula Nakayama Treating Physician/Extender: Frann Rider in Treatment: 0 Active Problems Location of Pain Severity and Description of Pain Patient Has Paino Yes Site Locations Pain Location: Pain in Ulcers Duration of the Pain. Constant / Intermittento Intermittent Rate the pain. Current Pain Level: 5 Worst Pain Level: 10 Least Pain Level: 0 Character of Pain Describe the Pain: Sharp, Tender, Throbbing Pain Management and Medication Current Pain Management: Electronic Signature(s) Signed: 03/22/2015 5:14:45 PM By: Alric Quan Entered By: Alric Quan on 03/22/2015 09:31:32 Caitlin Thompson ([id number]) -------------------------------------------------------------------------------- Patient/Caregiver Education Details Patient Name: Caitlin Thompson Date of Service: 03/22/2015 8:45 AM Medical Record Number: [id number] Patient Account Number: [id number] Date of Birth/Gender: 03-09-1960 (54 y.o. Female) Treating RN: Carolyne Fiscal, Debi Primary Care Physician: Tula Nakayama Other Clinician: Referring Physician: Tula Nakayama Treating Physician/Extender: Frann Rider in Treatment: 0 Education Assessment Education Provided To: Patient Education Topics Provided Wound/Skin Impairment: Handouts: Other: change dressing as ordered Electronic Signature(s) Signed: 03/22/2015 5:14:45 PM By: Alric Quan Entered By: Alric Quan on 03/22/2015 10:23:52 Caitlin Thompson  ([id number]) -------------------------------------------------------------------------------- Wound Assessment Details Patient Name: Caitlin Thompson Date of Service: 03/22/2015 8:45 AM Medical Record Number: [id number] Patient Account Number: [id number] Date of Birth/Sex: 03-07-1960 (54 y.o. Female) Treating RN: Carolyne Fiscal, Debi Primary Care Physician: Tula Nakayama Other Clinician: Referring Physician: Tula Nakayama Treating Physician/Extender: Frann Rider in Treatment: 0 Wound Status Wound Number: 1 Primary Trauma, Other Etiology: Wound Location: Right Toe Great Wound Status: Open Wounding Event: Trauma Comorbid Hypertension, Type II Diabetes, Date Acquired: 01/20/2015 History: Neuropathy Weeks Of Treatment: 0 Clustered Wound: No Photos Photo Uploaded By: Alric Quan on 03/22/2015 16:46:21 Wound Measurements Length: (cm) 0.6 Width: (cm) 1.4 Depth: (cm) 1.6 Area: (cm) 0.66 Volume: (cm) 1.056 % Reduction in Area: 0% % Reduction in Volume: 0% Epithelialization: None Tunneling: No Undermining: No Wound Description Classification: Partial Thickness Diabetic Severity Earleen Newport): Grade 1 Wound Margin: Thickened Exudate Amount: Medium Exudate Type: Purulent Exudate Color: yellow, brown, green Foul Odor After Cleansing: No Wound Bed Granulation Amount: None Present (0%) Exposed Structure Necrotic Amount: Large (67-100%) Fascia Exposed: No Necrotic Quality: Eschar, Adherent Slough Fat Layer Exposed: No Dicostanzo, Anett A. ([id number]) Tendon Exposed: No Muscle Exposed: No Joint Exposed: No Bone Exposed: No Limited to Skin Breakdown Periwound Skin Texture Texture Color No Abnormalities Noted: No No Abnormalities Noted: No Localized Edema: Yes Moisture No Abnormalities Noted: No Dry / Scaly: Yes Moist: Yes Wound Preparation Ulcer Cleansing: Rinsed/Irrigated with Saline Topical Anesthetic Applied: Other: lidocaine 4% cream, Treatment  Notes Wound #1 (Right Toe Great) 1. Cleansed with: Clean wound with Normal Saline 2. Anesthetic Topical Lidocaine 4% cream to wound bed prior to debridement 4. Dressing Applied: Iodoform packing Gauze 5. Secondary Dressing Applied Gauze and Kerlix/Conform Electronic Signature(s) Signed: 03/22/2015 5:14:45 PM By: Alric Quan Entered By: Alric Quan on 03/22/2015 10:24:58 Caitlin Thompson ([id number]) -------------------------------------------------------------------------------- Vitals Details Patient Name: Caitlin Thompson Date of Service: 03/22/2015 8:45 AM Medical Record Number: [id number] Patient Account Number: [id number] Date of Birth/Sex: [birth year] (54 y.o. Female) Treating RN: Carolyne Fiscal, Debi Primary Care Physician: Tula Nakayama Other Clinician: Referring Physician: Tula Nakayama Treating Physician/Extender: Frann Rider in Treatment: 0 Vital Signs Time Taken: 09:33 Temperature (F): 98.4 Height (in): 63 Pulse (bpm): 99 Source: Stated Respiratory Rate (breaths/min): 20 Weight (lbs): 168  Blood Pressure (mmHg): 145/82 Source: Stated Reference Range: 80 - 120 mg / dl Body Mass Index (BMI): 29.8 Electronic Signature(s) Signed: 03/22/2015 5:14:45 PM By: Alric Quan Entered By: Alric Quan on 03/22/2015 09:34:13

## 2015-03-23 NOTE — Telephone Encounter (Signed)
Do you agree with sending Metformin now?

## 2015-03-23 NOTE — Progress Notes (Signed)
Subjective:    Patient ID: Caitlin Thompson, female    DOB: [birth year], 55 y.o.   MRN: [id number]  HPI  55 year old female presents the office or concerns she'll wound to her right big toe which has been ongoing for approximate 2 months. She states that she scraped her toe approximate 2 months ago resulting in a wound. The wound to progress and she had previous posterolateral the wound. She states that she was told that she had a bone infection or big toe. She states that she does not want to have the toe amputated and wants to try everything possible to avoid having the toe amputated. She is diabetic and her last A1c was 13. She states that prior to the infection or blood sugar was under better control however over the last 2 months it has increased. Her blister this morning was 320. She does get numbness and tingling to her feet. She's never had a wound previously. No other complaints.   Review of Systems  All other systems reviewed and are negative.      Objective:   Physical Exam General: AAO x3, NAD  Dermatological: There is edema to the right hallux. The distal aspect is a granular wound measuring approximately 0.5 x 0.5 cm which probes directly to bone. There is no pus expressed have there is bloody drainage. There is no flexor crepitus. No erythema to the hallux or increase in warmth. No ascending cellulitis. There is no other open lesions or pre-ulcerative lesions identified bilaterally.  Vascular: Dorsalis Pedis artery and Posterior Tibial artery pedal pulses are 2/4 bilateral with immedate capillary fill time. Pedal hair growth present. No varicosities and no lower extremity edema present bilateral. There is no pain with calf compression, swelling, warmth, erythema.   Neruologic: Sensation appears to be intact with Derrel Nip monofilament as well as vibratory sensation although she does get subjective numbness and tingling to her toes. Reflexes intact.  Musculoskeletal: No  gross boney pedal deformities bilateral. No pain, crepitus, or limitation noted with foot and ankle range of motion bilateral. Muscular strength 5/5 in all groups tested bilateral.  Gait: Unassisted, Nonantalgic.      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 There does appear to be destruction of the distal pharynx the hallux consistent with osteomyelitis as well as clinical findings consistent with osteomyelitis. -I had a very long discussion with the patient in regards to treatment options for osteomyelitis. I discussed with her amputation of the toe given the wound and osteomyelitis. However she is adamant that she does not want to have the toe amputated at this time. Because of this I do believe that we should target more closely the bacteria to help guide antibiotics. I discussed that along the infection goes on she can run the risk of further amputation, sepsis and other issues. Also discussed with her organ issues from long-term antibiotics. She has elected to try limb salvage. -I discussed with her bone biopsy right hallux and wound debridement. -I will have her stop antibiotics for now to help get better culture data -The incision placement as well as the postoperative course was discussed with the patient. I discussed risks of the surgery which include, but not limited to, infection, bleeding, pain, swelling, need for further surgery, delayed or nonhealing, painful or ugly scar, numbness or sensation changes, over/under correction, recurrence, transfer lesions, further deformity, hardware failure, DVT/PE, loss of toe/foot and further amputation. Patient understands  these risks and wishes to proceed with surgery. The surgical consent was reviewed with the patient all 3 pages were signed. No promises or guarantees were given to the outcome of the procedure. All questions were answered to the best of my ability. Before  the surgery the patient was encouraged to call the office if there is any further questions. The surgery will be performed at the [facility] on an outpatient basis. Surgery will be next Wednesday.   Celesta Gentile, DPM

## 2015-03-24 ENCOUNTER — Telehealth: Payer: Self-pay | Admitting: *Deleted

## 2015-03-24 DIAGNOSIS — M86179 Other acute osteomyelitis, unspecified ankle and foot: Secondary | ICD-10-CM | POA: Insufficient documentation

## 2015-03-24 DIAGNOSIS — L97519 Non-pressure chronic ulcer of other part of right foot with unspecified severity: Secondary | ICD-10-CM | POA: Insufficient documentation

## 2015-03-24 NOTE — Telephone Encounter (Signed)
Authorization was obtained from [facility] for surgery scheduled for 03/31/2015 for cpts 97597 and 20245.  Authorization number is [id number].  It was faxed to [facility] at [facility].

## 2015-03-24 NOTE — Telephone Encounter (Signed)
Needs to increase lantusm to 15 units please let her knwo, even with the metformin, that will be good

## 2015-03-24 NOTE — Telephone Encounter (Signed)
Medication sent.  Patient aware.  States that fbg today is 236.  She saw podiatry and the wound center.  Will obtain report from wound center as they are not on epic.

## 2015-03-25 ENCOUNTER — Telehealth: Payer: Self-pay | Admitting: *Deleted

## 2015-03-25 ENCOUNTER — Ambulatory Visit (HOSPITAL_COMMUNITY)
Admission: RE | Admit: 2015-03-25 | Discharge: 2015-03-25 | Disposition: A | Discharge status: Home / Self Care | Payer: Commercial Managed Care - HMO | Source: Ambulatory Visit | Attending: Family Medicine | Admitting: Family Medicine

## 2015-03-25 DIAGNOSIS — Z1231 Encounter for screening mammogram for malignant neoplasm of breast: Secondary | ICD-10-CM

## 2015-03-25 NOTE — Telephone Encounter (Signed)
"  I'm calling to see if you know the arrival time of my surgery that is scheduled for Wednesday of next week.  I see an appointment for 01/31 at 2:45pm in [place].  That's not it is it?"  No, that is your follow-up appointment with [place].  Surgical center is not part of [place].  Surgical center normally calls a day or two prior to surgery date with the arrival time.  Reason I don't give the times is because they may have cancellations or have patients with medical issues that have to go first.  "It makes it hard to make arrangements.  My daughter is a Education officer, museum and has to make arrangements at work."  You can call the surgical center and see if they can give you a time.  Their number is [phone].  "Okay, thank you."

## 2015-03-29 ENCOUNTER — Ambulatory Visit: Payer: Commercial Managed Care - HMO | Admitting: Surgery

## 2015-03-31 ENCOUNTER — Encounter: Payer: Self-pay | Admitting: Podiatry

## 2015-03-31 DIAGNOSIS — L97514 Non-pressure chronic ulcer of other part of right foot with necrosis of bone: Secondary | ICD-10-CM | POA: Diagnosis not present

## 2015-03-31 DIAGNOSIS — M86179 Other acute osteomyelitis, unspecified ankle and foot: Secondary | ICD-10-CM | POA: Diagnosis not present

## 2015-04-01 ENCOUNTER — Ambulatory Visit
Admission: RE | Admit: 2015-04-01 | Discharge: 2015-04-01 | Disposition: A | Discharge status: Home / Self Care | Payer: Commercial Managed Care - HMO | Source: Ambulatory Visit | Attending: Surgery | Admitting: Surgery

## 2015-04-01 ENCOUNTER — Other Ambulatory Visit: Payer: Self-pay | Admitting: Surgery

## 2015-04-01 ENCOUNTER — Encounter: Payer: Commercial Managed Care - HMO | Admitting: Surgery

## 2015-04-01 DIAGNOSIS — Z9289 Personal history of other medical treatment: Secondary | ICD-10-CM

## 2015-04-01 DIAGNOSIS — Z01818 Encounter for other preprocedural examination: Secondary | ICD-10-CM | POA: Insufficient documentation

## 2015-04-01 DIAGNOSIS — E11621 Type 2 diabetes mellitus with foot ulcer: Secondary | ICD-10-CM | POA: Diagnosis not present

## 2015-04-02 NOTE — Progress Notes (Addendum)
ANSHIKA, RAFFENSPERGER ([id number]) Visit Report for 04/01/2015 Chief Complaint Document Details Patient Name: Caitlin Thompson, Caitlin Thompson 04/01/2015 12:45 Date of Service: PM Medical Record [id number] Number: Patient Account Number: [id number] 1960/11/04 (55 y.o. Treating RN: Montey Hora Date of Birth/Sex: Female) Other Clinician: Primary Care Physician: Moshe Cipro, MARGARET Treating Marisa Hage Referring Physician: Tula Nakayama Physician/Extender: Suella Grove in Treatment: 1 Information Obtained from: Patient Chief Complaint Patients presents for treatment of an open diabetic ulcer to her right big toe which she's had for about 2 months Electronic Signature(s) Signed: 04/01/2015 1:24:36 PM By: Christin Fudge MD, FACS Entered By: Christin Fudge on 04/01/2015 13:24:36 [name] ([id number]) -------------------------------------------------------------------------------- HPI Details Patient Name: Janeece Fitting A. 04/01/2015 12:45 Date of Service: PM Medical Record [id number] Number: Patient Account Number: [id number] 1960-10-16 (55 y.o. Treating RN: Montey Hora Date of Birth/Sex: Female) Other Clinician: Primary Care Physician: SIMPSON, MARGARET Treating Kerina Simoneau Referring Physician: Tula Nakayama Physician/Extender: Weeks in Treatment: 1 History of Present Illness Location: the right big toe has a draining sinus and has been draining purulent material Quality: Patient reports experiencing a dull pain to affected area(s). Severity: Patient states wound are getting worse. Duration: Patient has had the wound for > 2 months prior to seeking treatment at the wound center Timing: Pain in wound is Intermittent (comes and goes Context: The wound occurred when the patient had a blunt injury to the toe which continued to have draining fluid and has recently had x-rays Modifying Factors: Other treatment(s) tried include: her PCP saw her recently got an x-ray done and  put her on oral clindamycin Associated Signs and Symptoms: Patient reports having increase discharge. HPI Description: 55 year old patient known to have uncontrolled diabetes mellitus, who was recently seen by her PCP Dr. Tula Nakayama with a two-week history of foul-smelling purulent drainage from the right great toe which was injured bluntly about 2 months ago. The patient has had clindamycin given and that reduced some of the drainage and pus. recent hemoglobin A1c was 13 She is not a smoker. x-ray of the right foot done on 03/18/2015 -- IMPRESSION: Osteomyelitis of the tuft of the distal phalanx of the great toe with surrounding cellulitis. 04/01/2015 -- the patient has had a debridement done yesterday by Dr. Mayo Ao at [facility]. Operative reports and bone biopsies are still pending. She is to continue with her clindamycin until the pathology and culture reports are available. Electronic Signature(s) Signed: 04/01/2015 1:25:50 PM By: Christin Fudge MD, FACS Entered By: Christin Fudge on 04/01/2015 13:25:50 [name] ([id number]) -------------------------------------------------------------------------------- Physical Exam Details Patient Name: ZATANNA, BETHEL 04/01/2015 12:45 Date of Service: PM Medical Record [id number] Number: Patient Account Number: [id number] 1960/07/27 (55 y.o. Treating RN: Montey Hora Date of Birth/Sex: Female) Other Clinician: Primary Care Physician: SIMPSON, MARGARET Treating Jesly Hartmann Referring Physician: Tula Nakayama Physician/Extender: Weeks in Treatment: 1 Constitutional . Pulse regular. Respirations normal and unlabored. Afebrile. . Eyes Nonicteric. Reactive to light. Ears, Nose, Mouth, and Throat Lips, teeth, and gums WNL.Marland Kitchen Moist mucosa without lesions. Neck supple and nontender. No palpable supraclavicular or cervical adenopathy. Normal sized without goiter. Respiratory WNL. No  retractions.. Cardiovascular Pedal Pulses WNL. No clubbing, cyanosis or edema. Chest Breasts symmetical and no nipple discharge.. Breast tissue WNL, no masses, lumps, or tenderness.. Lymphatic No adneopathy. No adenopathy. No adenopathy. Musculoskeletal Adexa without tenderness or enlargement.. Digits and nails w/o clubbing, cyanosis, infection, petechiae, ischemia, or inflammatory conditions.. Integumentary (Hair, Skin) No suspicious lesions. No crepitus or fluctuance. No peri-wound warmth or erythema. No masses.Marland Kitchen  Psychiatric Judgement and insight Intact.. No evidence of depression, anxiety, or agitation.. Notes the tip of the right big toe has a open wound which was debrided yesterday and has been left open with a gauze packing. the wound is clean no active bleeding and no purulent discharge. I understand she is going to be followed up next week by her podiatrist. Electronic Signature(s) Signed: 04/01/2015 1:27:27 PM By: Christin Fudge MD, FACS Entered By: Christin Fudge on 04/01/2015 13:27:26 [name] ([id number]) -------------------------------------------------------------------------------- Physician Orders Details Patient Name: Janeece Fitting A. 04/01/2015 12:45 Date of Service: PM Medical Record [id number] Number: Patient Account Number: [id number] October 31, 1960 (55 y.o. Treating RN: Montey Hora Date of Birth/Sex: Female) Other Clinician: Primary Care Physician: SIMPSON, MARGARET Treating Nelson Noone Referring Physician: Tula Nakayama Physician/Extender: Suella Grove in Treatment: 1 Verbal / Phone Orders: Yes Clinician: Montey Hora Read Back and Verified: Yes Diagnosis Coding Wound Cleansing Wound #1 Right Toe Great o Clean wound with Normal Saline. Anesthetic Wound #1 Right Toe Great o Topical Lidocaine 4% cream applied to wound bed prior to debridement Primary Wound Dressing Wound #1 Right Toe Great o Iodoform packing Gauze - 1/4  inch Secondary Dressing Wound #1 Right Toe Great o Gauze and Kerlix/Conform Dressing Change Frequency Wound #1 Right Toe Great o Change dressing every day. Follow-up Appointments Wound #1 Right Toe Great o Return Appointment in 1 week. Edema Control Wound #1 Right Toe Great o Patient to wear own Juxtalite/Juzo compression garment. Medications-please add to medication list. Wound #1 Right Toe Great o P.O. Antibiotics - continue clindamycin Hagerman, Kaylise A. ([id number]) Radiology o X-ray, Chest [employer] o EKG Electronic Signature(s) Signed: 04/01/2015 4:27:41 PM By: Christin Fudge MD, FACS Signed: 04/01/2015 5:48:41 PM By: Montey Hora Entered By: Montey Hora on 04/01/2015 13:17:14 [name] ([id number]) -------------------------------------------------------------------------------- Problem List Details Patient Name: JAEL, KESTER 04/01/2015 12:45 Date of Service: PM Medical Record [id number] Number: Patient Account Number: [id number] 30-Jan-1961 (55 y.o. 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  04/01/2015 12:45 Date of Service: PM Medical Record [id number] Number: Patient Account Number: [id number] [birth year] (55 y.o. Treating RN: Montey Hora Date of Birth/Sex: Female) Other Clinician: Primary Care Physician: SIMPSON, MARGARET Treating Darina Hartwell Referring Physician: Tula Nakayama Physician/Extender: Suella Grove in Treatment: 1 Subjective Chief Complaint Information obtained from Patient Patients presents for treatment of an open diabetic ulcer to her right big toe which she's had for about 2 months History of Present Illness (HPI) The following HPI elements were documented for the patient's wound: Location: the right big toe has a draining sinus and has been draining purulent material Quality: Patient reports experiencing a dull pain to affected area(s). Severity: Patient states wound are getting worse. Duration: Patient has had the wound for > 2 months prior to seeking treatment at the wound center Timing: Pain in wound is Intermittent (comes and goes Context: The wound occurred when the patient had a blunt  injury to the toe which continued to have draining fluid and has recently had x-rays Modifying Factors: Other treatment(s) tried include: her PCP saw her recently got an x-ray done and put her on oral clindamycin Associated Signs and Symptoms: Patient reports having increase discharge. 55 year old patient known to have uncontrolled diabetes mellitus, who was recently seen by her PCP Dr. Tula Nakayama with a two-week history of foul-smelling purulent drainage from the right great toe which was injured bluntly about 2 months ago. The patient has had clindamycin given and that reduced some of the drainage and pus. recent hemoglobin A1c was 13 She is not a smoker. x-ray of the right foot done on 03/18/2015 -- IMPRESSION: Osteomyelitis of the tuft of the distal phalanx of the great toe with surrounding cellulitis. 04/01/2015 -- the patient has had a debridement  done yesterday by Dr. Mayo Ao at [facility]. Operative reports and bone biopsies are still pending. She is to continue with her clindamycin until the pathology and culture reports are available. Agudelo, Adryanna A. ([id number]) Objective Constitutional Pulse regular. Respirations normal and unlabored. Afebrile. Vitals Time Taken: 12:55 PM, Height: 63 in, Weight: 168 lbs, BMI: 29.8, Temperature: 98.4 F, Pulse: 91 bpm, Respiratory Rate: 18 breaths/min, Blood Pressure: 147/78 mmHg. Eyes Nonicteric. Reactive to light. Ears, Nose, Mouth, and Throat Lips, teeth, and gums WNL.Marland Kitchen Moist mucosa without lesions. Neck supple and nontender. No palpable supraclavicular or cervical adenopathy. Normal sized without goiter. Respiratory WNL. No retractions.. Cardiovascular Pedal Pulses WNL. No clubbing, cyanosis or edema. Chest Breasts symmetical and no nipple discharge.. Breast tissue WNL, no masses, lumps, or tenderness.. Lymphatic No adneopathy. No adenopathy. No adenopathy. Musculoskeletal Adexa without tenderness or enlargement.. Digits and nails w/o clubbing, cyanosis, infection, petechiae, ischemia, or inflammatory conditions.Marland Kitchen Psychiatric Judgement and insight Intact.. No evidence of depression, anxiety, or agitation.. General Notes: the tip of the right big toe has a open wound which was debrided yesterday and has been left open with a gauze packing. the wound is clean no active bleeding and no purulent discharge. I understand she is going to be followed up next week by her podiatrist. Integumentary (Hair, Skin) No suspicious lesions. No crepitus or fluctuance. No peri-wound warmth or erythema. No masses.. Wound #1 status is Open. Original cause of wound was Trauma. The wound is located on the Right Toe Great. The wound measures 1cm length x 0.9cm width x 1.5cm depth; 0.707cm^2 area and 1.06cm^3 volume. The wound is limited to skin breakdown. There is no tunneling or undermining  noted. There is a large amount of sanguinous drainage noted. The wound margin is thickened. There is large (67-100%) red Pryde, Haniya A. ([id number]) granulation within the wound bed. There is no necrotic tissue within the wound bed. The periwound skin appearance exhibited: Localized Edema, Dry/Scaly, Moist. 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 She will get: 1. A chest x-ray and EKG done in preparation for her hyperbaric oxygen therapy 2. We will await her pathology and culture report before referring her to infectious disease 3. She will continue with oral antibiotics till the above reports are available 4. Get her insurance authorization done for hyperbaric oxygen therapy to treat her Wagner grade 3 diabetic foot ulcer. She will follow-up  with me at the [facility] clinic next week and start hyperbaric oxygen therapy there. Plan Wound Cleansing: Wound #1 Right Toe Great: Clean wound with Normal Saline. Anesthetic: Wound #1 Right Toe Great: Topical Lidocaine 4% cream applied to wound bed prior to debridement Primary Wound Dressing: Wound #1 Right Toe Great: Iodoform packing Gauze - 1/4 inch Secondary Dressing: Wound #1 Right Toe Great: Gauze and Kerlix/Conform Dressing Change Frequency: Wound #1 Right Toe Great: Change dressing every day. YULIYA, CONOVER ([id number]) Follow-up Appointments: Wound #1 Right Toe Great: Return Appointment in 1 week. Edema Control: Wound #1 Right Toe Great: Patient to wear own Juxtalite/Juzo compression garment. Medications-please add to medication list.: Wound #1 Right Toe Great: P.O. Antibiotics - continue clindamycin Radiology  ordered were: X-ray, Chest ordered were: EKG Now that her surgical debridement has been done I have discussed the rest of her treatment plan. She will get: 1. A chest x-ray and EKG done in preparation for her hyperbaric oxygen therapy 2. We will await her pathology and culture report before referring her to infectious disease 3. She will continue with oral antibiotics till the above reports are available 4. Get her insurance authorization done for hyperbaric oxygen therapy to treat her Wagner grade 3 diabetic foot ulcer. She will follow-up with me at the [facility] clinic next week and start hyperbaric oxygen therapy there. Electronic Signature(s) Signed: 04/02/2015 10:48:44 AM By: Christin Fudge MD, FACS Previous Signature: 04/01/2015 1:30:19 PM Version By: Christin Fudge MD, FACS Entered By: Christin Fudge on 04/02/2015 10:48:43 [name] ([id number]) -------------------------------------------------------------------------------- SuperBill Details Patient Name: [name] Date of Service: 04/01/2015 Medical Record Number: [id number] Patient Account Number: [id number] Date of Birth/Sex: 12-Mar-1960 (54 y.o. 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 with draining sinus, right ankle and foot Facility Procedures CPT4 Code: [id number] Description: 99213 - WOUND CARE VISIT-LEV 3 EST PT Modifier: Quantity: 1 Physician Procedures CPT4 Code Description: [id number] - WC PHYS LEVEL 3 - EST PT ICD-10 Description Diagnosis E11.621 Type 2 diabetes mellitus with foot ulcer L97.514 Non-pressure chronic ulcer of  other part of right f M86.471 Chronic osteomyelitis with draining sinus,  right an Modifier: oot with necros kle and foot Quantity: 1 is of bone Electronic Signature(s) Signed: 04/01/2015 1:30:34 PM By: Christin Fudge MD, FACS Entered By: Christin Fudge on 04/01/2015 13:30:34

## 2015-04-02 NOTE — Progress Notes (Addendum)
Caitlin Thompson, Caitlin Thompson ([id number]) Visit Report for 04/01/2015 Arrival Information Details Patient Name: Caitlin Thompson, Caitlin Thompson Date of Service: 04/01/2015 12:45 PM Medical Record Number: [id number] Patient Account Number: [id number] Date of Birth/Sex: 1960-10-19 (55 y.o. Female) Treating RN: Montey Hora Primary Care Physician: Tula Nakayama Other Clinician: Referring Physician: Tula Nakayama Treating Physician/Extender: Caitlin Thompson in Treatment: 1 Visit Information History Since Last Visit Added or deleted any medications: No Patient Arrived: Cane Any new allergies or adverse reactions: No Arrival Time: 12:53 Had a fall or experienced change in No Accompanied By: self activities of daily living that may affect Transfer Assistance: None risk of falls: Patient Identification Verified: Yes Signs or symptoms of abuse/neglect since last No Secondary Verification Process Completed: Yes visito Patient Requires Transmission-Based No Hospitalized since last visit: No Precautions: Pain Present Now: No Patient Has Alerts: Yes Patient Alerts: DM II Electronic Signature(s) Signed: 04/01/2015 5:48:41 PM By: Montey Hora Entered By: Montey Hora on 04/01/2015 12:55:29 Caitlin Thompson ([id number]) -------------------------------------------------------------------------------- Clinic Level of Care Assessment Details Patient Name: Caitlin Thompson Date of Service: 04/01/2015 12:45 PM Medical Record Number: [id number] Patient Account Number: [id number] Date of Birth/Sex: December 20, 1960 (55 y.o. Female) Treating RN: Montey Hora Primary Care Physician: Tula Nakayama Other Clinician: Referring Physician: Tula Nakayama Treating Physician/Extender: Caitlin Thompson in Treatment: 1 Clinic Level of Care Assessment Items TOOL 4 Quantity Score []  - Use when only an EandM is performed on FOLLOW-UP visit 0 ASSESSMENTS - Nursing Assessment / Reassessment X -  Reassessment of Co-morbidities (includes updates in patient status) 1 10 X - Reassessment of Adherence to Treatment Plan 1 5 ASSESSMENTS - Wound and Skin Assessment / Reassessment X - Simple Wound Assessment / Reassessment - one wound 1 5 []  - Complex Wound Assessment / Reassessment - multiple wounds 0 []  - Dermatologic / Skin Assessment (not related to wound area) 0 ASSESSMENTS - Focused Assessment []  - Circumferential Edema Measurements - multi extremities 0 []  - Nutritional Assessment / Counseling / Intervention 0 X - Lower Extremity Assessment (monofilament, tuning fork, pulses) 1 5 []  - Peripheral Arterial Disease Assessment (using hand held doppler) 0 ASSESSMENTS - Ostomy and/or Continence Assessment and Care []  - Incontinence Assessment and Management 0 []  - Ostomy Care Assessment and Management (repouching, etc.) 0 PROCESS - Coordination of Care X - Simple Patient / Family Education for ongoing care 1 15 []  - Complex (extensive) Patient / Family Education for ongoing care 0 []  - Staff obtains Programmer, systems, Records, Test Results / Process Orders 0 []  - Staff telephones HHA, Nursing Homes / Clarify orders / etc 0 []  - Routine Transfer to another Facility (non-emergent condition) 0 Caitlin Thompson, Caitlin A. ([id number]) []  - Routine Hospital Admission (non-emergent condition) 0 []  - New Admissions / Biomedical engineer / Ordering NPWT, Apligraf, etc. 0 []  - Emergency Hospital Admission (emergent condition) 0 X - Simple Discharge Coordination 1 10 []  - Complex (extensive) Discharge Coordination 0 PROCESS - Special Needs []  - Pediatric / Minor Patient Management 0 []  - Isolation Patient Management 0 []  - Hearing / Language / Visual special needs 0 []  - Assessment of Community assistance (transportation, D/C planning, etc.) 0 []  - Additional assistance / Altered mentation 0 []  - Support Surface(s) Assessment (bed, cushion, seat, etc.) 0 INTERVENTIONS - Wound Cleansing / Measurement X -  Simple Wound Cleansing - one wound 1 5 []  - Complex Wound Cleansing - multiple wounds 0 X - Wound Imaging (photographs - any number of wounds) 1 5 []  - Wound Tracing (instead  of photographs) 0 X - Simple Wound Measurement - one wound 1 5 []  - Complex Wound Measurement - multiple wounds 0 INTERVENTIONS - Wound Dressings X - Small Wound Dressing one or multiple wounds 1 10 []  - Medium Wound Dressing one or multiple wounds 0 []  - Large Wound Dressing one or multiple wounds 0 []  - Application of Medications - topical 0 []  - Application of Medications - injection 0 INTERVENTIONS - Miscellaneous []  - External ear exam 0 Caitlin Thompson, Caitlin A. ([id number]) []  - Specimen Collection (cultures, biopsies, blood, body fluids, etc.) 0 []  - Specimen(s) / Culture(s) sent or taken to Lab for analysis 0 []  - Patient Transfer (multiple staff / Caitlin Thompson Lift / Similar devices) 0 []  - Simple Staple / Suture removal (25 or less) 0 []  - Complex Staple / Suture removal (26 or more) 0 []  - Hypo / Hyperglycemic Management (close monitor of Blood Glucose) 0 []  - Ankle / Brachial Index (ABI) - do not check if billed separately 0 X - Vital Signs 1 5 Has the patient been seen at the hospital within the last three years: Yes Total Score: 80 Level Of Care: New/Established - Level 3 Electronic Signature(s) Signed: 04/01/2015 5:48:41 PM By: Montey Hora Entered By: Montey Hora on 04/01/2015 13:17:50 Caitlin Thompson ([id number]) -------------------------------------------------------------------------------- Encounter Discharge Information Details Patient Name: Caitlin Thompson Date of Service: 04/01/2015 12:45 PM Medical Record Number: [id number] Patient Account Number: [id number] Date of Birth/Sex: 1961-01-31 (55 y.o. Female) Treating RN: Montey Hora Primary Care Physician: Tula Nakayama Other Clinician: Referring Physician: Tula Nakayama Treating Physician/Extender: Caitlin Thompson in  Treatment: 1 Encounter Discharge Information Items Schedule Follow-up Appointment: No Medication Reconciliation completed No and provided to Patient/Care Caitlin Thompson: Provided on Clinical Summary of Care: 04/01/2015 Form Type Recipient Paper Patient LP Electronic Signature(s) Signed: 04/01/2015 1:31:58 PM By: Caitlin Thompson Entered By: Caitlin Thompson on 04/01/2015 13:31:58 Caitlin Thompson ([id number]) -------------------------------------------------------------------------------- Lower Extremity Assessment Details Patient Name: Caitlin Thompson Date of Service: 04/01/2015 12:45 PM Medical Record Number: [id number] Patient Account Number: [id number] Date of Birth/Sex: Sep 08, 1960 (55 y.o. Female) Treating RN: Montey Hora Primary Care Physician: Tula Nakayama Other Clinician: Referring Physician: Tula Nakayama Treating Physician/Extender: Caitlin Thompson in Treatment: 1 Vascular Assessment Pulses: Posterior Tibial Dorsalis Pedis Palpable: [Right:Yes] Extremity colors, hair growth, and conditions: Extremity Color: [Right:Hyperpigmented] Hair Growth on Extremity: [Right:No] Temperature of Extremity: [Right:Warm] Capillary Refill: [Right:< 3 seconds] Electronic Signature(s) Signed: 04/01/2015 1:08:43 PM By: Montey Hora Entered By: Montey Hora on 04/01/2015 13:08:43 Caitlin Thompson ([id number]) -------------------------------------------------------------------------------- Multi Wound Chart Details Patient Name: Caitlin Thompson Date of Service: 04/01/2015 12:45 PM Medical Record Number: [id number] Patient Account Number: [id number] Date of Birth/Sex: [birth year] (55 y.o. Female) Treating RN: Montey Hora Primary Care Physician: Tula Nakayama Other Clinician: Referring Physician: Tula Nakayama Treating Physician/Extender: Caitlin Thompson in Treatment: 1 Vital Signs Height(in): 63 Pulse(bpm): 91 Weight(lbs): 168 Blood  Pressure 147/78 (mmHg): Body Mass Index(BMI): 30 Temperature(F): 98.4 Respiratory Rate 18 (breaths/min): Wound Assessments Treatment Notes Electronic Signature(s) Signed: 04/01/2015 1:06:28 PM By: Montey Hora Entered By: Montey Hora on 04/01/2015 13:06:28 Caitlin Thompson ([id number]) -------------------------------------------------------------------------------- Multi-Disciplinary Care Plan Details Patient Name: Caitlin Thompson Date of Service: 04/01/2015 12:45 PM Medical Record Number: [id number] Patient Account Number: [id number] Date of Birth/Sex: 1960-03-29 (55 y.o. Female) Treating RN: Montey Hora Primary Care Physician: Tula Nakayama Other Clinician: Referring Physician: Tula Nakayama Treating Physician/Extender: Caitlin Thompson in Treatment: 1 Active Inactive Electronic Signature(s) Signed: 04/14/2015 2:50:06 PM By: Montey Hora Previous  Signature: 04/01/2015 1:06:22 PM Version By: Montey Hora Entered By: Montey Hora on 04/14/2015 14:50:06 Caitlin Thompson ([id number]) -------------------------------------------------------------------------------- Wound Assessment Details Patient Name: Caitlin Thompson Date of Service: 04/01/2015 12:45 PM Medical Record Number: [id number] Patient Account Number: [id number] Date of Birth/Sex: 16-Nov-1960 (55 y.o. Female) Treating RN: Montey Hora Primary Care Physician: Tula Nakayama Other Clinician: Referring Physician: Tula Nakayama Treating Physician/Extender: Caitlin Thompson in Treatment: 1 Wound Status Wound Number: 1 Primary Trauma, Other Etiology: Wound Location: Right Toe Great Wound Status: Open Wounding Event: Trauma Comorbid Hypertension, Type II Diabetes, Date Acquired: 01/20/2015 History: Neuropathy Weeks Of Treatment: 1 Clustered Wound: No Wound Measurements Length: (cm) 1 Width: (cm) 0.9 Depth: (cm) 1.5 Area: (cm) 0.707 Volume: (cm) 1.06 %  Reduction in Area: -7.1% % Reduction in Volume: -0.4% Epithelialization: None Tunneling: No Undermining: No Wound Description Classification: Partial Thickness Foul Odor A Diabetic Severity Caitlin Thompson): Grade 1 Wound Margin: Thickened Exudate Amount: Large Exudate Type: Sanguinous Exudate Color: red fter Cleansing: No Wound Bed Granulation Amount: Large (67-100%) Exposed Structure Granulation Quality: Red Fascia Exposed: No Necrotic Amount: None Present (0%) Fat Layer Exposed: No Tendon Exposed: No Muscle Exposed: No Joint Exposed: No Bone Exposed: No Limited to Skin Breakdown Periwound Skin Texture Texture Color No Abnormalities Noted: No No Abnormalities Noted: No Localized Edema: Yes Moisture No Abnormalities Noted: No Petz, Denean A. ([id number]) Dry / Scaly: Yes Moist: Yes Wound Preparation Ulcer Cleansing: Rinsed/Irrigated with Saline Topical Anesthetic Applied: None Electronic Signature(s) Signed: 04/01/2015 5:48:41 PM By: Montey Hora Entered By: Montey Hora on 04/01/2015 13:30:49 Caitlin Thompson ([id number]) -------------------------------------------------------------------------------- Vitals Details Patient Name: Caitlin Thompson Date of Service: 04/01/2015 12:45 PM Medical Record Number: [id number] Patient Account Number: [id number] Date of Birth/Sex: Jul 22, 1960 (55 y.o. Female) Treating RN: Montey Hora Primary Care Physician: Tula Nakayama Other Clinician: Referring Physician: Tula Nakayama Treating Physician/Extender: Caitlin Thompson in Treatment: 1 Vital Signs Time Taken: 12:55 Temperature (F): 98.4 Height (in): 63 Pulse (bpm): 91 Weight (lbs): 168 Respiratory Rate (breaths/min): 18 Body Mass Index (BMI): 29.8 Blood Pressure (mmHg): 147/78 Reference Range: 80 - 120 mg / dl Electronic Signature(s) Signed: 04/01/2015 5:48:41 PM By: Montey Hora Entered By: Montey Hora on 04/01/2015 12:55:52

## 2015-04-02 NOTE — Telephone Encounter (Signed)
Patient is aware and states that she was already titrating and is now on more than that

## 2015-04-05 ENCOUNTER — Ambulatory Visit (INDEPENDENT_AMBULATORY_CARE_PROVIDER_SITE_OTHER): Payer: Commercial Managed Care - HMO

## 2015-04-05 ENCOUNTER — Ambulatory Visit (INDEPENDENT_AMBULATORY_CARE_PROVIDER_SITE_OTHER): Payer: Commercial Managed Care - HMO | Admitting: Podiatry

## 2015-04-05 VITALS — Temp 99.6°F

## 2015-04-05 DIAGNOSIS — M86171 Other acute osteomyelitis, right ankle and foot: Secondary | ICD-10-CM | POA: Diagnosis not present

## 2015-04-05 DIAGNOSIS — L97514 Non-pressure chronic ulcer of other part of right foot with necrosis of bone: Secondary | ICD-10-CM | POA: Diagnosis not present

## 2015-04-05 DIAGNOSIS — Z9889 Other specified postprocedural states: Secondary | ICD-10-CM

## 2015-04-05 MED ORDER — CLINDAMYCIN HCL 300 MG PO CAPS: 300.0000 mg | ORAL_CAPSULE | Freq: Three times a day (TID) | ORAL | Status: DC

## 2015-04-06 ENCOUNTER — Encounter: Payer: Self-pay | Admitting: Podiatry

## 2015-04-06 ENCOUNTER — Telehealth: Payer: Self-pay | Admitting: *Deleted

## 2015-04-06 DIAGNOSIS — R0989 Other specified symptoms and signs involving the circulatory and respiratory systems: Secondary | ICD-10-CM

## 2015-04-06 NOTE — Telephone Encounter (Addendum)
Dr. Jacqualyn Posey requested pt's fungal culture sensitivity also include Fluconazole, Casporfungin, Vanconazole, and Itraconazole.  Orders called to [place] at [facility].  Faxed arterial dopplers orders to [place]. Faxed referral, required Infectious Disease form, pt demographics and clinicals.  Informed pt of all her referrals and her medication at the [facility] in [place].  04/09/2015-PT ASKED if she needed to continue the antibiotic now Dr. Jacqualyn Posey had ordered the anti-fungal medication.  I told pt to take both but not together.  04/16/2015-DrJacqualyn Posey reviewed pt's vascular studies as normal. Results called to pt.  04/27/2015-DR. [facility] REVIEWED SURGICAL labs and states pt may begin to take Fluconazole 200mg  daily, stop Fluconazole 400mg  daily.  I SPOKE WITH PT AND GAVE THE NEW Fluconazole orders and told her we were referring to Infectious Disease.  Pt states she has an appt on 05/04/2015 Dr. Johnnye Sima at Infectious Disease.  04/28/2015-DR. [place] TISSUE CULTURE 03/31/2015, ordered Clindamycin 300mg  #21 1 capsule tid, and to fax results to Infectious Disease.  Faxed results to [phone].

## 2015-04-07 ENCOUNTER — Encounter (HOSPITAL_BASED_OUTPATIENT_CLINIC_OR_DEPARTMENT_OTHER): Payer: Commercial Managed Care - HMO | Attending: Internal Medicine

## 2015-04-07 DIAGNOSIS — M86471 Chronic osteomyelitis with draining sinus, right ankle and foot: Secondary | ICD-10-CM | POA: Insufficient documentation

## 2015-04-07 DIAGNOSIS — L97511 Non-pressure chronic ulcer of other part of right foot limited to breakdown of skin: Secondary | ICD-10-CM | POA: Diagnosis not present

## 2015-04-07 DIAGNOSIS — E11621 Type 2 diabetes mellitus with foot ulcer: Secondary | ICD-10-CM | POA: Insufficient documentation

## 2015-04-07 DIAGNOSIS — E785 Hyperlipidemia, unspecified: Secondary | ICD-10-CM | POA: Insufficient documentation

## 2015-04-07 DIAGNOSIS — E1169 Type 2 diabetes mellitus with other specified complication: Secondary | ICD-10-CM | POA: Insufficient documentation

## 2015-04-07 DIAGNOSIS — E1165 Type 2 diabetes mellitus with hyperglycemia: Secondary | ICD-10-CM | POA: Diagnosis not present

## 2015-04-07 MED ORDER — FLUCONAZOLE 200 MG PO TABS: 400.0000 mg | ORAL_TABLET | Freq: Every day | ORAL | Status: DC

## 2015-04-07 NOTE — Progress Notes (Signed)
Patient ID: Caitlin Thompson, female   DOB: 08-Nov-1960, 55 y.o.   MRN: [id number]  Subjective: [name] is a 55 y.o. is seen today in office s/p right hallux wound debridement and bone biopsy. She's doing well and she is been changing the bandage daily with saline wet-to-dry dressing and Iodosorb packing. She denies any drainage since her last appointment with Dr. Con Memos. She states otherwise quite a bit of bloody drainage on the bandage medially after surgery but that is resolved. She denies any pus. Denies any surrounding redness or red streaks. Continues to have swelling to the right big toe. Denies any systemic complaints such as fevers, chills, nausea, vomiting. No calf pain, chest pain, shortness of breath.   Objective: General: No acute distress, AAOx3  DP/PT pulses palpable 2/4, CRT < 3 sec to all digits.  Protective sensation intact. Motor function intact.  Right foot: Ulceration of plantar aspect of the right hallux today measures 0.9 x 0.9 cm with exposed bone. There is no undermining or tunneling. There is no swelling erythema, ascending cellulitis, fluctuance, crepitus, malodor or drainage/purulence. There is edema to the right hallux which continues appears to be unchanged. There are no other open lesions or pre-ulcer lesions identified bilaterally. No pain with calf compression, swelling, warmth, erythema.   Assessment and Plan:  Status post right foot wound debridement/bone biopsy, osteomyelitis  -Treatment options discussed including all alternatives, risks, and complications -At this time I discussed pathology results with her which showed benign bone with scattered chronic inflammation. Awaiting culture results. I discussed that although the pathology is negative for osteomyelitis is still believe it is still present. -Continue daily dressing changes. Continue to follow up with Dr. Con Memos for wound care.  -Again discussed limb salvage versus amputation and risks and  benefits of each. She elects to proceed with limb salvage at this time. -Will order vascular studies to ensure adequate circulation although she does have palpable pulses. -Monitor for any clinical signs or symptoms of infection and DVT/PE and directed to call the office immediately should any occur or go to the ER. -As she is following up with Dr. Con Memos I will defer wound care to him. In the meantime I encouraged her to call the office with any questions, concerns or any change in symptoms.  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 We'll go ahead and start fluconazole 400 mg daily and referred to infectious disease. I will request sensativities for fluconazole, caspofungin, vanconazole, itraconazole.   Celesta Gentile, DPM

## 2015-04-08 ENCOUNTER — Other Ambulatory Visit: Payer: Self-pay | Admitting: Podiatry

## 2015-04-08 DIAGNOSIS — R0989 Other specified symptoms and signs involving the circulatory and respiratory systems: Secondary | ICD-10-CM

## 2015-04-09 ENCOUNTER — Ambulatory Visit (HOSPITAL_COMMUNITY)
Admission: RE | Admit: 2015-04-09 | Discharge: 2015-04-09 | Disposition: A | Discharge status: Home / Self Care | Payer: Commercial Managed Care - HMO | Source: Ambulatory Visit | Attending: Cardiovascular Disease | Admitting: Cardiovascular Disease

## 2015-04-09 DIAGNOSIS — E785 Hyperlipidemia, unspecified: Secondary | ICD-10-CM | POA: Insufficient documentation

## 2015-04-09 DIAGNOSIS — R0989 Other specified symptoms and signs involving the circulatory and respiratory systems: Secondary | ICD-10-CM | POA: Diagnosis not present

## 2015-04-09 DIAGNOSIS — I10 Essential (primary) hypertension: Secondary | ICD-10-CM | POA: Diagnosis not present

## 2015-04-09 DIAGNOSIS — E119 Type 2 diabetes mellitus without complications: Secondary | ICD-10-CM | POA: Insufficient documentation

## 2015-04-12 ENCOUNTER — Ambulatory Visit
Admission: RE | Admit: 2015-04-12 | Discharge: 2015-04-12 | Disposition: A | Discharge status: Home / Self Care | Payer: Commercial Managed Care - HMO | Source: Ambulatory Visit | Attending: Surgery | Admitting: Surgery

## 2015-04-12 DIAGNOSIS — Z01810 Encounter for preprocedural cardiovascular examination: Secondary | ICD-10-CM | POA: Diagnosis not present

## 2015-04-12 DIAGNOSIS — E11621 Type 2 diabetes mellitus with foot ulcer: Secondary | ICD-10-CM | POA: Diagnosis not present

## 2015-04-12 LAB — GLUCOSE, CAPILLARY
GLUCOSE-CAPILLARY: 155 mg/dL — AB (ref 65–99)
Glucose-Capillary: 108 mg/dL — ABNORMAL HIGH (ref 65–99)
Glucose-Capillary: 155 mg/dL — ABNORMAL HIGH (ref 65–99)

## 2015-04-13 DIAGNOSIS — E11621 Type 2 diabetes mellitus with foot ulcer: Secondary | ICD-10-CM | POA: Diagnosis not present

## 2015-04-13 LAB — GLUCOSE, CAPILLARY
Glucose-Capillary: 213 mg/dL — ABNORMAL HIGH (ref 65–99)
Glucose-Capillary: 133 mg/dL — ABNORMAL HIGH (ref 65–99)

## 2015-04-14 DIAGNOSIS — E11621 Type 2 diabetes mellitus with foot ulcer: Secondary | ICD-10-CM | POA: Diagnosis not present

## 2015-04-14 LAB — GLUCOSE, CAPILLARY
GLUCOSE-CAPILLARY: 104 mg/dL — AB (ref 65–99)
Glucose-Capillary: 150 mg/dL — ABNORMAL HIGH (ref 65–99)

## 2015-04-15 DIAGNOSIS — E11621 Type 2 diabetes mellitus with foot ulcer: Secondary | ICD-10-CM | POA: Diagnosis not present

## 2015-04-15 LAB — GLUCOSE, CAPILLARY
GLUCOSE-CAPILLARY: 87 mg/dL (ref 65–99)
Glucose-Capillary: 140 mg/dL — ABNORMAL HIGH (ref 65–99)

## 2015-04-16 DIAGNOSIS — E11621 Type 2 diabetes mellitus with foot ulcer: Secondary | ICD-10-CM | POA: Diagnosis not present

## 2015-04-16 LAB — GLUCOSE, CAPILLARY
GLUCOSE-CAPILLARY: 198 mg/dL — AB (ref 65–99)
Glucose-Capillary: 102 mg/dL — ABNORMAL HIGH (ref 65–99)

## 2015-04-16 NOTE — Telephone Encounter (Signed)
-----   Message from Trula Slade, DPM sent at 04/15/2015  7:02 PM EST ----- Can you please let her know that her vascular studies are normal? Thank you.

## 2015-04-19 DIAGNOSIS — E11621 Type 2 diabetes mellitus with foot ulcer: Secondary | ICD-10-CM | POA: Diagnosis not present

## 2015-04-19 LAB — GLUCOSE, CAPILLARY
GLUCOSE-CAPILLARY: 136 mg/dL — AB (ref 65–99)
Glucose-Capillary: 90 mg/dL (ref 65–99)
Glucose-Capillary: 139 mg/dL — ABNORMAL HIGH (ref 65–99)

## 2015-04-20 DIAGNOSIS — E11621 Type 2 diabetes mellitus with foot ulcer: Secondary | ICD-10-CM | POA: Diagnosis not present

## 2015-04-20 LAB — GLUCOSE, CAPILLARY
Glucose-Capillary: 136 mg/dL — ABNORMAL HIGH (ref 65–99)
Glucose-Capillary: 123 mg/dL — ABNORMAL HIGH (ref 65–99)

## 2015-04-21 DIAGNOSIS — E11621 Type 2 diabetes mellitus with foot ulcer: Secondary | ICD-10-CM | POA: Diagnosis not present

## 2015-04-21 LAB — GLUCOSE, CAPILLARY
GLUCOSE-CAPILLARY: 232 mg/dL — AB (ref 65–99)
Glucose-Capillary: 152 mg/dL — ABNORMAL HIGH (ref 65–99)

## 2015-04-22 ENCOUNTER — Encounter (HOSPITAL_BASED_OUTPATIENT_CLINIC_OR_DEPARTMENT_OTHER): Payer: Commercial Managed Care - HMO

## 2015-04-22 DIAGNOSIS — E11621 Type 2 diabetes mellitus with foot ulcer: Secondary | ICD-10-CM | POA: Diagnosis not present

## 2015-04-22 LAB — GLUCOSE, CAPILLARY
GLUCOSE-CAPILLARY: 127 mg/dL — AB (ref 65–99)
GLUCOSE-CAPILLARY: 60 mg/dL — AB (ref 65–99)
GLUCOSE-CAPILLARY: 79 mg/dL (ref 65–99)

## 2015-04-26 DIAGNOSIS — E11621 Type 2 diabetes mellitus with foot ulcer: Secondary | ICD-10-CM | POA: Diagnosis not present

## 2015-04-26 LAB — GLUCOSE, CAPILLARY
Glucose-Capillary: 169 mg/dL — ABNORMAL HIGH (ref 65–99)
Glucose-Capillary: 119 mg/dL — ABNORMAL HIGH (ref 65–99)

## 2015-04-27 DIAGNOSIS — E11621 Type 2 diabetes mellitus with foot ulcer: Secondary | ICD-10-CM | POA: Diagnosis not present

## 2015-04-27 LAB — GLUCOSE, CAPILLARY
GLUCOSE-CAPILLARY: 188 mg/dL — AB (ref 65–99)
Glucose-Capillary: 213 mg/dL — ABNORMAL HIGH (ref 65–99)

## 2015-04-28 DIAGNOSIS — E11621 Type 2 diabetes mellitus with foot ulcer: Secondary | ICD-10-CM | POA: Diagnosis not present

## 2015-04-28 LAB — GLUCOSE, CAPILLARY
GLUCOSE-CAPILLARY: 125 mg/dL — AB (ref 65–99)
GLUCOSE-CAPILLARY: 102 mg/dL — AB (ref 65–99)

## 2015-04-28 MED ORDER — CLINDAMYCIN HCL 300 MG PO CAPS: 300.0000 mg | ORAL_CAPSULE | Freq: Three times a day (TID) | ORAL | Status: DC

## 2015-04-29 DIAGNOSIS — E11621 Type 2 diabetes mellitus with foot ulcer: Secondary | ICD-10-CM | POA: Diagnosis not present

## 2015-04-29 LAB — GLUCOSE, CAPILLARY
GLUCOSE-CAPILLARY: 190 mg/dL — AB (ref 65–99)
GLUCOSE-CAPILLARY: 150 mg/dL — AB (ref 65–99)

## 2015-04-30 DIAGNOSIS — E11621 Type 2 diabetes mellitus with foot ulcer: Secondary | ICD-10-CM | POA: Diagnosis not present

## 2015-04-30 LAB — GLUCOSE, CAPILLARY
Glucose-Capillary: 170 mg/dL — ABNORMAL HIGH (ref 65–99)
Glucose-Capillary: 136 mg/dL — ABNORMAL HIGH (ref 65–99)

## 2015-05-03 DIAGNOSIS — E11621 Type 2 diabetes mellitus with foot ulcer: Secondary | ICD-10-CM | POA: Diagnosis not present

## 2015-05-03 LAB — GLUCOSE, CAPILLARY
GLUCOSE-CAPILLARY: 93 mg/dL (ref 65–99)
Glucose-Capillary: 124 mg/dL — ABNORMAL HIGH (ref 65–99)

## 2015-05-04 ENCOUNTER — Encounter: Payer: Self-pay | Admitting: Infectious Diseases

## 2015-05-04 ENCOUNTER — Ambulatory Visit (INDEPENDENT_AMBULATORY_CARE_PROVIDER_SITE_OTHER): Payer: Commercial Managed Care - HMO | Admitting: Infectious Diseases

## 2015-05-04 DIAGNOSIS — E11621 Type 2 diabetes mellitus with foot ulcer: Secondary | ICD-10-CM | POA: Diagnosis not present

## 2015-05-04 DIAGNOSIS — E1169 Type 2 diabetes mellitus with other specified complication: Secondary | ICD-10-CM | POA: Diagnosis not present

## 2015-05-04 DIAGNOSIS — M869 Osteomyelitis, unspecified: Secondary | ICD-10-CM

## 2015-05-04 LAB — GLUCOSE, CAPILLARY
Glucose-Capillary: 197 mg/dL — ABNORMAL HIGH (ref 65–99)
Glucose-Capillary: 125 mg/dL — ABNORMAL HIGH (ref 65–99)

## 2015-05-04 MED ORDER — FLUCONAZOLE 200 MG PO TABS: 200.0000 mg | ORAL_TABLET | Freq: Every day | ORAL | Status: DC

## 2015-05-04 NOTE — Assessment & Plan Note (Addendum)
She is doing very well.  Will stop her clinda Will aim to continue her diflucan for 1 more month. I have called the lab and asked for the sensi that Dr Earleen Newport requested.   Will not check her esr and crp as we do not have baseline.  She wants to go back to her regular exercise (running 3 miles/day). I advised her she needs a good fitting shoe to do this and that she should f/u with her PCP re:  Hopefully her rash will improve with being off clinda.  Will see her back prn.

## 2015-05-04 NOTE — Progress Notes (Signed)
   Subjective:    Patient ID: Caitlin Thompson, female    DOB: 02/08/1961, 55 y.o.   MRN: [id number]  HPI 55 yo F with hx of DM2 (since 2007, with recent A1C of 13%) and injury to R foot in October 2016. She developed a draining wound from her R great toe and was seen in her PCP's office Mar 18, 2015. She was started on clinda and seemed to improve.  She underwent plain film of her foot which showed osteo (per her notes). She did not want to have the toe amputated, and underwent bone bx which showed chronic inflammation but not osteo. Her Cx ultimately revealed C albicans. She was started on fluconazole on 04-05-15. She has continued on the clinda. She underwent local I & D 04-27-15 Has gotten hyperbaric O2 as well.  Today she feels like her foot is 98% better. No further d/c coming from wound.  States she was seen in [place] clinic and has "the circulation of a 55 yo"  Weight bearing is excellent, has gotten back to exercising. She is walking more. Is back to wearing her regular shoes.   The past medical history, family history and social history were reviewed/updated in EPIC  Review of Systems  Constitutional: Negative for appetite change and unexpected weight change.  Eyes: Negative for visual disturbance.  Respiratory: Negative for cough and shortness of breath.   Cardiovascular: Negative for chest pain and leg swelling.  Gastrointestinal: Negative for diarrhea and constipation.  Genitourinary: Negative for difficulty urinating.  Neurological: Negative for numbness and headaches.   FSG have been 112-141 Has had optho in last year.     Objective:   Physical Exam  Constitutional: She appears well-developed and well-nourished.  HENT:  Mouth/Throat: No oropharyngeal exudate.  Eyes: EOM are normal. Pupils are equal, round, and reactive to light.  Neck: Neck supple.  Cardiovascular: Normal rate, regular rhythm and normal heart sounds.   Pulmonary/Chest: Effort normal and breath sounds  normal.  Abdominal: Soft. Bowel sounds are normal. There is no tenderness. There is no rebound.  Musculoskeletal: She exhibits no edema.       Feet:  Lymphadenopathy:    She has no cervical adenopathy.  Skin: Rash noted.          Assessment & Plan:

## 2015-05-05 ENCOUNTER — Encounter (HOSPITAL_BASED_OUTPATIENT_CLINIC_OR_DEPARTMENT_OTHER): Payer: Commercial Managed Care - HMO | Attending: Surgery

## 2015-05-05 ENCOUNTER — Ambulatory Visit: Payer: Commercial Managed Care - HMO | Admitting: Family Medicine

## 2015-05-05 DIAGNOSIS — M86471 Chronic osteomyelitis with draining sinus, right ankle and foot: Secondary | ICD-10-CM | POA: Diagnosis not present

## 2015-05-05 DIAGNOSIS — E11621 Type 2 diabetes mellitus with foot ulcer: Secondary | ICD-10-CM | POA: Diagnosis not present

## 2015-05-05 DIAGNOSIS — L97514 Non-pressure chronic ulcer of other part of right foot with necrosis of bone: Secondary | ICD-10-CM | POA: Insufficient documentation

## 2015-05-05 DIAGNOSIS — E1169 Type 2 diabetes mellitus with other specified complication: Secondary | ICD-10-CM | POA: Insufficient documentation

## 2015-05-05 LAB — GLUCOSE, CAPILLARY
Glucose-Capillary: 170 mg/dL — ABNORMAL HIGH (ref 65–99)
Glucose-Capillary: 115 mg/dL — ABNORMAL HIGH (ref 65–99)

## 2015-05-06 DIAGNOSIS — E11621 Type 2 diabetes mellitus with foot ulcer: Secondary | ICD-10-CM | POA: Diagnosis not present

## 2015-05-06 LAB — GLUCOSE, CAPILLARY
GLUCOSE-CAPILLARY: 186 mg/dL — AB (ref 65–99)
GLUCOSE-CAPILLARY: 107 mg/dL — AB (ref 65–99)

## 2015-05-07 DIAGNOSIS — E11621 Type 2 diabetes mellitus with foot ulcer: Secondary | ICD-10-CM | POA: Diagnosis not present

## 2015-05-07 LAB — GLUCOSE, CAPILLARY
GLUCOSE-CAPILLARY: 143 mg/dL — AB (ref 65–99)
Glucose-Capillary: 96 mg/dL (ref 65–99)

## 2015-05-10 DIAGNOSIS — E11621 Type 2 diabetes mellitus with foot ulcer: Secondary | ICD-10-CM | POA: Diagnosis not present

## 2015-05-10 LAB — GLUCOSE, CAPILLARY
GLUCOSE-CAPILLARY: 280 mg/dL — AB (ref 65–99)
GLUCOSE-CAPILLARY: 163 mg/dL — AB (ref 65–99)

## 2015-05-11 ENCOUNTER — Encounter (HOSPITAL_BASED_OUTPATIENT_CLINIC_OR_DEPARTMENT_OTHER): Payer: Commercial Managed Care - HMO

## 2015-05-11 ENCOUNTER — Other Ambulatory Visit: Payer: Self-pay

## 2015-05-11 DIAGNOSIS — E11621 Type 2 diabetes mellitus with foot ulcer: Secondary | ICD-10-CM | POA: Diagnosis not present

## 2015-05-11 LAB — GLUCOSE, CAPILLARY
GLUCOSE-CAPILLARY: 165 mg/dL — AB (ref 65–99)
Glucose-Capillary: 125 mg/dL — ABNORMAL HIGH (ref 65–99)

## 2015-05-11 MED ORDER — INSULIN DETEMIR 100 UNIT/ML FLEXPEN: 50.0000 [IU] | PEN_INJECTOR | Freq: Every day | SUBCUTANEOUS | Status: DC

## 2015-05-12 ENCOUNTER — Telehealth: Payer: Self-pay

## 2015-05-12 DIAGNOSIS — E11621 Type 2 diabetes mellitus with foot ulcer: Secondary | ICD-10-CM | POA: Diagnosis not present

## 2015-05-12 LAB — GLUCOSE, CAPILLARY
GLUCOSE-CAPILLARY: 174 mg/dL — AB (ref 65–99)
Glucose-Capillary: 131 mg/dL — ABNORMAL HIGH (ref 65–99)

## 2015-05-12 NOTE — Telephone Encounter (Signed)
Fasting (past 10 days) 101,121,115,138,121,201,116,133,133,128  Advised readings are within the goal range of 90-130 and to keep up the great work and her next a1c should have decreased!

## 2015-05-13 DIAGNOSIS — E11621 Type 2 diabetes mellitus with foot ulcer: Secondary | ICD-10-CM | POA: Diagnosis not present

## 2015-05-14 DIAGNOSIS — E11621 Type 2 diabetes mellitus with foot ulcer: Secondary | ICD-10-CM | POA: Diagnosis not present

## 2015-05-14 LAB — GLUCOSE, CAPILLARY
GLUCOSE-CAPILLARY: 164 mg/dL — AB (ref 65–99)
GLUCOSE-CAPILLARY: 116 mg/dL — AB (ref 65–99)
GLUCOSE-CAPILLARY: 249 mg/dL — AB (ref 65–99)
GLUCOSE-CAPILLARY: 195 mg/dL — AB (ref 65–99)

## 2015-05-15 ENCOUNTER — Encounter: Payer: Self-pay | Admitting: Podiatry

## 2015-05-15 NOTE — Progress Notes (Signed)
  Received updated cultures from the surgery center. Will send to Dr. Johnnye Sima

## 2015-05-16 ENCOUNTER — Other Ambulatory Visit: Payer: Self-pay | Admitting: Family Medicine

## 2015-05-17 ENCOUNTER — Telehealth: Payer: Self-pay | Admitting: Family Medicine

## 2015-05-17 ENCOUNTER — Encounter: Payer: Self-pay | Admitting: Internal Medicine

## 2015-05-17 DIAGNOSIS — Z114 Encounter for screening for human immunodeficiency virus [HIV]: Secondary | ICD-10-CM

## 2015-05-17 DIAGNOSIS — E11621 Type 2 diabetes mellitus with foot ulcer: Secondary | ICD-10-CM | POA: Diagnosis not present

## 2015-05-17 DIAGNOSIS — E11311 Type 2 diabetes mellitus with unspecified diabetic retinopathy with macular edema: Secondary | ICD-10-CM

## 2015-05-17 DIAGNOSIS — Z1159 Encounter for screening for other viral diseases: Secondary | ICD-10-CM

## 2015-05-17 DIAGNOSIS — E1165 Type 2 diabetes mellitus with hyperglycemia: Principal | ICD-10-CM

## 2015-05-17 NOTE — Telephone Encounter (Signed)
Patient has questions regarding her insulin, please advise?

## 2015-05-17 NOTE — Progress Notes (Signed)
Patient ID: Caitlin Thompson, female   DOB: Oct 02, 1960, 55 y.o.   MRN: [id number] Dr Jacqualyn Posey performed a Rt foot hallux bone biopsy on 03/31/15 at [facility]

## 2015-05-17 NOTE — Telephone Encounter (Signed)
Called and left message for patient to return call.  

## 2015-05-18 DIAGNOSIS — E11621 Type 2 diabetes mellitus with foot ulcer: Secondary | ICD-10-CM | POA: Diagnosis not present

## 2015-05-18 LAB — GLUCOSE, CAPILLARY
GLUCOSE-CAPILLARY: 206 mg/dL — AB (ref 65–99)
Glucose-Capillary: 229 mg/dL — ABNORMAL HIGH (ref 65–99)

## 2015-05-19 DIAGNOSIS — E11621 Type 2 diabetes mellitus with foot ulcer: Secondary | ICD-10-CM | POA: Diagnosis not present

## 2015-05-19 LAB — GLUCOSE, CAPILLARY
GLUCOSE-CAPILLARY: 190 mg/dL — AB (ref 65–99)
GLUCOSE-CAPILLARY: 147 mg/dL — AB (ref 65–99)
GLUCOSE-CAPILLARY: 126 mg/dL — AB (ref 65–99)
Glucose-Capillary: 242 mg/dL — ABNORMAL HIGH (ref 65–99)

## 2015-05-20 ENCOUNTER — Ambulatory Visit: Payer: Commercial Managed Care - HMO | Admitting: Internal Medicine

## 2015-05-20 DIAGNOSIS — E11621 Type 2 diabetes mellitus with foot ulcer: Secondary | ICD-10-CM | POA: Diagnosis not present

## 2015-05-21 DIAGNOSIS — E11621 Type 2 diabetes mellitus with foot ulcer: Secondary | ICD-10-CM | POA: Diagnosis not present

## 2015-05-21 LAB — GLUCOSE, CAPILLARY
Glucose-Capillary: 188 mg/dL — ABNORMAL HIGH (ref 65–99)
Glucose-Capillary: 189 mg/dL — ABNORMAL HIGH (ref 65–99)

## 2015-05-21 NOTE — Telephone Encounter (Signed)
Called and left message for patient to return call.  

## 2015-05-24 DIAGNOSIS — E11621 Type 2 diabetes mellitus with foot ulcer: Secondary | ICD-10-CM | POA: Diagnosis not present

## 2015-05-24 LAB — GLUCOSE, CAPILLARY
Glucose-Capillary: 133 mg/dL — ABNORMAL HIGH (ref 65–99)
Glucose-Capillary: 108 mg/dL — ABNORMAL HIGH (ref 65–99)
Glucose-Capillary: 301 mg/dL — ABNORMAL HIGH (ref 65–99)
Glucose-Capillary: 263 mg/dL — ABNORMAL HIGH (ref 65–99)

## 2015-05-25 DIAGNOSIS — E11621 Type 2 diabetes mellitus with foot ulcer: Secondary | ICD-10-CM | POA: Diagnosis not present

## 2015-05-25 LAB — GLUCOSE, CAPILLARY
GLUCOSE-CAPILLARY: 314 mg/dL — AB (ref 65–99)
GLUCOSE-CAPILLARY: 323 mg/dL — AB (ref 65–99)

## 2015-05-25 NOTE — Addendum Note (Signed)
Addended by: Denman George B on: 05/25/2015 05:15 PM   Modules accepted: Orders

## 2015-05-25 NOTE — Telephone Encounter (Signed)
Labs ordered for next visit and mailed to patient

## 2015-05-26 DIAGNOSIS — E11621 Type 2 diabetes mellitus with foot ulcer: Secondary | ICD-10-CM | POA: Diagnosis not present

## 2015-05-27 ENCOUNTER — Ambulatory Visit: Payer: Commercial Managed Care - HMO | Admitting: Family Medicine

## 2015-05-27 DIAGNOSIS — E11621 Type 2 diabetes mellitus with foot ulcer: Secondary | ICD-10-CM | POA: Diagnosis not present

## 2015-05-27 LAB — GLUCOSE, CAPILLARY
GLUCOSE-CAPILLARY: 129 mg/dL — AB (ref 65–99)
GLUCOSE-CAPILLARY: 266 mg/dL — AB (ref 65–99)
Glucose-Capillary: 176 mg/dL — ABNORMAL HIGH (ref 65–99)
Glucose-Capillary: 285 mg/dL — ABNORMAL HIGH (ref 65–99)
Glucose-Capillary: 189 mg/dL — ABNORMAL HIGH (ref 65–99)

## 2015-05-28 DIAGNOSIS — E11621 Type 2 diabetes mellitus with foot ulcer: Secondary | ICD-10-CM | POA: Diagnosis not present

## 2015-05-31 DIAGNOSIS — E11621 Type 2 diabetes mellitus with foot ulcer: Secondary | ICD-10-CM | POA: Diagnosis not present

## 2015-05-31 LAB — GLUCOSE, CAPILLARY
GLUCOSE-CAPILLARY: 190 mg/dL — AB (ref 65–99)
GLUCOSE-CAPILLARY: 147 mg/dL — AB (ref 65–99)
Glucose-Capillary: 153 mg/dL — ABNORMAL HIGH (ref 65–99)
Glucose-Capillary: 113 mg/dL — ABNORMAL HIGH (ref 65–99)

## 2015-06-01 DIAGNOSIS — E11621 Type 2 diabetes mellitus with foot ulcer: Secondary | ICD-10-CM | POA: Diagnosis not present

## 2015-06-01 LAB — GLUCOSE, CAPILLARY
GLUCOSE-CAPILLARY: 130 mg/dL — AB (ref 65–99)
Glucose-Capillary: 153 mg/dL — ABNORMAL HIGH (ref 65–99)

## 2015-06-02 DIAGNOSIS — E11621 Type 2 diabetes mellitus with foot ulcer: Secondary | ICD-10-CM | POA: Diagnosis not present

## 2015-06-02 LAB — GLUCOSE, CAPILLARY
GLUCOSE-CAPILLARY: 152 mg/dL — AB (ref 65–99)
Glucose-Capillary: 132 mg/dL — ABNORMAL HIGH (ref 65–99)

## 2015-06-03 DIAGNOSIS — E11621 Type 2 diabetes mellitus with foot ulcer: Secondary | ICD-10-CM | POA: Diagnosis not present

## 2015-06-03 LAB — GLUCOSE, CAPILLARY
GLUCOSE-CAPILLARY: 211 mg/dL — AB (ref 65–99)
GLUCOSE-CAPILLARY: 162 mg/dL — AB (ref 65–99)

## 2015-06-04 DIAGNOSIS — E11621 Type 2 diabetes mellitus with foot ulcer: Secondary | ICD-10-CM | POA: Diagnosis not present

## 2015-06-04 LAB — GLUCOSE, CAPILLARY
Glucose-Capillary: 165 mg/dL — ABNORMAL HIGH (ref 65–99)
Glucose-Capillary: 115 mg/dL — ABNORMAL HIGH (ref 65–99)

## 2015-06-07 ENCOUNTER — Encounter (HOSPITAL_BASED_OUTPATIENT_CLINIC_OR_DEPARTMENT_OTHER): Payer: Commercial Managed Care - HMO | Attending: Internal Medicine

## 2015-06-07 DIAGNOSIS — L97514 Non-pressure chronic ulcer of other part of right foot with necrosis of bone: Secondary | ICD-10-CM | POA: Insufficient documentation

## 2015-06-07 DIAGNOSIS — M86471 Chronic osteomyelitis with draining sinus, right ankle and foot: Secondary | ICD-10-CM | POA: Insufficient documentation

## 2015-06-07 DIAGNOSIS — E11621 Type 2 diabetes mellitus with foot ulcer: Secondary | ICD-10-CM | POA: Insufficient documentation

## 2015-06-07 LAB — GLUCOSE, CAPILLARY
GLUCOSE-CAPILLARY: 132 mg/dL — AB (ref 65–99)
GLUCOSE-CAPILLARY: 94 mg/dL (ref 65–99)

## 2015-06-22 ENCOUNTER — Encounter: Payer: Self-pay | Admitting: Podiatry

## 2015-06-23 ENCOUNTER — Other Ambulatory Visit (HOSPITAL_COMMUNITY)
Admission: RE | Admit: 2015-06-23 | Discharge: 2015-06-23 | Disposition: A | Discharge status: Home / Self Care | Payer: Commercial Managed Care - HMO | Source: Ambulatory Visit | Attending: Family Medicine | Admitting: Family Medicine

## 2015-06-23 DIAGNOSIS — E11311 Type 2 diabetes mellitus with unspecified diabetic retinopathy with macular edema: Secondary | ICD-10-CM | POA: Diagnosis present

## 2015-06-23 DIAGNOSIS — E1165 Type 2 diabetes mellitus with hyperglycemia: Secondary | ICD-10-CM | POA: Insufficient documentation

## 2015-06-23 DIAGNOSIS — Z1159 Encounter for screening for other viral diseases: Secondary | ICD-10-CM | POA: Insufficient documentation

## 2015-06-23 DIAGNOSIS — Z114 Encounter for screening for human immunodeficiency virus [HIV]: Secondary | ICD-10-CM | POA: Diagnosis present

## 2015-06-23 LAB — COMPREHENSIVE METABOLIC PANEL
ALK PHOS: 45 U/L (ref 38–126)
ALT: 9 U/L — AB (ref 14–54)
ANION GAP: 6 (ref 5–15)
AST: 14 U/L — ABNORMAL LOW (ref 15–41)
Albumin: 3.5 g/dL (ref 3.5–5.0)
BUN: 11 mg/dL (ref 6–20)
CALCIUM: 8.6 mg/dL — AB (ref 8.9–10.3)
CO2: 25 mmol/L (ref 22–32)
CREATININE: 0.7 mg/dL (ref 0.44–1.00)
Chloride: 106 mmol/L (ref 101–111)
Glucose, Bld: 161 mg/dL — ABNORMAL HIGH (ref 65–99)
Potassium: 3.8 mmol/L (ref 3.5–5.1)
SODIUM: 137 mmol/L (ref 135–145)
Total Bilirubin: 0.4 mg/dL (ref 0.3–1.2)
Total Protein: 7.5 g/dL (ref 6.5–8.1)

## 2015-06-24 ENCOUNTER — Ambulatory Visit (INDEPENDENT_AMBULATORY_CARE_PROVIDER_SITE_OTHER): Payer: Commercial Managed Care - HMO | Admitting: Family Medicine

## 2015-06-24 ENCOUNTER — Encounter: Payer: Self-pay | Admitting: Family Medicine

## 2015-06-24 VITALS — BP 142/88 | HR 98 | Resp 16 | Ht 64.0 in | Wt 185.0 lb

## 2015-06-24 DIAGNOSIS — D539 Nutritional anemia, unspecified: Secondary | ICD-10-CM | POA: Diagnosis not present

## 2015-06-24 DIAGNOSIS — E1169 Type 2 diabetes mellitus with other specified complication: Secondary | ICD-10-CM | POA: Insufficient documentation

## 2015-06-24 DIAGNOSIS — E11311 Type 2 diabetes mellitus with unspecified diabetic retinopathy with macular edema: Secondary | ICD-10-CM | POA: Diagnosis not present

## 2015-06-24 DIAGNOSIS — L97519 Non-pressure chronic ulcer of other part of right foot with unspecified severity: Secondary | ICD-10-CM

## 2015-06-24 DIAGNOSIS — E663 Overweight: Secondary | ICD-10-CM

## 2015-06-24 DIAGNOSIS — E785 Hyperlipidemia, unspecified: Secondary | ICD-10-CM

## 2015-06-24 DIAGNOSIS — E1165 Type 2 diabetes mellitus with hyperglycemia: Secondary | ICD-10-CM

## 2015-06-24 DIAGNOSIS — E669 Obesity, unspecified: Secondary | ICD-10-CM

## 2015-06-24 DIAGNOSIS — E119 Type 2 diabetes mellitus without complications: Secondary | ICD-10-CM | POA: Diagnosis not present

## 2015-06-24 DIAGNOSIS — Z23 Encounter for immunization: Secondary | ICD-10-CM

## 2015-06-24 LAB — HIV ANTIBODY (ROUTINE TESTING W REFLEX): HIV Screen 4th Generation wRfx: NONREACTIVE

## 2015-06-24 LAB — HEMOGLOBIN A1C
HEMOGLOBIN A1C: 7.6 % — AB (ref 4.8–5.6)
Mean Plasma Glucose: 171 mg/dL

## 2015-06-24 LAB — HEPATITIS C ANTIBODY: HCV Ab: <0.1 s/co ratio (ref 0.0–0.9)

## 2015-06-24 MED ORDER — METFORMIN HCL 500 MG PO TABS: ORAL_TABLET | ORAL | Status: DC

## 2015-06-24 NOTE — Assessment & Plan Note (Signed)
Hyperlipidemia:Low fat diet discussed and encouraged.   Lipid Panel  Lab Results  Component Value Date   CHOL 178 03/18/2015   HDL 36* 03/18/2015   LDLCALC 78 03/18/2015   TRIG 322* 03/18/2015   CHOLHDL 4.9 03/18/2015      Updated lab needed

## 2015-06-24 NOTE — Assessment & Plan Note (Signed)
Improvement Increase metformin to 3 times daily. No new rash or outbreak as she has ahd in the past, hence metformin removed as erroneous entry as far as pt being allergic to the drug Pt reports using lantus infrequently, states marked improvement in blood sugar once infection was treated Caitlin Thompson is reminded of the importance of commitment to daily physical activity for 30 minutes or more, as able and the need to limit carbohydrate intake to 30 to 60 grams per meal to help with blood sugar control.   The need to take medication as prescribed, test blood sugar as directed, and to call between visits if there is a concern that blood sugar is uncontrolled is also discussed.   Caitlin Thompson is reminded of the importance of daily foot exam, annual eye examination, and good blood sugar, blood pressure and cholesterol control.  Diabetic Labs Latest Ref Rng 06/23/2015 03/18/2015 10/15/2014 07/07/2014 03/30/2014  HbA1c 4.8 - 5.6 % 7.6(H) 13.0(H) 10.2(H) 10.0(H) 8.9(H)  Microalbumin <2.0 mg/dL - - - - 2.1(H)  Micro/Creat Ratio 0.0 - 30.0 mg/g - - - - 17.2  Chol 0 - 200 mg/dL - 178 224(H) - 207(H)  HDL >40 mg/dL - 36(L) 65 - 66  Calc LDL 0 - 99 mg/dL - 78 134(H) - 117(H)  Triglycerides <150 mg/dL - 322(H) 126 - 118  Creatinine 0.44 - 1.00 mg/dL 0.70 1.10(H) 0.71 0.77 0.69   BP/Weight 06/24/2015 05/04/2015 03/23/2015 03/18/2015 07/07/2014 04/01/2014 [id number]  Systolic BP A999333 [id number] A999333 [id number]  Diastolic BP 88 95 91 84 82 84 84  Wt. (Lbs) 185 179 165 173 171.12 175.4 168.12  BMI 31.74 31.21 28.77 29.68 29.83 30.58 29.31   Foot/eye exam completion dates Latest Ref Rng 07/07/2014 12/29/2013  Eye Exam No Retinopathy - Retinopathy(A)  Foot exam Order - - -  Foot Form Completion - Done -

## 2015-06-24 NOTE — Patient Instructions (Signed)
Physical exam in end August, call if you need me sooner  Fasting labs for next  Visit  INCRASE metformin to one tablet 3 times daily if you can tolerate  CONGRATS!!  microalb and Pneumonia 23 today  PLS sched your eye exam  Thank you  for choosing [facility]. We consider it a privelige to serve you.  Delivering excellent health care in a caring and  compassionate way is our goal.  Partnering with you,  so that together we can achieve this goal is our strategy.

## 2015-06-24 NOTE — Assessment & Plan Note (Signed)
Completely healed in 06/2015

## 2015-06-24 NOTE — Progress Notes (Signed)
Subjective:    Patient ID: Caitlin Thompson, female    DOB: 1960-10-29, 55 y.o.   MRN: [id number]  HPI   Caitlin Thompson     MRN: [id number]      DOB: 1960/09/25   HPI Caitlin Thompson is here for follow up and re-evaluation of chronic medical conditions, medication management and review of any available recent lab and radiology data.  Preventive health is updated, specifically  Cancer screening and Immunization.   Questions or concerns regarding consultations or procedures which the PT has had in the interim are  addressed. The PT denies any adverse reactions to current medications since the last visit.  There are no new concerns.  There are no specific complaints   ROS Denies recent fever or chills. Denies sinus pressure, nasal congestion, ear pain or sore throat. Denies chest congestion, productive cough or wheezing. Denies chest pains, palpitations and leg swelling Denies abdominal pain, nausea, vomiting,diarrhea or constipation.   Denies dysuria, frequency, hesitancy or incontinence. Denies joint pain, swelling and limitation in mobility. Denies headaches, seizures, numbness, or tingling. Denies depression, anxiety or insomnia. Denies skin break down or rash.   PE  BP 142/88 mmHg  Pulse 98  Resp 16  Ht 5\' 4"  (1.626 m)  Wt 185 lb (83.915 kg)  BMI 31.74 kg/m2  SpO2 99%  Patient alert and oriented and in no cardiopulmonary distress.  HEENT: No facial asymmetry, EOMI,   oropharynx pink and moist.  Neck supple no JVD, no mass.  Chest: Clear to auscultation bilaterally.  CVS: S1, S2 no murmurs, no S3.Regular rate.  ABD: Soft non tender.   Ext: No edema  MS: Adequate ROM spine, shoulders, hips and knees.  Skin: Intact, no ulcerations  Noted.Has hyperpigmented macular rash on lower extremities  Psych: Good eye contact, normal affect. Memory intact not anxious or depressed appearing.  CNS: CN 2-12 intact, power,  normal throughout.no focal deficits  noted.   Assessment & Plan   Diabetes mellitus type 2 in obese (HCC) Improvement Increase metformin to 3 times daily. No new rash or outbreak as she has ahd in the past, hence metformin removed as erroneous entry as far as pt being allergic to the drug Pt reports using lantus infrequently, states marked improvement in blood sugar once infection was treated Caitlin Thompson is reminded of the importance of commitment to daily physical activity for 30 minutes or more, as able and the need to limit carbohydrate intake to 30 to 60 grams per meal to help with blood sugar control.   The need to take medication as prescribed, test blood sugar as directed, and to call between visits if there is a concern that blood sugar is uncontrolled is also discussed.   Caitlin Thompson is reminded of the importance of daily foot exam, annual eye examination, and good blood sugar, blood pressure and cholesterol control.  Diabetic Labs Latest Ref Rng 06/23/2015 03/18/2015 10/15/2014 07/07/2014 03/30/2014  HbA1c 4.8 - 5.6 % 7.6(H) 13.0(H) 10.2(H) 10.0(H) 8.9(H)  Microalbumin <2.0 mg/dL - - - - 2.1(H)  Micro/Creat Ratio 0.0 - 30.0 mg/g - - - - 17.2  Chol 0 - 200 mg/dL - 178 224(H) - 207(H)  HDL >40 mg/dL - 36(L) 65 - 66  Calc LDL 0 - 99 mg/dL - 78 134(H) - 117(H)  Triglycerides <150 mg/dL - 322(H) 126 - 118  Creatinine 0.44 - 1.00 mg/dL 0.70 1.10(H) 0.71 0.77 0.69   BP/Weight 06/24/2015 05/04/2015 03/23/2015 03/18/2015 07/07/2014 04/01/2014 [id number]  Systolic  BP 142 149 124 130 [id number]  Diastolic BP 88 95 91 84 82 84 84  Wt. (Lbs) 185 179 165 173 171.12 175.4 168.12  BMI 31.74 31.21 28.77 29.68 29.83 30.58 29.31   Foot/eye exam completion dates Latest Ref Rng 07/07/2014 12/29/2013  Eye Exam No Retinopathy - Retinopathy(A)  Foot exam Order - - -  Foot Form Completion - Done -         Overweight Deteriorated. Patient re-educated about  the importance of commitment to a  minimum of 150 minutes of exercise per week.   The importance of healthy food choices with portion control discussed. Encouraged to start a food diary, count calories and to consider  joining a support group. Sample diet sheets offered. Goals set by the patient for the next several months.   Weight /BMI 06/24/2015 05/04/2015 03/23/2015  WEIGHT 185 lb 179 lb 165 lb  HEIGHT 5\' 4"  - 5' 3.5"  BMI 31.74 kg/m2 31.21 kg/m2 28.77 kg/m2    Current exercise per week 90 mins, limited due to recovering toe infection, recently recovered   Hyperlipidemia LDL goal <100 Hyperlipidemia:Low fat diet discussed and encouraged.   Lipid Panel  Lab Results  Component Value Date   CHOL 178 03/18/2015   HDL 36* 03/18/2015   LDLCALC 78 03/18/2015   TRIG 322* 03/18/2015   CHOLHDL 4.9 03/18/2015      Updated lab needed    Toe ulcer, right ([place]) Completely healed in 06/2015       Review of Systems     Objective:   Physical Exam        Assessment & Plan:

## 2015-06-24 NOTE — Assessment & Plan Note (Signed)
Deteriorated. Patient re-educated about  the importance of commitment to a  minimum of 150 minutes of exercise per week.  The importance of healthy food choices with portion control discussed. Encouraged to start a food diary, count calories and to consider  joining a support group. Sample diet sheets offered. Goals set by the patient for the next several months.   Weight /BMI 06/24/2015 05/04/2015 03/23/2015  WEIGHT 185 lb 179 lb 165 lb  HEIGHT 5\' 4"  - 5' 3.5"  BMI 31.74 kg/m2 31.21 kg/m2 28.77 kg/m2    Current exercise per week 90 mins, limited due to recovering toe infection, recently recovered

## 2015-06-25 LAB — MICROALBUMIN / CREATININE URINE RATIO
Creatinine, Urine: 161 mg/dL (ref 20–320)
MICROALB UR: 10.8 mg/dL
MICROALB/CREAT RATIO: 67 ug/mg{creat} — AB (ref <30)

## 2015-10-12 ENCOUNTER — Telehealth: Payer: Self-pay | Admitting: Family Medicine

## 2015-10-12 NOTE — Telephone Encounter (Signed)
Patient is having sinus issues, she doesn't know if she needs to take something otc or needs to be seen. A lot of drainage making her sick on her stomach, bad headaches, causing and blood pressure to go out of wack. Poss low grade fever, please advise?

## 2015-10-13 MED ORDER — LEVOFLOXACIN 500 MG PO TABS: 500.0000 mg | ORAL_TABLET | Freq: Every day | ORAL | 0 refills | Status: DC

## 2015-10-13 NOTE — Telephone Encounter (Signed)
Spoke with patient and she states that she cannot afford to come in for 2 visits this month.  States that she has facial pain and sinus drainage x 2 weeks.  Please advise.

## 2015-10-13 NOTE — Telephone Encounter (Signed)
Has ahd levaquin in the past and not noted as an allergy, pls send levaquin 500 mg one daily for 1 week only NEEDS to get labs prior to her appt , 3 to 5 days and ALSO keep appt, explain if not responding to meds needs clinical eval , also that this is not the std protocol of treating with antibiotics without oV, will let this ONE TIME pass

## 2015-10-13 NOTE — Telephone Encounter (Signed)
Patient aware.   Appreciative for medication.  Will have labs done before visit.  Is requesting to have done at [facility].   Order mailed to address on file.

## 2015-10-25 ENCOUNTER — Telehealth: Payer: Self-pay

## 2015-10-25 ENCOUNTER — Other Ambulatory Visit (HOSPITAL_COMMUNITY)
Admission: RE | Admit: 2015-10-25 | Discharge: 2015-10-25 | Disposition: A | Discharge status: Home / Self Care | Payer: Commercial Managed Care - HMO | Source: Ambulatory Visit | Attending: Family Medicine | Admitting: Family Medicine

## 2015-10-25 DIAGNOSIS — E1165 Type 2 diabetes mellitus with hyperglycemia: Secondary | ICD-10-CM | POA: Insufficient documentation

## 2015-10-25 DIAGNOSIS — E785 Hyperlipidemia, unspecified: Secondary | ICD-10-CM

## 2015-10-25 DIAGNOSIS — E11311 Type 2 diabetes mellitus with unspecified diabetic retinopathy with macular edema: Secondary | ICD-10-CM

## 2015-10-25 LAB — LIPID PANEL
CHOLESTEROL: 210 mg/dL — AB (ref 0–200)
HDL: 71 mg/dL (ref >40)
LDL Cholesterol: 114 mg/dL — ABNORMAL HIGH (ref 0–99)
TRIGLYCERIDES: 127 mg/dL (ref <150)
Total CHOL/HDL Ratio: 3 RATIO
VLDL: 25 mg/dL (ref 0–40)

## 2015-10-25 LAB — CBC
HCT: 37.7 % (ref 36.0–46.0)
HEMOGLOBIN: 12.6 g/dL (ref 12.0–15.0)
MCH: 31.7 pg (ref 26.0–34.0)
MCHC: 33.4 g/dL (ref 30.0–36.0)
MCV: 95 fL (ref 78.0–100.0)
Platelets: 322 10*3/uL (ref 150–400)
RBC: 3.97 MIL/uL (ref 3.87–5.11)
RDW: 12.2 % (ref 11.5–15.5)
WBC: 9.7 10*3/uL (ref 4.0–10.5)

## 2015-10-25 LAB — COMPREHENSIVE METABOLIC PANEL
ALBUMIN: 3.8 g/dL (ref 3.5–5.0)
ALK PHOS: 55 U/L (ref 38–126)
ALT: 13 U/L — AB (ref 14–54)
AST: 22 U/L (ref 15–41)
Anion gap: 9 (ref 5–15)
BILIRUBIN TOTAL: 0.5 mg/dL (ref 0.3–1.2)
BUN: 12 mg/dL (ref 6–20)
CALCIUM: 9 mg/dL (ref 8.9–10.3)
CO2: 23 mmol/L (ref 22–32)
Chloride: 105 mmol/L (ref 101–111)
Creatinine, Ser: 0.73 mg/dL (ref 0.44–1.00)
GFR calc Af Amer: >60 mL/min (ref >60)
GLUCOSE: 182 mg/dL — AB (ref 65–99)
Potassium: 3.7 mmol/L (ref 3.5–5.1)
Sodium: 137 mmol/L (ref 135–145)
TOTAL PROTEIN: 8.1 g/dL (ref 6.5–8.1)

## 2015-10-25 NOTE — Telephone Encounter (Signed)
Orders entered for labs prior to next visit.   Initially mailed to home address on hospital lab order.  Patient did not receive in mail.

## 2015-10-26 LAB — HEMOGLOBIN A1C
HEMOGLOBIN A1C: 7.8 % — AB (ref 4.8–5.6)
Mean Plasma Glucose: 177 mg/dL

## 2015-10-27 ENCOUNTER — Ambulatory Visit (INDEPENDENT_AMBULATORY_CARE_PROVIDER_SITE_OTHER): Payer: Commercial Managed Care - HMO | Admitting: Family Medicine

## 2015-10-27 VITALS — BP 158/90 | HR 98 | Resp 14 | Ht 63.5 in | Wt 184.0 lb

## 2015-10-27 DIAGNOSIS — E669 Obesity, unspecified: Secondary | ICD-10-CM

## 2015-10-27 DIAGNOSIS — E1169 Type 2 diabetes mellitus with other specified complication: Secondary | ICD-10-CM

## 2015-10-27 DIAGNOSIS — I10 Essential (primary) hypertension: Secondary | ICD-10-CM | POA: Diagnosis not present

## 2015-10-27 DIAGNOSIS — E1165 Type 2 diabetes mellitus with hyperglycemia: Secondary | ICD-10-CM

## 2015-10-27 DIAGNOSIS — E785 Hyperlipidemia, unspecified: Secondary | ICD-10-CM

## 2015-10-27 DIAGNOSIS — E119 Type 2 diabetes mellitus without complications: Secondary | ICD-10-CM | POA: Diagnosis not present

## 2015-10-27 DIAGNOSIS — Z23 Encounter for immunization: Secondary | ICD-10-CM | POA: Diagnosis not present

## 2015-10-27 DIAGNOSIS — E11311 Type 2 diabetes mellitus with unspecified diabetic retinopathy with macular edema: Secondary | ICD-10-CM

## 2015-10-27 DIAGNOSIS — E663 Overweight: Secondary | ICD-10-CM

## 2015-10-27 DIAGNOSIS — R8290 Unspecified abnormal findings in urine: Secondary | ICD-10-CM

## 2015-10-27 LAB — POCT URINALYSIS DIPSTICK
Bilirubin, UA: NEGATIVE
GLUCOSE UA: NEGATIVE
Ketones, UA: NEGATIVE
LEUKOCYTES UA: NEGATIVE
NITRITE UA: NEGATIVE
RBC UA: NEGATIVE
Spec Grav, UA: 1.015
UROBILINOGEN UA: 0.2
pH, UA: 5

## 2015-10-27 MED ORDER — AMLODIPINE BESYLATE 5 MG PO TABS: 5.0000 mg | ORAL_TABLET | Freq: Every day | ORAL | 5 refills | Status: DC

## 2015-10-27 NOTE — Assessment & Plan Note (Signed)
Deteriorated Caitlin Thompson is reminded of the importance of commitment to daily physical activity for 30 minutes or more, as able and the need to limit carbohydrate intake to 30 to 60 grams per meal to help with blood sugar control.   The need to take medication as prescribed, test blood sugar as directed, and to call between visits if there is a concern that blood sugar is uncontrolled is also discussed.   Caitlin Thompson is reminded of the importance of daily foot exam, annual eye examination, and good blood sugar, blood pressure and cholesterol control.  Diabetic Labs Latest Ref Rng & Units 10/25/2015 06/24/2015 06/23/2015 03/18/2015 10/15/2014  HbA1c 4.8 - 5.6 % 7.8(H) - 7.6(H) 13.0(H) 10.2(H)  Microalbumin Not estab mg/dL - 10.8 - - -  Micro/Creat Ratio <30 mcg/mg creat - 67(H) - - -  Chol 0 - 200 mg/dL 210(H) - - 178 224(H)  HDL >40 mg/dL 71 - - 36(L) 65  Calc LDL 0 - 99 mg/dL 114(H) - - 78 134(H)  Triglycerides <150 mg/dL 127 - - 322(H) 126  Creatinine 0.44 - 1.00 mg/dL 0.73 - 0.70 1.10(H) 0.71   BP/Weight 10/27/2015 06/24/2015 05/04/2015 03/23/2015 03/18/2015 07/07/2014 [id number]  Systolic BP 0000000 A999333 123456 A999333 [id number] [id number]  Diastolic BP 90 88 95 91 84 82 84  Wt. (Lbs) 184 185 179 165 173 171.12 175.4  BMI 32.08 31.74 31.21 28.77 29.68 29.83 30.58   Foot/eye exam completion dates Latest Ref Rng & Units 07/07/2014 12/29/2013  Eye Exam No Retinopathy - Retinopathy(A)  Foot exam Order - - -  Foot Form Completion - Done -      Updated lab needed at/ before next visit.

## 2015-10-27 NOTE — Patient Instructions (Addendum)
Annual physical exam in January, call if you need me before  Flu vaccine today  PLEASE drink oNLY water, this will improve blood sugar  Cut back on eggs , to further improve cholesterol   New additional medication for blood pressure is amlodipine , take one daily  Fasting labs 1 week before next visit Thank you  for choosing [facility]. We consider it a privelige to serve you.  Delivering excellent health care in a caring and  compassionate way is our goal.  Partnering with you,  so that together we can achieve this goal is our strategy.  Urine shows no infection, drink more water and less soda

## 2015-10-27 NOTE — Assessment & Plan Note (Signed)
Uncontrolled DASH diet and commitment to daily physical activity for a minimum of 30 minutes discussed and encouraged, as a part of hypertension management. The importance of attaining a healthy weight is also discussed.  BP/Weight 10/27/2015 06/24/2015 05/04/2015 03/23/2015 03/18/2015 07/07/2014 [id number]  Systolic BP 0000000 A999333 123456 A999333 [id number] [id number] XX123456  Diastolic BP 90 88 95 91 84 82 84  Wt. (Lbs) 184 185 179 165 173 171.12 175.4  BMI 32.08 31.74 31.21 28.77 29.68 29.83 30.58

## 2015-10-27 NOTE — Assessment & Plan Note (Addendum)
Improved, continue low fat diet  Hyperlipidemia:Low fat diet discussed and encouraged.   Lipid Panel  Lab Results  Component Value Date   CHOL 210 (H) 10/25/2015   HDL 71 10/25/2015   LDLCALC 114 (H) 10/25/2015   TRIG 127 10/25/2015   CHOLHDL 3.0 10/25/2015

## 2015-10-31 ENCOUNTER — Encounter: Payer: Self-pay | Admitting: Family Medicine

## 2015-10-31 DIAGNOSIS — R8290 Unspecified abnormal findings in urine: Secondary | ICD-10-CM | POA: Insufficient documentation

## 2015-10-31 NOTE — Progress Notes (Signed)
Caitlin Thompson     MRN: [id number]      DOB: 1960-10-16   HPI Caitlin Thompson, medication management and review of any available recent lab and radiology data.  Preventive health is updated, specifically  Cancer screening and Immunization.   Questions or concerns regarding consultations or procedures which the PT has had in the interim are  addressed. The PT denies any adverse reactions to current medications since the last visit.  Denies polyuria, polydipsia, blurred vision , or hypoglycemic episodes. C/o malodorous urine x 1 week, no fever or flank pain C/o intermittent headaches , primarily frontal pressure associated with increased allergy symptoms, no localized weakness or numbness, no blurred vision  ROS Denies recent fever or chills. Denies sinus pressure, nasal congestion, ear pain or sore throat. Denies chest congestion, productive cough or wheezing. Denies chest pains, palpitations and leg swelling Denies abdominal pain, nausea, vomiting,diarrhea or constipation.   Denies dysuria, frequency, hesitancy or incontinence. Denies joint pain, swelling and limitation in mobility. Denies  seizures, numbness, or tingling. Denies depression, anxiety or insomnia. Denies skin break down or rash.   PE  BP (!) 158/90   Pulse 98   Resp 14   Ht 5' 3.5" (1.613 m)   Wt 184 lb (83.5 kg)   SpO2 96%   BMI 32.08 kg/m   Patient alert and oriented and in no cardiopulmonary distress.  HEENT: No facial asymmetry, EOMI,   oropharynx pink and moist.  Neck supple no JVD, no mass.  Chest: Clear to auscultation bilaterally.  CVS: S1, S2 no murmurs, no S3.Regular rate.  ABD: Soft non tender.   Ext: No edema  MS: Adequate ROM spine, shoulders, hips and knees.  Skin: Intact, no ulcerations or rash noted.  Psych: Good eye contact, normal affect. Memory intact not anxious or depressed appearing.  CNS: CN 2-12 intact,  power,  normal throughout.no focal deficits noted.   Assessment & Plan  Diabetes mellitus type 2 in obese [facility]) Deteriorated Caitlin Thompson is reminded of the importance of commitment to daily physical activity for 30 minutes or more, as able and the need to limit carbohydrate intake to 30 to 60 grams per meal to help with blood sugar control.   The need to take medication as prescribed, test blood sugar as directed, and to call between visits if there is a concern that blood sugar is uncontrolled is also discussed.   Caitlin Thompson is reminded of the importance of daily foot exam, annual eye examination, and good blood sugar, blood pressure and cholesterol control.  Diabetic Labs Latest Ref Rng & Units 10/25/2015 06/24/2015 06/23/2015 03/18/2015 10/15/2014  HbA1c 4.8 - 5.6 % 7.8(H) - 7.6(H) 13.0(H) 10.2(H)  Microalbumin Not estab mg/dL - 10.8 - - -  Micro/Creat Ratio <30 mcg/mg creat - 67(H) - - -  Chol 0 - 200 mg/dL 210(H) - - 178 224(H)  HDL >40 mg/dL 71 - - 36(L) 65  Calc LDL 0 - 99 mg/dL 114(H) - - 78 134(H)  Triglycerides <150 mg/dL 127 - - 322(H) 126  Creatinine 0.44 - 1.00 mg/dL 0.73 - 0.70 1.10(H) 0.71   BP/Weight 10/27/2015 06/24/2015 05/04/2015 03/23/2015 03/18/2015 07/07/2014 [id number]  Systolic BP 0000000 A999333 123456 A999333 [id number] XX123456  Diastolic BP 90 88 95 91 84 82 84  Wt. (Lbs) 184 185 179 165 173 171.12 175.4  BMI 32.08 31.74 31.21 28.77 29.68 29.83 30.58   Foot/eye exam completion  dates Latest Ref Rng & Units 07/07/2014 12/29/2013  Eye Exam No Retinopathy - Retinopathy(A)  Foot exam Order - - -  Foot Form Completion - Done -      Updated lab needed at/ before next visit.   Hyperlipidemia LDL goal <100 Improved, continue low fat diet  Hyperlipidemia:Low fat diet discussed and encouraged.   Lipid Panel  Lab Results  Component Value Date   CHOL 210 (H) 10/25/2015   HDL 71 10/25/2015   LDLCALC 114 (H) 10/25/2015   TRIG 127 10/25/2015   CHOLHDL 3.0 10/25/2015       Essential  hypertension Uncontrolled DASH diet and commitment to daily physical activity for a minimum of 30 minutes discussed and encouraged, as a part of hypertension management. The importance of attaining a healthy weight is also discussed.  BP/Weight 10/27/2015 06/24/2015 05/04/2015 03/23/2015 03/18/2015 07/07/2014 [id number]  Systolic BP 0000000 A999333 123456 A999333 [id number] [id number] XX123456  Diastolic BP 90 88 95 91 84 82 84  Wt. (Lbs) 184 185 179 165 173 171.12 175.4  BMI 32.08 31.74 31.21 28.77 29.68 29.83 30.58       Overweight Improved Patient re-educated about  the importance of commitment to a  minimum of 150 minutes of exercise per week.  The importance of healthy food choices with portion control discussed. Encouraged to start a food diary, count calories and to consider  joining a support group. Sample diet sheets offered. Goals set by the patient for the next several months.   Weight /BMI 10/27/2015 06/24/2015 05/04/2015  WEIGHT 184 lb 185 lb 179 lb  HEIGHT 5' 3.5" 5\' 4"  -  BMI 32.08 kg/m2 31.74 kg/m2 31.21 kg/m2      Need for prophylactic vaccination and inoculation against influenza After obtaining informed consent, the vaccine is  administered by LPN.   Malodorous urine Urinalysis is negative for infection, pt advised to increase water intake

## 2015-10-31 NOTE — Assessment & Plan Note (Signed)
Improved Patient re-educated about  the importance of commitment to a  minimum of 150 minutes of exercise per week.  The importance of healthy food choices with portion control discussed. Encouraged to start a food diary, count calories and to consider  joining a support group. Sample diet sheets offered. Goals set by the patient for the next several months.   Weight /BMI 10/27/2015 06/24/2015 05/04/2015  WEIGHT 184 lb 185 lb 179 lb  HEIGHT 5' 3.5" 5\' 4"  -  BMI 32.08 kg/m2 31.74 kg/m2 31.21 kg/m2

## 2015-10-31 NOTE — Assessment & Plan Note (Signed)
Urinalysis is negative for infection, pt advised to increase water intake

## 2015-10-31 NOTE — Assessment & Plan Note (Signed)
After obtaining informed consent, the vaccine is  administered by LPN.  

## 2015-12-02 ENCOUNTER — Other Ambulatory Visit: Payer: Self-pay | Admitting: Family Medicine

## 2015-12-13 ENCOUNTER — Telehealth: Payer: Self-pay | Admitting: Family Medicine

## 2015-12-13 NOTE — Telephone Encounter (Signed)
Pls explain that since she is requesting work note due to back problems and disc disease (of which I can see ne clear record), I ecommend best to see ortho Doc to evaluate and provide required testing and documentation to justify this, I am unable to do this I am returning the form also

## 2016-01-01 NOTE — Telephone Encounter (Signed)
mychart message sent to patient

## 2016-02-09 ENCOUNTER — Other Ambulatory Visit: Payer: Self-pay | Admitting: Family Medicine

## 2016-03-29 ENCOUNTER — Encounter: Payer: Commercial Managed Care - HMO | Admitting: Family Medicine

## 2016-04-13 ENCOUNTER — Encounter: Payer: Self-pay | Admitting: Internal Medicine

## 2016-04-20 ENCOUNTER — Telehealth: Payer: Self-pay | Admitting: Family Medicine

## 2016-04-20 DIAGNOSIS — E785 Hyperlipidemia, unspecified: Secondary | ICD-10-CM

## 2016-04-20 DIAGNOSIS — I10 Essential (primary) hypertension: Secondary | ICD-10-CM

## 2016-04-20 DIAGNOSIS — E11311 Type 2 diabetes mellitus with unspecified diabetic retinopathy with macular edema: Secondary | ICD-10-CM

## 2016-04-20 DIAGNOSIS — E1165 Type 2 diabetes mellitus with hyperglycemia: Principal | ICD-10-CM

## 2016-04-20 NOTE — Telephone Encounter (Signed)
Please update [facility] lab order and it needs to go to [facility], thanks

## 2016-04-21 NOTE — Telephone Encounter (Signed)
Expired labs re ordered and faxed to [facility]

## 2016-05-04 ENCOUNTER — Telehealth: Payer: Self-pay | Admitting: Family Medicine

## 2016-05-04 NOTE — Telephone Encounter (Signed)
Caitlin Thompson is asking if there is another form that she needs to fill out for her Invokana or is there something similar on her drug formulary that would take its place she cant pay $140 for the Invokana, please advise?

## 2016-05-04 NOTE — Telephone Encounter (Signed)
Savings Card for [place] faxed to pharmacy and patient aware.

## 2016-05-18 ENCOUNTER — Other Ambulatory Visit: Payer: Self-pay | Admitting: Family Medicine

## 2016-05-18 MED ORDER — INSULIN DETEMIR 100 UNIT/ML FLEXPEN
PEN_INJECTOR | SUBCUTANEOUS | 3 refills | Status: DC
Start: 2016-05-18 — End: 2016-05-24

## 2016-05-19 ENCOUNTER — Other Ambulatory Visit (HOSPITAL_COMMUNITY)
Admission: RE | Admit: 2016-05-19 | Discharge: 2016-05-19 | Disposition: A | Discharge status: Home / Self Care | Payer: Commercial Managed Care - HMO | Source: Ambulatory Visit | Attending: Family Medicine | Admitting: Family Medicine

## 2016-05-19 DIAGNOSIS — I10 Essential (primary) hypertension: Secondary | ICD-10-CM | POA: Diagnosis present

## 2016-05-19 DIAGNOSIS — E785 Hyperlipidemia, unspecified: Secondary | ICD-10-CM | POA: Insufficient documentation

## 2016-05-19 LAB — COMPREHENSIVE METABOLIC PANEL
ALK PHOS: 65 U/L (ref 38–126)
ALT: 15 U/L (ref 14–54)
AST: 19 U/L (ref 15–41)
Albumin: 3.7 g/dL (ref 3.5–5.0)
Anion gap: 10 (ref 5–15)
BUN: 9 mg/dL (ref 6–20)
CALCIUM: 9.3 mg/dL (ref 8.9–10.3)
CO2: 25 mmol/L (ref 22–32)
CREATININE: 0.69 mg/dL (ref 0.44–1.00)
Chloride: 100 mmol/L — ABNORMAL LOW (ref 101–111)
Glucose, Bld: 311 mg/dL — ABNORMAL HIGH (ref 65–99)
Potassium: 3.7 mmol/L (ref 3.5–5.1)
Sodium: 135 mmol/L (ref 135–145)
Total Bilirubin: 0.4 mg/dL (ref 0.3–1.2)
Total Protein: 7.7 g/dL (ref 6.5–8.1)

## 2016-05-19 LAB — LIPID PANEL
CHOL/HDL RATIO: 3.8 ratio
CHOLESTEROL: 225 mg/dL — AB (ref 0–200)
HDL: 60 mg/dL (ref >40)
LDL Cholesterol: 122 mg/dL — ABNORMAL HIGH (ref 0–99)
TRIGLYCERIDES: 213 mg/dL — AB (ref <150)
VLDL: 43 mg/dL — ABNORMAL HIGH (ref 0–40)

## 2016-05-20 LAB — HEMOGLOBIN A1C
Hgb A1c MFr Bld: 11.2 % — ABNORMAL HIGH (ref 4.8–5.6)
MEAN PLASMA GLUCOSE: 275 mg/dL

## 2016-05-21 ENCOUNTER — Telehealth: Payer: Self-pay | Admitting: Family Medicine

## 2016-05-21 NOTE — Telephone Encounter (Signed)
Please contact this pt, her labs show MARKED uncontrol of her blood sugar , she NEEDS an appointment this week and NEEDS to bring in her medications, and test supplies, HBA1C is highest it has ever been , over 11 , any problems, questions or concerns , please let me know

## 2016-05-22 NOTE — Telephone Encounter (Signed)
Has an appt Wednesday

## 2016-05-24 ENCOUNTER — Ambulatory Visit (INDEPENDENT_AMBULATORY_CARE_PROVIDER_SITE_OTHER): Payer: Commercial Managed Care - HMO | Admitting: Family Medicine

## 2016-05-24 ENCOUNTER — Encounter: Payer: Self-pay | Admitting: Family Medicine

## 2016-05-24 VITALS — BP 140/82 | HR 104 | Resp 15 | Ht 63.0 in | Wt 176.4 lb

## 2016-05-24 DIAGNOSIS — E785 Hyperlipidemia, unspecified: Secondary | ICD-10-CM | POA: Diagnosis not present

## 2016-05-24 DIAGNOSIS — I10 Essential (primary) hypertension: Secondary | ICD-10-CM

## 2016-05-24 DIAGNOSIS — E1165 Type 2 diabetes mellitus with hyperglycemia: Secondary | ICD-10-CM

## 2016-05-24 DIAGNOSIS — D126 Benign neoplasm of colon, unspecified: Secondary | ICD-10-CM

## 2016-05-24 DIAGNOSIS — M4722 Other spondylosis with radiculopathy, cervical region: Secondary | ICD-10-CM

## 2016-05-24 DIAGNOSIS — E11311 Type 2 diabetes mellitus with unspecified diabetic retinopathy with macular edema: Secondary | ICD-10-CM | POA: Diagnosis not present

## 2016-05-24 DIAGNOSIS — N200 Calculus of kidney: Secondary | ICD-10-CM | POA: Diagnosis not present

## 2016-05-24 MED ORDER — METFORMIN HCL 500 MG PO TABS: ORAL_TABLET | ORAL | 3 refills | Status: DC

## 2016-05-24 MED ORDER — INSULIN GLARGINE 100 UNITS/ML SOLOSTAR PEN: 90.0000 [IU] | PEN_INJECTOR | Freq: Every day | SUBCUTANEOUS | 11 refills | Status: DC

## 2016-05-24 NOTE — Patient Instructions (Signed)
f/u in 3 month, call if you need me sooner  Start insulin at 35 units daily  Test and record at least twice daily Goal for fasting blood sugar ranges from 80 to 130 and 2 hours after any meal or at bedtime should be between 130 to 170.   Test fasting blood sugar every day, range is 90 to 130  Increase the insulin dose by 3 units every 3 days if your fasting sugar is staying above 130, so start at 35 units, inc to 38 units , then 41 units, then 44 units etc. CALL with questions  Reduce fat in diet   Non fast hBA1C chem 7 and eGFR in 3 month  Thank you  for choosing  Primary Care. We consider it a privelige to serve you.  Delivering excellent health care in a caring and  compassionate way is our goal.  Partnering with you,  so that together we can achieve this goal is our strategy.

## 2016-05-27 DIAGNOSIS — M47812 Spondylosis without myelopathy or radiculopathy, cervical region: Secondary | ICD-10-CM | POA: Insufficient documentation

## 2016-05-27 NOTE — Assessment & Plan Note (Signed)
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 c/o neck  And shoulder and upper extremity pain esp on the job , will again provide paper work  For my review with regard to request for special equipment

## 2016-05-27 NOTE — Assessment & Plan Note (Addendum)
Deteriorated and uncontrolled re educated re  NEED to stay on insulin, script for higher than expected dose written so that she can titrate up as needed and get some help with cost as this is a barrier Caitlin Thompson is reminded of the importance of commitment to daily physical activity for 30 minutes or more, as able and the need to limit carbohydrate intake to 30 to 60 grams per meal to help with blood sugar control.   The need to take medication as prescribed, test blood sugar as directed, and to call between visits if there is a concern that blood sugar is uncontrolled is also discussed.   Caitlin Thompson is reminded of the importance of daily foot exam, annual eye examination, and good blood sugar, blood pressure and cholesterol control.  Diabetic Labs Latest Ref Rng & Units 05/19/2016 10/25/2015 06/24/2015 06/23/2015 03/18/2015  HbA1c 4.8 - 5.6 % 11.2(H) 7.8(H) - 7.6(H) 13.0(H)  Microalbumin Not estab mg/dL - - 10.8 - -  Micro/Creat Ratio <30 mcg/mg creat - - 67(H) - -  Chol 0 - 200 mg/dL 225(H) 210(H) - - 178  HDL >40 mg/dL 60 71 - - 36(L)  Calc LDL 0 - 99 mg/dL 122(H) 114(H) - - 78  Triglycerides <150 mg/dL 213(H) 127 - - 322(H)  Creatinine 0.44 - 1.00 mg/dL 0.69 0.73 - 0.70 1.10(H)   BP/Weight 05/24/2016 10/27/2015 06/24/2015 05/04/2015 03/23/2015 6/38/1771 03/11/5788  Systolic BP 383 338 329 191 660 600 459  Diastolic BP 82 90 88 95 91 84 82  Wt. (Lbs) 176.4 184 185 179 165 173 171.12  BMI 31.25 32.08 31.74 31.21 28.77 29.68 29.83   Foot/eye exam completion dates Latest Ref Rng & Units 07/07/2014 12/29/2013  Eye Exam No Retinopathy - Retinopathy(A)  Foot exam Order - - -  Foot Form Completion - Done -   CALL with questions/ concerns Metformin dose will be titrated up as tolerated   Updated lab needed at/ before next visit.

## 2016-05-27 NOTE — Assessment & Plan Note (Signed)
Reminder letter sent this year to pt from GI, she needs to follow through

## 2016-05-27 NOTE — Assessment & Plan Note (Signed)
Denies recent flank pain or urinary symptoms

## 2016-05-27 NOTE — Assessment & Plan Note (Signed)
Uncontrolled , has sever drug allergy unknown cause opts for lifestyle change to address this Hyperlipidemia:Low fat diet discussed and encouraged.   Lipid Panel  Lab Results  Component Value Date   CHOL 225 (H) 05/19/2016   HDL 60 05/19/2016   LDLCALC 122 (H) 05/19/2016   TRIG 213 (H) 05/19/2016   CHOLHDL 3.8 05/19/2016

## 2016-05-27 NOTE — Assessment & Plan Note (Signed)
Not at goal, reports multiple severe drug allergies , unsure of drug, lifestyle management of hTN is her option DASH diet and commitment to daily physical activity for a minimum of 30 minutes discussed and encouraged, as a part of hypertension management. The importance of attaining a healthy weight is also discussed.  BP/Weight 05/24/2016 10/27/2015 06/24/2015 05/04/2015 03/23/2015 5/36/1443 03/10/4006  Systolic BP 676 195 093 267 124 580 998  Diastolic BP 82 90 88 95 91 84 82  Wt. (Lbs) 176.4 184 185 179 165 173 171.12  BMI 31.25 32.08 31.74 31.21 28.77 29.68 29.83

## 2016-05-27 NOTE — Progress Notes (Signed)
Caitlin Thompson     MRN: [id number]      DOB: [birth year]   HPI [name] is here for follow up and re-evaluation of chronic medical conditions, medication management and review of any available recent lab and radiology data.  Preventive health is updated, specifically  Cancer screening and Immunization.   Has been off insulin for over 2 months, and states she realized that metformin alone was not enough, but still did not call in c/o inability to get work note enabling her to get stand up chair on the job, has established cervical disc disease I advised her I will review paperwork once more, , in the past I advised her to see ortho about the paperwork and she states she got frustrated Reports blood sugar often over 250  ROS Denies recent fever or chills. Denies sinus pressure, nasal congestion, ear pain or sore throat. Denies chest congestion, productive cough or wheezing. Denies chest pains, palpitations and leg swelling Denies abdominal pain, nausea, vomiting,diarrhea or constipation.   Denies dysuria, frequency, hesitancy or incontinence.  Denies headaches, seizures, numbness, or tingling. Denies depression, anxiety or insomnia. Denies skin break down or rash.   PE  BP 140/82   Pulse (!) 104   Resp 15   Ht 5\' 3"  (1.6 m)   Wt 176 lb 6.4 oz (80 kg)   SpO2 96%   BMI 31.25 kg/m   Patient alert and oriented and in no cardiopulmonary distress.  HEENT: No facial asymmetry, EOMI,   oropharynx pink and moist.  Neck supple no JVD, no mass.  Chest: Clear to auscultation bilaterally.  CVS: S1, S2 no murmurs, no S3.Regular rate.  ABD: Soft non tender.   Ext: No edema  MS: Adequate ROM spine, shoulders, hips and knees.  Skin: Intact, no ulcerations or rash noted.  Psych: Good eye contact, normal affect. Memory intact not anxious or depressed appearing.  CNS: CN 2-12 intact, power,  normal throughout.no focal deficits noted.   Assessment & Plan Type 2 diabetes,  uncontrolled, with retinopathy Deteriorated and uncontrolled re educated re  NEED to stay on insulin, script for higher than expected dose written so that she can titrate up as needed and get some help with cost as this is a barrier Caitlin Thompson is reminded of the importance of commitment to daily physical activity for 30 minutes or more, as able and the need to limit carbohydrate intake to 30 to 60 grams per meal to help with blood sugar control.   The need to take medication as prescribed, test blood sugar as directed, and to call between visits if there is a concern that blood sugar is uncontrolled is also discussed.   Caitlin Thompson is reminded of the importance of daily foot exam, annual eye examination, and good blood sugar, blood pressure and cholesterol control.  Diabetic Labs Latest Ref Rng & Units 05/19/2016 10/25/2015 06/24/2015 06/23/2015 03/18/2015  HbA1c 4.8 - 5.6 % 11.2(H) 7.8(H) - 7.6(H) 13.0(H)  Microalbumin Not estab mg/dL - - 10.8 - -  Micro/Creat Ratio <30 mcg/mg creat - - 67(H) - -  Chol 0 - 200 mg/dL 225(H) 210(H) - - 178  HDL >40 mg/dL 60 71 - - 36(L)  Calc LDL 0 - 99 mg/dL 122(H) 114(H) - - 78  Triglycerides <150 mg/dL 213(H) 127 - - 322(H)  Creatinine 0.44 - 1.00 mg/dL 0.69 0.73 - 0.70 1.10(H)   BP/Weight 05/24/2016 10/27/2015 06/24/2015 05/04/2015 03/23/2015 6/33/3545 08/05/5636  Systolic BP 937 342 876 811  485 462 703  Diastolic BP 82 90 88 95 91 84 82  Wt. (Lbs) 176.4 184 185 179 165 173 171.12  BMI 31.25 32.08 31.74 31.21 28.77 29.68 29.83   Foot/eye exam completion dates Latest Ref Rng & Units 07/07/2014 12/29/2013  Eye Exam No Retinopathy - Retinopathy(A)  Foot exam Order - - -  Foot Form Completion - Done -   CALL with questions/ concerns Metformin dose will be titrated up as tolerated   Updated lab needed at/ before next visit.   Hyperlipidemia LDL goal <100 Uncontrolled , has sever drug allergy unknown cause opts for lifestyle change to address  this Hyperlipidemia:Low fat diet discussed and encouraged.   Lipid Panel  Lab Results  Component Value Date   CHOL 225 (H) 05/19/2016   HDL 60 05/19/2016   LDLCALC 122 (H) 05/19/2016   TRIG 213 (H) 05/19/2016   CHOLHDL 3.8 05/19/2016       Essential hypertension Not at goal, reports multiple severe drug allergies , unsure of drug, lifestyle management of hTN is her option DASH diet and commitment to daily physical activity for a minimum of 30 minutes discussed and encouraged, as a part of hypertension management. The importance of attaining a healthy weight is also discussed.  BP/Weight 05/24/2016 10/27/2015 06/24/2015 05/04/2015 03/23/2015 5/00/9381 10/06/9935  Systolic BP 169 678 938 101 751 025 852  Diastolic BP 82 90 88 95 91 84 82  Wt. (Lbs) 176.4 184 185 179 165 173 171.12  BMI 31.25 32.08 31.74 31.21 28.77 29.68 29.83       RENAL CALCULUS Denies recent flank pain or urinary symptoms  Adenomatous colon polyp Reminder letter sent this year to pt from GI, she needs to follow through  Spondylosis, cervical Established spondylosis and mild disc disease in C spine since 2013. c/o neck  And shoulder and upper extremity pain esp on the job , will again provide paper work  For my review with regard to request for special equipment

## 2016-06-12 ENCOUNTER — Other Ambulatory Visit: Payer: Self-pay | Admitting: Family Medicine

## 2016-06-12 ENCOUNTER — Telehealth: Payer: Self-pay

## 2016-06-12 MED ORDER — AZITHROMYCIN 250 MG PO TABS
ORAL_TABLET | ORAL | 0 refills | Status: DC
Start: 2016-06-12 — End: 2016-10-05

## 2016-06-12 NOTE — Telephone Encounter (Signed)
c/o sinus infection that won't go away. Pain and pressure in the head and bad headaches that she can't get rid of. Requesting something be called in to pharmacy

## 2016-06-12 NOTE — Telephone Encounter (Signed)
Z pack sent in attempted to notify pt, no answer, message left

## 2016-08-31 ENCOUNTER — Ambulatory Visit: Payer: Commercial Managed Care - HMO | Admitting: Family Medicine

## 2016-08-31 ENCOUNTER — Telehealth: Payer: Self-pay | Admitting: Family Medicine

## 2016-08-31 NOTE — Telephone Encounter (Signed)
Faxed to [facility]

## 2016-08-31 NOTE — Telephone Encounter (Signed)
Patient has appt in August 6th.  She wants to make sure that her lab orders are sent to [facility].    cb  [phone]

## 2016-09-27 ENCOUNTER — Other Ambulatory Visit: Payer: Self-pay | Admitting: Family Medicine

## 2016-09-27 ENCOUNTER — Other Ambulatory Visit (HOSPITAL_COMMUNITY)
Admission: RE | Admit: 2016-09-27 | Discharge: 2016-09-27 | Disposition: A | Discharge status: Home / Self Care | Payer: Commercial Managed Care - HMO | Source: Ambulatory Visit | Attending: Family Medicine | Admitting: Family Medicine

## 2016-09-27 DIAGNOSIS — Z1231 Encounter for screening mammogram for malignant neoplasm of breast: Secondary | ICD-10-CM

## 2016-09-27 DIAGNOSIS — E11311 Type 2 diabetes mellitus with unspecified diabetic retinopathy with macular edema: Secondary | ICD-10-CM | POA: Diagnosis not present

## 2016-09-27 DIAGNOSIS — E1165 Type 2 diabetes mellitus with hyperglycemia: Secondary | ICD-10-CM | POA: Diagnosis not present

## 2016-09-27 LAB — COMPREHENSIVE METABOLIC PANEL
ALT: 18 U/L (ref 14–54)
AST: 23 U/L (ref 15–41)
Albumin: 3.7 g/dL (ref 3.5–5.0)
Alkaline Phosphatase: 77 U/L (ref 38–126)
Anion gap: 12 (ref 5–15)
BILIRUBIN TOTAL: 1 mg/dL (ref 0.3–1.2)
BUN: 13 mg/dL (ref 6–20)
CHLORIDE: 99 mmol/L — AB (ref 101–111)
CO2: 22 mmol/L (ref 22–32)
CREATININE: 0.77 mg/dL (ref 0.44–1.00)
Calcium: 9.3 mg/dL (ref 8.9–10.3)
GFR calc Af Amer: >60 mL/min (ref >60)
Glucose, Bld: 441 mg/dL — ABNORMAL HIGH (ref 65–99)
Potassium: 4.1 mmol/L (ref 3.5–5.1)
Sodium: 133 mmol/L — ABNORMAL LOW (ref 135–145)
Total Protein: 8 g/dL (ref 6.5–8.1)

## 2016-09-28 LAB — HEMOGLOBIN A1C
HEMOGLOBIN A1C: 12.6 % — AB (ref 4.8–5.6)
Mean Plasma Glucose: 315 mg/dL

## 2016-10-04 ENCOUNTER — Ambulatory Visit (HOSPITAL_COMMUNITY)
Admission: RE | Admit: 2016-10-04 | Discharge: 2016-10-04 | Disposition: A | Discharge status: Home / Self Care | Payer: Commercial Managed Care - HMO | Source: Ambulatory Visit | Attending: Family Medicine | Admitting: Family Medicine

## 2016-10-04 ENCOUNTER — Encounter (HOSPITAL_COMMUNITY): Payer: Self-pay | Admitting: Radiology

## 2016-10-04 DIAGNOSIS — Z1231 Encounter for screening mammogram for malignant neoplasm of breast: Secondary | ICD-10-CM | POA: Diagnosis not present

## 2016-10-05 ENCOUNTER — Encounter: Payer: Self-pay | Admitting: Family Medicine

## 2016-10-05 ENCOUNTER — Ambulatory Visit (INDEPENDENT_AMBULATORY_CARE_PROVIDER_SITE_OTHER): Payer: Commercial Managed Care - HMO | Admitting: Family Medicine

## 2016-10-05 ENCOUNTER — Other Ambulatory Visit (HOSPITAL_COMMUNITY)
Admission: RE | Admit: 2016-10-05 | Discharge: 2016-10-05 | Disposition: A | Discharge status: Home / Self Care | Payer: Commercial Managed Care - HMO | Source: Other Acute Inpatient Hospital | Attending: Family Medicine | Admitting: Family Medicine

## 2016-10-05 VITALS — BP 158/82 | HR 109 | Resp 16 | Ht 63.0 in | Wt 175.0 lb

## 2016-10-05 DIAGNOSIS — E11311 Type 2 diabetes mellitus with unspecified diabetic retinopathy with macular edema: Secondary | ICD-10-CM | POA: Diagnosis not present

## 2016-10-05 DIAGNOSIS — E785 Hyperlipidemia, unspecified: Secondary | ICD-10-CM

## 2016-10-05 DIAGNOSIS — E1165 Type 2 diabetes mellitus with hyperglycemia: Secondary | ICD-10-CM

## 2016-10-05 DIAGNOSIS — I10 Essential (primary) hypertension: Secondary | ICD-10-CM

## 2016-10-05 MED ORDER — BASAGLAR KWIKPEN 100 UNIT/ML ~~LOC~~ SOPN: PEN_INJECTOR | SUBCUTANEOUS | 5 refills | Status: DC

## 2016-10-05 MED ORDER — AMLODIPINE BESYLATE 5 MG PO TABS: 5.0000 mg | ORAL_TABLET | Freq: Every day | ORAL | 5 refills | Status: DC

## 2016-10-05 NOTE — Patient Instructions (Signed)
F/u in 3 month, call if you  Need me sooner  New for your diabetes is basaglar, start at 50 units daily, and increase every 3 days by 3 units if fasting blood sugar average is over 130.script written for 70 units daily. Continue metformin as before  For blood pressure, amlodipine 5 mg one daily  Goal for fasting blood sugar ranges from 80 to 120 and 2 hours after any meal or at bedtime should be between 130 to 180.  Non fast hBA1c, chem 7 and eGFR 1 week before next visit  Microalb from office today

## 2016-10-06 LAB — MICROALBUMIN / CREATININE URINE RATIO
Creatinine, Urine: 49.8 mg/dL
MICROALB UR: 115.4 ug/mL — AB
MICROALB/CREAT RATIO: 231.7 mg/g{creat} — AB (ref 0.0–30.0)

## 2016-10-08 NOTE — Assessment & Plan Note (Signed)
Hyperlipidemia:Low fat diet discussed and encouraged.   Lipid Panel  Lab Results  Component Value Date   CHOL 225 (H) 05/19/2016   HDL 60 05/19/2016   LDLCALC 122 (H) 05/19/2016   TRIG 213 (H) 05/19/2016   CHOLHDL 3.8 05/19/2016     On no medication secondary to severe  Drug allergies will rept in next 4 to 6 months

## 2016-10-08 NOTE — Assessment & Plan Note (Signed)
Uncontrolled and deteriorated , has been off of insulin Script wroitten and written directions given to start basaglar at 50 units daily titrating up every 3  Days as needed, script written for 70 units daily. She is to contiue metformin also Caitlin Thompson is reminded of the importance of commitment to daily physical activity for 30 minutes or more, as able and the need to limit carbohydrate intake to 30 to 60 grams per meal to help with blood sugar control.   The need to take medication as prescribed, test blood sugar as directed, and to call between visits if there is a concern that blood sugar is uncontrolled is also discussed.   Caitlin Thompson is reminded of the importance of daily foot exam, annual eye examination, and good blood sugar, blood pressure and cholesterol control.  Diabetic Labs Latest Ref Rng & Units 10/05/2016 09/27/2016 05/19/2016 10/25/2015 06/24/2015  HbA1c 4.8 - 5.6 % - 12.6(H) 11.2(H) 7.8(H) -  Microalbumin Not Estab. ug/mL 115.4(H) - - - 10.8  Micro/Creat Ratio 0.0 - 30.0 mg/g creat 231.7(H) - - - 67(H)  Chol 0 - 200 mg/dL - - 225(H) 210(H) -  HDL >40 mg/dL - - 60 71 -  Calc LDL 0 - 99 mg/dL - - 122(H) 114(H) -  Triglycerides <150 mg/dL - - 213(H) 127 -  Creatinine 0.44 - 1.00 mg/dL - 0.77 0.69 0.73 -   BP/Weight 10/05/2016 05/24/2016 10/27/2015 06/24/2015 05/04/2015 03/23/2015 1/75/1025  Systolic BP 852 778 242 353 614 431 540  Diastolic BP 82 82 90 88 95 91 84  Wt. (Lbs) 175 176.4 184 185 179 165 173  BMI 31 31.25 32.08 31.74 31.21 28.77 29.68   Foot/eye exam completion dates Latest Ref Rng & Units 07/07/2014 12/29/2013  Eye Exam No Retinopathy - Retinopathy(A)  Foot exam Order - - -  Foot Form Completion - Done -   F/u with log in 6 weeks , call with questions

## 2016-10-08 NOTE — Progress Notes (Signed)
Caitlin Thompson     MRN: [id number]      DOB: 1960/06/09   HPI Caitlin Thompson is here for follow up and re-evaluation of chronic medical conditions, medication management and review of any available recent lab and radiology data.  Preventive health is updated, specifically  Cancer screening and Immunization.  Still needs to schedule colonoscopy which is past due When she tests blood sugar this is elevated states has no insulin unable to afford C/o dry mouth, excess thirst and excess urination, does not feel well, and knows that it is because of her blood sugar  ROS Denies recent fever or chills. Denies sinus pressure, nasal congestion, ear pain or sore throat. Denies chest congestion, productive cough or wheezing. Denies chest pains, palpitations and leg swelling Denies abdominal pain, nausea, vomiting,diarrhea or constipation.    Denies joint pain, swelling and limitation in mobility. Denies headaches, seizures, numbness, or tingling. Denies depression, anxiety or insomnia. Denies skin break down or rash.   PE  BP (!) 158/82   Pulse (!) 109   Resp 16   Ht 5\' 3"  (1.6 m)   Wt 175 lb (79.4 kg)   SpO2 95%   BMI 31.00 kg/m   Patient alert and oriented and in no cardiopulmonary distress.  HEENT: No facial asymmetry, EOMI,   oropharynx pink and moist.  Neck supple no JVD, no mass.  Chest: Clear to auscultation bilaterally.  CVS: S1, S2 no murmurs, no S3.Regular rate.  ABD: Soft non tender.   Ext: No edema  MS: Adequate ROM spine, shoulders, hips and knees.  Skin: Intact, no ulcerations or rash noted.  Psych: Good eye contact, normal affect. Memory intact not anxious or depressed appearing.  CNS: CN 2-12 intact, power,  normal throughout.no focal deficits noted.   Assessment & Plan  Type 2 diabetes, uncontrolled, with retinopathy Uncontrolled and deteriorated , has been off of insulin Script wroitten and written directions given to start basaglar at 50 units daily  titrating up every 3  Days as needed, script written for 70 units daily. She is to contiue metformin also Caitlin Thompson is reminded of the importance of commitment to daily physical activity for 30 minutes or more, as able and the need to limit carbohydrate intake to 30 to 60 grams per meal to help with blood sugar control.   The need to take medication as prescribed, test blood sugar as directed, and to call between visits if there is a concern that blood sugar is uncontrolled is also discussed.   Caitlin Thompson is reminded of the importance of daily foot exam, annual eye examination, and good blood sugar, blood pressure and cholesterol control.  Diabetic Labs Latest Ref Rng & Units 10/05/2016 09/27/2016 05/19/2016 10/25/2015 06/24/2015  HbA1c 4.8 - 5.6 % - 12.6(H) 11.2(H) 7.8(H) -  Microalbumin Not Estab. ug/mL 115.4(H) - - - 10.8  Micro/Creat Ratio 0.0 - 30.0 mg/g creat 231.7(H) - - - 67(H)  Chol 0 - 200 mg/dL - - 225(H) 210(H) -  HDL >40 mg/dL - - 60 71 -  Calc LDL 0 - 99 mg/dL - - 122(H) 114(H) -  Triglycerides <150 mg/dL - - 213(H) 127 -  Creatinine 0.44 - 1.00 mg/dL - 0.77 0.69 0.73 -   BP/Weight 10/05/2016 05/24/2016 10/27/2015 06/24/2015 05/04/2015 03/23/2015 09/03/1599  Systolic BP 093 235 573 220 254 270 623  Diastolic BP 82 82 90 88 95 91 84  Wt. (Lbs) 175 176.4 184 185 179 165 173  BMI 31  31.25 32.08 31.74 31.21 28.77 29.68   Foot/eye exam completion dates Latest Ref Rng & Units 07/07/2014 12/29/2013  Eye Exam No Retinopathy - Retinopathy(A)  Foot exam Order - - -  Foot Form Completion - Done -   F/u with log in 6 weeks , call with questions    Essential hypertension Uncontrolled, non compliant with medicaion Resume amlodipine  DASH diet and commitment to daily physical activity for a minimum of 30 minutes discussed and encouraged, as a part of hypertension management. The importance of attaining a healthy weight is also discussed.  BP/Weight 10/05/2016 05/24/2016 10/27/2015 06/24/2015  05/04/2015 03/23/2015 07/07/5463  Systolic BP 681 275 170 017 494 496 759  Diastolic BP 82 82 90 88 95 91 84  Wt. (Lbs) 175 176.4 184 185 179 165 173  BMI 31 31.25 32.08 31.74 31.21 28.77 29.68       Adenomatous colon polyp Needs rept colonoscopy, has already received a letter form [facility], I have advised her to get this done  Hyperlipidemia LDL goal <100 Hyperlipidemia:Low fat diet discussed and encouraged.   Lipid Panel  Lab Results  Component Value Date   CHOL 225 (H) 05/19/2016   HDL 60 05/19/2016   LDLCALC 122 (H) 05/19/2016   TRIG 213 (H) 05/19/2016   CHOLHDL 3.8 05/19/2016     On no medication secondary to severe  Drug allergies will rept in next 4 to 6 months

## 2016-10-08 NOTE — Assessment & Plan Note (Signed)
Needs rept colonoscopy, has already received a letter form [facility], I have advised her to get this done

## 2016-10-08 NOTE — Assessment & Plan Note (Signed)
Uncontrolled, non compliant with medicaion Resume amlodipine  DASH diet and commitment to daily physical activity for a minimum of 30 minutes discussed and encouraged, as a part of hypertension management. The importance of attaining a healthy weight is also discussed.  BP/Weight 10/05/2016 05/24/2016 10/27/2015 06/24/2015 05/04/2015 03/23/2015 3/81/8403  Systolic BP 754 360 677 034 035 248 185  Diastolic BP 82 82 90 88 95 91 84  Wt. (Lbs) 175 176.4 184 185 179 165 173  BMI 31 31.25 32.08 31.74 31.21 28.77 29.68

## 2016-10-09 ENCOUNTER — Ambulatory Visit: Payer: Commercial Managed Care - HMO | Admitting: Family Medicine

## 2016-12-24 DIAGNOSIS — Z23 Encounter for immunization: Secondary | ICD-10-CM | POA: Diagnosis not present

## 2017-01-03 ENCOUNTER — Telehealth: Payer: Self-pay | Admitting: *Deleted

## 2017-01-03 DIAGNOSIS — IMO0002 Reserved for concepts with insufficient information to code with codable children: Secondary | ICD-10-CM

## 2017-01-03 DIAGNOSIS — E11319 Type 2 diabetes mellitus with unspecified diabetic retinopathy without macular edema: Secondary | ICD-10-CM

## 2017-01-03 DIAGNOSIS — I10 Essential (primary) hypertension: Secondary | ICD-10-CM

## 2017-01-03 DIAGNOSIS — E1165 Type 2 diabetes mellitus with hyperglycemia: Secondary | ICD-10-CM

## 2017-01-03 NOTE — Telephone Encounter (Signed)
Patient called wanting to make sure her lab order is at the lab at [facility].

## 2017-01-05 ENCOUNTER — Other Ambulatory Visit (HOSPITAL_COMMUNITY)
Admission: RE | Admit: 2017-01-05 | Discharge: 2017-01-05 | Disposition: A | Discharge status: Home / Self Care | Payer: 59 | Source: Ambulatory Visit | Attending: Family Medicine | Admitting: Family Medicine

## 2017-01-05 DIAGNOSIS — E1165 Type 2 diabetes mellitus with hyperglycemia: Secondary | ICD-10-CM | POA: Diagnosis not present

## 2017-01-05 DIAGNOSIS — E11319 Type 2 diabetes mellitus with unspecified diabetic retinopathy without macular edema: Secondary | ICD-10-CM | POA: Insufficient documentation

## 2017-01-05 LAB — BASIC METABOLIC PANEL
ANION GAP: 12 (ref 5–15)
BUN: 14 mg/dL (ref 6–20)
CHLORIDE: 100 mmol/L — AB (ref 101–111)
CO2: 25 mmol/L (ref 22–32)
Calcium: 9.9 mg/dL (ref 8.9–10.3)
Creatinine, Ser: 0.87 mg/dL (ref 0.44–1.00)
GFR calc non Af Amer: >60 mL/min (ref >60)
Glucose, Bld: 292 mg/dL — ABNORMAL HIGH (ref 65–99)
POTASSIUM: 4.2 mmol/L (ref 3.5–5.1)
Sodium: 137 mmol/L (ref 135–145)

## 2017-01-05 LAB — HEMOGLOBIN A1C
HEMOGLOBIN A1C: 7.5 % — AB (ref 4.8–5.6)
Mean Plasma Glucose: 168.55 mg/dL

## 2017-01-05 NOTE — Telephone Encounter (Signed)
Lab order faxed.

## 2017-01-08 ENCOUNTER — Encounter: Payer: Self-pay | Admitting: Family Medicine

## 2017-01-08 ENCOUNTER — Ambulatory Visit: Payer: 59 | Admitting: Family Medicine

## 2017-01-08 VITALS — BP 160/94 | HR 86 | Resp 16 | Ht 63.0 in | Wt 192.0 lb

## 2017-01-08 DIAGNOSIS — E785 Hyperlipidemia, unspecified: Secondary | ICD-10-CM

## 2017-01-08 DIAGNOSIS — E663 Overweight: Secondary | ICD-10-CM

## 2017-01-08 DIAGNOSIS — E1169 Type 2 diabetes mellitus with other specified complication: Secondary | ICD-10-CM

## 2017-01-08 DIAGNOSIS — E669 Obesity, unspecified: Secondary | ICD-10-CM | POA: Diagnosis not present

## 2017-01-08 DIAGNOSIS — I10 Essential (primary) hypertension: Secondary | ICD-10-CM | POA: Diagnosis not present

## 2017-01-08 NOTE — Patient Instructions (Signed)
CPE and pap end Feb , call if you need me sooner  CONGRATULATIONS on excellent  Blood sugar, no change in medication management   Please start taking the amlodipine one daily for blood pressure , same time every day  It is important that you exercise regularly at least 45 minutes 7 times a week. If you develop chest pain, have severe difficulty breathing, or feel very tired, stop exercising immediately and seek medical attention     CBC, fasting lipid, cmp and EGFr, tSh. Vit D 1 week before next visit    Thank you  for choosing [facility]. We consider it a privelige to serve you.  Delivering excellent health care in a caring and  compassionate way is our goal.  Partnering with you,  so that together we can achieve this goal is our strategy.

## 2017-01-14 NOTE — Assessment & Plan Note (Signed)
Colonscopy past due, pt needs to contact GI and is aware

## 2017-01-14 NOTE — Assessment & Plan Note (Signed)
Deteriorated. Patient re-educated about  the importance of commitment to a  minimum of 150 minutes of exercise per week.  The importance of healthy food choices with portion control discussed. Encouraged to start a food diary, count calories and to consider  joining a support group. Sample diet sheets offered. Goals set by the patient for the next several months.   Weight /BMI 01/08/2017 10/05/2016 05/24/2016  WEIGHT 192 lb 175 lb 176 lb 6.4 oz  HEIGHT 5\' 3"  5\' 3"  5\' 3"   BMI 34.01 kg/m2 31 kg/m2 31.25 kg/m2

## 2017-01-14 NOTE — Progress Notes (Signed)
Caitlin Thompson     MRN: [id number]      DOB: Jun 25, 1960   HPI Caitlin Thompson is here for follow up and re-evaluation of chronic medical conditions, medication management and review of any available recent lab and radiology data.  Preventive health is updated, specifically  Cancer screening and Immunization.   Questions or concerns regarding consultations or procedures which the PT has had in the interim are  addressed. The PT denies any adverse reactions to current medications since the last visit.  There are no new concerns.  There are no specific complaints   ROS Denies recent fever or chills. Denies sinus pressure, nasal congestion, ear pain or sore throat. Denies chest congestion, productive cough or wheezing. Denies chest pains, palpitations and leg swelling Denies abdominal pain, nausea, vomiting,diarrhea or constipation.   Denies dysuria, frequency, hesitancy or incontinence. Denies joint pain, swelling and limitation in mobility. Denies headaches, seizures, numbness, or tingling. Denies depression, anxiety or insomnia. Denies skin break down or rash.   PE  BP (!) 160/94   Pulse 86   Resp 16   Ht 5\' 3"  (1.6 m)   Wt 192 lb (87.1 kg)   SpO2 96%   BMI 34.01 kg/m   Patient alert and oriented and in no cardiopulmonary distress.  HEENT: No facial asymmetry, EOMI,   oropharynx pink and moist.  Neck supple no JVD, no mass.  Chest: Clear to auscultation bilaterally.  CVS: S1, S2 no murmurs, no S3.Regular rate.  ABD: Soft non tender.   Ext: No edema  MS: Adequate ROM spine, shoulders, hips and knees.  Skin: Intact, no ulcerations or rash noted.  Psych: Good eye contact, normal affect. Memory intact not anxious or depressed appearing.  CNS: CN 2-12 intact, power,  normal throughout.no focal deficits noted.   Assessment & Plan  Essential hypertension Uncontrolled, needs to take medication DASH diet and commitment to daily physical activity for a minimum of 30  minutes discussed and encouraged, as a part of hypertension management. The importance of attaining a healthy weight is also discussed.  BP/Weight 01/08/2017 10/05/2016 05/24/2016 10/27/2015 06/24/2015 05/04/2015 03/08/7251  Systolic BP 664 403 474 259 563 875 643  Diastolic BP 94 82 82 90 88 95 91  Wt. (Lbs) 192 175 176.4 184 185 179 165  BMI 34.01 31 31.25 32.08 31.74 31.21 28.77       Type 2 diabetes, uncontrolled, with retinopathy Markedly improved, pt applauded  On this, no medication change Caitlin Thompson is reminded of the importance of commitment to daily physical activity for 30 minutes or more, as able and the need to limit carbohydrate intake to 30 to 60 grams per meal to help with blood sugar control.   The need to take medication as prescribed, test blood sugar as directed, and to call between visits if there is a concern that blood sugar is uncontrolled is also discussed.   Caitlin Thompson is reminded of the importance of daily foot exam, annual eye examination, and good blood sugar, blood pressure and cholesterol control.  Diabetic Labs Latest Ref Rng & Units 01/05/2017 10/05/2016 09/27/2016 05/19/2016 10/25/2015  HbA1c 4.8 - 5.6 % 7.5(H) - 12.6(H) 11.2(H) 7.8(H)  Microalbumin Not Estab. ug/mL - 115.4(H) - - -  Micro/Creat Ratio 0.0 - 30.0 mg/g creat - 231.7(H) - - -  Chol 0 - 200 mg/dL - - - 225(H) 210(H)  HDL >40 mg/dL - - - 60 71  Calc LDL 0 - 99 mg/dL - - -  122(H) 114(H)  Triglycerides <150 mg/dL - - - 213(H) 127  Creatinine 0.44 - 1.00 mg/dL 0.87 - 0.77 0.69 0.73   BP/Weight 01/08/2017 10/05/2016 05/24/2016 10/27/2015 06/24/2015 05/04/2015 03/16/7354  Systolic BP 701 410 301 314 388 875 797  Diastolic BP 94 82 82 90 88 95 91  Wt. (Lbs) 192 175 176.4 184 185 179 165  BMI 34.01 31 31.25 32.08 31.74 31.21 28.77   Foot/eye exam completion dates Latest Ref Rng & Units 01/08/2017 07/07/2014  Eye Exam No Retinopathy - -  Foot exam Order - - -  Foot Form Completion - Done Done          Hyperlipidemia LDL goal <100 Hyperlipidemia:Low fat diet discussed and encouraged.   Lipid Panel  Lab Results  Component Value Date   CHOL 225 (H) 05/19/2016   HDL 60 05/19/2016   LDLCALC 122 (H) 05/19/2016   TRIG 213 (H) 05/19/2016   CHOLHDL 3.8 05/19/2016  WILL BENEFIT FROM STATIN BUT DOES NOT WANT THE MEDICATION   Adenomatous colon polyp Colonscopy past due, pt needs to contact GI and is aware  Overweight Deteriorated. Patient re-educated about  the importance of commitment to a  minimum of 150 minutes of exercise per week.  The importance of healthy food choices with portion control discussed. Encouraged to start a food diary, count calories and to consider  joining a support group. Sample diet sheets offered. Goals set by the patient for the next several months.   Weight /BMI 01/08/2017 10/05/2016 05/24/2016  WEIGHT 192 lb 175 lb 176 lb 6.4 oz  HEIGHT 5\' 3"  5\' 3"  5\' 3"   BMI 34.01 kg/m2 31 kg/m2 31.25 kg/m2

## 2017-01-14 NOTE — Assessment & Plan Note (Signed)
Hyperlipidemia:Low fat diet discussed and encouraged.   Lipid Panel  Lab Results  Component Value Date   CHOL 225 (H) 05/19/2016   HDL 60 05/19/2016   LDLCALC 122 (H) 05/19/2016   TRIG 213 (H) 05/19/2016   CHOLHDL 3.8 05/19/2016  WILL BENEFIT FROM STATIN BUT DOES NOT WANT THE MEDICATION

## 2017-01-14 NOTE — Assessment & Plan Note (Signed)
Uncontrolled, needs to take medication DASH diet and commitment to daily physical activity for a minimum of 30 minutes discussed and encouraged, as a part of hypertension management. The importance of attaining a healthy weight is also discussed.  BP/Weight 01/08/2017 10/05/2016 05/24/2016 10/27/2015 06/24/2015 05/04/2015 03/20/7260  Systolic BP 035 597 416 384 536 468 032  Diastolic BP 94 82 82 90 88 95 91  Wt. (Lbs) 192 175 176.4 184 185 179 165  BMI 34.01 31 31.25 32.08 31.74 31.21 28.77

## 2017-01-14 NOTE — Assessment & Plan Note (Signed)
Markedly improved, pt applauded  On this, no medication change Ms. Gora is reminded of the importance of commitment to daily physical activity for 30 minutes or more, as able and the need to limit carbohydrate intake to 30 to 60 grams per meal to help with blood sugar control.   The need to take medication as prescribed, test blood sugar as directed, and to call between visits if there is a concern that blood sugar is uncontrolled is also discussed.   Ms. Ishikawa is reminded of the importance of daily foot exam, annual eye examination, and good blood sugar, blood pressure and cholesterol control.  Diabetic Labs Latest Ref Rng & Units 01/05/2017 10/05/2016 09/27/2016 05/19/2016 10/25/2015  HbA1c 4.8 - 5.6 % 7.5(H) - 12.6(H) 11.2(H) 7.8(H)  Microalbumin Not Estab. ug/mL - 115.4(H) - - -  Micro/Creat Ratio 0.0 - 30.0 mg/g creat - 231.7(H) - - -  Chol 0 - 200 mg/dL - - - 225(H) 210(H)  HDL >40 mg/dL - - - 60 71  Calc LDL 0 - 99 mg/dL - - - 122(H) 114(H)  Triglycerides <150 mg/dL - - - 213(H) 127  Creatinine 0.44 - 1.00 mg/dL 0.87 - 0.77 0.69 0.73   BP/Weight 01/08/2017 10/05/2016 05/24/2016 10/27/2015 06/24/2015 05/04/2015 09/04/6376  Systolic BP 588 502 774 128 786 767 209  Diastolic BP 94 82 82 90 88 95 91  Wt. (Lbs) 192 175 176.4 184 185 179 165  BMI 34.01 31 31.25 32.08 31.74 31.21 28.77   Foot/eye exam completion dates Latest Ref Rng & Units 01/08/2017 07/07/2014  Eye Exam No Retinopathy - -  Foot exam Order - - -  Foot Form Completion - Done Done

## 2017-02-14 ENCOUNTER — Other Ambulatory Visit: Payer: Self-pay | Admitting: Family Medicine

## 2017-03-02 IMAGING — DX DG CHEST 2V
2 series · 2 of 2 positions shown · non-contrast
Comparison: Prior lumbar spine radiographs 11/10/2006

CLINICAL DATA: 53-year-old female with acute bronchitis, cough and
congestion x1 week

EXAM:
CHEST  2 VIEW

[chest pa]
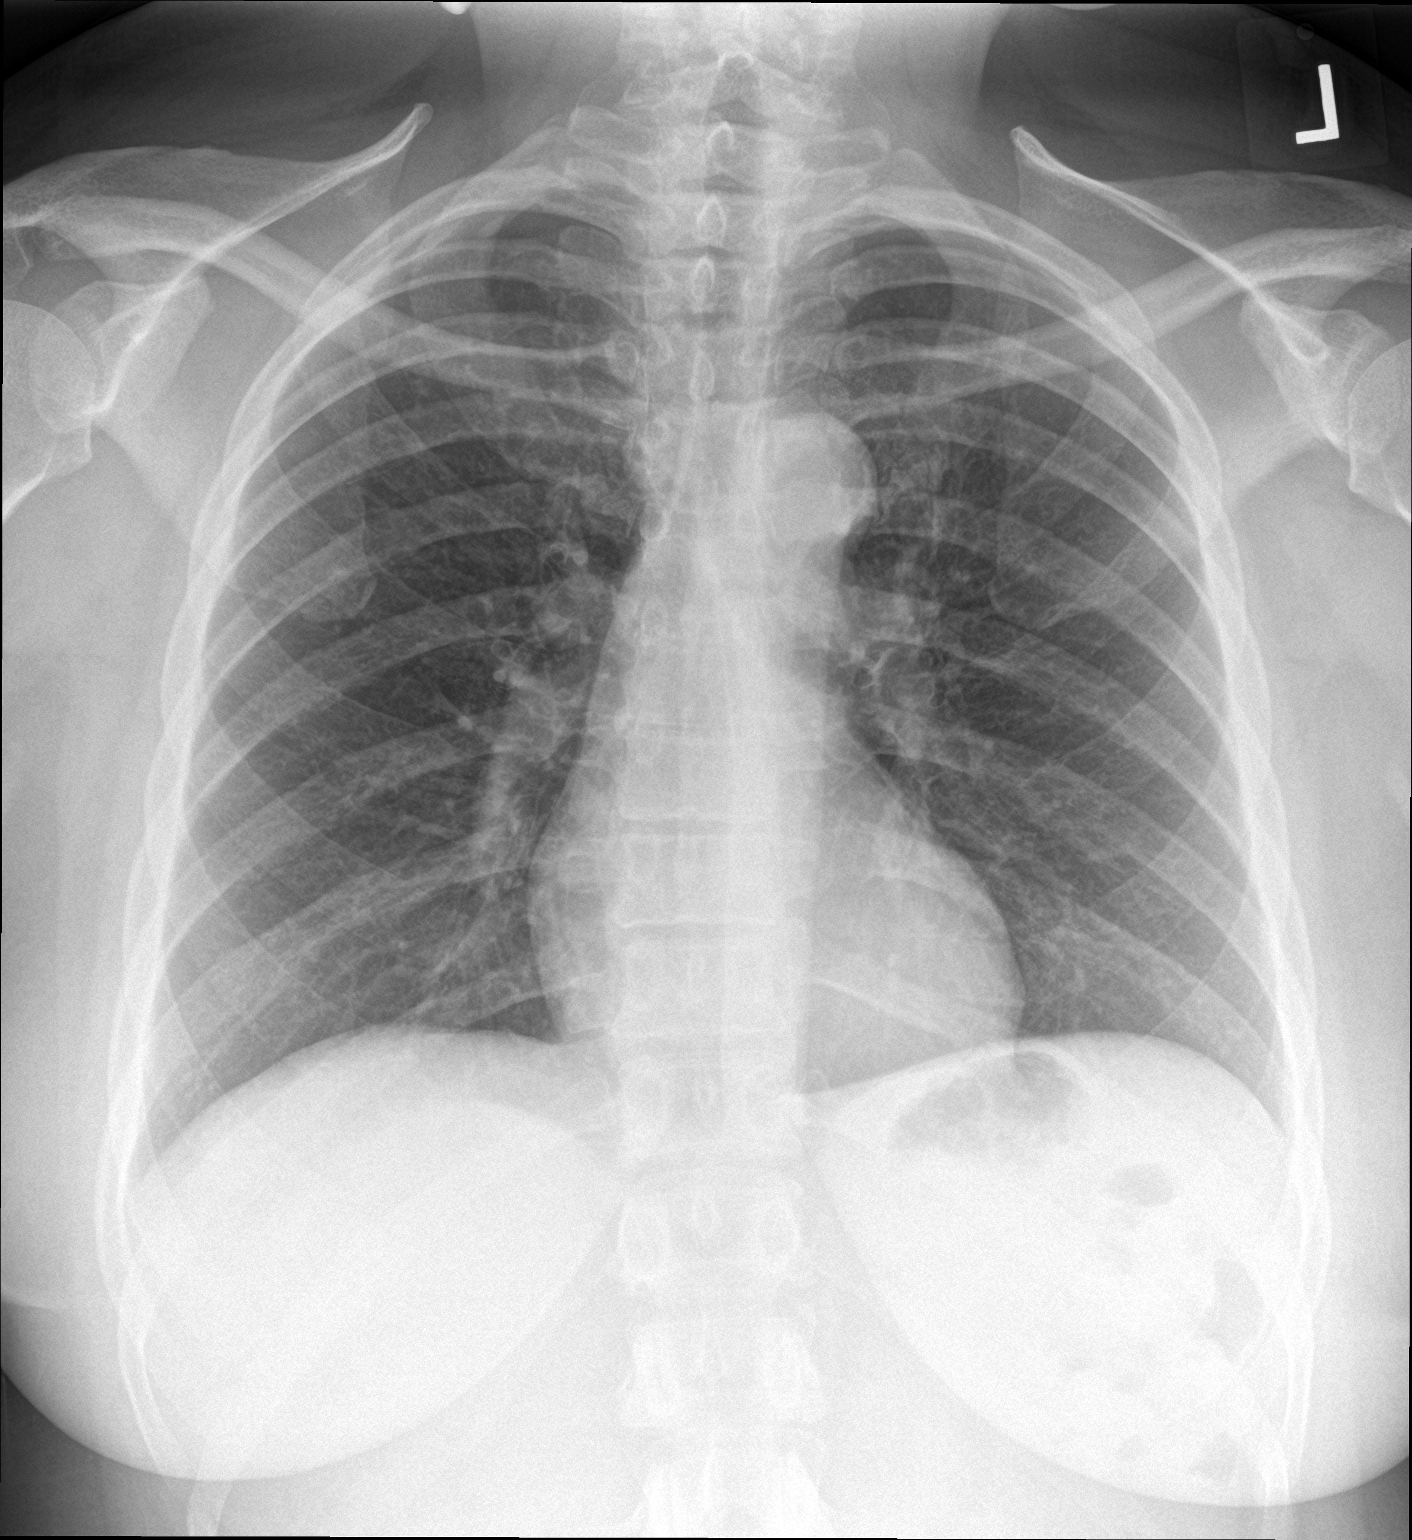

[chest lat]
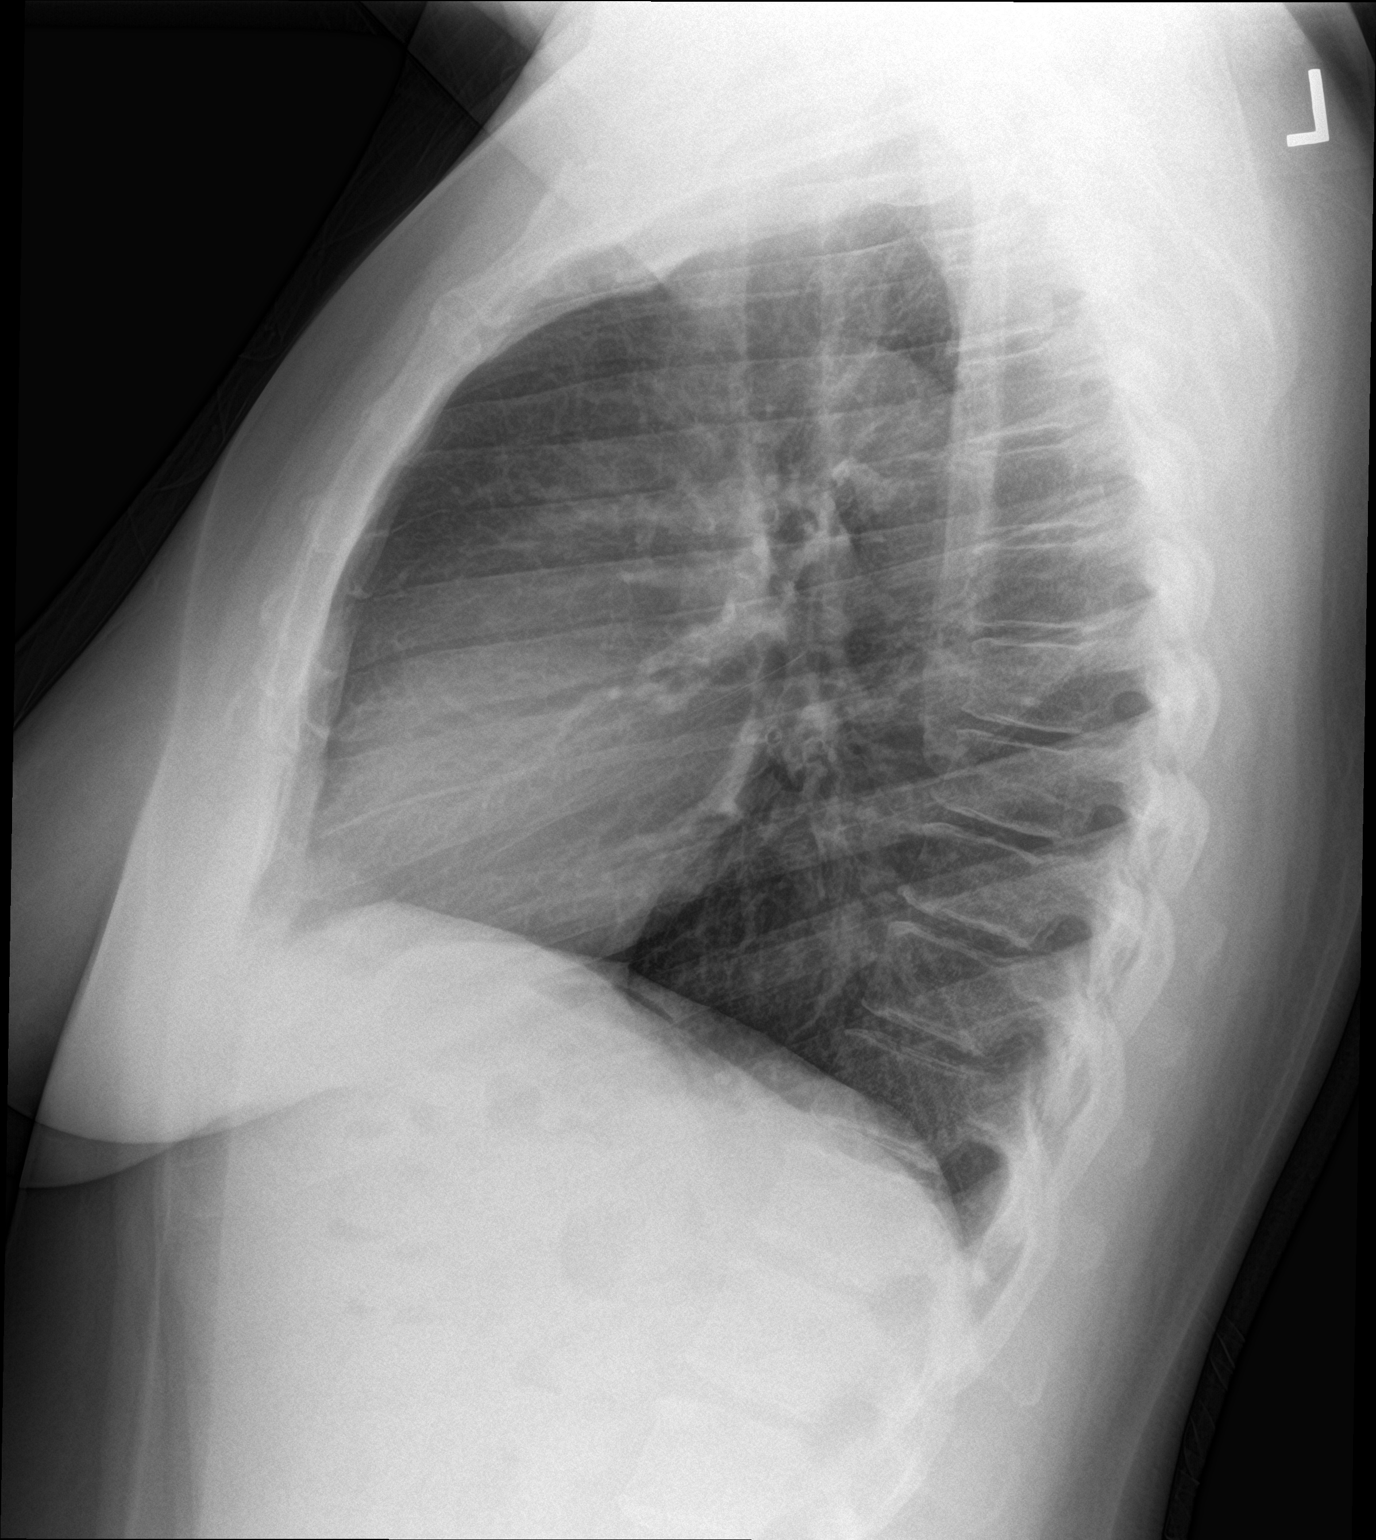

[2 of 2 positions shown; findings below may reference images not displayed]

FINDINGS: The lungs are clear and negative for focal airspace consolidation,
pulmonary edema or suspicious pulmonary nodule. No pleural effusion
or pneumothorax. Cardiac and mediastinal contours are within normal
limits. No acute fracture or lytic or blastic osseous lesions. The
visualized upper abdominal bowel gas pattern is unremarkable.
IMPRESSION: No active cardiopulmonary disease.

## 2017-05-04 ENCOUNTER — Other Ambulatory Visit (HOSPITAL_COMMUNITY)
Admission: RE | Admit: 2017-05-04 | Discharge: 2017-05-04 | Disposition: A | Discharge status: Home / Self Care | Payer: 59 | Source: Ambulatory Visit | Attending: Family Medicine | Admitting: Family Medicine

## 2017-05-04 ENCOUNTER — Telehealth: Payer: Self-pay

## 2017-05-04 DIAGNOSIS — E119 Type 2 diabetes mellitus without complications: Secondary | ICD-10-CM | POA: Insufficient documentation

## 2017-05-04 DIAGNOSIS — E669 Obesity, unspecified: Secondary | ICD-10-CM | POA: Diagnosis not present

## 2017-05-04 LAB — BASIC METABOLIC PANEL
ANION GAP: 13 (ref 5–15)
BUN: 12 mg/dL (ref 6–20)
CALCIUM: 9.8 mg/dL (ref 8.9–10.3)
CO2: 23 mmol/L (ref 22–32)
CREATININE: 0.77 mg/dL (ref 0.44–1.00)
Chloride: 105 mmol/L (ref 101–111)
GFR calc Af Amer: >60 mL/min (ref >60)
Glucose, Bld: 151 mg/dL — ABNORMAL HIGH (ref 65–99)
Potassium: 3.9 mmol/L (ref 3.5–5.1)
Sodium: 141 mmol/L (ref 135–145)

## 2017-05-04 LAB — HEMOGLOBIN A1C
Hgb A1c MFr Bld: 7.3 % — ABNORMAL HIGH (ref 4.8–5.6)
Mean Plasma Glucose: 162.81 mg/dL

## 2017-05-04 NOTE — Telephone Encounter (Signed)
Pt called and requested you send in her Lab request to [facility].

## 2017-05-04 NOTE — Telephone Encounter (Signed)
Faxed over

## 2017-05-08 ENCOUNTER — Ambulatory Visit (INDEPENDENT_AMBULATORY_CARE_PROVIDER_SITE_OTHER): Payer: 59 | Admitting: Family Medicine

## 2017-05-08 ENCOUNTER — Other Ambulatory Visit (HOSPITAL_COMMUNITY)
Admission: RE | Admit: 2017-05-08 | Discharge: 2017-05-08 | Disposition: A | Discharge status: Home / Self Care | Payer: 59 | Source: Ambulatory Visit | Attending: Family Medicine | Admitting: Family Medicine

## 2017-05-08 ENCOUNTER — Encounter: Payer: Self-pay | Admitting: Family Medicine

## 2017-05-08 VITALS — BP 150/82 | HR 84 | Resp 14 | Ht 65.0 in | Wt 188.0 lb

## 2017-05-08 DIAGNOSIS — I10 Essential (primary) hypertension: Secondary | ICD-10-CM | POA: Diagnosis not present

## 2017-05-08 DIAGNOSIS — Z0000 Encounter for general adult medical examination without abnormal findings: Secondary | ICD-10-CM

## 2017-05-08 DIAGNOSIS — Z124 Encounter for screening for malignant neoplasm of cervix: Secondary | ICD-10-CM | POA: Insufficient documentation

## 2017-05-08 DIAGNOSIS — D126 Benign neoplasm of colon, unspecified: Secondary | ICD-10-CM

## 2017-05-08 DIAGNOSIS — E1165 Type 2 diabetes mellitus with hyperglycemia: Secondary | ICD-10-CM

## 2017-05-08 DIAGNOSIS — E785 Hyperlipidemia, unspecified: Secondary | ICD-10-CM | POA: Diagnosis not present

## 2017-05-08 DIAGNOSIS — IMO0002 Reserved for concepts with insufficient information to code with codable children: Secondary | ICD-10-CM

## 2017-05-08 DIAGNOSIS — E11319 Type 2 diabetes mellitus with unspecified diabetic retinopathy without macular edema: Secondary | ICD-10-CM

## 2017-05-08 MED ORDER — SPIRONOLACTONE 25 MG PO TABS: 25.0000 mg | ORAL_TABLET | Freq: Every day | ORAL | 3 refills | Status: DC

## 2017-05-08 NOTE — Progress Notes (Addendum)
Caitlin Thompson     MRN: [id number]      DOB: Sep 23, 1960  HPI: Patient is in for annual physical exam. Blood pressure is uncontrolled, not taking medication, thinks it was causing a rash , but will try it again.  Marland Kitchen Recent labs,  are reviewed. Immunization is reviewed , and  updated if needed.   PE: BP (!) 150/82   Pulse 84   Resp 14   Ht 5\' 5"  (1.651 m)   Wt 188 lb (85.3 kg)   SpO2 96%   BMI 31.28 kg/m   Pleasant  female, alert and oriented x 3, in no cardio-pulmonary distress. Afebrile. HEENT No facial trauma or asymetry. Sinuses non tender.  Extra occullar muscles intact External ears normal, Oropharynx moist, no exudate. Neck: decreased though adequate ROM, no adenopathy,JVD or thyromegaly.No bruits.  Chest: Clear to ascultation bilaterally.No crackles or wheezes. Non tender to palpation  Breast: No asymetry,no masses or lumps. No tenderness. No nipple discharge or inversion. No axillary or supraclavicular adenopathy  Cardiovascular system; Heart sounds normal,  S1 and  S2 ,no S3.  No murmur, or thrill. Apical beat not displaced Peripheral pulses normal.  Abdomen: Soft, non tender, no organomegaly or masses. No bruits. Bowel sounds normal. No guarding, tenderness or rebound.  Rectal:  Not done.Has appt with GI past due and will go this year  GU: External genitalia normal female genitalia , normal female distribution of hair. No lesions. Urethral meatus normal in size, no  Prolapse, no lesions visibly  Present. Bladder non tender. Vagina pink and moist , with no visible lesions , discharge present . Adequate pelvic support no  cystocele or rectocele noted Cervix pink and appears healthy, no lesions or ulcerations noted, no discharge noted from os Uterus normal size, no adnexal masses, no cervical motion or adnexal tenderness.   Musculoskeletal exam: Full ROM of spine, hips , shoulders and knees. No deformity ,swelling or crepitus noted. No  muscle wasting or atrophy.   Neurologic: Cranial nerves 2 to 12 intact. Power, tone ,sensation and reflexes normal throughout. No disturbance in gait. No tremor.  Skin: Intact, no ulceration, mild erythema , scaling  rash noted.on feet Pigmentation normal throughout  Psych; Normal mood and affect. Judgement and concentration normal   Assessment & Plan:  Annual physical exam Annual exam as documented. Counseling done  re healthy lifestyle involving commitment to 150 minutes exercise per week, heart healthy diet, and attaining healthy weight.The importance of adequate sleep also discussed.  Changes in health habits are decided on by the patient with goals and time frames  set for achieving them. Immunization and cancer screening needs are specifically addressed at this visit.   Essential hypertension Uncontrolled, due to non compliance , pt to resume amlodipine, she will call if it causes rash, remove spironolactone DASH diet and commitment to daily physical activity for a minimum of 30 minutes discussed and encouraged, as a part of hypertension management. The importance of attaining a healthy weight is also discussed.  BP/Weight 05/08/2017 01/08/2017 10/05/2016 05/24/2016 10/27/2015 06/24/2015 06/06/4740  Systolic BP 595 638 756 433 295 188 416  Diastolic BP 82 94 82 82 90 88 95  Wt. (Lbs) 188 192 175 176.4 184 185 179  BMI 31.28 34.01 31 31.25 32.08 31.74 31.21       Type 2 diabetes, uncontrolled, with retinopathy Controlled, no change in medication Refer for eye exam  Tubular adenoma of colon GI re eval by Dr Sydell Axon is past due  pt agrees to be seen, no stool testing ordered at viist

## 2017-05-08 NOTE — Patient Instructions (Addendum)
F/u in 4 months, call if you need me sooner  Fasting lipid, cmp and eGFR, hBA1c  1 week before follow up  Resume amlodipine for blood pressure  You are being referred to Dr Katy Fitch and to Dr Janie Morning  It is important that you exercise regularly at least 30 minutes 5 times a week. If you develop chest pain, have severe difficulty breathing, or feel very tired, stop exercising immediately and seek medical attention  Please work on good  health habits so that your health will improve. 1. Commitment to daily physical activity for 30 to 60  minutes, if you are able to do this.  2. Commitment to wise food choices. Aim for half of your  food intake to be vegetable and fruit, one quarter starchy foods, and one quarter protein. Try to eat on a regular schedule  3 meals per day, snacking between meals should be limited to vegetables or fruits or small portions of nuts. 64 ounces of water per day is generally recommended, unless you have specific health conditions, like heart failure or kidney failure where you will need to limit fluid intake.  3. Commitment to sufficient and a  good quality of physical and mental rest daily, generally between 6 to 8 hours per day.  WITH PERSISTANCE AND PERSEVERANCE, THE IMPOSSIBLE , BECOMES THE NORM!   Thank you  for choosing [facility]. We consider it a privelige to serve you.  Delivering excellent health care in a caring and  compassionate way is our goal.  Partnering with you,  so that together we can achieve this goal is our strategy.

## 2017-05-08 NOTE — Assessment & Plan Note (Signed)
Controlled, no change in medication Refer for eye exam

## 2017-05-08 NOTE — Assessment & Plan Note (Signed)
Uncontrolled, due to non compliance , pt to resume amlodipine, she will call if it causes rash, remove spironolactone DASH diet and commitment to daily physical activity for a minimum of 30 minutes discussed and encouraged, as a part of hypertension management. The importance of attaining a healthy weight is also discussed.  BP/Weight 05/08/2017 01/08/2017 10/05/2016 05/24/2016 10/27/2015 06/24/2015 06/22/6220  Systolic BP 979 892 119 417 408 144 818  Diastolic BP 82 94 82 82 90 88 95  Wt. (Lbs) 188 192 175 176.4 184 185 179  BMI 31.28 34.01 31 31.25 32.08 31.74 31.21

## 2017-05-08 NOTE — Assessment & Plan Note (Signed)
Annual exam as documented. Counseling done  re healthy lifestyle involving commitment to 150 minutes exercise per week, heart healthy diet, and attaining healthy weight.The importance of adequate sleep also discussed. Changes in health habits are decided on by the patient with goals and time frames  set for achieving them. Immunization and cancer screening needs are specifically addressed at this visit. 

## 2017-05-08 NOTE — Assessment & Plan Note (Signed)
GI re eval by Dr Sydell Axon is past due pt agrees to be seen, no stool testing ordered at viist

## 2017-05-09 ENCOUNTER — Encounter: Payer: Self-pay | Admitting: Internal Medicine

## 2017-05-11 LAB — CYTOLOGY - PAP
Diagnosis: NEGATIVE
HPV (WINDOPATH): NOT DETECTED

## 2017-05-12 ENCOUNTER — Other Ambulatory Visit: Payer: Self-pay | Admitting: Family Medicine

## 2017-05-14 ENCOUNTER — Other Ambulatory Visit: Payer: Self-pay | Admitting: Family Medicine

## 2017-06-25 DIAGNOSIS — H40013 Open angle with borderline findings, low risk, bilateral: Secondary | ICD-10-CM | POA: Diagnosis not present

## 2017-06-25 DIAGNOSIS — E113413 Type 2 diabetes mellitus with severe nonproliferative diabetic retinopathy with macular edema, bilateral: Secondary | ICD-10-CM | POA: Diagnosis not present

## 2017-07-03 ENCOUNTER — Ambulatory Visit: Payer: 59 | Admitting: Gastroenterology

## 2017-07-06 ENCOUNTER — Telehealth: Payer: Self-pay

## 2017-07-06 NOTE — Telephone Encounter (Signed)
Patient called stating she needed an antibiotic called in  for a bladder infection. The one she had at home was too old and wasn't helping. Please call back [phone]

## 2017-07-06 NOTE — Telephone Encounter (Signed)
Patient called back, everyone was out of the office at this time. Please call her back to followup.

## 2017-07-06 NOTE — Telephone Encounter (Signed)
Left generic message requesting call back to discuss.

## 2017-07-09 NOTE — Telephone Encounter (Signed)
LVM that the pt could come in for a Nurse visit.

## 2017-07-09 NOTE — Telephone Encounter (Signed)
Can come in for nurse visit today. Has to leave urine specimen to be checked

## 2017-07-11 ENCOUNTER — Ambulatory Visit (INDEPENDENT_AMBULATORY_CARE_PROVIDER_SITE_OTHER): Payer: 59

## 2017-07-11 ENCOUNTER — Telehealth: Payer: Self-pay

## 2017-07-11 VITALS — BP 130/98

## 2017-07-11 DIAGNOSIS — N3000 Acute cystitis without hematuria: Secondary | ICD-10-CM | POA: Diagnosis not present

## 2017-07-11 LAB — POCT URINALYSIS DIPSTICK
APPEARANCE: NORMAL
Bilirubin, UA: NEGATIVE
Blood, UA: NEGATIVE
Glucose, UA: NEGATIVE
KETONES UA: NEGATIVE
NITRITE UA: NEGATIVE
ODOR: NORMAL
PH UA: 5.5 (ref 5.0–8.0)
PROTEIN UA: NEGATIVE
Spec Grav, UA: 1.025 (ref 1.010–1.025)
UROBILINOGEN UA: 0.2 U/dL

## 2017-07-11 NOTE — Telephone Encounter (Signed)
Pt called in wanting medicine for UTI, I advised she needed a nurse visit.

## 2017-07-11 NOTE — Telephone Encounter (Signed)
The blood pressure is not extremely elevated, at that number , she can be given an appointment when I am next available which will not be before last w week in May or first week in June, please schedule soonest avialabe for blood pressure management by me and let her know

## 2017-07-11 NOTE — Telephone Encounter (Signed)
Came in today for a nurse visit for UTI check and when she got back in the room she started talking about her elevated BP and her legs and ankles swelling and she wanted to know what you wanted to do. Abby checked her BP and it was 130/98. Please advise

## 2017-07-12 ENCOUNTER — Telehealth: Payer: Self-pay | Admitting: Family Medicine

## 2017-07-12 ENCOUNTER — Ambulatory Visit (HOSPITAL_COMMUNITY)
Admission: EM | Admit: 2017-07-12 | Discharge: 2017-07-12 | Disposition: A | Discharge status: Home / Self Care | Payer: 59 | Attending: Emergency Medicine | Admitting: Emergency Medicine

## 2017-07-12 ENCOUNTER — Encounter (HOSPITAL_COMMUNITY): Payer: Self-pay | Admitting: Family Medicine

## 2017-07-12 DIAGNOSIS — R600 Localized edema: Secondary | ICD-10-CM

## 2017-07-12 MED ORDER — FUROSEMIDE 20 MG PO TABS: 20.0000 mg | ORAL_TABLET | Freq: Every day | ORAL | 0 refills | Status: DC

## 2017-07-12 NOTE — Telephone Encounter (Signed)
.  please schedule in June per dr Moshe Cipro

## 2017-07-12 NOTE — Discharge Instructions (Signed)
Please take lasix for 3-4 days  Follow up with PCP for discussion about swelling/possibly of relation to amlodipine  Monitor BP at home  Elevate legs when resting, compression stockings to the knee

## 2017-07-12 NOTE — ED Triage Notes (Signed)
PT reports bilateral ankle swelling and lower leg pain for 1-2 weeks. PT saw a tick on her leg 2 weeks ago, but she doesn't think it bit her.

## 2017-07-12 NOTE — Telephone Encounter (Signed)
Patient states she will call back for an appt in June.

## 2017-07-13 NOTE — ED Provider Notes (Signed)
[facility]    CSN: [id number] Arrival date & time: 07/12/17  1450     History   Chief Complaint Chief Complaint  Patient presents with  . Leg Swelling    HPI Caitlin Thompson is a 57 y.o. female history of DM type II, hypertension, hyperlipidemia presenting today for evaluation of bilateral lower extremity swelling.  Swelling has been occurring for the past 1 to 2 weeks, flares up and down.  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  Swelling has caused some pain.  Denies any redness overlying skin.  She does note that she saw tick on her leg approximately 2 weeks ago, but it did not bite her.  She has also had slight fatigue and felt feverish, but no measurement of a true fever.  She is concerned about tickborne illness.  Patient states that she is still fairly active, will run for exercise, but has not been able to do this recently due to the pain and swelling.  She also notes that she began amlodipine a couple months ago.  HPI  Past Medical History:  Diagnosis Date  . Adenomatous colon polyp 06/26/2011   05/17/11 Colonoscopy Dr Chauncy Lean adenoma Next colonoscopy 05/2016   . DERMATOMYCOSIS 05/23/2010   Qualifier: Diagnosis of  By: Moshe Cipro MD, Joycelyn Schmid    . Diabetes mellitus type II 2007  . GERD (gastroesophageal reflux disease) 04/03/2011   Non-critical Schatzki's ring, HH on EGD 05/17/11 Dr Gala Romney   . Hematuria   . HYDRONEPHROSIS, LEFT 08/30/2009   Qualifier: Diagnosis of  By: Claybon Jabs PA, Dawn    . HYPERLIPIDEMIA 12/01/2009   Qualifier: Diagnosis of  By: Claybon Jabs PA, [place]    . Hypertension   . Obesity (BMI 30.0-34.9) 07/15/2011  . Renal lithiasis 6/11   Hospitalized [facility] 3 days in June   . Schatzki's ring   . THYROID STIMULATING HORMONE, ABNORMAL 08/30/2009   Qualifier: Diagnosis of  By: Claybon Jabs PA, Dawn    . Tubular adenoma of colon 3/13    Patient Active Problem List   Diagnosis Date Noted  . Spondylosis, cervical  05/27/2016  . Type 2 diabetes, uncontrolled, with retinopathy ([place]) 03/23/2014  . Annual physical exam 12/15/2013  . Overweight 07/15/2011  . Adenomatous colon polyp 06/26/2011  . Tubular adenoma of colon 05/09/2011  . GERD (gastroesophageal reflux disease) 04/03/2011  . Essential hypertension 03/03/2010  . Hyperlipidemia LDL goal <100 12/01/2009  . RENAL CALCULUS 08/30/2009    Past Surgical History:  Procedure Laterality Date  . COLONOSCOPY W/ POLYPECTOMY  05/17/11   Rourk-Tubular adenoma colon, benign SB bx  . ESOPHAGEAL DILATION  05/17/2011   Rourk-Hiatal hernia/Incomplete noncritical Schatzki's ring/HH  . WISDOM TOOTH EXTRACTION      OB History   None      Home Medications    Prior to Admission medications   Medication Sig Start Date End Date Taking? Authorizing Provider  amLODipine (NORVASC) 5 MG tablet Take 1 tablet (5 mg total) by mouth daily. 10/05/16  Yes Fayrene Helper, MD  furosemide (LASIX) 20 MG tablet Take 1 tablet (20 mg total) by mouth daily for 4 days. 07/12/17 07/16/17  Trevaris Pennella C, PA-C  Insulin Glargine (BASAGLAR KWIKPEN) 100 UNIT/ML SOPN INJECT 70 UNITS SUBCUTANEOUSLY ONCE DAILY 05/14/17   Fayrene Helper, MD  metFORMIN (GLUCOPHAGE) 500 MG tablet Take two tablets twice daily 05/24/16   Fayrene Helper, MD  [facility] VERIO test strip USE ONE  STRIP TO CHECK GLUCOSE THREE TIMES DAILY 02/15/17   Fayrene Helper, MD  [facility] VERIO test strip USE ONE STRIP TO CHECK GLUCOSE THREE TIMES DAILY 05/14/17   Fayrene Helper, MD    Family History Family History  Problem Relation Age of Onset  . Diabetes Mother   . Hypertension Mother   . Kidney failure Mother   . Diabetes Father   . Hypertension Father   . Kidney failure Father   . Diabetes Brother        x2  . Crohn's disease Sister   . Aneurysm Sister     Social History Social History   Tobacco Use  . Smoking status: Never Smoker  . Smokeless tobacco: Never Used  Substance Use  Topics  . Alcohol use: No  . Drug use: No     Allergies   Codeine; Fish allergy; Parlodel [bromocriptine mesylate]; Penicillins; and Sulfonamide derivatives   Review of Systems Review of Systems  Constitutional: Positive for fatigue and fever. Negative for activity change, appetite change and chills.  Eyes: Negative for visual disturbance.  Respiratory: Negative for cough and shortness of breath.   Cardiovascular: Positive for leg swelling. Negative for chest pain.  Gastrointestinal: Negative for abdominal pain, diarrhea, nausea and vomiting.  Musculoskeletal: Negative for back pain and gait problem.  Skin: Negative for color change, rash and wound.  Neurological: Negative for dizziness, syncope, weakness, light-headedness, numbness and headaches.     Physical Exam Triage Vital Signs ED Triage Vitals [07/12/17 1535]  Enc Vitals Group     BP (!) 169/91     Pulse Rate 92     Resp 16     Temp 97.8 F (36.6 C)     Temp Source Temporal     SpO2 100 %     Weight 180 lb (81.6 kg)     Height      Head Circumference      Peak Flow      Pain Score 4     Pain Loc      Pain Edu?      Excl. in [place]?    No data found.  Updated Vital Signs BP (!) 169/91   Pulse 92   Temp 97.8 F (36.6 C) (Temporal)   Resp 16   Wt 180 lb (81.6 kg)   SpO2 100%   BMI 29.95 kg/m   Visual Acuity Right Eye Distance:   Left Eye Distance:   Bilateral Distance:    Right Eye Near:   Left Eye Near:    Bilateral Near:     Physical Exam  Constitutional: She appears well-developed and well-nourished. No distress.  HENT:  Head: Normocephalic and atraumatic.  Eyes: Conjunctivae are normal.  Neck: Neck supple.  Cardiovascular: Normal rate and regular rhythm.  No murmur heard. Pulmonary/Chest: Effort normal and breath sounds normal. No respiratory distress.  Abdominal: Soft. There is no tenderness.  Musculoskeletal: She exhibits edema.  1+ pitting edema to bilateral lower extremities,  extending to shin.  Cap refill less than 2 seconds, dorsalis pedis 2+.  Neurological: She is alert.  Skin: Skin is warm and dry.  Psychiatric: She has a normal mood and affect.  Nursing note and vitals reviewed.    UC Treatments / Results  Labs (all labs ordered are listed, but only abnormal results are displayed) Labs Reviewed - No data to display  EKG None  Radiology No results found.  Procedures Procedures (including critical care time)  Medications Ordered in  UC Medications - No data to display  Initial Impression / Assessment and Plan / UC Course  I have reviewed the triage vital signs and the nursing notes.  Pertinent labs & imaging results that were available during my care of the patient were reviewed by me and considered in my medical decision making (see chart for details).     Patient appears to have fluid on bilateral lower extremities, will provide Lasix for 3 to 4 days, advised to discontinue after 3 days if swelling returning to normal.  Discussed that lower leg swelling could be related to amlodipine.  Will have patient follow-up with PCP to discuss switching amlodipine.  Patient does not wish to switch blood pressure medicine at this time.  Discussed alternative of switching to hydrochlorothiazide instead of doing Lasix.  Patient opted for a couple days of Lasix and continuing amlodipine.  Discussed that this is less likely related to a tickborne illness given she did not have an actual bite and proximity of symptoms to onset.  Discussed that tickborne illnesses often have longer incubation periods and do not present so soon.  Will have patient continue to monitor blood pressure at home.  Given this bilateral, less likely related to DVT.  Final Clinical Impressions(s) / UC Diagnoses   Final diagnoses:  Bilateral lower extremity edema     Discharge Instructions     Please take lasix for 3-4 days  Follow up with PCP for discussion about swelling/possibly of  relation to amlodipine  Monitor BP at home  Elevate legs when resting, compression stockings to the knee   ED Prescriptions    Medication Sig Dispense Auth. Provider   furosemide (LASIX) 20 MG tablet Take 1 tablet (20 mg total) by mouth daily for 4 days. 5 tablet Iara Monds C, PA-C     Controlled Substance Prescriptions [place] Controlled Substance Registry consulted? Not Applicable   Janith Lima, [place] 07/13/17 [phone]

## 2017-07-16 ENCOUNTER — Encounter (INDEPENDENT_AMBULATORY_CARE_PROVIDER_SITE_OTHER): Payer: Commercial Managed Care - HMO | Admitting: Ophthalmology

## 2017-07-18 ENCOUNTER — Ambulatory Visit: Payer: 59 | Admitting: Gastroenterology

## 2017-09-10 ENCOUNTER — Ambulatory Visit: Payer: 59 | Admitting: Family Medicine

## 2017-09-10 ENCOUNTER — Encounter: Payer: Self-pay | Admitting: Family Medicine

## 2017-09-10 VITALS — BP 144/84 | HR 87 | Resp 16 | Ht 65.0 in | Wt 195.0 lb

## 2017-09-10 DIAGNOSIS — E119 Type 2 diabetes mellitus without complications: Secondary | ICD-10-CM

## 2017-09-10 DIAGNOSIS — I10 Essential (primary) hypertension: Secondary | ICD-10-CM | POA: Diagnosis not present

## 2017-09-10 DIAGNOSIS — E11319 Type 2 diabetes mellitus with unspecified diabetic retinopathy without macular edema: Secondary | ICD-10-CM | POA: Diagnosis not present

## 2017-09-10 DIAGNOSIS — Z1231 Encounter for screening mammogram for malignant neoplasm of breast: Secondary | ICD-10-CM | POA: Diagnosis not present

## 2017-09-10 DIAGNOSIS — E785 Hyperlipidemia, unspecified: Secondary | ICD-10-CM | POA: Diagnosis not present

## 2017-09-10 DIAGNOSIS — IMO0001 Reserved for inherently not codable concepts without codable children: Secondary | ICD-10-CM

## 2017-09-10 DIAGNOSIS — Z794 Long term (current) use of insulin: Secondary | ICD-10-CM

## 2017-09-10 DIAGNOSIS — E1165 Type 2 diabetes mellitus with hyperglycemia: Secondary | ICD-10-CM | POA: Diagnosis not present

## 2017-09-10 DIAGNOSIS — E669 Obesity, unspecified: Secondary | ICD-10-CM

## 2017-09-10 NOTE — Patient Instructions (Addendum)
F/U in 4 months , call if you need me before  You will get a message re labs  Cut back insulin dose and start meformin to help with weight loss, also increase walking to 3 miles per day  Microalb, HBA1C, chem 7 and EGFr, CBC, vit D, fastin g lipids

## 2017-09-11 ENCOUNTER — Encounter: Payer: Self-pay | Admitting: Family Medicine

## 2017-09-11 LAB — COMPLETE METABOLIC PANEL WITH GFR
AG RATIO: 1.1 (calc) (ref 1.0–2.5)
ALKALINE PHOSPHATASE (APISO): 76 U/L (ref 33–130)
ALT: 15 U/L (ref 6–29)
AST: 16 U/L (ref 10–35)
Albumin: 4.3 g/dL (ref 3.6–5.1)
BILIRUBIN TOTAL: 0.4 mg/dL (ref 0.2–1.2)
BUN: 12 mg/dL (ref 7–25)
CHLORIDE: 102 mmol/L (ref 98–110)
CO2: 28 mmol/L (ref 20–32)
Calcium: 10.2 mg/dL (ref 8.6–10.4)
Creat: 0.74 mg/dL (ref 0.50–1.05)
GFR, Est African American: 106 mL/min/{1.73_m2} (ref >=60)
GFR, Est Non African American: 92 mL/min/{1.73_m2} (ref >=60)
Globulin: 3.8 g/dL (calc) — ABNORMAL HIGH (ref 1.9–3.7)
Glucose, Bld: 65 mg/dL (ref 65–99)
POTASSIUM: 3.9 mmol/L (ref 3.5–5.3)
Sodium: 139 mmol/L (ref 135–146)
TOTAL PROTEIN: 8.1 g/dL (ref 6.1–8.1)

## 2017-09-11 LAB — LIPID PANEL
Cholesterol: 225 mg/dL — ABNORMAL HIGH (ref <200)
HDL: 57 mg/dL (ref >50)
LDL CHOLESTEROL (CALC): 145 mg/dL — AB
NON-HDL CHOLESTEROL (CALC): 168 mg/dL — AB (ref <130)
TRIGLYCERIDES: 117 mg/dL (ref <150)
Total CHOL/HDL Ratio: 3.9 (calc) (ref <5.0)

## 2017-09-11 LAB — HEMOGLOBIN A1C
EAG (MMOL/L): 9 (calc)
Hgb A1c MFr Bld: 7.3 % of total Hgb — ABNORMAL HIGH (ref <5.7)
MEAN PLASMA GLUCOSE: 163 (calc)

## 2017-09-15 ENCOUNTER — Telehealth: Payer: Self-pay | Admitting: Family Medicine

## 2017-09-15 MED ORDER — METFORMIN HCL 500 MG PO TABS: 500.0000 mg | ORAL_TABLET | Freq: Two times a day (BID) | ORAL | 3 refills | Status: DC

## 2017-09-15 MED ORDER — ROSUVASTATIN CALCIUM 5 MG PO TABS: 5.0000 mg | ORAL_TABLET | Freq: Every day | ORAL | 5 refills | Status: DC

## 2017-09-15 NOTE — Assessment & Plan Note (Signed)
Unchanged Caitlin Thompson is reminded of the importance of commitment to daily physical activity for 30 minutes or more, as able and the need to limit carbohydrate intake to 30 to 60 grams per meal to help with blood sugar control.   The need to take medication as prescribed, test blood sugar as directed, and to call between visits if there is a concern that blood sugar is uncontrolled is also discussed.   Caitlin Thompson is reminded of the importance of daily foot exam, annual eye examination, and good blood sugar, blood pressure and cholesterol control.  Diabetic Labs Latest Ref Rng & Units 09/10/2017 05/04/2017 01/05/2017 10/05/2016 09/27/2016  HbA1c <5.7 % of total Hgb 7.3(H) 7.3(H) 7.5(H) - 12.6(H)  Microalbumin Not Estab. ug/mL - - - 115.4(H) -  Micro/Creat Ratio 0.0 - 30.0 mg/g creat - - - 231.7(H) -  Chol <200 mg/dL 225(H) - - - -  HDL >50 mg/dL 57 - - - -  Calc LDL mg/dL (calc) 145(H) - - - -  Triglycerides <150 mg/dL 117 - - - -  Creatinine 0.50 - 1.05 mg/dL 0.74 0.77 0.87 - 0.77   BP/Weight 09/10/2017 07/12/2017 07/11/2017 05/08/2017 01/08/2017 10/05/2016 2/82/0601  Systolic BP 561 537 943 276 147 092 957  Diastolic BP 84 91 98 82 94 82 82  Wt. (Lbs) 195 180 - 188 192 175 176.4  BMI 32.45 29.95 - 31.28 34.01 31 31.25   Foot/eye exam completion dates Latest Ref Rng & Units 01/08/2017 07/07/2014  Eye Exam No Retinopathy - -  Foot exam Order - - -  Foot Form Completion - Done Done

## 2017-09-15 NOTE — Telephone Encounter (Signed)
Pt has not acknowledged the result note. She has medication changes. Pls let her know I have sent in crestor 5 mg one at bedtime for her cholesterol Also I recommend staying on the SAME dose of insulin and have sent in metformin 500 mg one twice daily. ( which is new for her to add on, we discussed this )  She needs 1 week before her next visit to get these labs pls order and mail to her   Fasting lipid, cmp and EGFr and HBA1C and microalb Thank YOU!

## 2017-09-15 NOTE — Progress Notes (Signed)
Caitlin Thompson     MRN: [id number]      DOB: [birth year]   HPI Caitlin Thompson is here for follow up and re-evaluation of chronic medical conditions, medication management and review of any available recent lab and radiology data.  Preventive health is updated, specifically  Cancer screening and Immunization.   Questions or concerns regarding consultations or procedures which the PT has had in the interim are  addressed. The PT denies any adverse reactions to current medications since the last visit.  There are no new concerns.  There are no specific complaints   ROS Denies recent fever or chills. Denies sinus pressure, nasal congestion, ear pain or sore throat. Denies chest congestion, productive cough or wheezing. Denies chest pains, palpitations and leg swelling Denies abdominal pain, nausea, vomiting,diarrhea or constipation.   Denies dysuria, frequency, hesitancy or incontinence. Denies joint pain, swelling and limitation in mobility. Denies headaches, seizures, numbness, or tingling. Denies depression, anxiety or insomnia. Denies skin break down or rash.   PE  BP (!) 144/84   Pulse 87   Resp 16   Ht 5\' 5"  (1.651 m)   Wt 195 lb (88.5 kg)   SpO2 96%   BMI 32.45 kg/m   Patient alert and oriented and in no cardiopulmonary distress.  HEENT: No facial asymmetry, EOMI,   oropharynx pink and moist.  Neck supple no JVD, no mass.  Chest: Clear to auscultation bilaterally.  CVS: S1, S2 no murmurs, no S3.Regular rate.  ABD: Soft non tender.   Ext: No edema  MS: Adequate ROM spine, shoulders, hips and knees.  Skin: Intact, no ulcerations or rash noted.  Psych: Good eye contact, normal affect. Memory intact not anxious or depressed appearing.  CNS: CN 2-12 intact, power,  normal throughout.no focal deficits noted.   Assessment & Plan  Essential hypertension Not at goal, patient is not taking the amlodipine consistently, she is reminded of the need to dos so DASH  diet and commitment to daily physical activity for a minimum of 30 minutes discussed and encouraged, as a part of hypertension management. The importance of attaining a healthy weight is also discussed.  BP/Weight 09/10/2017 07/12/2017 07/11/2017 05/08/2017 01/08/2017 10/05/2016 4/50/3888  Systolic BP 280 034 917 915 056 979 480  Diastolic BP 84 91 98 82 94 82 82  Wt. (Lbs) 195 180 - 188 192 175 176.4  BMI 32.45 29.95 - 31.28 34.01 31 31.25       Diabetes mellitus, insulin dependent (IDDM), controlled (HCC) Unchanged Caitlin Thompson is reminded of the importance of commitment to daily physical activity for 30 minutes or more, as able and the need to limit carbohydrate intake to 30 to 60 grams per meal to help with blood sugar control.   The need to take medication as prescribed, test blood sugar as directed, and to call between visits if there is a concern that blood sugar is uncontrolled is also discussed.   Caitlin Thompson is reminded of the importance of daily foot exam, annual eye examination, and good blood sugar, blood pressure and cholesterol control.  Diabetic Labs Latest Ref Rng & Units 09/10/2017 05/04/2017 01/05/2017 10/05/2016 09/27/2016  HbA1c <5.7 % of total Hgb 7.3(H) 7.3(H) 7.5(H) - 12.6(H)  Microalbumin Not Estab. ug/mL - - - 115.4(H) -  Micro/Creat Ratio 0.0 - 30.0 mg/g creat - - - 231.7(H) -  Chol <200 mg/dL 225(H) - - - -  HDL >50 mg/dL 57 - - - -  Calc LDL mg/dL (calc)  145(H) - - - -  Triglycerides <150 mg/dL 117 - - - -  Creatinine 0.50 - 1.05 mg/dL 0.74 0.77 0.87 - 0.77   BP/Weight 09/10/2017 07/12/2017 07/11/2017 05/08/2017 01/08/2017 10/05/2016 3/66/2947  Systolic BP 654 650 354 656 812 751 700  Diastolic BP 84 91 98 82 94 82 82  Wt. (Lbs) 195 180 - 188 192 175 176.4  BMI 32.45 29.95 - 31.28 34.01 31 31.25   Foot/eye exam completion dates Latest Ref Rng & Units 01/08/2017 07/07/2014  Eye Exam No Retinopathy - -  Foot exam Order - - -  Foot Form Completion - Done Done         Hyperlipidemia LDL goal <100 Hyperlipidemia:Low fat diet discussed and encouraged.   Lipid Panel  Lab Results  Component Value Date   CHOL 225 (H) 09/10/2017   HDL 57 09/10/2017   LDLCALC 145 (H) 09/10/2017   TRIG 117 09/10/2017   CHOLHDL 3.9 09/10/2017   Not at goal, pt needs to take statin and lower fat intake Updated lab needed at/ before next visit.    Obesity (BMI 30.0-34.9) Deteriorated. Patient re-educated about  the importance of commitment to a  minimum of 150 minutes of exercise per week.  The importance of healthy food choices with portion control discussed. Encouraged to start a food diary, count calories and to consider  joining a support group. Sample diet sheets offered. Goals set by the patient for the next several months.   Weight /BMI 09/10/2017 07/12/2017 05/08/2017  WEIGHT 195 lb 180 lb 188 lb  HEIGHT 5\' 5"  - 5\' 5"   BMI 32.45 kg/m2 29.95 kg/m2 31.28 kg/m2

## 2017-09-15 NOTE — Assessment & Plan Note (Signed)
Hyperlipidemia:Low fat diet discussed and encouraged.   Lipid Panel  Lab Results  Component Value Date   CHOL 225 (H) 09/10/2017   HDL 57 09/10/2017   LDLCALC 145 (H) 09/10/2017   TRIG 117 09/10/2017   CHOLHDL 3.9 09/10/2017   Not at goal, pt needs to take statin and lower fat intake Updated lab needed at/ before next visit.

## 2017-09-15 NOTE — Assessment & Plan Note (Signed)
Not at goal, patient is not taking the amlodipine consistently, she is reminded of the need to [facility] so DASH diet and commitment to daily physical activity for a minimum of 30 minutes discussed and encouraged, as a part of hypertension management. The importance of attaining a healthy weight is also discussed.  BP/Weight 09/10/2017 07/12/2017 07/11/2017 05/08/2017 01/08/2017 10/05/2016 5/99/7741  Systolic BP 423 953 202 334 356 861 683  Diastolic BP 84 91 98 82 94 82 82  Wt. (Lbs) 195 180 - 188 192 175 176.4  BMI 32.45 29.95 - 31.28 34.01 31 31.25

## 2017-09-15 NOTE — Assessment & Plan Note (Signed)
Deteriorated. Patient re-educated about  the importance of commitment to a  minimum of 150 minutes of exercise per week.  The importance of healthy food choices with portion control discussed. Encouraged to start a food diary, count calories and to consider  joining a support group. Sample diet sheets offered. Goals set by the patient for the next several months.   Weight /BMI 09/10/2017 07/12/2017 05/08/2017  WEIGHT 195 lb 180 lb 188 lb  HEIGHT 5\' 5"  - 5\' 5"   BMI 32.45 kg/m2 29.95 kg/m2 31.28 kg/m2

## 2017-09-17 NOTE — Telephone Encounter (Signed)
Patient is aware 

## 2017-10-10 ENCOUNTER — Encounter: Payer: Self-pay | Admitting: Nurse Practitioner

## 2017-10-10 ENCOUNTER — Ambulatory Visit: Payer: 59 | Admitting: Nurse Practitioner

## 2017-10-10 NOTE — Progress Notes (Deleted)
Primary Care Physician:  Fayrene Helper, MD Primary Gastroenterologist:  Dr.   Rayne Du chief complaint on file.   HPI:   Caitlin Thompson is a 57 y.o. female who presents in referral from primary care for GERD and history of colon adenoma.  We have not seen the patient since 2013.  Reviewed information provided with the referral including ***.  Last colonoscopy and EGD completed 05/17/2011.  EGD found incomplete noncritical Schatzki's ring status post dilation, hiatal hernia, remainder of the exam normal.  Recommended solid-phase gastric emptying study to further evaluate upper GI tract symptoms.  Colonoscopy the same day found adequate preparation, anal papilla, 7 mm polyp in the base of the cecum, otherwise normal exam.  Status post segmental biopsy.  Surgical pathology found the polyps to be tubular adenoma and the biopsies to be benign colon mucosa.  Stool studies were negative for infectious etiology.  Recommended repeat colonoscopy in 5 years.   Gastric emptying study completed 05/19/2011 which was a normal study with 5% retention at 120 minutes.  Today she states   Past Medical History:  Diagnosis Date  . Adenomatous colon polyp 06/26/2011   05/17/11 Colonoscopy Dr Chauncy Lean adenoma Next colonoscopy 05/2016   . DERMATOMYCOSIS 05/23/2010   Qualifier: Diagnosis of  By: Moshe Cipro MD, Joycelyn Schmid    . Diabetes mellitus type II 2007  . GERD (gastroesophageal reflux disease) 04/03/2011   Non-critical Schatzki's ring, HH on EGD 05/17/11 Dr Gala Romney   . Hematuria   . HYDRONEPHROSIS, LEFT 08/30/2009   Qualifier: Diagnosis of  By: Claybon Jabs PA, Dawn    . HYPERLIPIDEMIA 12/01/2009   Qualifier: Diagnosis of  By: Claybon Jabs PA, [place]    . Hypertension   . Obesity (BMI 30.0-34.9) 07/15/2011  . Renal lithiasis 6/11   Hospitalized a [place] [facility] 3 days in June   . Schatzki's ring   . THYROID STIMULATING HORMONE, ABNORMAL 08/30/2009   Qualifier: Diagnosis of  By: Claybon Jabs PA, Dawn    . Tubular adenoma  of colon 3/13    Past Surgical History:  Procedure Laterality Date  . COLONOSCOPY W/ POLYPECTOMY  05/17/11   Rourk-Tubular adenoma colon, benign SB bx  . ESOPHAGEAL DILATION  05/17/2011   Rourk-Hiatal hernia/Incomplete noncritical Schatzki's ring/HH  . WISDOM TOOTH EXTRACTION      Current Outpatient Medications  Medication Sig Dispense Refill  . amLODipine (NORVASC) 5 MG tablet Take 1 tablet (5 mg total) by mouth daily. 30 tablet 5  . furosemide (LASIX) 20 MG tablet Take 1 tablet (20 mg total) by mouth daily for 4 days. 5 tablet 0  . Insulin Glargine (BASAGLAR KWIKPEN) 100 UNIT/ML SOPN INJECT 70 UNITS SUBCUTANEOUSLY ONCE DAILY 15 mL 5  . metFORMIN (GLUCOPHAGE) 500 MG tablet Take 1 tablet (500 mg total) by mouth 2 (two) times daily with a meal. 180 tablet 3  . ONETOUCH VERIO test strip USE ONE STRIP TO CHECK GLUCOSE THREE TIMES DAILY 100 each 23  . ONETOUCH VERIO test strip USE ONE STRIP TO CHECK GLUCOSE THREE TIMES DAILY 100 each 23  . rosuvastatin (CRESTOR) 5 MG tablet Take 1 tablet (5 mg total) by mouth daily. 30 tablet 5   No current facility-administered medications for this visit.     Allergies as of 10/10/2017 - Review Complete 09/10/2017  Allergen Reaction Noted  . Codeine    . Fish allergy Hives 05/15/2011  . Parlodel [bromocriptine mesylate]  06/14/2012  . Penicillins Hives 10/09/2011  . Sulfonamide derivatives Hives  Family History  Problem Relation Age of Onset  . Diabetes Mother   . Hypertension Mother   . Kidney failure Mother   . Diabetes Father   . Hypertension Father   . Kidney failure Father   . Diabetes Brother        x2  . Crohn's disease Sister   . Aneurysm Sister     Social History   Socioeconomic History  . Marital status: Single    Spouse name: Not on file  . Number of children: 1  . Years of education: Not on file  . Highest education level: Not on file  Occupational History  . 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 Financial resource strain: Not on file  . Food insecurity:    Worry: Not on file    Inability: Not on file  . Transportation needs:    Medical: Not on file    Non-medical: Not on file  Tobacco Use  . Smoking status: Never Smoker  . Smokeless tobacco: Never Used  Substance and Sexual Activity  . Alcohol use: No  . Drug use: No  . Sexual activity: Yes    Birth control/protection: None  Lifestyle  . Physical activity:    Days per week: Not on file    Minutes per session: Not on file  . Stress: Not on file  Relationships  . Social connections:    Talks on phone: Not on file    Gets together: Not on file    Attends religious service: Not on file    Active member of club or organization: Not on file    Attends meetings of clubs or organizations: Not on file    Relationship status: Not on file  . Intimate partner violence:    Fear of current or ex partner: Not on file    Emotionally abused: Not on file    Physically abused: Not on file    Forced sexual activity: Not on file  Other Topics Concern  . Not on file  Social History Narrative   1 GROWN DAUGHTER    Review of Systems: General: Negative for anorexia, weight loss, fever, chills, fatigue, weakness. Eyes: Negative for vision changes.  ENT: Negative for hoarseness, difficulty swallowing , nasal congestion. CV: Negative for chest pain, angina, palpitations, dyspnea on exertion, peripheral edema.  Respiratory: Negative for dyspnea at rest, dyspnea on exertion, cough, sputum, wheezing.  GI: See history of present illness. GU:  Negative for dysuria, hematuria, urinary incontinence, urinary frequency, nocturnal urination.  MS: Negative for joint pain, low back pain.  Derm: Negative for rash or itching.  Neuro: Negative for weakness, abnormal sensation, seizure, frequent headaches, memory loss, confusion.  Psych: Negative for anxiety, depression, suicidal  ideation, hallucinations.  Endo: Negative for unusual weight change.  Heme: Negative for bruising or bleeding. Allergy: Negative for rash or hives.    Physical Exam: There were no vitals taken for this visit. General:   Alert and oriented. Pleasant and cooperative. Well-nourished and well-developed.  Head:  Normocephalic and atraumatic. Eyes:  Without icterus, sclera clear and conjunctiva pink.  Ears:  Normal auditory acuity. Mouth:  No deformity or lesions, oral mucosa pink.  Throat/Neck:  Supple, without mass or thyromegaly. Cardiovascular:  S1, S2 present without murmurs appreciated. Normal pulses noted. Extremities without clubbing or edema. Respiratory:  Clear to auscultation bilaterally. No wheezes, rales, or rhonchi. No  distress.  Gastrointestinal:  +BS, soft, non-tender and non-distended. No HSM noted. No guarding or rebound. No masses appreciated.  Rectal:  Deferred  Musculoskalatal:  Symmetrical without gross deformities. Normal posture. Skin:  Intact without significant lesions or rashes. Neurologic:  Alert and oriented x4;  grossly normal neurologically. Psych:  Alert and cooperative. Normal mood and affect. Heme/Lymph/Immune: No significant cervical adenopathy. No excessive bruising noted.    10/10/2017 12:47 PM   Disclaimer: This note was dictated with voice recognition software. Similar sounding words can inadvertently be transcribed and may not be corrected upon review.

## 2017-11-05 ENCOUNTER — Other Ambulatory Visit: Payer: Self-pay | Admitting: Family Medicine

## 2017-11-11 IMAGING — DX DG FOOT COMPLETE 3+V*R*
3 series · 3 of 3 positions shown · non-contrast
Comparison: None in PACs

CLINICAL DATA: Painful swelling inflamed right great toe for the
past 2 weeks; history of diabetes

EXAM:
RIGHT FOOT COMPLETE - 3+ VIEW

[foot ap]
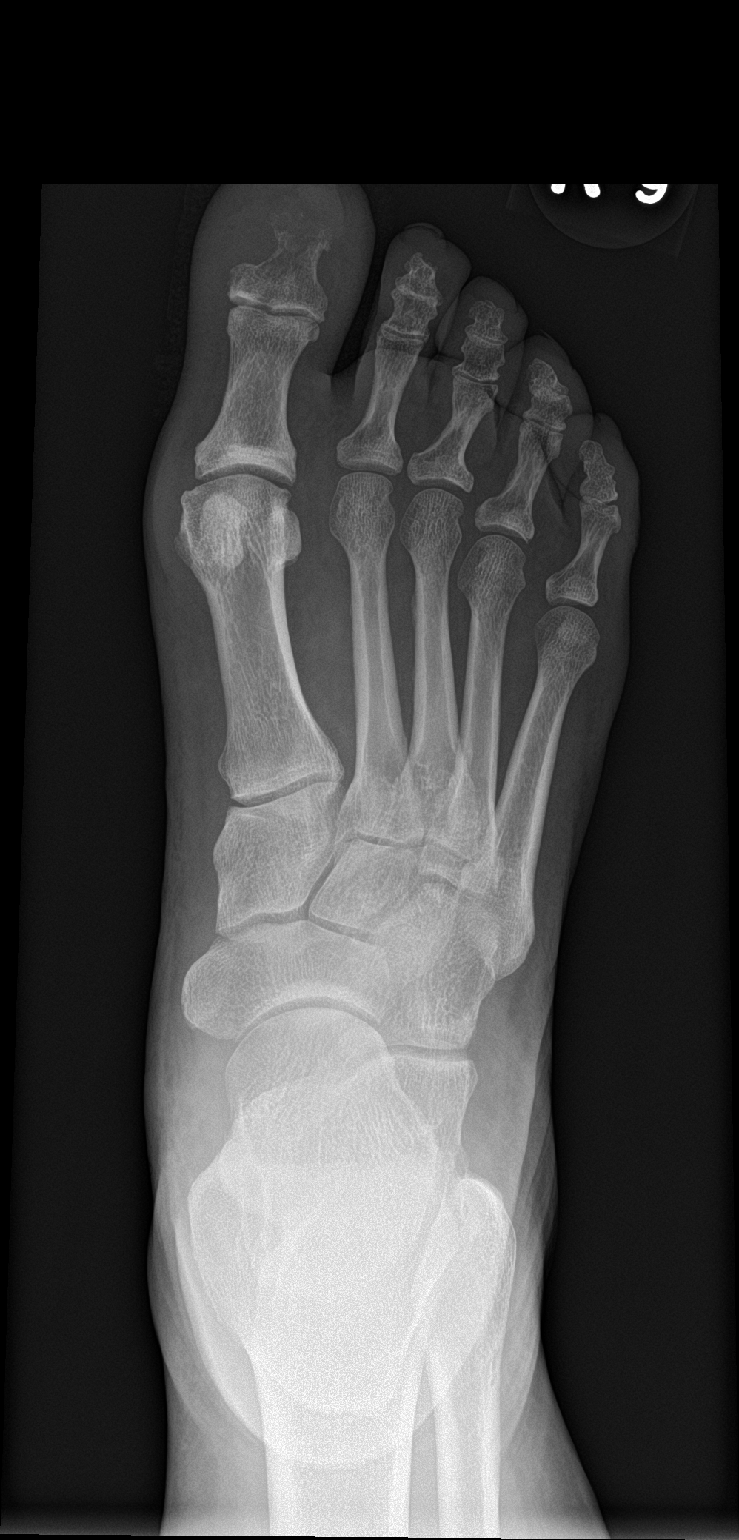

[foot obl]
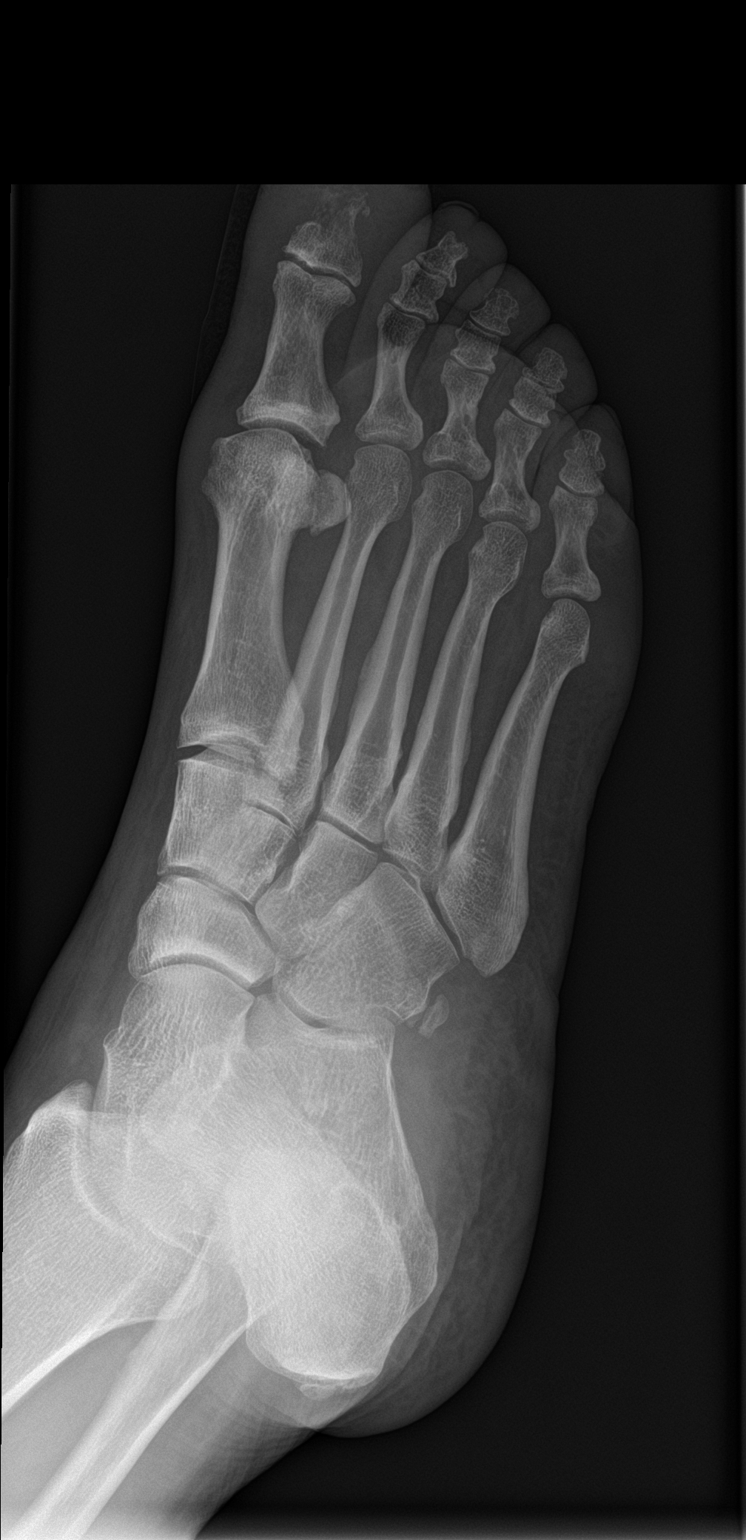

[foot lat]
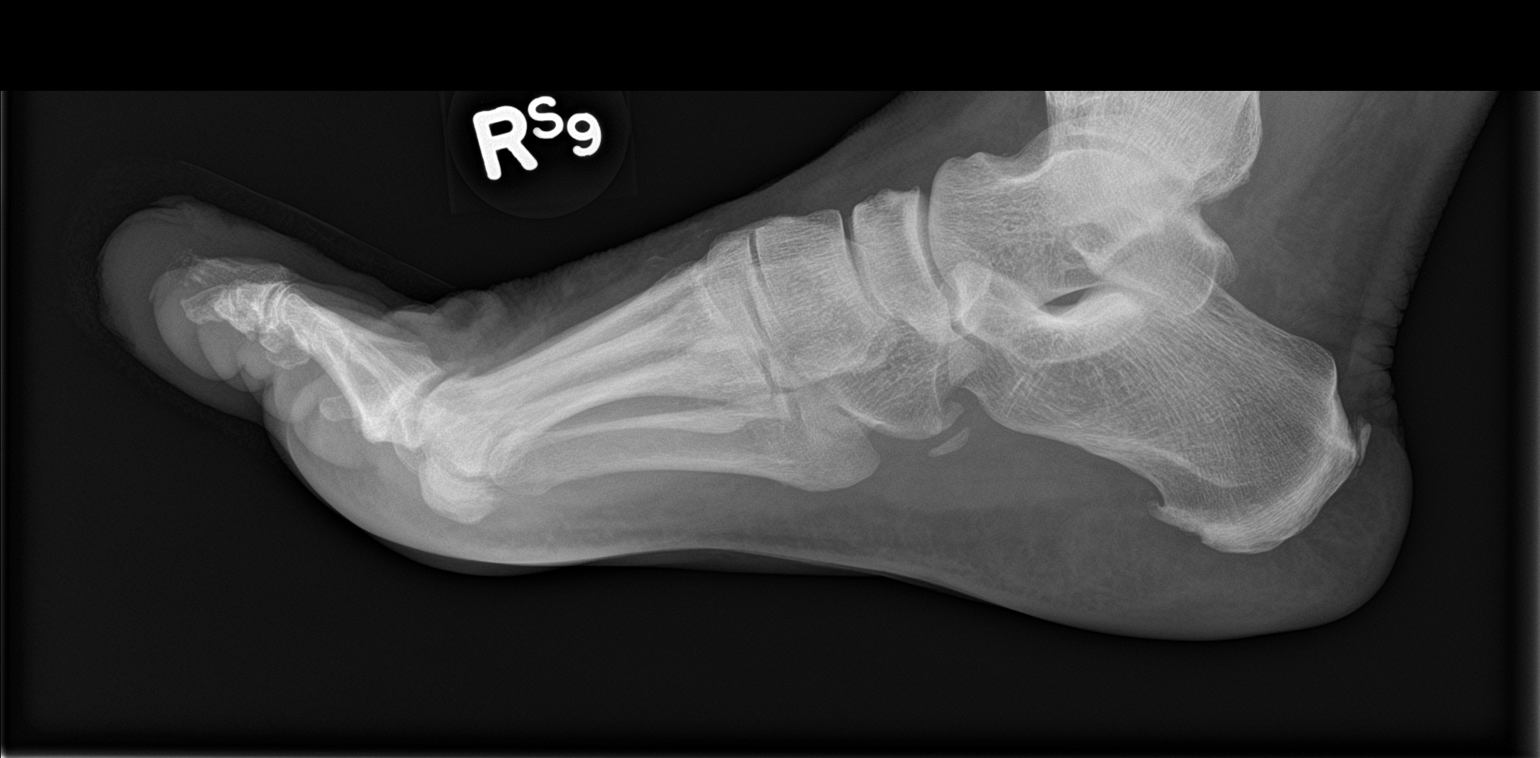

[3 of 3 positions shown; findings below may reference images not displayed]

FINDINGS: There is soft tissue swelling diffusely involving the great toe.
There is osteolysis of the tuft of the distal phalanx. The proximal
phalanx is intact. The IP and MTP joints are preserved. There is
some thinning of the soft tissues over the distal aspect of the toe
consistent with known ulceration.
IMPRESSION: Osteomyelitis of the tuft of the distal phalanx of the great toe
with surrounding cellulitis.

## 2017-11-25 IMAGING — CR DG CHEST 2V
1 series · 2 of 2 positions shown · non-contrast
Comparison: 07/07/2014.

CLINICAL DATA: Diabetic foot ulcer. Pre hyperbaric oxygen treatment
clearance.

EXAM:
CHEST  2 VIEW

[Series 1: dg chest 2 view · 0.14mm/px · 2 of 2 slices shown]
[im 1/2]
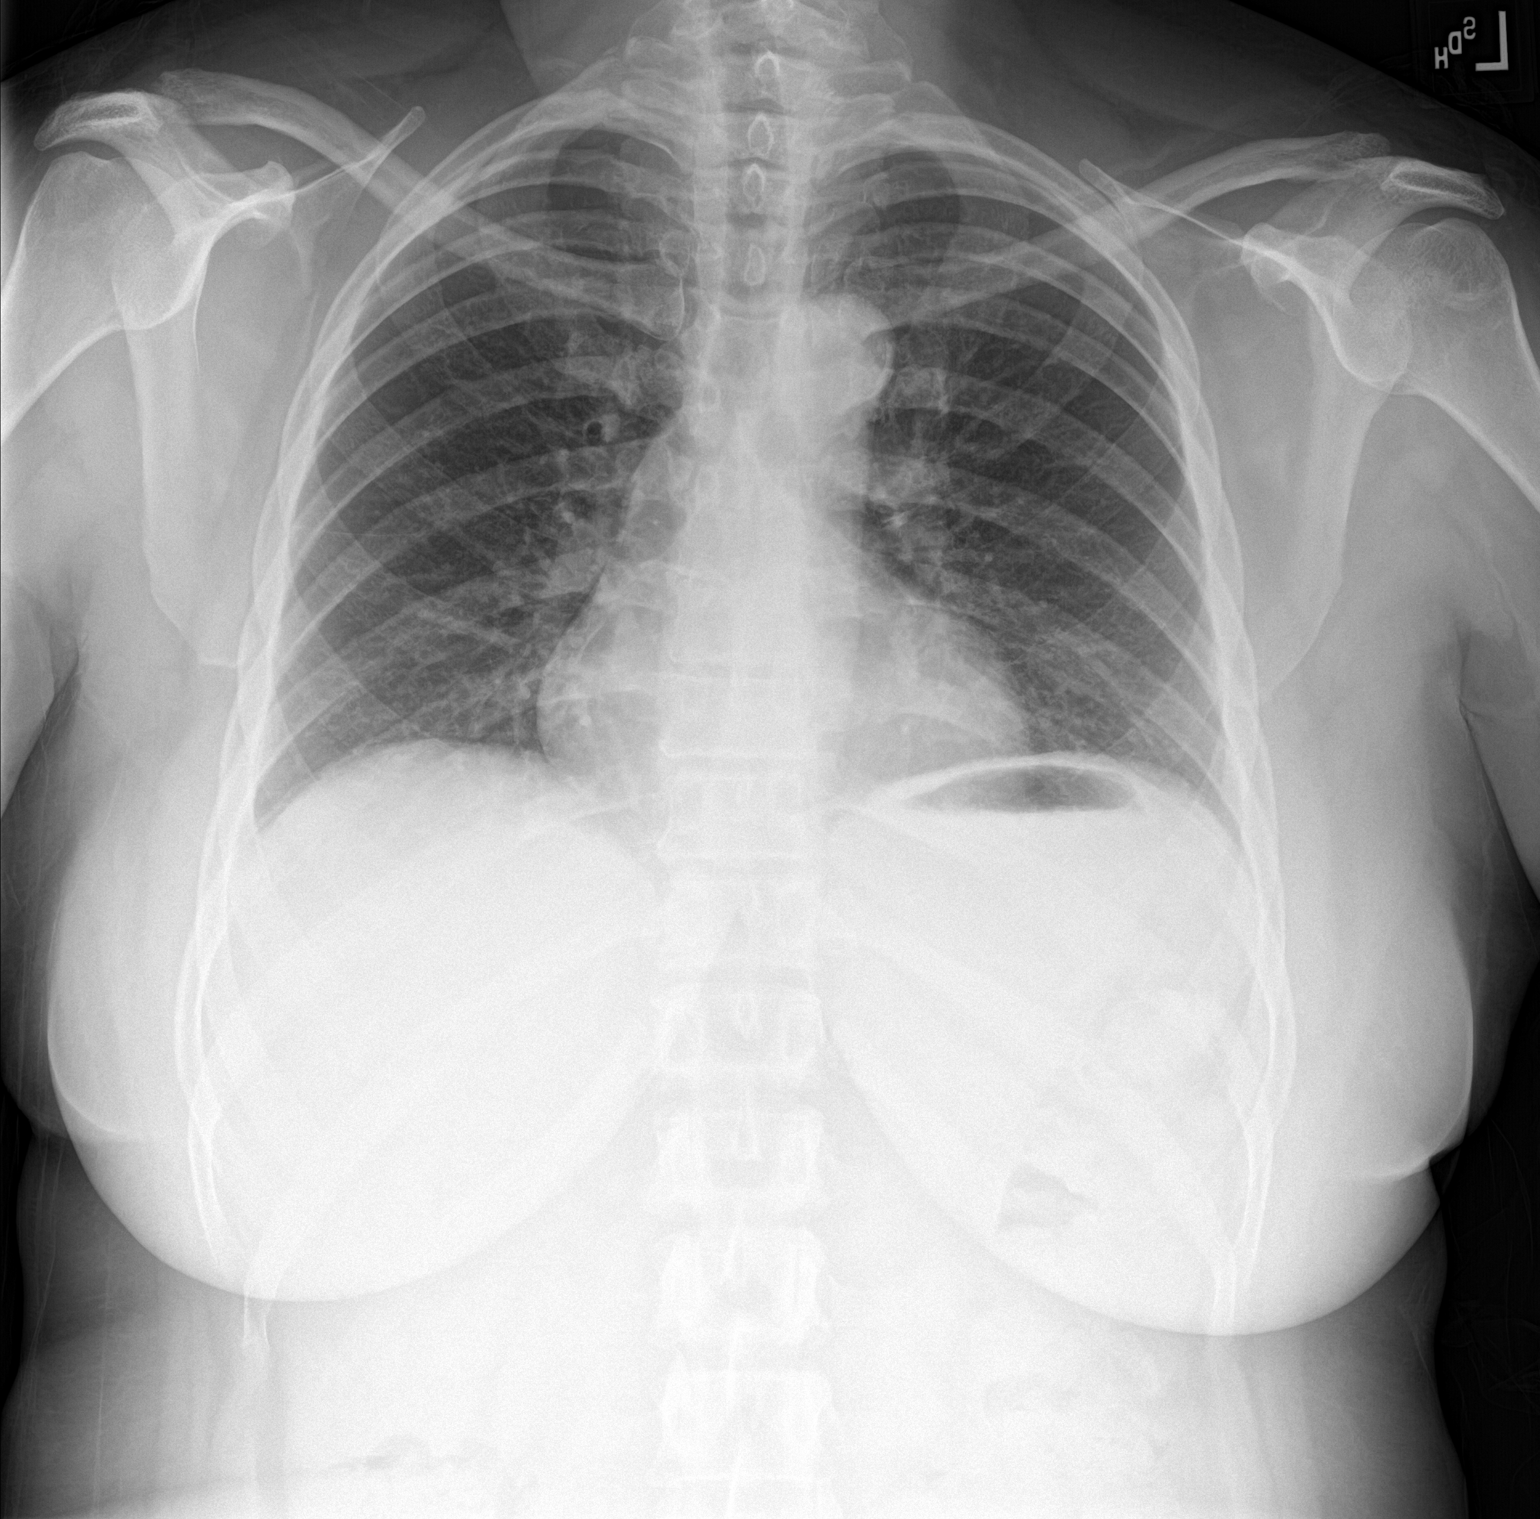
[im 2/2]
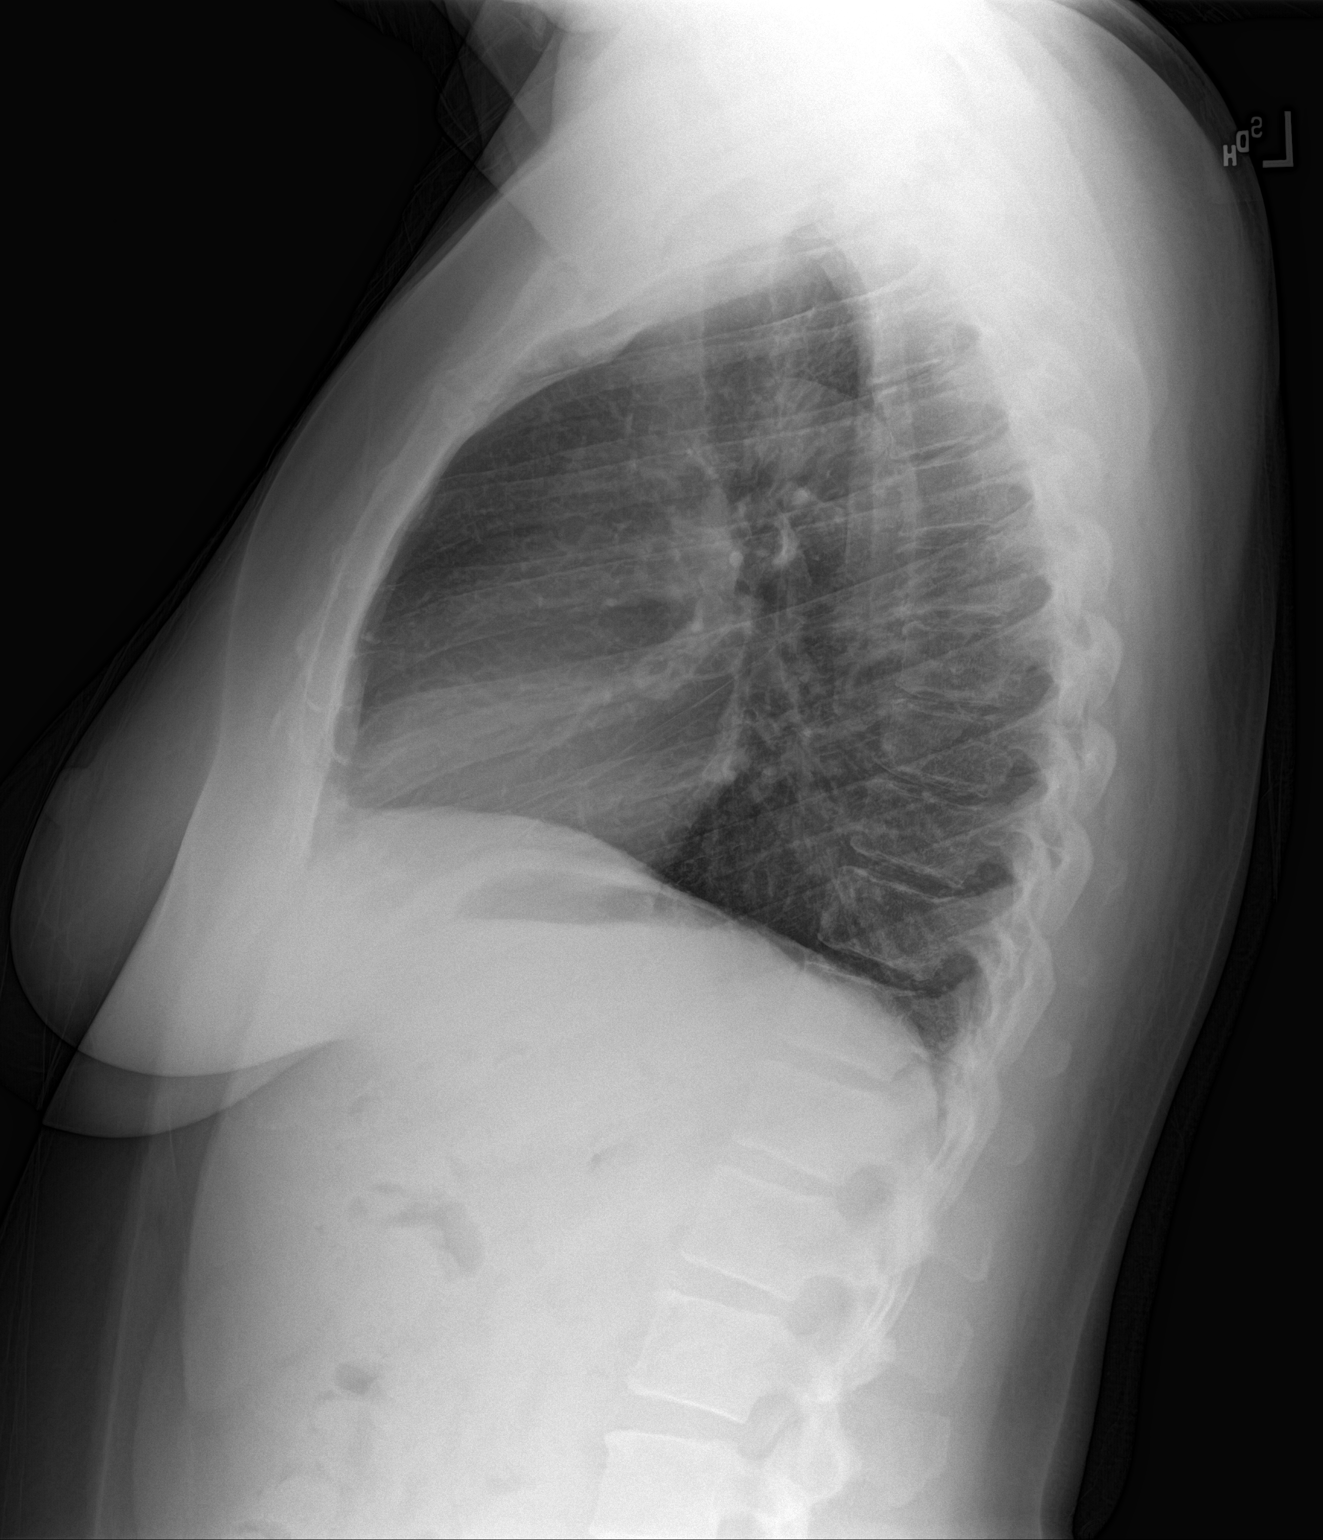

[2 of 2 positions shown; findings below may reference images not displayed]

FINDINGS: Normal sized heart. Clear lungs. Stable minimal diffuse
peribronchial thickening. Unremarkable bones.
IMPRESSION: No acute abnormality.  Stable minimal chronic bronchitic changes.

## 2017-12-23 DIAGNOSIS — Z23 Encounter for immunization: Secondary | ICD-10-CM | POA: Diagnosis not present

## 2018-01-10 ENCOUNTER — Telehealth: Payer: Self-pay | Admitting: Family Medicine

## 2018-01-10 DIAGNOSIS — E119 Type 2 diabetes mellitus without complications: Secondary | ICD-10-CM

## 2018-01-10 DIAGNOSIS — E785 Hyperlipidemia, unspecified: Secondary | ICD-10-CM

## 2018-01-10 DIAGNOSIS — I10 Essential (primary) hypertension: Secondary | ICD-10-CM

## 2018-01-10 DIAGNOSIS — Z794 Long term (current) use of insulin: Secondary | ICD-10-CM

## 2018-01-10 DIAGNOSIS — IMO0001 Reserved for inherently not codable concepts without codable children: Secondary | ICD-10-CM

## 2018-01-10 NOTE — Telephone Encounter (Signed)
Microalb, HBA1C, chem 7 and EGFr, CBC, vit D, fastin g lipids Please put labs into [facility]--for today

## 2018-01-10 NOTE — Telephone Encounter (Signed)
Done

## 2018-01-10 NOTE — Addendum Note (Signed)
Addended by: Eual Fines on: 01/10/2018 03:03 PM   Modules accepted: Orders

## 2018-01-11 ENCOUNTER — Other Ambulatory Visit (HOSPITAL_COMMUNITY)
Admission: RE | Admit: 2018-01-11 | Discharge: 2018-01-11 | Disposition: A | Discharge status: Home / Self Care | Payer: 59 | Source: Ambulatory Visit | Attending: Family Medicine | Admitting: Family Medicine

## 2018-01-11 DIAGNOSIS — Z794 Long term (current) use of insulin: Secondary | ICD-10-CM | POA: Diagnosis not present

## 2018-01-11 DIAGNOSIS — E119 Type 2 diabetes mellitus without complications: Secondary | ICD-10-CM | POA: Insufficient documentation

## 2018-01-11 LAB — CBC
HCT: 39 % (ref 36.0–46.0)
Hemoglobin: 11.9 g/dL — ABNORMAL LOW (ref 12.0–15.0)
MCH: 30.1 pg (ref 26.0–34.0)
MCHC: 30.5 g/dL (ref 30.0–36.0)
MCV: 98.7 fL (ref 80.0–100.0)
PLATELETS: 295 10*3/uL (ref 150–400)
RBC: 3.95 MIL/uL (ref 3.87–5.11)
RDW: 12.2 % (ref 11.5–15.5)
WBC: 8.4 10*3/uL (ref 4.0–10.5)
nRBC: 0 % (ref 0.0–0.2)

## 2018-01-11 LAB — BASIC METABOLIC PANEL
Anion gap: 8 (ref 5–15)
BUN: 12 mg/dL (ref 6–20)
CALCIUM: 9.1 mg/dL (ref 8.9–10.3)
CO2: 24 mmol/L (ref 22–32)
Chloride: 107 mmol/L (ref 98–111)
Creatinine, Ser: 0.68 mg/dL (ref 0.44–1.00)
GFR calc Af Amer: >60 mL/min (ref >60)
GLUCOSE: 88 mg/dL (ref 70–99)
Potassium: 3.5 mmol/L (ref 3.5–5.1)
Sodium: 139 mmol/L (ref 135–145)

## 2018-01-11 LAB — HEMOGLOBIN A1C
HEMOGLOBIN A1C: 7.2 % — AB (ref 4.8–5.6)
Mean Plasma Glucose: 159.94 mg/dL

## 2018-01-11 LAB — LIPID PANEL
CHOL/HDL RATIO: 3.8 ratio
CHOLESTEROL: 208 mg/dL — AB (ref 0–200)
HDL: 55 mg/dL (ref >40)
LDL CALC: 125 mg/dL — AB (ref 0–99)
TRIGLYCERIDES: 142 mg/dL (ref <150)
VLDL: 28 mg/dL (ref 0–40)

## 2018-01-12 LAB — VITAMIN D 25 HYDROXY (VIT D DEFICIENCY, FRACTURES): VIT D 25 HYDROXY: 6.3 ng/mL — AB (ref 30.0–100.0)

## 2018-01-15 ENCOUNTER — Encounter: Payer: Self-pay | Admitting: Family Medicine

## 2018-01-15 ENCOUNTER — Ambulatory Visit (INDEPENDENT_AMBULATORY_CARE_PROVIDER_SITE_OTHER): Payer: 59 | Admitting: Family Medicine

## 2018-01-15 VITALS — BP 142/84 | HR 97 | Resp 16 | Ht 65.0 in | Wt 198.0 lb

## 2018-01-15 DIAGNOSIS — Z794 Long term (current) use of insulin: Secondary | ICD-10-CM

## 2018-01-15 DIAGNOSIS — I10 Essential (primary) hypertension: Secondary | ICD-10-CM

## 2018-01-15 DIAGNOSIS — E119 Type 2 diabetes mellitus without complications: Secondary | ICD-10-CM | POA: Diagnosis not present

## 2018-01-15 DIAGNOSIS — E669 Obesity, unspecified: Secondary | ICD-10-CM

## 2018-01-15 DIAGNOSIS — R8290 Unspecified abnormal findings in urine: Secondary | ICD-10-CM

## 2018-01-15 DIAGNOSIS — E785 Hyperlipidemia, unspecified: Secondary | ICD-10-CM

## 2018-01-15 DIAGNOSIS — IMO0001 Reserved for inherently not codable concepts without codable children: Secondary | ICD-10-CM

## 2018-01-15 MED ORDER — CIPROFLOXACIN HCL 500 MG PO TABS: 500.0000 mg | ORAL_TABLET | Freq: Two times a day (BID) | ORAL | 0 refills | Status: DC

## 2018-01-15 MED ORDER — ERGOCALCIFEROL 1.25 MG (50000 UT) PO CAPS: 50000.0000 [IU] | ORAL_CAPSULE | ORAL | 3 refills | Status: DC

## 2018-01-15 MED ORDER — FLUCONAZOLE 150 MG PO TABS: ORAL_TABLET | ORAL | 0 refills | Status: DC

## 2018-01-15 NOTE — Progress Notes (Signed)
Sent to [facility] to add on by [facility]

## 2018-01-15 NOTE — Patient Instructions (Addendum)
F/U in 4 months, with shingrix #2 call if you need me before Mammogram appt before she leaves pls  Shingrix #1 today, not available  Microalb and urine c/s please give pt container with request she will leave in the lab  CONGRATS on good blood sugar and improved cholesterol Once weekly vit D and 1 week of ciprofloxacin and fluconazole is prescribed for UTI ( presumed)   HBa1c , cmp and eGFR, fasting lipid 1 week before next visit

## 2018-01-20 DIAGNOSIS — Z5320 Procedure and treatment not carried out because of patient's decision for unspecified reasons: Secondary | ICD-10-CM | POA: Insufficient documentation

## 2018-01-20 NOTE — Assessment & Plan Note (Signed)
Nearly at goal , has severe drug allergy, unknown cause , no change in medication DASH diet and commitment to daily physical activity for a minimum of 30 minutes discussed and encouraged, as a part of hypertension management. The importance of attaining a healthy weight is also discussed.  BP/Weight 01/15/2018 09/10/2017 07/12/2017 07/11/2017 05/08/2017 71/11/9410 9/0/4753  Systolic BP 391 792 178 375 423 702 301  Diastolic BP 84 84 91 98 82 94 82  Wt. (Lbs) 198 195 180 - 188 192 175  BMI 32.95 32.45 29.95 - 31.28 34.01 31

## 2018-01-20 NOTE — Assessment & Plan Note (Signed)
Unable to collect specimen, treat empirically with cipro, pt to return urine for c/s only in am

## 2018-01-20 NOTE — Assessment & Plan Note (Signed)
Deteriorated. Patient re-educated about  the importance of commitment to a  minimum of 150 minutes of exercise per week.  The importance of healthy food choices with portion control discussed. Encouraged to start a food diary, count calories and to consider  joining a support group. Sample diet sheets offered. Goals set by the patient for the next several months.   Weight /BMI 01/15/2018 09/10/2017 07/12/2017  WEIGHT 198 lb 195 lb 180 lb  HEIGHT 5\' 5"  5\' 5"  -  BMI 32.95 kg/m2 32.45 kg/m2 29.95 kg/m2

## 2018-01-20 NOTE — Progress Notes (Signed)
Caitlin Thompson     MRN: [id number]      DOB: 03/01/1961   HPI Ms. Giraldo is here for follow up and re-evaluation of chronic medical conditions, medication management and review of any available recent lab and radiology data.  Preventive health is updated, specifically  Cancer screening and Immunization.   Questions or concerns regarding consultations or procedures which the PT has had in the interim are  addressed. The PT denies any adverse reactions to current medications since the last visit.  C/o malodorous urine and frequency for approximately 1 month, has had pyelonephritis in the past. Denies fever, nausea or flank pain Denies polyuria, polydipsia, blurred vision , or hypoglycemic episodes.  ROS Denies recent fever or chills. Denies sinus pressure, nasal congestion, ear pain or sore throat. Denies chest congestion, productive cough or wheezing. Denies chest pains, palpitations and leg swelling Denies abdominal pain, nausea, vomiting,diarrhea or constipation.    Denies joint pain, swelling and limitation in mobility. Denies headaches, seizures, numbness, or tingling. Denies depression, anxiety or insomnia. Denies skin break down or rash.   PE  BP (!) 142/84   Pulse 97   Resp 16   Ht 5\' 5"  (1.651 m)   Wt 198 lb (89.8 kg)   SpO2 97%   BMI 32.95 kg/m   Patient alert and oriented and in no cardiopulmonary distress.  HEENT: No facial asymmetry, EOMI,   oropharynx pink and moist.  Neck supple no JVD, no mass.  Chest: Clear to auscultation bilaterally.  CVS: S1, S2 no murmurs, no S3.Regular rate.  ABD: Soft non tender.   Ext: No edema  MS: Adequate ROM spine, shoulders, hips and knees.  Skin: Intact, no ulcerations or rash noted.  Psych: Good eye contact, normal affect. Memory intact not anxious or depressed appearing.  CNS: CN 2-12 intact, power,  normal throughout.no focal deficits noted.   Assessment & Plan  Essential hypertension Nearly at goal ,  has severe drug allergy, unknown cause , no change in medication DASH diet and commitment to daily physical activity for a minimum of 30 minutes discussed and encouraged, as a part of hypertension management. The importance of attaining a healthy weight is also discussed.  BP/Weight 01/15/2018 09/10/2017 07/12/2017 07/11/2017 05/08/2017 63/10/4663 11/12/3568  Systolic BP 177 939 030 092 330 076 226  Diastolic BP 84 84 91 98 82 94 82  Wt. (Lbs) 198 195 180 - 188 192 175  BMI 32.95 32.45 29.95 - 31.28 34.01 31       Obesity (BMI 30.0-34.9) Deteriorated. Patient re-educated about  the importance of commitment to a  minimum of 150 minutes of exercise per week.  The importance of healthy food choices with portion control discussed. Encouraged to start a food diary, count calories and to consider  joining a support group. Sample diet sheets offered. Goals set by the patient for the next several months.   Weight /BMI 01/15/2018 09/10/2017 07/12/2017  WEIGHT 198 lb 195 lb 180 lb  HEIGHT 5\' 5"  5\' 5"  -  BMI 32.95 kg/m2 32.45 kg/m2 29.95 kg/m2      Hyperlipidemia LDL goal <100 Hyperlipidemia:Low fat diet discussed and encouraged.   Lipid Panel  Lab Results  Component Value Date   CHOL 208 (H) 01/11/2018   HDL 55 01/11/2018   LDLCALC 125 (H) 01/11/2018   TRIG 142 01/11/2018   CHOLHDL 3.8 01/11/2018   Not at goal a, may need to increase dose of crestor    Malodorous urine Unable to  collect specimen, treat empirically with cipro, pt to return urine for c/s only in am

## 2018-01-20 NOTE — Assessment & Plan Note (Addendum)
Hyperlipidemia:Low fat diet discussed and encouraged.   Lipid Panel  Lab Results  Component Value Date   CHOL 208 (H) 01/11/2018   HDL 55 01/11/2018   LDLCALC 125 (H) 01/11/2018   TRIG 142 01/11/2018   CHOLHDL 3.8 01/11/2018   Not at goal a, may need to increase dose of crestor

## 2018-01-28 ENCOUNTER — Ambulatory Visit (HOSPITAL_COMMUNITY)
Admission: RE | Admit: 2018-01-28 | Discharge: 2018-01-28 | Disposition: A | Discharge status: Home / Self Care | Payer: 59 | Source: Ambulatory Visit | Attending: Family Medicine | Admitting: Family Medicine

## 2018-01-28 ENCOUNTER — Encounter (HOSPITAL_COMMUNITY): Payer: Self-pay

## 2018-01-28 DIAGNOSIS — Z1231 Encounter for screening mammogram for malignant neoplasm of breast: Secondary | ICD-10-CM | POA: Diagnosis present

## 2018-03-05 ENCOUNTER — Encounter (HOSPITAL_COMMUNITY): Payer: Self-pay | Admitting: Emergency Medicine

## 2018-03-05 ENCOUNTER — Other Ambulatory Visit: Payer: Self-pay

## 2018-03-05 ENCOUNTER — Emergency Department (HOSPITAL_COMMUNITY): Payer: 59

## 2018-03-05 DIAGNOSIS — R9431 Abnormal electrocardiogram [ECG] [EKG]: Secondary | ICD-10-CM | POA: Diagnosis not present

## 2018-03-05 DIAGNOSIS — J209 Acute bronchitis, unspecified: Secondary | ICD-10-CM | POA: Insufficient documentation

## 2018-03-05 DIAGNOSIS — E119 Type 2 diabetes mellitus without complications: Secondary | ICD-10-CM | POA: Insufficient documentation

## 2018-03-05 DIAGNOSIS — Z7984 Long term (current) use of oral hypoglycemic drugs: Secondary | ICD-10-CM | POA: Insufficient documentation

## 2018-03-05 DIAGNOSIS — I10 Essential (primary) hypertension: Secondary | ICD-10-CM | POA: Insufficient documentation

## 2018-03-05 DIAGNOSIS — R0602 Shortness of breath: Secondary | ICD-10-CM | POA: Diagnosis not present

## 2018-03-05 DIAGNOSIS — R000 Tachycardia, unspecified: Secondary | ICD-10-CM | POA: Diagnosis not present

## 2018-03-05 DIAGNOSIS — R05 Cough: Secondary | ICD-10-CM | POA: Diagnosis not present

## 2018-03-05 MED ORDER — ALBUTEROL SULFATE (2.5 MG/3ML) 0.083% IN NEBU
5.0000 mg | INHALATION_SOLUTION | Freq: Once | RESPIRATORY_TRACT | Status: AC
  Administered 2018-03-05: 5 mg via RESPIRATORY_TRACT
  Filled 2018-03-05: qty 6

## 2018-03-05 NOTE — ED Triage Notes (Signed)
Pt reports cough since Friday with yellow sputum. Pt reported taking OTC cold medication without improvement.

## 2018-03-06 ENCOUNTER — Emergency Department (HOSPITAL_COMMUNITY)
Admission: EM | Admit: 2018-03-06 | Discharge: 2018-03-06 | Disposition: A | Discharge status: Home / Self Care | Payer: 59 | Attending: Emergency Medicine | Admitting: Emergency Medicine

## 2018-03-06 DIAGNOSIS — J209 Acute bronchitis, unspecified: Secondary | ICD-10-CM

## 2018-03-06 MED ORDER — PREDNISONE 10 MG PO TABS: 20.0000 mg | ORAL_TABLET | Freq: Two times a day (BID) | ORAL | 0 refills | Status: DC

## 2018-03-06 MED ORDER — PREDNISONE 20 MG PO TABS
20.0000 mg | ORAL_TABLET | Freq: Once | ORAL | Status: AC
  Administered 2018-03-06: 20 mg via ORAL
  Filled 2018-03-06: qty 1

## 2018-03-06 MED ORDER — AZITHROMYCIN 250 MG PO TABS: 250.0000 mg | ORAL_TABLET | Freq: Every day | ORAL | 0 refills | Status: DC

## 2018-03-06 MED ORDER — AZITHROMYCIN 250 MG PO TABS
500.0000 mg | ORAL_TABLET | Freq: Once | ORAL | Status: AC
  Administered 2018-03-06: 500 mg via ORAL
  Filled 2018-03-06: qty 2

## 2018-03-06 MED ORDER — ALBUTEROL SULFATE HFA 108 (90 BASE) MCG/ACT IN AERS
2.0000 | INHALATION_SPRAY | RESPIRATORY_TRACT | Status: DC | PRN
  Administered 2018-03-06: 2 via RESPIRATORY_TRACT
  Filled 2018-03-06: qty 6.7

## 2018-03-06 MED ORDER — IPRATROPIUM-ALBUTEROL 0.5-2.5 (3) MG/3ML IN SOLN
3.0000 mL | Freq: Once | RESPIRATORY_TRACT | Status: AC
  Administered 2018-03-06: 3 mL via RESPIRATORY_TRACT
  Filled 2018-03-06: qty 3

## 2018-03-06 NOTE — ED Provider Notes (Signed)
[facility] DEPT Provider Note   CSN: [id number] Arrival date & time: 03/05/18  2037     History   Chief Complaint Chief Complaint  Patient presents with  . Cough    HPI Caitlin Thompson is a 58 y.o. female.  Patient is a 58 year old female with past medical history of hypertension, hyperlipidemia, and diabetes.  She presents today for evaluation of chest congestion and cough.  This has been worsening over the past week.  She describes cough productive of yellow sputum.  She denies any fevers, but has felt chilled.  She has been taking over-the-counter medications with little relief.  The history is provided by the patient.  Cough  This is a new problem. Episode onset: 1 week ago. The problem occurs constantly. The problem has been gradually worsening. The cough is productive of sputum. There has been no fever. Associated symptoms include chills. Pertinent negatives include no chest pain, no shortness of breath and no wheezing. She has tried decongestants for the symptoms. The treatment provided no relief. She is not a smoker.    Past Medical History:  Diagnosis Date  . Adenomatous colon polyp 06/26/2011   05/17/11 Colonoscopy Dr Chauncy Lean adenoma Next colonoscopy 05/2016   . DERMATOMYCOSIS 05/23/2010   Qualifier: Diagnosis of  By: Moshe Cipro MD, Joycelyn Schmid    . Diabetes mellitus type II 2007  . GERD (gastroesophageal reflux disease) 04/03/2011   Non-critical Schatzki's ring, HH on EGD 05/17/11 Dr Gala Romney   . Hematuria   . HYDRONEPHROSIS, LEFT 08/30/2009   Qualifier: Diagnosis of  By: Claybon Jabs PA, Dawn    . HYPERLIPIDEMIA 12/01/2009   Qualifier: Diagnosis of  By: Claybon Jabs PA, Robinette    . Hypertension   . Obesity (BMI 30.0-34.9) 07/15/2011  . Renal lithiasis 6/11   Hospitalized a [employer] fr 3 days in June   . Schatzki's ring   . THYROID STIMULATING HORMONE, ABNORMAL 08/30/2009   Qualifier: Diagnosis of  By: Claybon Jabs PA, Dawn    . Tubular adenoma of  colon 3/13    Patient Active Problem List   Diagnosis Date Noted  . Statin declined 01/20/2018  . Spondylosis, cervical 05/27/2016  . Malodorous urine 10/31/2015  . Diabetes mellitus, insulin dependent (IDDM), controlled ([place]) 03/23/2014  . Obesity (BMI 30.0-34.9) 07/15/2011  . Adenomatous colon polyp 06/26/2011  . Tubular adenoma of colon 05/09/2011  . GERD (gastroesophageal reflux disease) 04/03/2011  . Essential hypertension 03/03/2010  . Hyperlipidemia LDL goal <100 12/01/2009  . RENAL CALCULUS 08/30/2009    Past Surgical History:  Procedure Laterality Date  . COLONOSCOPY W/ POLYPECTOMY  05/17/11   Rourk-Tubular adenoma colon, benign SB bx  . ESOPHAGEAL DILATION  05/17/2011   Rourk-Hiatal hernia/Incomplete noncritical Schatzki's ring/HH  . WISDOM TOOTH EXTRACTION       OB History   No obstetric history on file.      Home Medications    Prior to Admission medications   Medication Sig Start Date End Date Taking? Authorizing Provider  amLODipine (NORVASC) 5 MG tablet Take 1 tablet (5 mg total) by mouth daily. 10/05/16   Fayrene Helper, MD  ciprofloxacin (CIPRO) 500 MG tablet Take 1 tablet (500 mg total) by mouth 2 (two) times daily. 01/15/18   Fayrene Helper, MD  ergocalciferol (VITAMIN D2) 1.25 MG (50000 UT) capsule Take 1 capsule (50,000 Units total) by mouth once a week. One capsule once weekly 01/15/18   Fayrene Helper, MD  fluconazole (DIFLUCAN) 150 MG tablet One tablet once  daily as needed, for vaginal itch 01/15/18   Fayrene Helper, MD  furosemide (LASIX) 20 MG tablet Take 1 tablet (20 mg total) by mouth daily for 4 days. 07/12/17 07/16/17  Wieters, Hallie C, PA-C  Insulin Glargine (BASAGLAR KWIKPEN) 100 UNIT/ML SOPN INJECT 70 UNITS SUBCUTANEOUSLY ONCE DAILY 11/09/17   Fayrene Helper, MD  metFORMIN (GLUCOPHAGE) 500 MG tablet Take 1 tablet (500 mg total) by mouth 2 (two) times daily with a meal. 09/15/17   Fayrene Helper, MD  [facility] VERIO test  strip USE ONE STRIP TO CHECK GLUCOSE THREE TIMES DAILY 02/15/17   Fayrene Helper, MD  [facility] VERIO test strip USE ONE STRIP TO CHECK GLUCOSE THREE TIMES DAILY 05/14/17   Fayrene Helper, MD  rosuvastatin (CRESTOR) 5 MG tablet Take 1 tablet (5 mg total) by mouth daily. 09/15/17   Fayrene Helper, MD    Family History Family History  Problem Relation Age of Onset  . Diabetes Mother   . Hypertension Mother   . Kidney failure Mother   . Diabetes Father   . Hypertension Father   . Kidney failure Father   . Diabetes Brother        x2  . Crohn's disease Sister   . Aneurysm Sister     Social History Social History   Tobacco Use  . Smoking status: Never Smoker  . Smokeless tobacco: Never Used  Substance Use Topics  . Alcohol use: No  . Drug use: No     Allergies   Codeine; Fish allergy; Parlodel [bromocriptine mesylate]; Penicillins; and Sulfonamide derivatives   Review of Systems Review of Systems  Constitutional: Positive for chills.  Respiratory: Positive for cough. Negative for shortness of breath and wheezing.   Cardiovascular: Negative for chest pain.  All other systems reviewed and are negative.    Physical Exam Updated Vital Signs BP (!) 189/95 (BP Location: Left Arm)   Pulse (!) 120   Temp 99.2 F (37.3 C) (Oral)   Resp 18   Ht 5\' 3"  (1.6 m)   Wt 83.9 kg   SpO2 97%   BMI 32.77 kg/m   Physical Exam Vitals signs and nursing note reviewed.  Constitutional:      General: She is not in acute distress.    Appearance: She is well-developed. She is not diaphoretic.  HENT:     Head: Normocephalic and atraumatic.  Neck:     Musculoskeletal: Normal range of motion and neck supple.  Cardiovascular:     Rate and Rhythm: Normal rate and regular rhythm.     Heart sounds: No murmur. No friction rub. No gallop.   Pulmonary:     Effort: Pulmonary effort is normal. No respiratory distress.     Breath sounds: Normal breath sounds. No wheezing.    Abdominal:     General: Bowel sounds are normal. There is no distension.     Palpations: Abdomen is soft.     Tenderness: There is no abdominal tenderness.  Musculoskeletal: Normal range of motion.  Skin:    General: Skin is warm and dry.  Neurological:     Mental Status: She is alert and oriented to person, place, and time.      ED Treatments / Results  Labs (all labs ordered are listed, but only abnormal results are displayed) Labs Reviewed - No data to display  EKG None  Radiology Dg Chest 2 View  Result Date: 03/05/2018 CLINICAL DATA:  Acute onset of cough and shortness  of breath. EXAM: CHEST - 2 VIEW COMPARISON:  Chest radiograph performed 04/01/2015 FINDINGS: The lungs are well-aerated. Mild peribronchial thickening is noted. There is no evidence of focal opacification, pleural effusion or pneumothorax. The heart is normal in size; the mediastinal contour is within normal limits. No acute osseous abnormalities are seen. IMPRESSION: Mild peribronchial thickening noted. Lungs otherwise clear. Electronically Signed   By: Garald Balding M.D.   On: 03/05/2018 22:00    Procedures Procedures (including critical care time)  Medications Ordered in ED Medications  ipratropium-albuterol (DUONEB) 0.5-2.5 (3) MG/3ML nebulizer solution 3 mL (has no administration in time range)  albuterol (PROVENTIL HFA;VENTOLIN HFA) 108 (90 Base) MCG/ACT inhaler 2 puff (has no administration in time range)  azithromycin (ZITHROMAX) tablet 500 mg (has no administration in time range)  predniSONE (DELTASONE) tablet 20 mg (has no administration in time range)  albuterol (PROVENTIL) (2.5 MG/3ML) 0.083% nebulizer solution 5 mg (5 mg Nebulization Given 03/05/18 2128)     Initial Impression / Assessment and Plan / ED Course  I have reviewed the triage vital signs and the nursing notes.  Pertinent labs & imaging results that were available during my care of the patient were reviewed by me and  considered in my medical decision making (see chart for details).  Patient presents with worsening URI symptoms for 1 week.  Her chest x-ray is negative for pneumonia, however does show peribronchial thickening.  She was given an albuterol treatment with some relief.  She will be given a second albuterol treatment and discharged with prednisone, and albuterol MDI, and Zithromax for presumed bronchitis.  Final Clinical Impressions(s) / ED Diagnoses   Final diagnoses:  None    ED Discharge Orders    None       Veryl Speak, MD 03/06/18 [phone]

## 2018-03-06 NOTE — Discharge Instructions (Signed)
Zithromax and prednisone as prescribed.  Albuterol inhaler: 2 puffs every 4 hours as needed for wheezing.  Return to the emergency department if you develop severe chest pain, worsening breathing, or other new and concerning symptoms.

## 2018-05-06 ENCOUNTER — Other Ambulatory Visit: Payer: Self-pay | Admitting: Family Medicine

## 2018-05-18 LAB — COMPLETE METABOLIC PANEL WITH GFR
AG RATIO: 1.1 (calc) (ref 1.0–2.5)
ALBUMIN MSPROF: 4 g/dL (ref 3.6–5.1)
ALT: 12 U/L (ref 6–29)
AST: 13 U/L (ref 10–35)
Alkaline phosphatase (APISO): 74 U/L (ref 37–153)
BILIRUBIN TOTAL: 0.4 mg/dL (ref 0.2–1.2)
BUN: 13 mg/dL (ref 7–25)
CHLORIDE: 104 mmol/L (ref 98–110)
CO2: 26 mmol/L (ref 20–32)
Calcium: 9.6 mg/dL (ref 8.6–10.4)
Creat: 0.75 mg/dL (ref 0.50–1.05)
GFR, Est African American: 104 mL/min/{1.73_m2} (ref >=60)
GFR, Est Non African American: 90 mL/min/{1.73_m2} (ref >=60)
GLOBULIN: 3.8 g/dL — AB (ref 1.9–3.7)
Glucose, Bld: 96 mg/dL (ref 65–99)
POTASSIUM: 4.3 mmol/L (ref 3.5–5.3)
Sodium: 141 mmol/L (ref 135–146)
Total Protein: 7.8 g/dL (ref 6.1–8.1)

## 2018-05-18 LAB — LIPID PANEL
CHOL/HDL RATIO: 3.9 (calc) (ref <5.0)
CHOLESTEROL: 236 mg/dL — AB (ref <200)
HDL: 60 mg/dL (ref >=50)
LDL CHOLESTEROL (CALC): 151 mg/dL — AB
Non-HDL Cholesterol (Calc): 176 mg/dL (calc) — ABNORMAL HIGH (ref <130)
TRIGLYCERIDES: 129 mg/dL (ref <150)

## 2018-05-18 LAB — HEMOGLOBIN A1C
HEMOGLOBIN A1C: 8 %{Hb} — AB (ref <5.7)
MEAN PLASMA GLUCOSE: 183 (calc)
eAG (mmol/L): 10.1 (calc)

## 2018-05-21 ENCOUNTER — Ambulatory Visit (INDEPENDENT_AMBULATORY_CARE_PROVIDER_SITE_OTHER): Payer: 59 | Admitting: Family Medicine

## 2018-05-21 ENCOUNTER — Other Ambulatory Visit: Payer: Self-pay | Admitting: Family Medicine

## 2018-05-21 ENCOUNTER — Encounter: Payer: Self-pay | Admitting: Family Medicine

## 2018-05-21 ENCOUNTER — Other Ambulatory Visit: Payer: Self-pay

## 2018-05-21 VITALS — BP 142/76 | HR 100 | Resp 14 | Ht 63.5 in | Wt 199.0 lb

## 2018-05-21 DIAGNOSIS — Z794 Long term (current) use of insulin: Secondary | ICD-10-CM

## 2018-05-21 DIAGNOSIS — E669 Obesity, unspecified: Secondary | ICD-10-CM

## 2018-05-21 DIAGNOSIS — E785 Hyperlipidemia, unspecified: Secondary | ICD-10-CM

## 2018-05-21 DIAGNOSIS — I10 Essential (primary) hypertension: Secondary | ICD-10-CM | POA: Diagnosis not present

## 2018-05-21 DIAGNOSIS — IMO0001 Reserved for inherently not codable concepts without codable children: Secondary | ICD-10-CM

## 2018-05-21 DIAGNOSIS — D126 Benign neoplasm of colon, unspecified: Secondary | ICD-10-CM

## 2018-05-21 DIAGNOSIS — Z23 Encounter for immunization: Secondary | ICD-10-CM

## 2018-05-21 DIAGNOSIS — E119 Type 2 diabetes mellitus without complications: Secondary | ICD-10-CM

## 2018-05-21 MED ORDER — DOXYCYCLINE HYCLATE 100 MG PO TABS: 100.0000 mg | ORAL_TABLET | Freq: Two times a day (BID) | ORAL | 0 refills | Status: DC

## 2018-05-21 MED ORDER — FLUCONAZOLE 150 MG PO TABS: ORAL_TABLET | ORAL | 0 refills | Status: DC

## 2018-05-21 MED ORDER — TERBINAFINE HCL 250 MG PO TABS: 250.0000 mg | ORAL_TABLET | Freq: Every day | ORAL | 0 refills | Status: DC

## 2018-05-21 NOTE — Patient Instructions (Addendum)
F/U in 3 months and 1 weeks. shingrix #2 at next visit  Please change food and drink choice to improve your blood sugar and resume regular exercise , your blood  Sugar is too high  Shingrix #1 today Microalb from office today  Fasting lipid, cmp and EGFr and hBA1c and TSH in 3 months 5 days before visit  Please take crestor for cholesterol as prescribed  Medications are sent for fungal rash o left arm and also for infected skin on left leg. Take the doxycycline for leg before you take the tablet for fungus , terbinafine  Thank you  for choosing [facility]. We consider it a privelige to serve you.  Delivering excellent health care in a caring and  compassionate way is our goal.  Partnering with you,  so that together we can achieve this goal is our strategy.   Need eye exam and colonoscopy still please arrange let us know how we can help you!

## 2018-05-22 ENCOUNTER — Other Ambulatory Visit (HOSPITAL_COMMUNITY)
Admission: RE | Admit: 2018-05-22 | Discharge: 2018-05-22 | Disposition: A | Discharge status: Home / Self Care | Payer: 59 | Source: Other Acute Inpatient Hospital | Attending: Family Medicine | Admitting: Family Medicine

## 2018-05-22 DIAGNOSIS — Z794 Long term (current) use of insulin: Secondary | ICD-10-CM | POA: Insufficient documentation

## 2018-05-23 LAB — MICROALBUMIN / CREATININE URINE RATIO
CREATININE, UR: 113.1 mg/dL
MICROALB UR: 439.5 ug/mL — AB
MICROALB/CREAT RATIO: 389 mg/g{creat} — AB (ref 0–29)

## 2018-05-25 ENCOUNTER — Encounter: Payer: Self-pay | Admitting: Family Medicine

## 2018-05-25 DIAGNOSIS — Z23 Encounter for immunization: Secondary | ICD-10-CM | POA: Insufficient documentation

## 2018-05-25 NOTE — Assessment & Plan Note (Signed)
Pt to schedule colonoscopy which she is aware is overdue

## 2018-05-25 NOTE — Assessment & Plan Note (Signed)
Uncontrolled. Has severe allergy , unclear what is the inciting agent Weight loss and regular exercise will improve this DASH diet and commitment to daily physical activity for a minimum of 30 minutes discussed and encouraged, as a part of hypertension management. The importance of attaining a healthy weight is also discussed.  BP/Weight 05/21/2018 03/06/2018 03/05/2018 01/15/2018 09/10/2017 0/0/5259 1/0/2890  Systolic BP 228 406 - 986 148 307 354  Diastolic BP 76 97 - 84 84 91 98  Wt. (Lbs) 199 - 185 198 195 180 -  BMI 34.7 32.77 - 32.95 32.45 29.95 -

## 2018-05-25 NOTE — Assessment & Plan Note (Signed)
Deteriorated.  Patient re-educated about  the importance of commitment to a  minimum of 150 minutes of exercise per week as able.  The importance of healthy food choices with portion control discussed, as well as eating regularly and within a 12 hour window most days. The need to choose "clean , green" food 50 to 75% of the time is discussed, as well as to make water the primary drink and set a goal of 64 ounces water daily.  Encouraged to start a food diary,  and to consider  joining a support group. Sample diet sheets offered. Goals set by the patient for the next several months.   Weight /BMI 05/21/2018 03/06/2018 03/05/2018  WEIGHT 199 lb - 185 lb  HEIGHT 5' 3.5" - 5\' 3"   BMI 34.7 kg/m2 32.77 kg/m2 -

## 2018-05-25 NOTE — Assessment & Plan Note (Signed)
After obtaining informed consent, the vaccine is  administered , with no adverse effect noted at the time of administration.  

## 2018-05-25 NOTE — Assessment & Plan Note (Signed)
Caitlin Thompson is reminded of the importance of commitment to daily physical activity for 30 minutes or more, as able and the need to limit carbohydrate intake to 30 to 60 grams per meal to help with blood sugar control.   The need to take medication as prescribed, test blood sugar as directed, and to call between visits if there is a concern that blood sugar is uncontrolled is also discussed.   Caitlin Thompson is reminded of the importance of daily foot exam, annual eye examination, and good blood sugar, blood pressure and cholesterol control. Deteriorated , lifestyle modification needed  Diabetic Labs Latest Ref Rng & Units 05/22/2018 05/17/2018 01/11/2018 09/10/2017 05/04/2017  HbA1c <5.7 % of total Hgb - 8.0(H) 7.2(H) 7.3(H) 7.3(H)  Microalbumin Not Estab. ug/mL 439.5(H) - - - -  Micro/Creat Ratio 0 - 29 mg/g creat 389(H) - - - -  Chol <200 mg/dL - 236(H) 208(H) 225(H) -  HDL > OR = 50 mg/dL - 60 55 57 -  Calc LDL mg/dL (calc) - 151(H) 125(H) 145(H) -  Triglycerides <150 mg/dL - 129 142 117 -  Creatinine 0.50 - 1.05 mg/dL - 0.75 0.68 0.74 0.77   BP/Weight 05/21/2018 03/06/2018 03/05/2018 01/15/2018 09/10/2017 09/08/4358 08/10/7032  Systolic BP 035 248 - 185 909 311 216  Diastolic BP 76 97 - 84 84 91 98  Wt. (Lbs) 199 - 185 198 195 180 -  BMI 34.7 32.77 - 32.95 32.45 29.95 -   Foot/eye exam completion dates Latest Ref Rng & Units 01/15/2018 01/08/2017  Eye Exam No Retinopathy - -  Foot exam Order - - -  Foot Form Completion - Done Done

## 2018-05-25 NOTE — Assessment & Plan Note (Signed)
Hyperlipidemia:Low fat diet discussed and encouraged.   Lipid Panel  Lab Results  Component Value Date   CHOL 236 (H) 05/17/2018   HDL 60 05/17/2018   LDLCALC 151 (H) 05/17/2018   TRIG 129 05/17/2018   CHOLHDL 3.9 05/17/2018   Trial of low dose crestor , if tolerated, to educe CV risk

## 2018-05-25 NOTE — Progress Notes (Signed)
Caitlin Thompson     MRN: [id number]      DOB: 27-May-1960   HPI [name] is here for follow up and re-evaluation of chronic medical conditions, medication management and review of any available recent lab and radiology data.  Preventive health is updated, specifically  Cancer screening and Immunization.   Questions or concerns regarding consultations or procedures which the PT has had in the interim are  addressed. The PT denies any adverse reactions to current medications since the last visit.  There are no new concerns.  There are no specific complaints  Denies polyuria, polydipsia, blurred vision , or hypoglycemic episodes. Concerned about weight gain, and has been non compliant with food choice  ROS Denies recent fever or chills. Denies sinus pressure, nasal congestion, ear pain or sore throat. Denies chest congestion, productive cough or wheezing. Denies chest pains, palpitations and leg swelling Denies abdominal pain, nausea, vomiting,diarrhea or constipation.   Denies dysuria, frequency, hesitancy or incontinence. Denies joint pain, swelling and limitation in mobility. Denies headaches, seizures, numbness, or tingling. Denies depression, anxiety or insomnia. Denies skin break down or rash.   PE  BP (!) 142/76   Pulse 100   Resp 14   Ht 5' 3.5" (1.613 m)   Wt 199 lb (90.3 kg)   SpO2 97% Comment: room air  BMI 34.70 kg/m   Patient alert and oriented and in no cardiopulmonary distress.  HEENT: No facial asymmetry, EOMI,   oropharynx pink and moist.  Neck supple no JVD, no mass.  Chest: Clear to auscultation bilaterally.  CVS: S1, S2 no murmurs, no S3.Regular rate.  ABD: Soft non tender.   Ext: No edema  MS: Adequate ROM spine, shoulders, hips and knees.  Skin: Intact, no ulcerations or rash noted.  Psych: Good eye contact, normal affect. Memory intact not anxious or depressed appearing.  CNS: CN 2-12 intact, power,  normal throughout.no focal deficits  noted.   Assessment & Plan  Essential hypertension Uncontrolled. Has severe allergy , unclear what is the inciting agent Weight loss and regular exercise will improve this DASH diet and commitment to daily physical activity for a minimum of 30 minutes discussed and encouraged, as a part of hypertension management. The importance of attaining a healthy weight is also discussed.  BP/Weight 05/21/2018 03/06/2018 03/05/2018 01/15/2018 09/10/2017 10/05/4479 10/08/6312  Systolic BP 970 263 - 785 885 027 741  Diastolic BP 76 97 - 84 84 91 98  Wt. (Lbs) 199 - 185 198 195 180 -  BMI 34.7 32.77 - 32.95 32.45 29.95 -       Diabetes mellitus, insulin dependent (IDDM), controlled ([place]) [name] is reminded of the importance of commitment to daily physical activity for 30 minutes or more, as able and the need to limit carbohydrate intake to 30 to 60 grams per meal to help with blood sugar control.   The need to take medication as prescribed, test blood sugar as directed, and to call between visits if there is a concern that blood sugar is uncontrolled is also discussed.   [name] is reminded of the importance of daily foot exam, annual eye examination, and good blood sugar, blood pressure and cholesterol control. Deteriorated , lifestyle modification needed  Diabetic Labs Latest Ref Rng & Units 05/22/2018 05/17/2018 01/11/2018 09/10/2017 05/04/2017  HbA1c <5.7 % of total Hgb - 8.0(H) 7.2(H) 7.3(H) 7.3(H)  Microalbumin Not Estab. ug/mL 439.5(H) - - - -  Micro/Creat Ratio 0 - 29 mg/g creat 389(H) - - - -  Chol <200 mg/dL - 236(H) 208(H) 225(H) -  HDL > OR = 50 mg/dL - 60 55 57 -  Calc LDL mg/dL (calc) - 151(H) 125(H) 145(H) -  Triglycerides <150 mg/dL - 129 142 117 -  Creatinine 0.50 - 1.05 mg/dL - 0.75 0.68 0.74 0.77   BP/Weight 05/21/2018 03/06/2018 03/05/2018 01/15/2018 09/10/2017 09/08/4358 08/10/7032  Systolic BP 035 248 - 185 909 311 216  Diastolic BP 76 97 - 84 84 91 98  Wt. (Lbs) 199 - 185 198 195  180 -  BMI 34.7 32.77 - 32.95 32.45 29.95 -   Foot/eye exam completion dates Latest Ref Rng & Units 01/15/2018 01/08/2017  Eye Exam No Retinopathy - -  Foot exam Order - - -  Foot Form Completion - Done Done        Obesity (BMI 30.0-34.9) Deteriorated.  Patient re-educated about  the importance of commitment to a  minimum of 150 minutes of exercise per week as able.  The importance of healthy food choices with portion control discussed, as well as eating regularly and within a 12 hour window most days. The need to choose "clean , green" food 50 to 75% of the time is discussed, as well as to make water the primary drink and set a goal of 64 ounces water daily.  Encouraged to start a food diary,  and to consider  joining a support group. Sample diet sheets offered. Goals set by the patient for the next several months.   Weight /BMI 05/21/2018 03/06/2018 03/05/2018  WEIGHT 199 lb - 185 lb  HEIGHT 5' 3.5" - 5\' 3"   BMI 34.7 kg/m2 32.77 kg/m2 -      Hyperlipidemia LDL goal <100 Hyperlipidemia:Low fat diet discussed and encouraged.   Lipid Panel  Lab Results  Component Value Date   CHOL 236 (H) 05/17/2018   HDL 60 05/17/2018   LDLCALC 151 (H) 05/17/2018   TRIG 129 05/17/2018   CHOLHDL 3.9 05/17/2018   Trial of low dose crestor , if tolerated, to educe CV risk   Tubular adenoma of colon Pt to schedule colonoscopy which she is aware is overdue  Need for shingles vaccine After obtaining informed consent, the vaccine is  administered , with no adverse effect noted at the time of administration.

## 2018-06-02 ENCOUNTER — Other Ambulatory Visit: Payer: Self-pay | Admitting: Family Medicine

## 2018-06-03 ENCOUNTER — Other Ambulatory Visit: Payer: Self-pay

## 2018-06-03 MED ORDER — BASAGLAR KWIKPEN 100 UNIT/ML ~~LOC~~ SOPN: PEN_INJECTOR | SUBCUTANEOUS | 5 refills | Status: DC

## 2018-09-03 ENCOUNTER — Other Ambulatory Visit: Payer: Self-pay | Admitting: Family Medicine

## 2018-09-25 ENCOUNTER — Telehealth: Payer: Self-pay | Admitting: *Deleted

## 2018-09-25 NOTE — Telephone Encounter (Signed)
Pt called and had questions about her insulin. Said she went to pharmacy to pick it up and it had gone up from 20$ to 100$ and she is no longer able to afford this. Wanted to know if this could be changed to something different that she could afford because she needs her insulin.

## 2018-09-25 NOTE — Telephone Encounter (Signed)
pls send for preferred alternative , I will change let her know we are working on this

## 2018-09-26 ENCOUNTER — Other Ambulatory Visit: Payer: Self-pay | Admitting: Family Medicine

## 2018-09-26 MED ORDER — LANTUS SOLOSTAR 100 UNIT/ML ~~LOC~~ SOPN: 70.0000 [IU] | PEN_INJECTOR | Freq: Every day | SUBCUTANEOUS | 99 refills | Status: DC

## 2018-09-26 NOTE — Telephone Encounter (Signed)
Lantus prescription is sent in, I tried to call her no answer, please let her know, thanks

## 2018-09-26 NOTE — Telephone Encounter (Signed)
She is on basaglar. Is this the same dosing for lantus/levemir. Not very familiar with this insulin

## 2018-09-27 NOTE — Telephone Encounter (Signed)
Advised patient of new prescription with verbal understanding. 

## 2018-09-28 LAB — COMPLETE METABOLIC PANEL WITH GFR
AG Ratio: 1.1 (calc) (ref 1.0–2.5)
ALKALINE PHOSPHATASE (APISO): 67 U/L (ref 37–153)
ALT: 16 U/L (ref 6–29)
AST: 15 U/L (ref 10–35)
Albumin: 4.1 g/dL (ref 3.6–5.1)
BUN: 15 mg/dL (ref 7–25)
CALCIUM: 9.9 mg/dL (ref 8.6–10.4)
CO2: 23 mmol/L (ref 20–32)
Chloride: 101 mmol/L (ref 98–110)
Creat: 0.87 mg/dL (ref 0.50–1.05)
GFR, EST AFRICAN AMERICAN: 87 mL/min/{1.73_m2} (ref >=60)
GFR, Est Non African American: 75 mL/min/{1.73_m2} (ref >=60)
GLOBULIN: 3.8 g/dL — AB (ref 1.9–3.7)
Glucose, Bld: 195 mg/dL — ABNORMAL HIGH (ref 65–99)
Potassium: 4.7 mmol/L (ref 3.5–5.3)
Sodium: 137 mmol/L (ref 135–146)
TOTAL PROTEIN: 7.9 g/dL (ref 6.1–8.1)
Total Bilirubin: 0.5 mg/dL (ref 0.2–1.2)

## 2018-09-28 LAB — TSH: TSH: 0.61 m[IU]/L

## 2018-09-28 LAB — MICROALBUMIN / CREATININE URINE RATIO
Creatinine, Urine: 189 mg/dL (ref 20–275)
Microalb Creat Ratio: 310 mcg/mg creat — ABNORMAL HIGH (ref <30)
Microalb, Ur: 58.6 mg/dL

## 2018-09-28 LAB — LIPID PANEL
CHOL/HDL RATIO: 4.1 (calc) (ref <5.0)
Cholesterol: 243 mg/dL — ABNORMAL HIGH (ref <200)
HDL: 60 mg/dL (ref >=50)
LDL CHOLESTEROL (CALC): 161 mg/dL — AB
NON-HDL CHOLESTEROL (CALC): 183 mg/dL — AB (ref <130)
Triglycerides: 102 mg/dL (ref <150)

## 2018-09-28 LAB — HEMOGLOBIN A1C
Hgb A1c MFr Bld: 8.3 % of total Hgb — ABNORMAL HIGH (ref <5.7)
MEAN PLASMA GLUCOSE: 192 (calc)
eAG (mmol/L): 10.6 (calc)

## 2018-09-30 ENCOUNTER — Ambulatory Visit (INDEPENDENT_AMBULATORY_CARE_PROVIDER_SITE_OTHER): Payer: 59 | Admitting: Family Medicine

## 2018-09-30 ENCOUNTER — Other Ambulatory Visit: Payer: Self-pay

## 2018-09-30 ENCOUNTER — Encounter: Payer: Self-pay | Admitting: Family Medicine

## 2018-09-30 VITALS — BP 122/88 | HR 86 | Resp 12 | Ht 63.5 in | Wt 194.1 lb

## 2018-09-30 DIAGNOSIS — Z1231 Encounter for screening mammogram for malignant neoplasm of breast: Secondary | ICD-10-CM

## 2018-09-30 DIAGNOSIS — E119 Type 2 diabetes mellitus without complications: Secondary | ICD-10-CM | POA: Diagnosis not present

## 2018-09-30 DIAGNOSIS — Z23 Encounter for immunization: Secondary | ICD-10-CM

## 2018-09-30 DIAGNOSIS — D126 Benign neoplasm of colon, unspecified: Secondary | ICD-10-CM

## 2018-09-30 DIAGNOSIS — I10 Essential (primary) hypertension: Secondary | ICD-10-CM

## 2018-09-30 DIAGNOSIS — B369 Superficial mycosis, unspecified: Secondary | ICD-10-CM

## 2018-09-30 DIAGNOSIS — Z794 Long term (current) use of insulin: Secondary | ICD-10-CM

## 2018-09-30 DIAGNOSIS — IMO0001 Reserved for inherently not codable concepts without codable children: Secondary | ICD-10-CM

## 2018-09-30 DIAGNOSIS — E669 Obesity, unspecified: Secondary | ICD-10-CM

## 2018-09-30 DIAGNOSIS — E785 Hyperlipidemia, unspecified: Secondary | ICD-10-CM | POA: Diagnosis not present

## 2018-09-30 MED ORDER — BLOOD GLUCOSE METER KIT: PACK | 0 refills | Status: DC

## 2018-09-30 MED ORDER — METFORMIN HCL 1000 MG PO TABS: 1000.0000 mg | ORAL_TABLET | Freq: Two times a day (BID) | ORAL | 3 refills | Status: DC

## 2018-09-30 MED ORDER — ROSUVASTATIN CALCIUM 10 MG PO TABS: 10.0000 mg | ORAL_TABLET | Freq: Every day | ORAL | 3 refills | Status: DC

## 2018-09-30 MED ORDER — CLOTRIMAZOLE-BETAMETHASONE 1-0.05 % EX CREA: 1.0000 "application " | TOPICAL_CREAM | Freq: Two times a day (BID) | CUTANEOUS | 1 refills | Status: DC

## 2018-09-30 NOTE — Assessment & Plan Note (Signed)
  Patient re-educated about  the importance of commitment to a  minimum of 150 minutes of exercise per week as able.  The importance of healthy food choices with portion control discussed, as well as eating regularly and within a 12 hour window most days. The need to choose "clean , green" food 50 to 75% of the time is discussed, as well as to make water the primary drink and set a goal of 64 ounces water daily.    Weight /BMI 09/30/2018 05/21/2018 03/06/2018  WEIGHT 194 lb 1.9 oz 199 lb -  HEIGHT 5' 3.5" 5' 3.5" -  BMI 33.85 kg/m2 34.7 kg/m2 32.77 kg/m2

## 2018-09-30 NOTE — Patient Instructions (Addendum)
Annual exam with MD in mid November call if you need me sooner.  Follow-up uncontrolled diabetes and hyperlipidemia at visit also.  Fasting ;lipid, cmp and EGFR and hBA1C 5 days before next visit  You are being referred to ophthalmology for diabetic eye exam.  You being referred for repeat colonoscopy with Dr. Gala Romney which is overdue because of a tubular adenoma found 5 years ago.  Dose increase in metformin to 1000 mg twice daily, start Lantus at 45 units daily and titrate up as every 3 days if fasting blood sugar remains above 130 the prescription is sent in for 70 units daily but she will start at 45 units daily.  Take crestor 10 mg daily for cholesterol and reduce fried and fatty foods please  Antifungal and steroid cream is prescribed for the rash which has been occurring intermittently on the upper legs and thighs.   testing supplies for 3 times daily testing with a new meter will be prescribed once we determine what is covered by her insurance.

## 2018-09-30 NOTE — Assessment & Plan Note (Signed)
Hyperlipidemia:Low fat diet discussed and encouraged.   Lipid Panel  Lab Results  Component Value Date   CHOL 243 (H) 09/27/2018   HDL 60 09/27/2018   LDLCALC 161 (H) 09/27/2018   TRIG 102 09/27/2018   CHOLHDL 4.1 09/27/2018  ucontrolled , needs statin , crestor increased to 10 mg Hyperlipidemia:Low fat diet discussed and encouraged.   Updated lab needed at/ before next visit.

## 2018-09-30 NOTE — Progress Notes (Signed)
Caitlin Thompson     MRN: [id number]      DOB: 03-Nov-1960   HPI Caitlin Thompson is here for follow up and re-evaluation of chronic medical conditions, medication management and review of any available recent lab and radiology data.  Preventive health is updated, specifically  Cancer screening and Immunization.   Questions or concerns regarding consultations or procedures which the PT has had in the interim are  addressed. The PT denies any adverse reactions to current medications since the last visit.  There are no new concerns.  There are no specific complaints   ROS Denies recent fever or chills. Denies sinus pressure, nasal congestion, ear pain or sore throat. Denies chest congestion, productive cough or wheezing. Denies chest pains, palpitations and leg swelling Denies abdominal pain, nausea, vomiting,diarrhea or constipation.   Denies dysuria, frequency, hesitancy or incontinence. Denies joint pain, swelling and limitation in mobility. Denies headaches, seizures, numbness, or tingling. Denies depression, anxiety or insomnia. Denies polyuria, polydipsia, blurred vision , or hypoglycemic episodes. Not exercising as  Much as in the past   PE  BP 122/88   Pulse 86   Resp 12   Ht 5' 3.5" (1.613 m)   Wt 194 lb 1.9 oz (88.1 kg)   SpO2 97%   BMI 33.85 kg/m   Patient alert and oriented and in no cardiopulmonary distress.  HEENT: No facial asymmetry, EOMI,   oropharynx pink and moist.  Neck supple no JVD, no mass.  Chest: Clear to auscultation bilaterally.  CVS: S1, S2 no murmurs, no S3.Regular rate.  ABD: Soft non tender.   Ext: No edema  MS: Adequate ROM spine, shoulders, hips and knees.  Skin: Intact, no ulcerations or rash noted.Hyperpigmentation noted in macular patches  Psych: Good eye contact, normal affect. Memory intact not anxious or depressed appearing.  CNS: CN 2-12 intact, power,  normal throughout.no focal deficits noted.   Assessment & Plan   Hyperlipidemia LDL goal <100 Hyperlipidemia:Low fat diet discussed and encouraged.   Lipid Panel  Lab Results  Component Value Date   CHOL 243 (H) 09/27/2018   HDL 60 09/27/2018   LDLCALC 161 (H) 09/27/2018   TRIG 102 09/27/2018   CHOLHDL 4.1 09/27/2018  ucontrolled , needs statin , crestor increased to 10 mg Hyperlipidemia:Low fat diet discussed and encouraged.   Updated lab needed at/ before next visit.          Need for shingles vaccine After obtaining informed consent, the vaccine is  administered , with no adverse effect noted at the time of administration.   Diabetes mellitus, insulin dependent (IDDM), controlled (HCC) Deteriorated , new chANGE TO LANTUS, PT TAKES 45 UNITS, METFORMIN DOSE INCREASED  Caitlin Thompson is reminded of the importance of commitment to daily physical activity for 30 minutes or more, as able and the need to limit carbohydrate intake to 30 to 60 grams per meal to help with blood sugar control.   The need to take medication as prescribed, test blood sugar as directed, and to call between visits if there is a concern that blood sugar is uncontrolled is also discussed.   Caitlin Thompson is reminded of the importance of daily foot exam, annual eye examination, and good blood sugar, blood pressure and cholesterol control. INCREASE METFORMIN TO 1000 MG TWICE DAILY, AND RFER FOR EYE EXAM  Diabetic Labs Latest Ref Rng & Units 09/27/2018 05/22/2018 05/17/2018 01/11/2018 09/10/2017  HbA1c <5.7 % of total Hgb 8.3(H) - 8.0(H) 7.2(H) 7.3(H)  Microalbumin mg/dL  58.6 439.5(H) - - -  Micro/Creat Ratio <30 mcg/mg creat 310(H) 389(H) - - -  Chol <200 mg/dL 243(H) - 236(H) 208(H) 225(H)  HDL > OR = 50 mg/dL 60 - 60 55 57  Calc LDL mg/dL (calc) 161(H) - 151(H) 125(H) 145(H)  Triglycerides <150 mg/dL 102 - 129 142 117  Creatinine 0.50 - 1.05 mg/dL 0.87 - 0.75 0.68 0.74   BP/Weight 09/30/2018 05/21/2018 03/06/2018 03/05/2018 01/15/2018 09/11/9782 09/11/4126  Systolic BP 208 138  871 - 959 747 185  Diastolic BP 88 76 97 - 84 84 91  Wt. (Lbs) 194.12 199 - 185 198 195 180  BMI 33.85 34.7 32.77 - 32.95 32.45 29.95   Foot/eye exam completion dates Latest Ref Rng & Units 01/15/2018 01/08/2017  Eye Exam No Retinopathy - -  Foot exam Order - - -  Foot Form Completion - Done Done   Updated lab needed at/ before next visit.      Adenomatous colon polyp Tubular adenoma in 2013,  rept colonoscopy due  Obesity (BMI 30.0-34.9)  Patient re-educated about  the importance of commitment to a  minimum of 150 minutes of exercise per week as able.  The importance of healthy food choices with portion control discussed, as well as eating regularly and within a 12 hour window most days. The need to choose "clean , green" food 50 to 75% of the time is discussed, as well as to make water the primary drink and set a goal of 64 ounces water daily.    Weight /BMI 09/30/2018 05/21/2018 03/06/2018  WEIGHT 194 lb 1.9 oz 199 lb -  HEIGHT 5' 3.5" 5' 3.5" -  BMI 33.85 kg/m2 34.7 kg/m2 32.77 kg/m2      Dermatomycosis intermittent PRURITIC RASH IN GROIN AND UPPER THIGHS AND SCALP X 1 MONTH, NO ACTIVE LESION SEEN AT VISIT, CLOTRIMAZOLE/BETAMETH PRSCRIBED

## 2018-09-30 NOTE — Assessment & Plan Note (Signed)
intermittent PRURITIC RASH IN GROIN AND UPPER THIGHS AND SCALP X 1 MONTH, NO ACTIVE LESION SEEN AT VISIT, CLOTRIMAZOLE/BETAMETH PRSCRIBED

## 2018-09-30 NOTE — Assessment & Plan Note (Addendum)
Deteriorated , new chANGE TO LANTUS, PT TAKES 45 UNITS, METFORMIN DOSE INCREASED  Caitlin Thompson is reminded of the importance of commitment to daily physical activity for 30 minutes or more, as able and the need to limit carbohydrate intake to 30 to 60 grams per meal to help with blood sugar control.   The need to take medication as prescribed, test blood sugar as directed, and to call between visits if there is a concern that blood sugar is uncontrolled is also discussed.   Caitlin Thompson is reminded of the importance of daily foot exam, annual eye examination, and good blood sugar, blood pressure and cholesterol control. INCREASE METFORMIN TO 1000 MG TWICE DAILY, AND RFER FOR EYE EXAM  Diabetic Labs Latest Ref Rng & Units 09/27/2018 05/22/2018 05/17/2018 01/11/2018 09/10/2017  HbA1c <5.7 % of total Hgb 8.3(H) - 8.0(H) 7.2(H) 7.3(H)  Microalbumin mg/dL 58.6 439.5(H) - - -  Micro/Creat Ratio <30 mcg/mg creat 310(H) 389(H) - - -  Chol <200 mg/dL 243(H) - 236(H) 208(H) 225(H)  HDL > OR = 50 mg/dL 60 - 60 55 57  Calc LDL mg/dL (calc) 161(H) - 151(H) 125(H) 145(H)  Triglycerides <150 mg/dL 102 - 129 142 117  Creatinine 0.50 - 1.05 mg/dL 0.87 - 0.75 0.68 0.74   BP/Weight 09/30/2018 05/21/2018 03/06/2018 03/05/2018 01/15/2018 4/0/1027 04/10/3662  Systolic BP 403 474 259 - 563 875 643  Diastolic BP 88 76 97 - 84 84 91  Wt. (Lbs) 194.12 199 - 185 198 195 180  BMI 33.85 34.7 32.77 - 32.95 32.45 29.95   Foot/eye exam completion dates Latest Ref Rng & Units 01/15/2018 01/08/2017  Eye Exam No Retinopathy - -  Foot exam Order - - -  Foot Form Completion - Done Done   Updated lab needed at/ before next visit.

## 2018-09-30 NOTE — Assessment & Plan Note (Signed)
Tubular adenoma in 2013,  rept colonoscopy due

## 2018-09-30 NOTE — Assessment & Plan Note (Signed)
After obtaining informed consent, the vaccine is  administered , with no adverse effect noted at the time of administration.  

## 2018-10-14 ENCOUNTER — Encounter: Payer: Self-pay | Admitting: Internal Medicine

## 2018-11-14 ENCOUNTER — Other Ambulatory Visit: Payer: Self-pay

## 2018-11-14 MED ORDER — ONETOUCH VERIO VI STRP: ORAL_STRIP | 2 refills | Status: DC

## 2018-11-20 ENCOUNTER — Ambulatory Visit (INDEPENDENT_AMBULATORY_CARE_PROVIDER_SITE_OTHER): Payer: Self-pay | Admitting: *Deleted

## 2018-11-20 ENCOUNTER — Other Ambulatory Visit: Payer: Self-pay

## 2018-11-20 DIAGNOSIS — Z86010 Personal history of colonic polyps: Secondary | ICD-10-CM

## 2018-11-20 MED ORDER — NA SULFATE-K SULFATE-MG SULF 17.5-3.13-1.6 GM/177ML PO SOLN: 1.0000 | Freq: Once | ORAL | 0 refills | Status: AC

## 2018-11-20 NOTE — Progress Notes (Addendum)
Gastroenterology Pre-Procedure Review  Request Date: 11/20/2018 Requesting Physician: Dr. Moshe Cipro, Last TCS 05/17/2011 done by Dr. Gala Romney, tubular adenoma  PATIENT REVIEW QUESTIONS: The patient responded to the following health history questions as indicated:    1. Diabetes Melitis: Yes 2. Joint replacements in the past 12 months: No 3. Major health problems in the past 3 months: No 4. Has an artificial valve or MVP: No 5. Has a defibrillator: No 6. Has been advised in past to take antibiotics in advance of a procedure like teeth cleaning: No 7. Family history of colon cancer: No  8. Alcohol Use: No 9. History of sleep apnea: No  10. History of coronary artery or other vascular stents placed within the last 12 months: No 11. History of any prior anesthesia complications: Yes, 8 yrs ago when she had oral surgery, itching and rash    MEDICATIONS & ALLERGIES:    Patient reports the following regarding taking any blood thinners:   Plavix? No Aspirin? No Coumadin? No Brilinta? No Xarelto? No Eliquis? No Pradaxa? No Savaysa? No Effient? No  Patient confirms/reports the following medications:  Current Outpatient Medications  Medication Sig Dispense Refill  . blood glucose meter kit and supplies Dispense based on patient and insurance preference. Three times daily testing dx e11.65. 1 each 0  . glucose blood (ONETOUCH VERIO) test strip USE 1 STRIP TO CHECK GLUCOSE THREE TIMES DAILY 100 each 2  . Insulin Glargine (LANTUS SOLOSTAR) 100 UNIT/ML Solostar Pen Inject 70 Units into the skin daily. (Patient taking differently: Inject 40 Units into the skin daily. ) 5 pen PRN  . metFORMIN (GLUCOPHAGE) 1000 MG tablet Take 1 tablet (1,000 mg total) by mouth 2 (two) times daily with a meal. (Patient taking differently: Take 1,000 mg by mouth daily. ) 180 tablet 3  . ergocalciferol (VITAMIN D2) 1.25 MG (50000 UT) capsule Take 1 capsule (50,000 Units total) by mouth once a week. One capsule once weekly  (Patient not taking: Reported on 11/20/2018) 12 capsule 3  . furosemide (LASIX) 20 MG tablet Take 1 tablet (20 mg total) by mouth daily for 4 days. 5 tablet 0   No current facility-administered medications for this visit.     Patient confirms/reports the following allergies:  Allergies  Allergen Reactions  . Codeine     REACTION: nausea  . Parlodel [Bromocriptine Mesylate]     headache  . Penicillins Hives  . Sulfa Antibiotics Hives, Nausea And Vomiting and Other (See Comments)    hives   . Sulfonamide Derivatives Hives    No orders of the defined types were placed in this encounter.   [place] INFORMATION Primary Insurance: [facility] ,  [place] #: [id number],  Group #: [id number] Pre-Cert / Auth required: Yes, approved online 01/29/19- 9/67/2897 Pre-Cert / Josem Kaufmann #: [id number]  SCHEDULE INFORMATION: Procedure has been scheduled as follows:  Date: 01/29/2019, Time: 2:00 Location: [facility] with Dr. Gala Romney  This Gastroenterology Pre-Precedure Review Form is being routed to the following provider(s): Neil Crouch, PA-C

## 2018-11-20 NOTE — Progress Notes (Signed)
Ok to schedule.  Day before tCS: Lantus 1/2 dose, metformin 1/2 dose AM of TCS: hold lantus and metformin

## 2018-11-20 NOTE — Patient Instructions (Signed)
Caitlin Thompson  Dec 22, 1960 MRN: [id number]     Procedure Date: 01/29/2019 Time to register: 1:00 pm Place to register: [facility] Procedure Time: 2:00 pm Scheduled provider: Dr. Gala Romney    PREPARATION FOR COLONOSCOPY WITH SUPREP BOWEL PREP KIT  Note: Suprep Bowel Prep Kit is a split-dose (2day) regimen. Consumption of BOTH 6-ounce bottles is required for a complete prep.  Please notify us immediately if you are diabetic, take iron supplements, or if you are on Coumadin or any other blood thinners.  Please hold the following medications: See letter                                                                                                                                                  2 DAYS BEFORE PROCEDURE:  DATE: 01/27/2019   DAY: Monday Begin clear liquid diet AFTER your lunch meal. NO SOLID FOODS after this point.  1 DAY BEFORE PROCEDURE:  DATE: 01/28/2019  DAY: Tuesday Continue clear liquids the entire day - NO SOLID FOOD.   Diabetic medications adjustments for today: See letter  At 6:00pm: Complete steps 1 through 4 below, using ONE (1) 6-ounce bottle, before going to bed. Step 1:  Pour ONE (1) 6-ounce bottle of SUPREP liquid into the mixing container.  Step 2:  Add cool drinking water to the 16 ounce line on the container and mix.  Note: Dilute the solution concentrate as directed prior to use. Step 3:  DRINK ALL the liquid in the container. Step 4:  You MUST drink an additional two (2) or more 16 ounce containers of water over the next one (1) hour.   Continue clear liquids.  DAY OF PROCEDURE:   DATE: 01/29/2019  DAY: Wednesday If you take medications for your heart, blood pressure, or breathing, you may take these medications.  Diabetic medications adjustments for today: See letter  5 hours before your procedure at :  9:00 am Step 1:  Pour ONE (1) 6-ounce bottle of SUPREP liquid into the mixing container.  Step 2:  Add cool drinking water to the  16 ounce line on the container and mix.  Note: Dilute the solution concentrate as directed prior to use. Step 3:  DRINK ALL the liquid in the container. Step 4:  You MUST drink an additional two (2) or more 16 ounce containers of water over the next one (1) hour. You MUST complete the final glass of water at least 3 hours before your colonoscopy. Nothing by mouth past 11:00 am  You may take your morning medications with sip of water unless we have instructed otherwise.    Please see below for Dietary Information.  CLEAR LIQUIDS INCLUDE:  Water Jello (NOT red in color)   Ice Popsicles (NOT red in color)   Tea (sugar ok, no milk/cream) Powdered fruit flavored drinks  Coffee (  sugar ok, no milk/cream) Gatorade/ Lemonade/ Kool-Aid  (NOT red in color)   Juice: apple, white grape, white cranberry Soft drinks  Clear bullion, consomme, broth (fat free beef/chicken/vegetable)  Carbonated beverages (any kind)  Strained chicken noodle soup Hard Candy   Remember: Clear liquids are liquids that will allow you to see your fingers on the other side of a clear glass. Be sure liquids are NOT red in color, and not cloudy, but CLEAR.  DO NOT EAT OR DRINK ANY OF THE FOLLOWING:  Dairy products of any kind   Cranberry juice Tomato juice / V8 juice   Grapefruit juice Orange juice     Red grape juice  Do not eat any solid foods, including such foods as: cereal, oatmeal, yogurt, fruits, vegetables, creamed soups, eggs, bread, crackers, pureed foods in a blender, etc.   HELPFUL HINTS FOR DRINKING PREP SOLUTION:   Make sure prep is extremely cold. Mix and refrigerate the the morning of the prep. You may also put in the freezer.   You may try mixing some Crystal Light or Country Time Lemonade if you prefer. Mix in small amounts; add more if necessary.  Try drinking through a straw  Rinse mouth with water or a mouthwash between glasses, to remove after-taste.  Try sipping on a cold beverage /ice/ popsicles  between glasses of prep.  Place a piece of sugar-free hard candy in mouth between glasses.  If you become nauseated, try consuming smaller amounts, or stretch out the time between glasses. Stop for 30-60 minutes, then slowly start back drinking.     OTHER INSTRUCTIONS  You will need a responsible adult at least 56 years of age to accompany you and drive you home. This person must remain in the waiting room during your procedure. The hospital will cancel your procedure if you do not have a responsible adult with you.   1. Wear loose fitting clothing that is easily removed. 2. Leave jewelry and other valuables at home.  3. Remove all body piercing jewelry and leave at home. 4. Total time from sign-in until discharge is approximately 2-3 hours. 5. You should go home directly after your procedure and rest. You can resume normal activities the day after your procedure. 6. The day of your procedure you should not:  Drive  Make legal decisions  Operate machinery  Drink alcohol  Return to work   You may call the office (Dept: [phone]) before 5:00pm, or page the doctor on call [phone]) after 5:00pm, for further instructions, if necessary.   Insurance Information YOU WILL NEED TO CHECK WITH YOUR INSURANCE COMPANY FOR THE BENEFITS OF COVERAGE YOU HAVE FOR THIS PROCEDURE.  UNFORTUNATELY, NOT ALL INSURANCE COMPANIES HAVE BENEFITS TO COVER ALL OR PART OF THESE TYPES OF PROCEDURES.  IT IS YOUR RESPONSIBILITY TO CHECK YOUR BENEFITS, HOWEVER, WE WILL BE GLAD TO ASSIST YOU WITH ANY CODES YOUR INSURANCE COMPANY MAY NEED.    PLEASE NOTE THAT MOST INSURANCE COMPANIES WILL NOT COVER A SCREENING COLONOSCOPY FOR PEOPLE UNDER THE AGE OF 50  IF YOU HAVE BCBS INSURANCE, YOU MAY HAVE BENEFITS FOR A SCREENING COLONOSCOPY BUT IF POLYPS ARE FOUND THE DIAGNOSIS WILL CHANGE AND THEN YOU MAY HAVE A DEDUCTIBLE THAT WILL NEED TO BE MET. SO PLEASE MAKE SURE YOU CHECK YOUR BENEFITS FOR A SCREENING  COLONOSCOPY AS WELL AS A DIAGNOSTIC COLONOSCOPY.

## 2018-11-20 NOTE — Addendum Note (Signed)
Addended by: Mahala Menghini on: 11/20/2018 05:02 PM   Modules accepted: Orders

## 2018-11-22 ENCOUNTER — Encounter: Payer: Self-pay | Admitting: *Deleted

## 2018-11-22 NOTE — Addendum Note (Signed)
Addended by: Metro Kung on: 11/22/2018 07:29 AM   Modules accepted: Orders, SmartSet

## 2018-11-22 NOTE — Addendum Note (Signed)
Addended by: Metro Kung on: 11/22/2018 07:37 AM   Modules accepted: Miquel Dunn

## 2018-11-22 NOTE — Progress Notes (Signed)
Mailing out letter with diabetes medication adjustments.   

## 2019-01-16 ENCOUNTER — Other Ambulatory Visit: Payer: Self-pay | Admitting: Family Medicine

## 2019-01-21 ENCOUNTER — Other Ambulatory Visit: Payer: Self-pay

## 2019-01-21 ENCOUNTER — Ambulatory Visit (INDEPENDENT_AMBULATORY_CARE_PROVIDER_SITE_OTHER): Payer: 59 | Admitting: Family Medicine

## 2019-01-21 ENCOUNTER — Encounter: Payer: Self-pay | Admitting: Family Medicine

## 2019-01-21 VITALS — BP 140/90 | HR 87 | Temp 97.0°F | Resp 15 | Ht 63.5 in | Wt 193.0 lb

## 2019-01-21 DIAGNOSIS — Z0000 Encounter for general adult medical examination without abnormal findings: Secondary | ICD-10-CM

## 2019-01-21 DIAGNOSIS — E785 Hyperlipidemia, unspecified: Secondary | ICD-10-CM

## 2019-01-21 DIAGNOSIS — E669 Obesity, unspecified: Secondary | ICD-10-CM

## 2019-01-21 DIAGNOSIS — Z5320 Procedure and treatment not carried out because of patient's decision for unspecified reasons: Secondary | ICD-10-CM

## 2019-01-21 DIAGNOSIS — R7301 Impaired fasting glucose: Secondary | ICD-10-CM

## 2019-01-21 DIAGNOSIS — E1165 Type 2 diabetes mellitus with hyperglycemia: Secondary | ICD-10-CM

## 2019-01-21 DIAGNOSIS — E559 Vitamin D deficiency, unspecified: Secondary | ICD-10-CM

## 2019-01-21 DIAGNOSIS — Z23 Encounter for immunization: Secondary | ICD-10-CM | POA: Diagnosis not present

## 2019-01-21 DIAGNOSIS — Z794 Long term (current) use of insulin: Secondary | ICD-10-CM

## 2019-01-21 DIAGNOSIS — E66811 Obesity, class 1: Secondary | ICD-10-CM

## 2019-01-21 DIAGNOSIS — I10 Essential (primary) hypertension: Secondary | ICD-10-CM

## 2019-01-21 MED ORDER — BENAZEPRIL HCL 10 MG PO TABS: 10.0000 mg | ORAL_TABLET | Freq: Every day | ORAL | 3 refills | Status: DC

## 2019-01-21 NOTE — Patient Instructions (Addendum)
F/U in office in 4 months, with mD , call if you needme sooner  Flu vaccine today'  Lab today at hospital., lipid, cmp and eGFr, hBa1C, cBC and vit D  New for blood pressure and kidney is benazepril 10 mg daily  New for heart protection coated aspirin 81 mg daily  Please get eye exam.and mammogram Hope you are able to get your colonoscopy as planned  It is important that you exercise regularly at least 30 minutes 5 times a week. If you develop chest pain, have severe difficulty breathing, or feel very tired, stop exercising immediately and seek medical attention   Think about what you will eat, plan ahead. Choose " clean, green, fresh or frozen" over canned, processed or packaged foods which are more sugary, salty and fatty. 70 to 75% of food eaten should be vegetables and fruit. Three meals at set times with snacks allowed between meals, but they must be fruit or vegetables. Aim to eat over a 12 hour period , example 7 am to 7 pm, and STOP after  your last meal of the day. Drink water,generally about 64 ounces per day, no other drink is as healthy. Fruit juice is best enjoyed in a healthy way, by EATING the fruit. Thanks for choosing [facility], we consider it a privelige to serve you.

## 2019-01-23 NOTE — Assessment & Plan Note (Signed)
Takes crestor once weekly, reportd in 01/2019

## 2019-01-23 NOTE — Assessment & Plan Note (Signed)
Caitlin Thompson is reminded of the importance of commitment to daily physical activity for 30 minutes or more, as able and the need to limit carbohydrate intake to 30 to 60 grams per meal to help with blood sugar control.   The need to take medication as prescribed, test blood sugar as directed, and to call between visits if there is a concern that blood sugar is uncontrolled is also discussed.   Caitlin Thompson is reminded of the importance of daily foot exam, annual eye examination, and good blood sugar, blood pressure and cholesterol control.  Diabetic Labs Latest Ref Rng & Units 09/27/2018 05/22/2018 05/17/2018 01/11/2018 09/10/2017  HbA1c <5.7 % of total Hgb 8.3(H) - 8.0(H) 7.2(H) 7.3(H)  Microalbumin mg/dL 58.6 439.5(H) - - -  Micro/Creat Ratio <30 mcg/mg creat 310(H) 389(H) - - -  Chol <200 mg/dL 243(H) - 236(H) 208(H) 225(H)  HDL > OR = 50 mg/dL 60 - 60 55 57  Calc LDL mg/dL (calc) 161(H) - 151(H) 125(H) 145(H)  Triglycerides <150 mg/dL 102 - 129 142 117  Creatinine 0.50 - 1.05 mg/dL 0.87 - 0.75 0.68 0.74   BP/Weight 01/21/2019 09/30/2018 05/21/2018 03/06/2018 03/05/2018 Q000111Q [id number]  Systolic BP XX123456 [id number] Q000111Q - A999333 123456  Diastolic BP 90 88 76 97 - 84 84  Wt. (Lbs) 193 194.12 199 - 185 198 195  BMI 33.65 33.85 34.7 32.77 - 32.95 32.45   Foot/eye exam completion dates Latest Ref Rng & Units 01/21/2019 01/15/2018  Eye Exam No Retinopathy - -  Foot exam Order - - -  Foot Form Completion - Done Done   Uncontrolled, updated lab needed

## 2019-01-23 NOTE — Assessment & Plan Note (Signed)
Annual exam as documented. Counseling done  re healthy lifestyle involving commitment to 150 minutes exercise per week, heart healthy diet, and attaining healthy weight.The importance of adequate sleep Changes in health habits are decided on by the patient with goals and time frames  set for achieving them. Immunization and cancer screening needs are specifically addressed at this visit.

## 2019-01-23 NOTE — Assessment & Plan Note (Signed)
Uncontrolled  Start benazepril daily DASH diet and commitment to daily physical activity for a minimum of 30 minutes discussed and encouraged, as a part of hypertension management. The importance of attaining a healthy weight is also discussed.  BP/Weight 01/21/2019 09/30/2018 05/21/2018 03/06/2018 03/05/2018 Q000111Q [id number]  Systolic BP [id number] Q000111Q - A999333 123456  Diastolic BP 90 88 76 97 - 84 84  Wt. (Lbs) 193 194.12 199 - 185 198 195  BMI 33.65 33.85 34.7 32.77 - 32.95 32.45

## 2019-01-23 NOTE — Progress Notes (Signed)
Caitlin Thompson     MRN: [id number]      DOB: 04-14-1960  HPI: Patient is in for annual physical exam.  Immunization is reviewed , and  updated    PE: BP 140/90   Pulse 87   Temp (!) 97 F (36.1 C) (Temporal)   Resp 15   Ht 5' 3.5" (1.613 m)   Wt 193 lb (87.5 kg)   SpO2 98%   BMI 33.65 kg/m   Pleasant  female, alert and oriented x 3, in no cardio-pulmonary distress. Afebrile. HEENT No facial trauma or asymetry. Sinuses non tender.  Extra occullar muscles intact.. External ears normal, . Neck: supple, no adenopathy,JVD or thyromegaly.No bruits.  Chest: Clear to ascultation bilaterally.No crackles or wheezes. Non tender to palpation  Breast: No asymetry,no masses or lumps. No tenderness. No nipple discharge or inversion. No axillary or supraclavicular adenopathy  Cardiovascular system; Heart sounds normal,  S1 and  S2 ,no S3.  No murmur, or thrill. Apical beat not displaced Peripheral pulses normal.  Abdomen: Soft, non tender, no organomegaly or masses. No bruits. Bowel sounds normal. No guarding, tenderness or rebound.    Musculoskeletal exam: Full ROM of spine, hips , shoulders and knees. No deformity ,swelling or crepitus noted. No muscle wasting or atrophy.   Neurologic: Cranial nerves 2 to 12 intact. Power, tone ,sensation and reflexes normal throughout. No disturbance in gait. No tremor.  Skin: Intact, no ulceration, erythema , scaling or rash noted. Psych; Normal mood and affect. Judgement and concentration normal   Assessment & Plan:  Annual physical exam Annual exam as documented. Counseling done  re healthy lifestyle involving commitment to 150 minutes exercise per week, heart healthy diet, and attaining healthy weight.The importance of adequate sleep Changes in health habits are decided on by the patient with goals and time frames  set for achieving them. Immunization and cancer screening needs are specifically addressed at this  visit.   Type 2 diabetes mellitus with hyperglycemia [facility]) Caitlin Thompson is reminded of the importance of commitment to daily physical activity for 30 minutes or more, as able and the need to limit carbohydrate intake to 30 to 60 grams per meal to help with blood sugar control.   The need to take medication as prescribed, test blood sugar as directed, and to call between visits if there is a concern that blood sugar is uncontrolled is also discussed.   Caitlin Thompson is reminded of the importance of daily foot exam, annual eye examination, and good blood sugar, blood pressure and cholesterol control.  Diabetic Labs Latest Ref Rng & Units 09/27/2018 05/22/2018 05/17/2018 01/11/2018 09/10/2017  HbA1c <5.7 % of total Hgb 8.3(H) - 8.0(H) 7.2(H) 7.3(H)  Microalbumin mg/dL 58.6 439.5(H) - - -  Micro/Creat Ratio <30 mcg/mg creat 310(H) 389(H) - - -  Chol <200 mg/dL 243(H) - 236(H) 208(H) 225(H)  HDL > OR = 50 mg/dL 60 - 60 55 57  Calc LDL mg/dL (calc) 161(H) - 151(H) 125(H) 145(H)  Triglycerides <150 mg/dL 102 - 129 142 117  Creatinine 0.50 - 1.05 mg/dL 0.87 - 0.75 0.68 0.74   BP/Weight 01/21/2019 09/30/2018 05/21/2018 03/06/2018 03/05/2018 Q000111Q 123XX123  Systolic BP XX123456 123XX123 [id number] Q000111Q - A999333 123456  Diastolic BP 90 88 76 97 - 84 84  Wt. (Lbs) 193 194.12 199 - 185 198 195  BMI 33.65 33.85 34.7 32.77 - 32.95 32.45   Foot/eye exam completion dates Latest Ref Rng & Units 01/21/2019 01/15/2018  Eye Exam  No Retinopathy - -  Foot exam Order - - -  Foot Form Completion - Done Done   Uncontrolled, updated lab needed    Essential hypertension Uncontrolled  Start benazepril daily DASH diet and commitment to daily physical activity for a minimum of 30 minutes discussed and encouraged, as a part of hypertension management. The importance of attaining a healthy weight is also discussed.  BP/Weight 01/21/2019 09/30/2018 05/21/2018 03/06/2018 03/05/2018 Q000111Q 123XX123  Systolic BP XX123456 [id number] Q000111Q - A999333 123456   Diastolic BP 90 88 76 97 - 84 84  Wt. (Lbs) 193 194.12 199 - 185 198 195  BMI 33.65 33.85 34.7 32.77 - 32.95 32.45       Statin declined Takes crestor once weekly, reportd in 01/2019

## 2019-01-28 ENCOUNTER — Other Ambulatory Visit (HOSPITAL_COMMUNITY)
Admission: RE | Admit: 2019-01-28 | Discharge: 2019-01-28 | Disposition: A | Discharge status: Home / Self Care | Payer: 59 | Source: Ambulatory Visit | Attending: Internal Medicine | Admitting: Internal Medicine

## 2019-01-28 DIAGNOSIS — Z01812 Encounter for preprocedural laboratory examination: Secondary | ICD-10-CM | POA: Insufficient documentation

## 2019-01-28 DIAGNOSIS — Z20828 Contact with and (suspected) exposure to other viral communicable diseases: Secondary | ICD-10-CM | POA: Insufficient documentation

## 2019-01-28 LAB — SARS CORONAVIRUS 2 (TAT 6-24 HRS): SARS Coronavirus 2: NEGATIVE

## 2019-01-29 ENCOUNTER — Encounter (HOSPITAL_COMMUNITY)
Admission: RE | Disposition: A | Discharge status: Home / Self Care | Payer: Self-pay | Source: Home / Self Care | Attending: Internal Medicine

## 2019-01-29 ENCOUNTER — Other Ambulatory Visit: Payer: Self-pay

## 2019-01-29 ENCOUNTER — Ambulatory Visit (HOSPITAL_COMMUNITY)
Admission: RE | Admit: 2019-01-29 | Discharge: 2019-01-29 | Disposition: A | Discharge status: Home / Self Care | Payer: 59 | Attending: Internal Medicine | Admitting: Internal Medicine

## 2019-01-29 ENCOUNTER — Encounter (HOSPITAL_COMMUNITY): Payer: Self-pay | Admitting: *Deleted

## 2019-01-29 DIAGNOSIS — K219 Gastro-esophageal reflux disease without esophagitis: Secondary | ICD-10-CM | POA: Diagnosis not present

## 2019-01-29 DIAGNOSIS — I10 Essential (primary) hypertension: Secondary | ICD-10-CM | POA: Diagnosis not present

## 2019-01-29 DIAGNOSIS — Z86010 Personal history of colonic polyps: Secondary | ICD-10-CM | POA: Insufficient documentation

## 2019-01-29 DIAGNOSIS — Z1211 Encounter for screening for malignant neoplasm of colon: Secondary | ICD-10-CM | POA: Diagnosis not present

## 2019-01-29 DIAGNOSIS — Z885 Allergy status to narcotic agent status: Secondary | ICD-10-CM | POA: Insufficient documentation

## 2019-01-29 DIAGNOSIS — K635 Polyp of colon: Secondary | ICD-10-CM | POA: Diagnosis not present

## 2019-01-29 DIAGNOSIS — Z880 Allergy status to penicillin: Secondary | ICD-10-CM | POA: Diagnosis not present

## 2019-01-29 DIAGNOSIS — E669 Obesity, unspecified: Secondary | ICD-10-CM | POA: Insufficient documentation

## 2019-01-29 DIAGNOSIS — Z7982 Long term (current) use of aspirin: Secondary | ICD-10-CM | POA: Insufficient documentation

## 2019-01-29 DIAGNOSIS — Z79899 Other long term (current) drug therapy: Secondary | ICD-10-CM | POA: Insufficient documentation

## 2019-01-29 DIAGNOSIS — Z794 Long term (current) use of insulin: Secondary | ICD-10-CM | POA: Diagnosis not present

## 2019-01-29 DIAGNOSIS — K5730 Diverticulosis of large intestine without perforation or abscess without bleeding: Secondary | ICD-10-CM | POA: Insufficient documentation

## 2019-01-29 DIAGNOSIS — Z882 Allergy status to sulfonamides status: Secondary | ICD-10-CM | POA: Insufficient documentation

## 2019-01-29 DIAGNOSIS — E785 Hyperlipidemia, unspecified: Secondary | ICD-10-CM | POA: Insufficient documentation

## 2019-01-29 DIAGNOSIS — E119 Type 2 diabetes mellitus without complications: Secondary | ICD-10-CM | POA: Insufficient documentation

## 2019-01-29 DIAGNOSIS — Z6832 Body mass index (BMI) 32.0-32.9, adult: Secondary | ICD-10-CM | POA: Diagnosis not present

## 2019-01-29 DIAGNOSIS — D123 Benign neoplasm of transverse colon: Secondary | ICD-10-CM | POA: Diagnosis not present

## 2019-01-29 HISTORY — PX: POLYPECTOMY: SHX5525

## 2019-01-29 HISTORY — PX: COLONOSCOPY: SHX5424

## 2019-01-29 LAB — GLUCOSE, CAPILLARY: Glucose-Capillary: 115 mg/dL — ABNORMAL HIGH (ref 70–99)

## 2019-01-29 SURGERY — COLONOSCOPY
Anesthesia: Moderate Sedation

## 2019-01-29 MED ORDER — MEPERIDINE HCL 100 MG/ML IJ SOLN
INTRAMUSCULAR | Status: DC | PRN
  Administered 2019-01-29: 15 mg via INTRAVENOUS
  Administered 2019-01-29: 25 mg via INTRAVENOUS

## 2019-01-29 MED ORDER — ONDANSETRON HCL 4 MG/2ML IJ SOLN
INTRAMUSCULAR | Status: DC | PRN
  Administered 2019-01-29: 4 mg via INTRAVENOUS

## 2019-01-29 MED ORDER — MIDAZOLAM HCL 5 MG/5ML IJ SOLN
INTRAMUSCULAR | Status: DC | PRN
  Administered 2019-01-29: 1 mg via INTRAVENOUS
  Administered 2019-01-29: 2 mg via INTRAVENOUS
  Administered 2019-01-29: 1 mg via INTRAVENOUS

## 2019-01-29 MED ORDER — ONDANSETRON HCL 4 MG/2ML IJ SOLN
INTRAMUSCULAR | Status: AC
  Filled 2019-01-29: qty 2

## 2019-01-29 MED ORDER — MEPERIDINE HCL 50 MG/ML IJ SOLN
INTRAMUSCULAR | Status: AC
  Filled 2019-01-29: qty 1

## 2019-01-29 MED ORDER — MIDAZOLAM HCL 5 MG/5ML IJ SOLN
INTRAMUSCULAR | Status: AC
  Filled 2019-01-29: qty 10

## 2019-01-29 MED ORDER — SODIUM CHLORIDE 0.9 % IV SOLN
INTRAVENOUS | Status: DC
  Administered 2019-01-29: 1000 mL via INTRAVENOUS

## 2019-01-29 NOTE — H&P (Signed)
_0 @   Primary Care Physician:  Fayrene Helper, MD Primary Gastroenterologist:  Dr. Gala Romney  Pre-Procedure History & Physical: HPI:  Caitlin Thompson is a 58 y.o. female here for surveillance colonoscopy.  History of colonic adenomas removed previously.  Past Medical History:  Diagnosis Date  . Adenomatous colon polyp 06/26/2011   05/17/11 Colonoscopy Dr Chauncy Lean adenoma Next colonoscopy 05/2016   . DERMATOMYCOSIS 05/23/2010   Qualifier: Diagnosis of  By: Moshe Cipro MD, Joycelyn Schmid    . Diabetes mellitus type II 2007  . GERD (gastroesophageal reflux disease) 04/03/2011   Non-critical Schatzki's ring, HH on EGD 05/17/11 Dr Gala Romney   . Hematuria   . HYDRONEPHROSIS, LEFT 08/30/2009   Qualifier: Diagnosis of  By: Claybon Jabs PA, Dawn    . HYPERLIPIDEMIA 12/01/2009   Qualifier: Diagnosis of  By: Claybon Jabs PA, [place]    . Hypertension   . Obesity (BMI 30.0-34.9) 07/15/2011  . Renal lithiasis 6/11   Hospitalized a [place] fr 3 days in June   . Schatzki's ring   . THYROID STIMULATING HORMONE, ABNORMAL 08/30/2009   Qualifier: Diagnosis of  By: Claybon Jabs PA, Dawn    . Tubular adenoma of colon 3/13    Past Surgical History:  Procedure Laterality Date  . COLONOSCOPY W/ POLYPECTOMY  05/17/11   Jami Ohlin-Tubular adenoma colon, benign SB bx  . ESOPHAGEAL DILATION  05/17/2011   Bary Limbach-Hiatal hernia/Incomplete noncritical Schatzki's ring/HH  . WISDOM TOOTH EXTRACTION      Prior to Admission medications   Medication Sig Start Date End Date Taking? Authorizing Provider  amLODipine (NORVASC) 5 MG tablet Take 5 mg by mouth daily.   Yes [provider]  Insulin Glargine (LANTUS SOLOSTAR) 100 UNIT/ML Solostar Pen Inject 70 Units into the skin daily. Patient taking differently: Inject 40-60 Units into the skin daily.  09/26/18  Yes Fayrene Helper, MD  naproxen sodium (ALEVE) 220 MG tablet Take 440 mg by mouth daily as needed (pain).   Yes [provider]  OVER THE COUNTER MEDICATION Take  2 tablets by mouth daily. "liver cleanse" otc supplement   Yes [provider]  OVER THE COUNTER MEDICATION Take 2 tablets by mouth daily. Caprylic supplement   Yes [provider]  aspirin EC 81 MG tablet Take 81 mg by mouth daily.    [provider]  benazepril (LOTENSIN) 10 MG tablet Take 1 tablet (10 mg total) by mouth daily. Patient not taking: Reported on 01/23/2019 01/21/19   Fayrene Helper, MD  blood glucose meter kit and supplies Dispense based on patient and insurance preference. Three times daily testing dx e11.65. 09/30/18   Fayrene Helper, MD  glucose blood [facility] VERIO) test strip USE ONE STRIP TO CHECK GLUCOSE THREE TIMES DAILY 01/16/19   Fayrene Helper, MD  hydrocortisone cream 1 % Apply 1 application topically 2 (two) times daily as needed for itching.    [provider]    Allergies as of 11/22/2018 - Review Complete 11/20/2018  Allergen Reaction Noted  . Codeine    . Parlodel [bromocriptine mesylate]  06/14/2012  . Penicillins Hives 10/09/2011  . Sulfa antibiotics Hives, Nausea And Vomiting, and Other (See Comments) 06/13/2011  . Sulfonamide derivatives Hives     Family History  Problem Relation Age of Onset  . Diabetes Mother   . Hypertension Mother   . Kidney failure Mother   . Diabetes Father   . Hypertension Father   . Kidney failure Father   . Diabetes Brother  x2  . Crohn's disease Sister   . Aneurysm Sister     Social History   Socioeconomic History  . Marital status: Single    Spouse name: Not on file  . Number of children: 1  . Years of education: Not on file  . Highest education level: Not on file  Occupational History  . 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 Financial resource strain: Not on file  . Food insecurity    Worry: Not on file    Inability: Not on file  . Transportation needs     Medical: Not on file    Non-medical: Not on file  Tobacco Use  . Smoking status: Never Smoker  . Smokeless tobacco: Never Used  Substance and Sexual Activity  . Alcohol use: No  . Drug use: No  . Sexual activity: Yes    Birth control/protection: None  Lifestyle  . Physical activity    Days per week: Not on file    Minutes per session: Not on file  . Stress: Not on file  Relationships  . Social Herbalist on phone: Not on file    Gets together: Not on file    Attends religious service: Not on file    Active member of club or organization: Not on file    Attends meetings of clubs or organizations: Not on file    Relationship status: Not on file  . Intimate partner violence    Fear of current or ex partner: Not on file    Emotionally abused: Not on file    Physically abused: Not on file    Forced sexual activity: Not on file  Other Topics Concern  . Not on file  Social History Narrative   1 GROWN DAUGHTER    Review of Systems: See HPI, otherwise negative ROS  Physical Exam: BP (!) 163/89   Pulse 90   Temp 98.6 F (37 C) (Oral)   Resp 16   Ht 5' 3.5" (1.613 m)   Wt 85.3 kg   LMP  (Approximate)   SpO2 97%   BMI 32.78 kg/m  General:   Alert,  Well-developed, well-nourished, pleasant and cooperative in NAD Neck:  Supple; no masses or thyromegaly. No significant cervical adenopathy. Lungs:  Clear throughout to auscultation.   No wheezes, crackles, or rhonchi. No acute distress. Heart:  Regular rate and rhythm; no murmurs, clicks, rubs,  or gallops. Abdomen: Non-distended, normal bowel sounds.  Soft and nontender without appreciable mass or hepatosplenomegaly.    Impression/Plan: 58 year old lady with a history of colonic polyps.  Here for surveillance colonoscopy.  The risks, benefits, limitations, alternatives and imponderables have been reviewed with the patient. Questions have been answered. All parties are agreeable.      Notice: This dictation was  prepared with Dragon dictation along with smaller phrase technology. Any transcriptional errors that result from this process are unintentional and may not be corrected upon review.

## 2019-01-29 NOTE — Discharge Instructions (Signed)
Colonoscopy Discharge Instructions  Read the instructions outlined below and refer to this sheet in the next few weeks. These discharge instructions provide you with general information on caring for yourself after you leave the hospital. Your doctor may also give you specific instructions. While your treatment has been planned according to the most current medical practices available, unavoidable complications occasionally occur. If you have any problems or questions after discharge, call Dr. Gala Romney at [phone]. ACTIVITY  You may resume your regular activity, but move at a slower pace for the next 24 hours.   Take frequent rest periods for the next 24 hours.   Walking will help get rid of the air and reduce the bloated feeling in your belly (abdomen).   No driving for 24 hours (because of the medicine (anesthesia) used during the test).    Do not sign any important legal documents or operate any machinery for 24 hours (because of the anesthesia used during the test).  NUTRITION  Drink plenty of fluids.   You may resume your normal diet as instructed by your doctor.   Begin with a light meal and progress to your normal diet. Heavy or fried foods are harder to digest and may make you feel sick to your stomach (nauseated).   Avoid alcoholic beverages for 24 hours or as instructed.  MEDICATIONS  You may resume your normal medications unless your doctor tells you otherwise.  WHAT YOU CAN EXPECT TODAY  Some feelings of bloating in the abdomen.   Passage of more gas than usual.   Spotting of blood in your stool or on the toilet paper.  IF YOU HAD POLYPS REMOVED DURING THE COLONOSCOPY:  No aspirin products for 7 days or as instructed.   No alcohol for 7 days or as instructed.   Eat a soft diet for the next 24 hours.  FINDING OUT THE RESULTS OF YOUR TEST Not all test results are available during your visit. If your test results are not back during the visit, make an appointment  with your caregiver to find out the results. Do not assume everything is normal if you have not heard from your caregiver or the medical facility. It is important for you to follow up on all of your test results.  SEEK IMMEDIATE MEDICAL ATTENTION IF:  You have more than a spotting of blood in your stool.   Your belly is swollen (abdominal distention).   You are nauseated or vomiting.   You have a temperature over 101.   You have abdominal pain or discomfort that is severe    Diverticulosis and colon polyp information provided  Further recommendations to follow pending review of pathology report  At patient request, I spoke to [facility], daughter, at [phone]   Diverticulosis  Diverticulosis is a condition that develops when small pouches (diverticula) form in the wall of the large intestine (colon). The colon is where water is absorbed and stool is formed. The pouches form when the inside layer of the colon pushes through weak spots in the outer layers of the colon. You may have a few pouches or many of them. What are the causes? The cause of this condition is not known. What increases the risk? The following factors may make you more likely to develop this condition:  Being older than age 24. Your risk for this condition increases with age. Diverticulosis is rare among people younger than age 33. By age 42, many people have it.  Eating a low-fiber diet.  Having frequent constipation.  Being overweight.  Not getting enough exercise.  Smoking.  Taking over-the-counter pain medicines, like aspirin and ibuprofen.  Having a family history of diverticulosis. What are the signs or symptoms? In most people, there are no symptoms of this condition. If you do have symptoms, they may include:  Bloating.  Cramps in the abdomen.  Constipation or diarrhea.  Pain in the lower left side of the abdomen. How is this diagnosed? This condition is most often diagnosed during  an exam for other colon problems. Because diverticulosis usually has no symptoms, it often cannot be diagnosed independently. This condition may be diagnosed by:  Using a flexible scope to examine the colon (colonoscopy).  Taking an X-ray of the colon after dye has been put into the colon (barium enema).  Doing a CT scan. How is this treated? You may not need treatment for this condition if you have never developed an infection related to diverticulosis. If you have had an infection before, treatment may include:  Eating a high-fiber diet. This may include eating more fruits, vegetables, and grains.  Taking a fiber supplement.  Taking a live bacteria supplement (probiotic).  Taking medicine to relax your colon.  Taking antibiotic medicines. Follow these instructions at home:  Drink 6-8 glasses of water or more each day to prevent constipation.  Try not to strain when you have a bowel movement.  If you have had an infection before: ? Eat more fiber as directed by your health care provider or your diet and nutrition specialist (dietitian). ? Take a fiber supplement or probiotic, if your health care provider approves.  Take over-the-counter and prescription medicines only as told by your health care provider.  If you were prescribed an antibiotic, take it as told by your health care provider. Do not stop taking the antibiotic even if you start to feel better.  Keep all follow-up visits as told by your health care provider. This is important. Contact a health care provider if:  You have pain in your abdomen.  You have bloating.  You have cramps.  You have not had a bowel movement in 3 days. Get help right away if:  Your pain gets worse.  Your bloating becomes very bad.  You have a fever or chills, and your symptoms suddenly get worse.  You vomit.  You have bowel movements that are bloody or black.  You have bleeding from your rectum. Summary  Diverticulosis is a  condition that develops when small pouches (diverticula) form in the wall of the large intestine (colon).  You may have a few pouches or many of them.  This condition is most often diagnosed during an exam for other colon problems.  If you have had an infection related to diverticulosis, treatment may include increasing the fiber in your diet, taking supplements, or taking medicines. This information is not intended to replace advice given to you by your health care provider. Make sure you discuss any questions you have with your health care provider. Document Released: 11/18/2003 Document Revised: 02/02/2017 Document Reviewed: 01/10/2016 Elsevier Patient Education  [place].  Colon Polyps  Polyps are tissue growths inside the body. Polyps can grow in many places, including the large intestine (colon). A polyp may be a round bump or a mushroom-shaped growth. You could have one polyp or several. Most colon polyps are noncancerous (benign). However, some colon polyps can become cancerous over time. Finding and removing the polyps early can help prevent this. What  are the causes? The exact cause of colon polyps is not known. What increases the risk? You are more likely to develop this condition if you:  Have a family history of colon cancer or colon polyps.  Are older than 9 or older than 45 if you are African American.  Have inflammatory bowel disease, such as ulcerative colitis or Crohn's disease.  Have certain hereditary conditions, such as: ? Familial adenomatous polyposis. ? Lynch syndrome. ? Turcot syndrome. ? Peutz-Jeghers syndrome.  Are overweight.  Smoke cigarettes.  Do not get enough exercise.  Drink too much alcohol.  Eat a diet that is high in fat and red meat and low in fiber.  Had childhood cancer that was treated with abdominal radiation. What are the signs or symptoms? Most polyps do not cause symptoms. If you have symptoms, they may  include:  Blood coming from your rectum when having a bowel movement.  Blood in your stool. The stool may look dark red or black.  Abdominal pain.  A change in bowel habits, such as constipation or diarrhea. How is this diagnosed? This condition is diagnosed with a colonoscopy. This is a procedure in which a lighted, flexible scope is inserted into the anus and then passed into the colon to examine the area. Polyps are sometimes found when a colonoscopy is done as part of routine cancer screening tests. How is this treated? Treatment for this condition involves removing any polyps that are found. Most polyps can be removed during a colonoscopy. Those polyps will then be tested for cancer. Additional treatment may be needed depending on the results of testing. Follow these instructions at home: Lifestyle  Maintain a healthy weight, or lose weight if recommended by your health care provider.  Exercise every day or as told by your health care provider.  Do not use any products that contain nicotine or tobacco, such as cigarettes and e-cigarettes. If you need help quitting, ask your health care provider.  If you drink alcohol, limit how much you have: ? 0-1 drink a day for women. ? 0-2 drinks a day for men.  Be aware of how much alcohol is in your drink. In the [place] one drink equals one 12 oz bottle of beer (355 mL), one 5 oz glass of wine (148 mL), or one 1 oz shot of hard liquor (44 mL). Eating and drinking   Eat foods that are high in fiber, such as fruits, vegetables, and whole grains.  Eat foods that are high in calcium and vitamin D, such as milk, cheese, yogurt, eggs, liver, fish, and broccoli.  Limit foods that are high in fat, such as fried foods and desserts.  Limit the amount of red meat and processed meat you eat, such as hot dogs, sausage, bacon, and lunch meats. General instructions  Keep all follow-up visits as told by your health care provider. This is  important. ? This includes having regularly scheduled colonoscopies. ? Talk to your health care provider about when you need a colonoscopy. Contact a health care provider if:  You have new or worsening bleeding during a bowel movement.  You have new or increased blood in your stool.  You have a change in bowel habits.  You lose weight for no known reason. Summary  Polyps are tissue growths inside the body. Polyps can grow in many places, including the colon.  Most colon polyps are noncancerous (benign), but some can become cancerous over time.  This condition is diagnosed with a colonoscopy.  Treatment for this condition involves removing any polyps that are found. Most polyps can be removed during a colonoscopy. This information is not intended to replace advice given to you by your health care provider. Make sure you discuss any questions you have with your health care provider. Document Released: 11/17/2003 Document Revised: 06/07/2017 Document Reviewed: 06/07/2017 Elsevier Patient Education  2020 [employer].

## 2019-01-29 NOTE — Op Note (Signed)
[facility] Patient Name: Caitlin Thompson Procedure Date: 01/29/2019 1:19 PM MRN: [id number] Date of Birth: 1960-04-18 Attending MD: Norvel Richards , MD CSN: [id number] Age: 58 Admit Type: Outpatient Procedure:                Colonoscopy Indications:              High risk colon cancer surveillance: Personal                            history of colonic polyps Providers:                Norvel Richards, MD, Charlsie Quest. Theda Sers RN, RN,                            Raphael Gibney, Technician Referring MD:              Medicines:                Midazolam 4 mg IV, Meperidine 40 mg IV, Ondansetron                            4 mg IV Complications:            No immediate complications. Estimated Blood Loss:     Estimated blood loss was minimal. Procedure:                Pre-Anesthesia Assessment:                           - Prior to the procedure, a History and Physical                            was performed, and patient medications and                            allergies were reviewed. The patient's tolerance of                            previous anesthesia was also reviewed. The risks                            and benefits of the procedure and the sedation                            options and risks were discussed with the patient.                            All questions were answered, and informed consent                            was obtained. Prior Anticoagulants: The patient has                            taken no previous anticoagulant or antiplatelet  agents. ASA Grade Assessment: II - A patient with                            mild systemic disease. After reviewing the risks                            and benefits, the patient was deemed in                            satisfactory condition to undergo the procedure.                           After obtaining informed consent, the colonoscope                            was passed under  direct vision. Throughout the                            procedure, the patient's blood pressure, pulse, and                            oxygen saturations were monitored continuously. The                            [id number]) scope was introduced through                            the anus and advanced to the the cecum, identified                            by appendiceal orifice and ileocecal valve. The                            colonoscopy was performed without difficulty. The                            patient tolerated the procedure well. The quality                            of the bowel preparation was adequate. Scope In: 1:39:33 PM Scope Out: 1:51:00 PM Scope Withdrawal Time: 0 hours 6 minutes 50 seconds  Total Procedure Duration: 0 hours 11 minutes 27 seconds  Findings:      The perianal and digital rectal examinations were normal.      Scattered medium-mouthed diverticula were found in the sigmoid colon and       ascending colon.      A 5 mm polyp was found in the splenic flexure. The polyp was sessile.       The polyp was removed with a cold snare. Resection and retrieval were       complete. Estimated blood loss was minimal.      The exam was otherwise without abnormality on direct and retroflexion       views. Impression:               - Diverticulosis  in the sigmoid colon and in the                            ascending colon.                           - One 5 mm polyp at the splenic flexure, removed                            with a cold snare. Resected and retrieved.                           - The examination was otherwise normal on direct                            and retroflexion views. Moderate Sedation:      Moderate (conscious) sedation was administered by the endoscopy nurse       and supervised by the endoscopist. The following parameters were       monitored: oxygen saturation, heart rate, blood pressure, respiratory       rate, EKG, adequacy of  pulmonary ventilation, and response to care.       Total physician intraservice time was 15 minutes. Recommendation:           - Written discharge instructions were provided to                            the patient.                           - The signs and symptoms of potential delayed                            complications were discussed with the patient.                           - Patient has a contact number available for                            emergencies.                           - Return to normal activities tomorrow.                           - Advance diet as tolerated.                           - Continue present medications.                           - Repeat colonoscopy date to be determined after                            pending pathology results are reviewed for  surveillance.                           - Return to GI office (date not yet determined). Procedure Code(s):        --- Professional ---                           [phone], Colonoscopy, flexible; with removal of                            tumor(s), polyp(s), or other lesion(s) by snare                            technique                           G0500, Moderate sedation services provided by the                            same physician or other qualified health care                            professional performing a gastrointestinal                            endoscopic service that sedation supports,                            requiring the presence of an independent trained                            observer to assist in the monitoring of the                            patient's level of consciousness and physiological                            status; initial 15 minutes of intra-service time;                            patient age 47 years or older (additional time may                            be reported with [phone], as appropriate) Diagnosis Code(s):        ---  Professional ---                           Z86.010, Personal history of colonic polyps                           K63.5, Polyp of colon                           K57.30, Diverticulosis of large intestine without  perforation or abscess without bleeding CPT copyright 2019 American Medical Association. All rights reserved. The codes documented in this report are preliminary and upon coder review may  be revised to meet current compliance requirements. Cristopher Estimable. Jerred Zaremba, MD Norvel Richards, MD 01/29/2019 1:58:49 PM This report has been signed electronically. Number of Addenda: 0

## 2019-02-03 ENCOUNTER — Ambulatory Visit (HOSPITAL_COMMUNITY)
Admission: RE | Admit: 2019-02-03 | Discharge: 2019-02-03 | Disposition: A | Discharge status: Home / Self Care | Payer: 59 | Source: Ambulatory Visit | Attending: Family Medicine | Admitting: Family Medicine

## 2019-02-03 ENCOUNTER — Other Ambulatory Visit: Payer: Self-pay

## 2019-02-03 ENCOUNTER — Other Ambulatory Visit (HOSPITAL_COMMUNITY)
Admission: RE | Admit: 2019-02-03 | Discharge: 2019-02-03 | Disposition: A | Discharge status: Home / Self Care | Payer: 59 | Source: Ambulatory Visit | Attending: Family Medicine | Admitting: Family Medicine

## 2019-02-03 DIAGNOSIS — Z1231 Encounter for screening mammogram for malignant neoplasm of breast: Secondary | ICD-10-CM | POA: Insufficient documentation

## 2019-02-03 DIAGNOSIS — E785 Hyperlipidemia, unspecified: Secondary | ICD-10-CM | POA: Diagnosis present

## 2019-02-03 DIAGNOSIS — E559 Vitamin D deficiency, unspecified: Secondary | ICD-10-CM

## 2019-02-03 DIAGNOSIS — I10 Essential (primary) hypertension: Secondary | ICD-10-CM | POA: Insufficient documentation

## 2019-02-03 DIAGNOSIS — R7301 Impaired fasting glucose: Secondary | ICD-10-CM | POA: Insufficient documentation

## 2019-02-03 LAB — COMPREHENSIVE METABOLIC PANEL
ALT: 19 U/L (ref 0–44)
AST: 16 U/L (ref 15–41)
Albumin: 3.9 g/dL (ref 3.5–5.0)
Alkaline Phosphatase: 72 U/L (ref 38–126)
Anion gap: 12 (ref 5–15)
BUN: 16 mg/dL (ref 6–20)
CO2: 25 mmol/L (ref 22–32)
Calcium: 9.5 mg/dL (ref 8.9–10.3)
Chloride: 102 mmol/L (ref 98–111)
Creatinine, Ser: 0.77 mg/dL (ref 0.44–1.00)
GFR calc Af Amer: >60 mL/min (ref >60)
GFR calc non Af Amer: >60 mL/min (ref >60)
Glucose, Bld: 187 mg/dL — ABNORMAL HIGH (ref 70–99)
Potassium: 3.8 mmol/L (ref 3.5–5.1)
Sodium: 139 mmol/L (ref 135–145)
Total Bilirubin: 0.4 mg/dL (ref 0.3–1.2)
Total Protein: 8.3 g/dL — ABNORMAL HIGH (ref 6.5–8.1)

## 2019-02-03 LAB — LIPID PANEL
Cholesterol: 227 mg/dL — ABNORMAL HIGH (ref 0–200)
HDL: 65 mg/dL (ref >40)
LDL Cholesterol: 134 mg/dL — ABNORMAL HIGH (ref 0–99)
Total CHOL/HDL Ratio: 3.5 RATIO
Triglycerides: 139 mg/dL (ref <150)
VLDL: 28 mg/dL (ref 0–40)

## 2019-02-03 LAB — CBC
HCT: 42 % (ref 36.0–46.0)
Hemoglobin: 13.4 g/dL (ref 12.0–15.0)
MCH: 30.9 pg (ref 26.0–34.0)
MCHC: 31.9 g/dL (ref 30.0–36.0)
MCV: 97 fL (ref 80.0–100.0)
Platelets: 334 10*3/uL (ref 150–400)
RBC: 4.33 MIL/uL (ref 3.87–5.11)
RDW: 12 % (ref 11.5–15.5)
WBC: 9.5 10*3/uL (ref 4.0–10.5)
nRBC: 0 % (ref 0.0–0.2)

## 2019-02-03 LAB — HEMOGLOBIN A1C
Hgb A1c MFr Bld: 8.8 % — ABNORMAL HIGH (ref 4.8–5.6)
Mean Plasma Glucose: 205.86 mg/dL

## 2019-02-03 LAB — VITAMIN D 25 HYDROXY (VIT D DEFICIENCY, FRACTURES): Vit D, 25-Hydroxy: 31.58 ng/mL (ref 30–100)

## 2019-02-04 ENCOUNTER — Encounter: Payer: Self-pay | Admitting: Family Medicine

## 2019-02-04 ENCOUNTER — Encounter (HOSPITAL_COMMUNITY): Payer: Self-pay | Admitting: Internal Medicine

## 2019-02-04 LAB — SURGICAL PATHOLOGY

## 2019-02-05 ENCOUNTER — Encounter: Payer: Self-pay | Admitting: Internal Medicine

## 2019-02-11 ENCOUNTER — Telehealth: Payer: Self-pay

## 2019-02-11 DIAGNOSIS — I10 Essential (primary) hypertension: Secondary | ICD-10-CM

## 2019-02-11 DIAGNOSIS — E785 Hyperlipidemia, unspecified: Secondary | ICD-10-CM

## 2019-02-11 DIAGNOSIS — E1165 Type 2 diabetes mellitus with hyperglycemia: Secondary | ICD-10-CM

## 2019-02-11 DIAGNOSIS — Z794 Long term (current) use of insulin: Secondary | ICD-10-CM

## 2019-02-11 NOTE — Telephone Encounter (Signed)
-----   Message from Fayrene Helper, MD sent at 02/11/2019  5:23 AM EST ----- Noted, pls order fasting lipid, cmp and EGR, hBA1c to be done 3 to 5 days before follow up visit in March and let her know, thanks!

## 2019-04-21 ENCOUNTER — Other Ambulatory Visit: Payer: Self-pay

## 2019-04-21 DIAGNOSIS — E785 Hyperlipidemia, unspecified: Secondary | ICD-10-CM

## 2019-04-21 DIAGNOSIS — I10 Essential (primary) hypertension: Secondary | ICD-10-CM

## 2019-04-21 DIAGNOSIS — Z794 Long term (current) use of insulin: Secondary | ICD-10-CM

## 2019-04-21 DIAGNOSIS — E1165 Type 2 diabetes mellitus with hyperglycemia: Secondary | ICD-10-CM

## 2019-05-01 ENCOUNTER — Encounter: Payer: Self-pay | Admitting: Family Medicine

## 2019-05-01 ENCOUNTER — Ambulatory Visit (INDEPENDENT_AMBULATORY_CARE_PROVIDER_SITE_OTHER): Payer: 59 | Admitting: Family Medicine

## 2019-05-01 ENCOUNTER — Other Ambulatory Visit: Payer: Self-pay

## 2019-05-01 VITALS — BP 134/72 | Ht 63.5 in | Wt 188.0 lb

## 2019-05-01 DIAGNOSIS — I10 Essential (primary) hypertension: Secondary | ICD-10-CM

## 2019-05-01 DIAGNOSIS — M25512 Pain in left shoulder: Secondary | ICD-10-CM | POA: Insufficient documentation

## 2019-05-01 DIAGNOSIS — E1165 Type 2 diabetes mellitus with hyperglycemia: Secondary | ICD-10-CM

## 2019-05-01 DIAGNOSIS — M47812 Spondylosis without myelopathy or radiculopathy, cervical region: Secondary | ICD-10-CM | POA: Diagnosis not present

## 2019-05-01 DIAGNOSIS — Z794 Long term (current) use of insulin: Secondary | ICD-10-CM

## 2019-05-01 MED ORDER — PREDNISONE 10 MG PO TABS: 10.0000 mg | ORAL_TABLET | Freq: Two times a day (BID) | ORAL | 0 refills | Status: DC

## 2019-05-01 MED ORDER — FAMOTIDINE 40 MG PO TABS: 40.0000 mg | ORAL_TABLET | Freq: Every day | ORAL | 0 refills | Status: DC

## 2019-05-01 MED ORDER — CYCLOBENZAPRINE HCL 10 MG PO TABS: ORAL_TABLET | ORAL | 0 refills | Status: DC

## 2019-05-01 MED ORDER — IBUPROFEN 800 MG PO TABS: ORAL_TABLET | ORAL | 0 refills | Status: DC

## 2019-05-01 NOTE — Assessment & Plan Note (Signed)
acute ppain x 3 days radiating to left greater than right upper arms, short course ibuprofen, prednisone and pepcid, also bedtime flexeril

## 2019-05-01 NOTE — Progress Notes (Signed)
Virtual Visit via Telephone Note  I connected with Caitlin Thompson on 05/01/19 at  3:00 PM EST by telephone and verified that I am speaking with the correct person using two identifiers.  Location: Patient: home Provider: office    I discussed the limitations, risks, security and privacy concerns of performing an evaluation and management service by telephone and the availability of in person appointments. I also discussed with the patient that there may be a patient responsible charge related to this service. The patient expressed understanding and agreed to proceed.   History of Present Illness: 4 day h/o uncontrolled left shoulder and neck pain and spasm after falling against the wall in her bath. Movement  Of the shoulder is limited by pain Denies polyuria, polydipsia, blurred vision , or hypoglycemic episodes. Otherwise no concerns Denies recent fever or chills. Denies sinus pressure, nasal congestion, ear pain or sore throat. Denies chest congestion, productive cough or wheezing. Denies chest pains, palpitations and leg swelling  Denies skin break down or rash.       Observations/Objective: BP 134/72   Ht 5' 3.5" (1.613 m)   Wt 188 lb (85.3 kg)   BMI 32.78 kg/m  Good communication with no confusion and intact memory. Alert and oriented x 3 No signs of respiratory distress during speech    Assessment and Plan: Spondylosis, cervical acute ppain x 3 days radiating to left greater than right upper arms, short course ibuprofen, prednisone and pepcid, also bedtime flexeril  Left shoulder pain Acute pain following trauma, short course of anti inflammatory and muscle relaxant  Essential hypertension Controlled, no change in medication DASH diet and commitment to daily physical activity for a minimum of 30 minutes discussed and encouraged, as a part of hypertension management. The importance of attaining a healthy weight is also discussed.  BP/Weight 05/01/2019  01/29/2019 01/21/2019 09/30/2018 05/21/2018 03/06/2018 [id number]  Systolic BP Q000111Q Q000111Q [id number] Q000111Q -  Diastolic BP 72 72 90 88 76 97 -  Wt. (Lbs) 188 188 193 194.12 199 - 185  BMI 32.78 32.78 33.65 33.85 34.7 32.77 -       Type 2 diabetes mellitus with hyperglycemia ([place]) Updated lab needed at/ before next visit.     Follow Up Instructions:    I discussed the assessment and treatment plan with the patient. The patient was provided an opportunity to ask questions and all were answered. The patient agreed with the plan and demonstrated an understanding of the instructions.   The patient was advised to call back or seek an in-person evaluation if the symptoms worsen or if the condition fails to improve as anticipated.  I provided 12 minutes of non-face-to-face time during this encounter.   Tula Nakayama, MD

## 2019-05-01 NOTE — Patient Instructions (Signed)
F/U as before, please get labs  5 days before next visit. Call if you need me sooner  Medications are prescribed for neck and upper arm and shoulder pain following recent fall, these are ibuprofen, prednisone, pepcid and flexeril  Thanks for choosing [facility], we consider it a privelige to serve you.   Hope that you will soon feel better

## 2019-05-04 ENCOUNTER — Encounter: Payer: Self-pay | Admitting: Family Medicine

## 2019-05-04 NOTE — Assessment & Plan Note (Signed)
Acute pain following trauma, short course of anti inflammatory and muscle relaxant

## 2019-05-04 NOTE — Assessment & Plan Note (Signed)
Updated lab needed at/ before next visit.   

## 2019-05-04 NOTE — Assessment & Plan Note (Signed)
Controlled, no change in medication DASH diet and commitment to daily physical activity for a minimum of 30 minutes discussed and encouraged, as a part of hypertension management. The importance of attaining a healthy weight is also discussed.  BP/Weight 05/01/2019 01/29/2019 01/21/2019 09/30/2018 05/21/2018 03/06/2018 [id number]  Systolic BP Q000111Q Q000111Q [id number] Q000111Q -  Diastolic BP 72 72 90 88 76 97 -  Wt. (Lbs) 188 188 193 194.12 199 - 185  BMI 32.78 32.78 33.65 33.85 34.7 32.77 -

## 2019-05-21 ENCOUNTER — Ambulatory Visit: Payer: 59 | Admitting: Family Medicine

## 2019-05-22 ENCOUNTER — Ambulatory Visit: Payer: 59 | Admitting: Family Medicine

## 2019-05-30 LAB — HM DIABETES EYE EXAM

## 2019-06-04 ENCOUNTER — Encounter: Payer: Self-pay | Admitting: Family Medicine

## 2019-06-04 ENCOUNTER — Ambulatory Visit: Payer: 59 | Admitting: Family Medicine

## 2019-06-04 ENCOUNTER — Other Ambulatory Visit: Payer: Self-pay

## 2019-06-04 VITALS — BP 162/95 | HR 92 | Temp 97.6°F | Ht 63.5 in | Wt 195.0 lb

## 2019-06-04 DIAGNOSIS — N390 Urinary tract infection, site not specified: Secondary | ICD-10-CM | POA: Diagnosis not present

## 2019-06-04 LAB — POCT URINALYSIS DIP (MANUAL ENTRY)
Bilirubin, UA: NEGATIVE
Glucose, UA: NEGATIVE mg/dL
Ketones, POC UA: NEGATIVE mg/dL
Nitrite, UA: NEGATIVE
Protein Ur, POC: >=300 mg/dL — AB
Spec Grav, UA: 1.025 (ref 1.010–1.025)
Urobilinogen, UA: 0.2 E.U./dL
pH, UA: 6.5 (ref 5.0–8.0)

## 2019-06-04 MED ORDER — NITROFURANTOIN MONOHYD MACRO 100 MG PO CAPS: 100.0000 mg | ORAL_CAPSULE | Freq: Two times a day (BID) | ORAL | 0 refills | Status: DC

## 2019-06-04 NOTE — Progress Notes (Signed)
Acute Office Visit  Subjective:    Patient ID: Caitlin Thompson, female    DOB: Nov 15, 1960, 59 y.o.   MRN: [id number]  Chief Complaint  Patient presents with  . Urinary Tract Infection    frequencey, burning and some pain when urinate    HPI Patient is in today for concern for UTI-burning, abdominal tenderness-midline, lower, no blood noted in urine. No fever. No back pain HTN-pt has not taken amlodipine this week since"I have not felt well" Past Medical History:  Diagnosis Date  . Adenomatous colon polyp 06/26/2011   05/17/11 Colonoscopy Dr Chauncy Lean adenoma Next colonoscopy 05/2016   . DERMATOMYCOSIS 05/23/2010   Qualifier: Diagnosis of  By: Moshe Cipro MD, Joycelyn Schmid    . Diabetes mellitus type II 2007  . GERD (gastroesophageal reflux disease) 04/03/2011   Non-critical Schatzki's ring, HH on EGD 05/17/11 Dr Gala Romney   . Hematuria   . HYDRONEPHROSIS, LEFT 08/30/2009   Qualifier: Diagnosis of  By: Claybon Jabs PA, Dawn    . HYPERLIPIDEMIA 12/01/2009   Qualifier: Diagnosis of  By: Claybon Jabs PA, [place]    . Hypertension   . Obesity (BMI 30.0-34.9) 07/15/2011  . Renal lithiasis 6/11   Hospitalized a [place] fr 3 days in June   . Schatzki's ring   . THYROID STIMULATING HORMONE, ABNORMAL 08/30/2009   Qualifier: Diagnosis of  By: Claybon Jabs PA, Dawn    . Tubular adenoma of colon 3/13    Past Surgical History:  Procedure Laterality Date  . COLONOSCOPY N/A 01/29/2019   Procedure: COLONOSCOPY;  Surgeon: Daneil Dolin, MD;  Location: [facility];  Service: Endoscopy;  Laterality: N/A;  2:00  . COLONOSCOPY W/ POLYPECTOMY  05/17/11   Rourk-Tubular adenoma colon, benign SB bx  . ESOPHAGEAL DILATION  05/17/2011   Rourk-Hiatal hernia/Incomplete noncritical Schatzki's ring/HH  . POLYPECTOMY  01/29/2019   Procedure: POLYPECTOMY;  Surgeon: Daneil Dolin, MD;  Location: [facility];  Service: Endoscopy;;  . WISDOM TOOTH EXTRACTION      Family History  Problem Relation Age of Onset  . Diabetes  Mother   . Hypertension Mother   . Kidney failure Mother   . Diabetes Father   . Hypertension Father   . Kidney failure Father   . Diabetes Brother        x2  . Crohn's disease Sister   . Aneurysm Sister     Social History   Socioeconomic History  . Marital status: Single    Spouse name: Not on file  . Number of children: 1  . Years of education: Not on file  . Highest education level: Not on file  Occupational History  . 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 Smoking status: Never Smoker  . Smokeless tobacco: Never Used  Substance and Sexual Activity  . Alcohol use: No  . Drug use: No  . Sexual activity: Yes    Birth control/protection: None  Other Topics Concern  . Not on file  Social History Narrative   1 GROWN DAUGHTER   Social Determinants of Health   Financial Resource Strain:   . Difficulty of Paying Living Expenses:   Food Insecurity:   . Worried About Charity fundraiser in the Last Year:   . Arboriculturist in the Last Year:   Transportation Needs:   . Film/video editor (Medical):   Marland Kitchen Lack of Transportation (  Non-Medical):   Physical Activity:   . Days of Exercise per Week:   . Minutes of Exercise per Session:   Stress:   . Feeling of Stress :   Social Connections:   . Frequency of Communication with Friends and Family:   . Frequency of Social Gatherings with Friends and Family:   . Attends Religious Services:   . Active Member of Clubs or Organizations:   . Attends Archivist Meetings:   Marland Kitchen Marital Status:   Intimate Partner Violence:   . Fear of Current or Ex-Partner:   . Emotionally Abused:   Marland Kitchen Physically Abused:   . Sexually Abused:     Outpatient Medications Prior to Visit  Medication Sig Dispense Refill  . amLODipine (NORVASC) 5 MG tablet Take 5 mg by mouth daily.    . blood glucose meter kit and supplies Dispense based on patient and  insurance preference. Three times daily testing dx e11.65. 1 each 0  . glucose blood (ONETOUCH VERIO) test strip USE ONE STRIP TO CHECK GLUCOSE THREE TIMES DAILY 50 each 0  . ibuprofen (ADVIL) 800 MG tablet Take one tablet by mouth two times daily for 5 days , then take one tablet once daily as needed, for neck and arm pain 20 tablet 0  . Insulin Glargine (LANTUS SOLOSTAR) 100 UNIT/ML Solostar Pen Inject 70 Units into the skin daily. (Patient taking differently: Inject 40-60 Units into the skin daily. ) 5 pen PRN  . OVER THE COUNTER MEDICATION Take 2 tablets by mouth daily. "liver cleanse" otc supplement    . OVER THE COUNTER MEDICATION Take 2 tablets by mouth daily. Caprylic supplement    . aspirin EC 81 MG tablet Take 81 mg by mouth daily.    . cyclobenzaprine (FLEXERIL) 10 MG tablet Take one tablet by mouth at bedtime , as needed, for neck and shoulder spasm 20 tablet 0  . famotidine (PEPCID) 40 MG tablet Take 1 tablet (40 mg total) by mouth daily. 14 tablet 0  . predniSONE (DELTASONE) 10 MG tablet Take 1 tablet (10 mg total) by mouth 2 (two) times daily with a meal. 10 tablet 0   No facility-administered medications prior to visit.    Allergies  Allergen Reactions  . Codeine Nausea And Vomiting  . Parlodel [Bromocriptine Mesylate]     headache  . Penicillins Hives    Did it involve swelling of the face/tongue/throat, SOB, or low BP? No Did it involve sudden or severe rash/hives, skin peeling, or any reaction on the inside of your mouth or nose? Yes Did you need to seek medical attention at a hospital or doctor's office? Yes When did it last happen?8 years ago If all above answers are "NO", may proceed with cephalosporin use.   . Sulfa Antibiotics Hives, Nausea And Vomiting and Other (See Comments)  . Sulfonamide Derivatives Hives    Review of Systems  Constitutional: Positive for fatigue. Negative for fever.  Cardiovascular: Negative.   Genitourinary: Positive for  difficulty urinating, dysuria, frequency and urgency. Negative for hematuria.  Neurological: Negative for dizziness.       Objective:    Physical Exam Cardiovascular:     Rate and Rhythm: Normal rate and regular rhythm.     Pulses: Normal pulses.     Heart sounds: Normal heart sounds.  Pulmonary:     Effort: Pulmonary effort is normal.     Breath sounds: Normal breath sounds.  Abdominal:     Tenderness: There is abdominal tenderness.  There is no right CVA tenderness or left CVA tenderness.  Neurological:     Mental Status: She is alert.     BP (!) 162/95   Pulse 92   Temp 97.6 F (36.4 C) (Temporal)   Ht 5' 3.5" (1.613 m)   Wt 195 lb (88.5 kg)   SpO2 96%   BMI 34.00 kg/m  Wt Readings from Last 3 Encounters:  06/04/19 195 lb (88.5 kg)  05/01/19 188 lb (85.3 kg)  01/29/19 188 lb (85.3 kg)    Health Maintenance Due  Topic Date Due  . OPHTHALMOLOGY EXAM  12/30/2014    Lab Results  Component Value Date   TSH 0.61 09/27/2018   Lab Results  Component Value Date   WBC 9.5 02/03/2019   HGB 13.4 02/03/2019   HCT 42.0 02/03/2019   MCV 97.0 02/03/2019   PLT 334 02/03/2019   Lab Results  Component Value Date   NA 139 02/03/2019   K 3.8 02/03/2019   CO2 25 02/03/2019   GLUCOSE 187 (H) 02/03/2019   BUN 16 02/03/2019   CREATININE 0.77 02/03/2019   BILITOT 0.4 02/03/2019   ALKPHOS 72 02/03/2019   AST 16 02/03/2019   ALT 19 02/03/2019   PROT 8.3 (H) 02/03/2019   ALBUMIN 3.9 02/03/2019   CALCIUM 9.5 02/03/2019   ANIONGAP 12 02/03/2019   Lab Results  Component Value Date   CHOL 227 (H) 02/03/2019   Lab Results  Component Value Date   HDL 65 02/03/2019   Lab Results  Component Value Date   LDLCALC 134 (H) 02/03/2019   Lab Results  Component Value Date   TRIG 139 02/03/2019   Lab Results  Component Value Date   CHOLHDL 3.5 02/03/2019   Lab Results  Component Value Date   HGBA1C 8.8 (H) 02/03/2019       Assessment & Plan:   1. Urinary tract  infection without hematuria, site unspecified macrobid-rx, increase water - POCT urinalysis dipstick - Urine Culture   Carmita Boom Hannah Beat, MD

## 2019-06-04 NOTE — Patient Instructions (Addendum)
  COVID-19 Vaccine Information can be found at: [email or web address] For questions related to vaccine distribution or appointments, please email [email or web address] or call [phone].     If you have lab work done today you will be contacted with your lab results within the next 2 weeks.  If you have not heard from us then please contact us. The fastest way to get your results is to register for My Chart.   IF you received an x-ray today, you will receive an invoice from [facility]. Please contact [facility] at [phone] with questions or concerns regarding your invoice.   IF you received labwork today, you will receive an invoice from [facility]. Please contact [facility] at [phone] with questions or concerns regarding your invoice.   Our billing staff will not be able to assist you with questions regarding bills from these companies.  You will be contacted with the lab results as soon as they are available. The fastest way to get your results is to activate your My Chart account. Instructions are located on the last page of this paperwork. If you have not heard from us regarding the results in 2 weeks, please contact this office.       

## 2019-06-06 LAB — URINE CULTURE
MICRO NUMBER:: 10314403
SPECIMEN QUALITY:: ADEQUATE

## 2019-06-09 ENCOUNTER — Other Ambulatory Visit: Payer: Self-pay | Admitting: Family Medicine

## 2019-06-09 MED ORDER — CIPROFLOXACIN HCL 250 MG PO TABS: 250.0000 mg | ORAL_TABLET | Freq: Two times a day (BID) | ORAL | 0 refills | Status: DC

## 2019-06-09 NOTE — Progress Notes (Signed)
Pt allergic to PEN and Sulfa Organism resistant to PEN and Macrobid cipro 250mg  BID x 5 days

## 2019-06-10 ENCOUNTER — Telehealth: Payer: Self-pay | Admitting: Family Medicine

## 2019-06-10 NOTE — Telephone Encounter (Signed)
Left a msg on machine that the rx was changed due to the urine culture came back and this first abx  med will not cover that infection she had and the new one that was sent in will clear the infection

## 2019-06-10 NOTE — Telephone Encounter (Signed)
Patient is calling and states she received a call stating that Dr. Holly Bodily changed her medication and she is unsure what this is in regards to. Please advise.    [phone]

## 2019-09-03 ENCOUNTER — Other Ambulatory Visit: Payer: Self-pay

## 2019-09-03 ENCOUNTER — Encounter: Payer: Self-pay | Admitting: Family Medicine

## 2019-09-03 ENCOUNTER — Ambulatory Visit: Payer: 59 | Admitting: Family Medicine

## 2019-09-03 VITALS — BP 152/84 | HR 92 | Temp 98.6°F | Resp 16 | Ht 63.5 in | Wt 199.0 lb

## 2019-09-03 DIAGNOSIS — E785 Hyperlipidemia, unspecified: Secondary | ICD-10-CM

## 2019-09-03 DIAGNOSIS — Z794 Long term (current) use of insulin: Secondary | ICD-10-CM

## 2019-09-03 DIAGNOSIS — E669 Obesity, unspecified: Secondary | ICD-10-CM

## 2019-09-03 DIAGNOSIS — E1165 Type 2 diabetes mellitus with hyperglycemia: Secondary | ICD-10-CM | POA: Diagnosis not present

## 2019-09-03 DIAGNOSIS — I10 Essential (primary) hypertension: Secondary | ICD-10-CM | POA: Diagnosis not present

## 2019-09-03 DIAGNOSIS — E559 Vitamin D deficiency, unspecified: Secondary | ICD-10-CM

## 2019-09-03 DIAGNOSIS — E66811 Obesity, class 1: Secondary | ICD-10-CM

## 2019-09-03 MED ORDER — METFORMIN HCL 500 MG PO TABS
500.0000 mg | ORAL_TABLET | Freq: Two times a day (BID) | ORAL | 3 refills | Status: DC
Start: 2019-09-03 — End: 2020-03-24

## 2019-09-03 MED ORDER — AMLODIPINE BESYLATE 10 MG PO TABS: 10.0000 mg | ORAL_TABLET | Freq: Every day | ORAL | 3 refills | Status: DC

## 2019-09-03 NOTE — Patient Instructions (Addendum)
F/U with mD re evaluate blood pressure in 5 weeks, call if you need me sooner  Congrats on improved blood sugar.  Please start taking Metformin 500 mg 1 twice daily and continue Lantus 40 units at bedtime.  Blood pressure is still high the dose of amlodipine is increased from 5 mg to 10 mg once daily.  You may take two  5 mg tablets together until they are finished and  collect your you higher dose as soon as possible.  You will be started on medication for cholesterol once I get the results of the tests that you do today.  We will be in touch with this information.  Labs today CBC lipid CMP and EGFR TSH vitamin D  It is important that you exercise regularly at least 30 minutes 5 times a week. If you develop chest pain, have severe difficulty breathing, or feel very tired, stop exercising immediately and seek medical attention  Think about what you will eat, plan ahead. Choose " clean, green, fresh or frozen" over canned, processed or packaged foods which are more sugary, salty and fatty. 70 to 75% of food eaten should be vegetables and fruit. Three meals at set times with snacks allowed between meals, but they must be fruit or vegetables. Aim to eat over a 12 hour period , example 7 am to 7 pm, and STOP after  your last meal of the day. Drink water,generally about 64 ounces per day, no other drink is as healthy. Fruit juice is best enjoyed in a healthy way, by EATING the fruit.   Thanks for choosing [facility], we consider it a privelige to serve you.

## 2019-09-04 LAB — CBC
Hematocrit: 39.4 % (ref 34.0–46.6)
Hemoglobin: 13 g/dL (ref 11.1–15.9)
MCH: 30.9 pg (ref 26.6–33.0)
MCHC: 33 g/dL (ref 31.5–35.7)
MCV: 94 fL (ref 79–97)
Platelets: 345 10*3/uL (ref 150–450)
RBC: 4.21 x10E6/uL (ref 3.77–5.28)
RDW: 12 % (ref 11.7–15.4)
WBC: 10.9 10*3/uL — ABNORMAL HIGH (ref 3.4–10.8)

## 2019-09-04 LAB — CMP14+EGFR
ALT: 16 IU/L (ref 0–32)
AST: 15 IU/L (ref 0–40)
Albumin/Globulin Ratio: 1.2 (ref 1.2–2.2)
Albumin: 4.2 g/dL (ref 3.8–4.9)
Alkaline Phosphatase: 81 IU/L (ref 48–121)
BUN/Creatinine Ratio: 17 (ref 9–23)
BUN: 16 mg/dL (ref 6–24)
Bilirubin Total: 0.4 mg/dL (ref 0.0–1.2)
CO2: 24 mmol/L (ref 20–29)
Calcium: 9.6 mg/dL (ref 8.7–10.2)
Chloride: 103 mmol/L (ref 96–106)
Creatinine, Ser: 0.95 mg/dL (ref 0.57–1.00)
GFR calc Af Amer: 77 mL/min/{1.73_m2} (ref >59)
GFR calc non Af Amer: 67 mL/min/{1.73_m2} (ref >59)
Globulin, Total: 3.6 g/dL (ref 1.5–4.5)
Glucose: 90 mg/dL (ref 65–99)
Potassium: 4.1 mmol/L (ref 3.5–5.2)
Sodium: 140 mmol/L (ref 134–144)
Total Protein: 7.8 g/dL (ref 6.0–8.5)

## 2019-09-04 LAB — VITAMIN D 25 HYDROXY (VIT D DEFICIENCY, FRACTURES): Vit D, 25-Hydroxy: 10.4 ng/mL — ABNORMAL LOW (ref 30.0–100.0)

## 2019-09-04 LAB — TSH: TSH: 0.576 u[IU]/mL (ref 0.450–4.500)

## 2019-09-05 ENCOUNTER — Encounter: Payer: Self-pay | Admitting: Family Medicine

## 2019-09-05 ENCOUNTER — Telehealth: Payer: Self-pay | Admitting: Family Medicine

## 2019-09-05 LAB — POCT GLYCOSYLATED HEMOGLOBIN (HGB A1C): Hemoglobin A1C: 7.7 % — AB (ref 4.0–5.6)

## 2019-09-05 NOTE — Addendum Note (Signed)
Addended by: Obie Dredge A on: 09/05/2019 10:17 AM   Modules accepted: Orders

## 2019-09-05 NOTE — Assessment & Plan Note (Signed)
Updated lab needed at/ before next visit. Will start statin dose to be determined Low fat diet discussed and encouraged

## 2019-09-05 NOTE — Telephone Encounter (Signed)
Please addend and  document the glycohemoglobin test done on this patient during her recent visit and enter  the order as an addendum also.   Please call patient and discuss recent lab results.  Let her know that her blood count kidney liver and thyroid function are all normal which is excellent.    Please let her know that when I get the result for her cholesterol  she will be notified and medication sent to treat this. Let her know may not be before Tuesday if not before office closes today  Thank you

## 2019-09-05 NOTE — Telephone Encounter (Signed)
Patient aware of results with verbal understanding.  

## 2019-09-05 NOTE — Assessment & Plan Note (Signed)
Uncontrolled, increase amlodipine to 10 mg daily  F/U I 4 to 5 weeks DASH diet and commitment to daily physical activity for a minimum of 30 minutes discussed and encouraged, as a part of hypertension management. The importance of attaining a healthy weight is also discussed.  BP/Weight 09/03/2019 06/04/2019 05/01/2019 01/29/2019 01/21/2019 09/30/2018 4/46/1901  Systolic BP 222 411 464 314 276 701 100  Diastolic BP 84 95 72 72 90 88 76  Wt. (Lbs) 199 195 188 188 193 194.12 199  BMI 34.7 34 32.78 32.78 33.65 33.85 34.7

## 2019-09-05 NOTE — Assessment & Plan Note (Signed)
Improved but still needs to  Be better. Pt to increase metformin to prescribed dose of twice daily and continue lantus as before Caitlin Thompson is reminded of the importance of commitment to daily physical activity for 30 minutes or more, as able and the need to limit carbohydrate intake to 30 to 60 grams per meal to help with blood sugar control.   The need to take medication as prescribed, test blood sugar as directed, and to call between visits if there is a concern that blood sugar is uncontrolled is also discussed.   Caitlin Thompson is reminded of the importance of daily foot exam, annual eye examination, and good blood sugar, blood pressure and cholesterol control.  Diabetic Labs Latest Ref Rng & Units 09/03/2019 02/03/2019 09/27/2018 05/22/2018 05/17/2018  HbA1c 4.8 - 5.6 % - 8.8(H) 8.3(H) - 8.0(H)  Microalbumin mg/dL - - 58.6 439.5(H) -  Micro/Creat Ratio <30 mcg/mg creat - - 310(H) 389(H) -  Chol 0 - 200 mg/dL - 227(H) 243(H) - 236(H)  HDL >40 mg/dL - 65 60 - 60  Calc LDL 0 - 99 mg/dL - 134(H) 161(H) - 151(H)  Triglycerides <150 mg/dL - 139 102 - 129  Creatinine 0.57 - 1.00 mg/dL 0.95 0.77 0.87 - 0.75   BP/Weight 09/03/2019 06/04/2019 05/01/2019 01/29/2019 01/21/2019 09/30/2018 5/97/4163  Systolic BP 845 364 680 321 224 825 003  Diastolic BP 84 95 72 72 90 88 76  Wt. (Lbs) 199 195 188 188 193 194.12 199  BMI 34.7 34 32.78 32.78 33.65 33.85 34.7   Foot/eye exam completion dates Latest Ref Rng & Units 05/30/2019 01/21/2019  Eye Exam No Retinopathy Retinopathy(A) -  Foot exam Order - - -  Foot Form Completion - - Done

## 2019-09-05 NOTE — Assessment & Plan Note (Signed)
  Patient re-educated about  the importance of commitment to a  minimum of 150 minutes of exercise per week as able.  The importance of healthy food choices with portion control discussed, as well as eating regularly and within a 12 hour window most days. The need to choose "clean , green" food 50 to 75% of the time is discussed, as well as to make water the primary drink and set a goal of 64 ounces water daily.    Weight /BMI 09/03/2019 06/04/2019 05/01/2019  WEIGHT 199 lb 195 lb 188 lb  HEIGHT 5' 3.5" 5' 3.5" 5' 3.5"  BMI 34.7 kg/m2 34 kg/m2 32.78 kg/m2

## 2019-09-05 NOTE — Progress Notes (Signed)
Caitlin Thompson     MRN: [id number]      DOB: 10-18-1960   HPI Caitlin Thompson is here for follow up and re-evaluation of chronic medical conditions, medication management and review of any available recent lab and radiology data.  Preventive health is updated, specifically  Cancer screening and Immunization.   Questions or concerns regarding consultations or procedures which the PT has had in the interim are  addressed. The PT denies any adverse reactions to current medications since the last visit. Dose not take metformin as prescribed, uses 40 units insulin most days   ROS Denies recent fever or chills. Denies sinus pressure, nasal congestion, ear pain or sore throat. Denies chest congestion, productive cough or wheezing. Denies chest pains, palpitations and leg swelling Denies abdominal pain, nausea, vomiting,diarrhea or constipation.   Denies dysuria, frequency, hesitancy or incontinence. Denies joint pain, swelling and limitation in mobility. Denies headaches, seizures, numbness, or tingling. Denies depression, anxiety or insomnia. Denies skin break down or rash.   PE  BP (!) 152/84   Pulse 92   Temp 98.6 F (37 C)   Resp 16   Ht 5' 3.5" (1.613 m)   Wt 199 lb (90.3 kg)   SpO2 97%   BMI 34.70 kg/m   Patient alert and oriented and in no cardiopulmonary distress.  HEENT: No facial asymmetry, EOMI,     Neck supple .  Chest: Clear to auscultation bilaterally.  CVS: S1, S2 no murmurs, no S3.Regular rate.  ABD: Soft non tender.   Ext: No edema  MS: Adequate ROM spine, shoulders, hips and knees.  Skin: Intact, no ulcerations or rash noted.  Psych: Good eye contact, normal affect. Memory intact not anxious or depressed appearing.  CNS: CN 2-12 intact, power,  normal throughout.no focal deficits noted.   Assessment & Plan  Essential hypertension Uncontrolled, increase amlodipine to 10 mg daily  F/U I 4 to 5 weeks DASH diet and commitment to daily physical  activity for a minimum of 30 minutes discussed and encouraged, as a part of hypertension management. The importance of attaining a healthy weight is also discussed.  BP/Weight 09/03/2019 06/04/2019 05/01/2019 01/29/2019 01/21/2019 09/30/2018 7/65/4650  Systolic BP 354 656 812 751 700 174 944  Diastolic BP 84 95 72 72 90 88 76  Wt. (Lbs) 199 195 188 188 193 194.12 199  BMI 34.7 34 32.78 32.78 33.65 33.85 34.7       Hyperlipidemia LDL goal <100 Updated lab needed at/ before next visit. Will start statin dose to be determined Low fat diet discussed and encouraged  Type 2 diabetes mellitus with hyperglycemia ([facility]) Improved but still needs to  Be better. Pt to increase metformin to prescribed dose of twice daily and continue lantus as before Caitlin Thompson is reminded of the importance of commitment to daily physical activity for 30 minutes or more, as able and the need to limit carbohydrate intake to 30 to 60 grams per meal to help with blood sugar control.   The need to take medication as prescribed, test blood sugar as directed, and to call between visits if there is a concern that blood sugar is uncontrolled is also discussed.   Caitlin Thompson is reminded of the importance of daily foot exam, annual eye examination, and good blood sugar, blood pressure and cholesterol control.  Diabetic Labs Latest Ref Rng & Units 09/03/2019 02/03/2019 09/27/2018 05/22/2018 05/17/2018  HbA1c 4.8 - 5.6 % - 8.8(H) 8.3(H) - 8.0(H)  Microalbumin mg/dL - -  58.6 439.5(H) -  Micro/Creat Ratio <30 mcg/mg creat - - 310(H) 389(H) -  Chol 0 - 200 mg/dL - 227(H) 243(H) - 236(H)  HDL >40 mg/dL - 65 60 - 60  Calc LDL 0 - 99 mg/dL - 134(H) 161(H) - 151(H)  Triglycerides <150 mg/dL - 139 102 - 129  Creatinine 0.57 - 1.00 mg/dL 0.95 0.77 0.87 - 0.75   BP/Weight 09/03/2019 06/04/2019 05/01/2019 01/29/2019 01/21/2019 09/30/2018 1/84/0375  Systolic BP 436 067 703 403 524 818 590  Diastolic BP 84 95 72 72 90 88 76  Wt. (Lbs) 199 195  188 188 193 194.12 199  BMI 34.7 34 32.78 32.78 33.65 33.85 34.7   Foot/eye exam completion dates Latest Ref Rng & Units 05/30/2019 01/21/2019  Eye Exam No Retinopathy Retinopathy(A) -  Foot exam Order - - -  Foot Form Completion - - Done        Obesity (BMI 30.0-34.9)  Patient re-educated about  the importance of commitment to a  minimum of 150 minutes of exercise per week as able.  The importance of healthy food choices with portion control discussed, as well as eating regularly and within a 12 hour window most days. The need to choose "clean , green" food 50 to 75% of the time is discussed, as well as to make water the primary drink and set a goal of 64 ounces water daily.    Weight /BMI 09/03/2019 06/04/2019 05/01/2019  WEIGHT 199 lb 195 lb 188 lb  HEIGHT 5' 3.5" 5' 3.5" 5' 3.5"  BMI 34.7 kg/m2 34 kg/m2 32.78 kg/m2

## 2019-09-09 ENCOUNTER — Other Ambulatory Visit: Payer: Self-pay

## 2019-09-09 ENCOUNTER — Telehealth: Payer: Self-pay | Admitting: Family Medicine

## 2019-09-09 DIAGNOSIS — I10 Essential (primary) hypertension: Secondary | ICD-10-CM

## 2019-09-09 DIAGNOSIS — E785 Hyperlipidemia, unspecified: Secondary | ICD-10-CM

## 2019-09-09 MED ORDER — ROSUVASTATIN CALCIUM 10 MG PO TABS: 10.0000 mg | ORAL_TABLET | Freq: Every day | ORAL | 1 refills | Status: DC

## 2019-09-09 NOTE — Telephone Encounter (Signed)
Please contact patient and let her know that I do have her cholesterol results.  Cholesterol is high at 236 the goal is less than 200.  Bad cholesterol is high at 151 goal for a diabetic is 99 or less.  As we discussed high cholesterol increases risk of heart disease which is already at high risk in a diabetic.  She is to start Crestor 10 mg 1 at bedtime this is prescribed for her.  Also please reduce fried and fatty foods.  Repeat fasting lipid hepatic panel and kidney function test is due in 4 months.

## 2019-09-09 NOTE — Telephone Encounter (Signed)
Patient aware of results.

## 2019-10-08 ENCOUNTER — Ambulatory Visit: Payer: 59 | Admitting: Family Medicine

## 2019-10-14 ENCOUNTER — Ambulatory Visit: Payer: 59 | Admitting: Family Medicine

## 2020-03-10 ENCOUNTER — Other Ambulatory Visit: Payer: Self-pay

## 2020-03-10 ENCOUNTER — Ambulatory Visit
Admission: EM | Admit: 2020-03-10 | Discharge: 2020-03-10 | Discharge status: Absent without leave | Payer: No Typology Code available for payment source

## 2020-03-24 ENCOUNTER — Encounter: Payer: Self-pay | Admitting: Family Medicine

## 2020-03-24 ENCOUNTER — Telehealth (INDEPENDENT_AMBULATORY_CARE_PROVIDER_SITE_OTHER): Payer: No Typology Code available for payment source | Admitting: Family Medicine

## 2020-03-24 ENCOUNTER — Other Ambulatory Visit: Payer: Self-pay

## 2020-03-24 VITALS — BP 135/88 | Ht 63.5 in | Wt 185.0 lb

## 2020-03-24 DIAGNOSIS — E785 Hyperlipidemia, unspecified: Secondary | ICD-10-CM

## 2020-03-24 DIAGNOSIS — E669 Obesity, unspecified: Secondary | ICD-10-CM

## 2020-03-24 DIAGNOSIS — I10 Essential (primary) hypertension: Secondary | ICD-10-CM | POA: Diagnosis not present

## 2020-03-24 DIAGNOSIS — Z794 Long term (current) use of insulin: Secondary | ICD-10-CM

## 2020-03-24 DIAGNOSIS — E1165 Type 2 diabetes mellitus with hyperglycemia: Secondary | ICD-10-CM

## 2020-03-24 DIAGNOSIS — Z1231 Encounter for screening mammogram for malignant neoplasm of breast: Secondary | ICD-10-CM

## 2020-03-24 DIAGNOSIS — E66811 Obesity, class 1: Secondary | ICD-10-CM

## 2020-03-24 DIAGNOSIS — D126 Benign neoplasm of colon, unspecified: Secondary | ICD-10-CM

## 2020-03-24 MED ORDER — ONETOUCH VERIO VI STRP: ORAL_STRIP | 1 refills | Status: DC

## 2020-03-24 MED ORDER — AMLODIPINE BESYLATE 10 MG PO TABS: 10.0000 mg | ORAL_TABLET | Freq: Every day | ORAL | 1 refills | Status: DC

## 2020-03-24 MED ORDER — METFORMIN HCL 500 MG PO TABS: 500.0000 mg | ORAL_TABLET | Freq: Two times a day (BID) | ORAL | 1 refills | Status: DC

## 2020-03-24 MED ORDER — ONETOUCH DELICA LANCETS 33G MISC: 1 refills | Status: AC

## 2020-03-24 MED ORDER — ROSUVASTATIN CALCIUM 10 MG PO TABS: 10.0000 mg | ORAL_TABLET | Freq: Every day | ORAL | 1 refills | Status: DC

## 2020-03-24 NOTE — Progress Notes (Signed)
Virtual Visit via Telephone Note  I connected with Caitlin Thompson on 03/24/20 at  3:20 PM EST by telephone and verified that I am speaking with the correct person using two identifiers.  Location: Patient: home Provider: work   I discussed the limitations, risks, security and privacy concerns of performing an evaluation and management service by telephone and the availability of in person appointments. I also discussed with the patient that there may be a patient responsible charge related to this service. The patient expressed understanding and agreed to proceed.   History of Present Illness: F/U chronic problems. Reports regiular exercise and controlled blood sugar when she checks it Denies polyuria, polydipsia, blurred vision , or hypoglycemic episodes. Needs medication prescribed Denies recent fever or chills. Denies sinus pressure, nasal congestion, ear pain or sore throat. Denies chest congestion, productive cough or wheezing. Denies chest pains, palpitations and leg swelling Denies abdominal pain, nausea, vomiting,diarrhea or constipation.   Denies dysuria, frequency, hesitancy or incontinence. Denies joint pain, swelling and limitation in mobility. Denies headaches, seizures, numbness, or tingling. Denies depression, anxiety or insomnia. Denies skin break down or rash.       Observations/Objective: BP 135/88   Ht 5' 3.5" (1.613 m)   Wt 185 lb (83.9 kg)   BMI 32.26 kg/m  Good communication with no confusion and intact memory. Alert and oriented x 3 No signs of respiratory distress during speech    Assessment and Plan:  Type 2 diabetes mellitus with hyperglycemia ([place]) Caitlin Thompson is reminded of the importance of commitment to daily physical activity for 30 minutes or more, as able and the need to limit carbohydrate intake to 30 to 60 grams per meal to help with blood sugar control.   The need to take medication as prescribed, test blood sugar as directed, and  to call between visits if there is a concern that blood sugar is uncontrolled is also discussed.   Caitlin Thompson is reminded of the importance of daily foot exam, annual eye examination, and good blood sugar, blood pressure and cholesterol control. Uncontrolled, needs to start at 35 units daily and titrate every 3 days, f/u in 5 weeks with updated info, call sooner if needed Diabetic Labs Latest Ref Rng & Units 03/25/2020 09/05/2019 09/03/2019 02/03/2019 09/27/2018  HbA1c 4.8 - 5.6 % 10.0(H) 7.7(A) - 8.8(H) 8.3(H)  Microalbumin mg/dL - - - - 58.6  Micro/Creat Ratio 0 - 29 mg/g creat 432(H) - - - 310(H)  Chol 100 - 199 mg/dL 262(H) - - 227(H) 243(H)  HDL >39 mg/dL 64 - - 65 60  Calc LDL 0 - 99 mg/dL 173(H) - - 134(H) 161(H)  Triglycerides 0 - 149 mg/dL 142 - - 139 102  Creatinine 0.57 - 1.00 mg/dL 0.81 - 0.95 0.77 0.87   BP/Weight 03/24/2020 09/03/2019 06/04/2019 05/01/2019 01/29/2019 01/21/2019 10/28/537  Systolic BP 767 341 937 902 409 735 329  Diastolic BP 88 84 95 72 72 90 88  Wt. (Lbs) 185 199 195 188 188 193 194.12  BMI 32.26 34.7 34 32.78 32.78 33.65 33.85   Foot/eye exam completion dates Latest Ref Rng & Units 05/30/2019 01/21/2019  Eye Exam No Retinopathy Retinopathy(A) -  Foot exam Order - - -  Foot Form Completion - - Done        Hyperlipidemia LDL goal <100 Hyperlipidemia:Low fat diet discussed and encouraged.   Lipid Panel  Lab Results  Component Value Date   CHOL 262 (H) 03/25/2020   HDL 64 03/25/2020  [place] 173 (H) 03/25/2020   TRIG 142 03/25/2020   CHOLHDL 4.1 03/25/2020     Uncontrolled, needs to resume crestor and follow low fat diet  Obesity (BMI 30.0-34.9)  Patient re-educated about  the importance of commitment to a  minimum of 150 minutes of exercise per week as able.  The importance of healthy food choices with portion control discussed, as well as eating regularly and within a 12 hour window most days. The need to choose "clean , green" food 50 to 75%  of the time is discussed, as well as to make water the primary drink and set a goal of 64 ounces water daily.    Weight /BMI 03/24/2020 09/03/2019 06/04/2019  WEIGHT 185 lb 199 lb 195 lb  HEIGHT 5' 3.5" 5' 3.5" 5' 3.5"  BMI 32.26 kg/m2 34.7 kg/m2 34 kg/m2      Essential hypertension Controlled, no change in medication DASH diet and commitment to daily physical activity for a minimum of 30 minutes discussed and encouraged, as a part of hypertension management. The importance of attaining a healthy weight is also discussed.  BP/Weight 03/24/2020 09/03/2019 06/04/2019 05/01/2019 01/29/2019 01/21/2019 1/61/0960  Systolic BP 454 098 119 147 829 562 130  Diastolic BP 88 84 95 72 72 90 88  Wt. (Lbs) 185 199 195 188 188 193 194.12  BMI 32.26 34.7 34 32.78 32.78 33.65 33.85         Follow Up Instructions:    I discussed the assessment and treatment plan with the patient. The patient was provided an opportunity to ask questions and all were answered. The patient agreed with the plan and demonstrated an understanding of the instructions.   The patient was advised to call back or seek an in-person evaluation if the symptoms worsen or if the condition fails to improve as anticipated.  I provided 22 minutes of non-face-to-face time during this encounter.   Tula Nakayama, MD

## 2020-03-24 NOTE — Patient Instructions (Addendum)
Physical with pap and foot exam in office with Md in  3.5  months, call if you need me sooner  Flu vaccine by nurse tomorrow morning   Please get fasting CBC, lipid, cmp and EGFr, hBa1C and microalb tomorrow morning   Lancets and test strips for 3 times daily testing for one touch meter to be sent in today  Mammogram to be scheduled at checkout  It is important that you exercise regularly at least 30 minutes 5 times a week. If you develop chest pain, have severe difficulty breathing, or feel very tired, stop exercising immediately and seek medical attention  Think about what you will eat, plan ahead. Choose " clean, green, fresh or frozen" over canned, processed or packaged foods which are more sugary, salty and fatty. 70 to 75% of food eaten should be vegetables and fruit. Three meals at set times with snacks allowed between meals, but they must be fruit or vegetables. Aim to eat over a 12 hour period , example 7 am to 7 pm, and STOP after  your last meal of the day. Drink water,generally about 64 ounces per day, no other drink is as healthy. Fruit juice is best enjoyed in a healthy way, by EATING the fruit. Thanks for choosing [facility], we consider it a privelige to serve you.

## 2020-03-25 ENCOUNTER — Other Ambulatory Visit: Payer: Self-pay

## 2020-03-25 ENCOUNTER — Ambulatory Visit (INDEPENDENT_AMBULATORY_CARE_PROVIDER_SITE_OTHER): Payer: No Typology Code available for payment source

## 2020-03-25 DIAGNOSIS — Z23 Encounter for immunization: Secondary | ICD-10-CM | POA: Diagnosis not present

## 2020-03-27 ENCOUNTER — Encounter: Payer: Self-pay | Admitting: Family Medicine

## 2020-03-27 LAB — CMP14+EGFR
ALT: 18 IU/L (ref 0–32)
AST: 15 IU/L (ref 0–40)
Albumin/Globulin Ratio: 1.1 — ABNORMAL LOW (ref 1.2–2.2)
Albumin: 4 g/dL (ref 3.8–4.9)
Alkaline Phosphatase: 83 IU/L (ref 44–121)
BUN/Creatinine Ratio: 16 (ref 9–23)
BUN: 13 mg/dL (ref 6–24)
Bilirubin Total: 0.3 mg/dL (ref 0.0–1.2)
CO2: 22 mmol/L (ref 20–29)
Calcium: 9.3 mg/dL (ref 8.7–10.2)
Chloride: 101 mmol/L (ref 96–106)
Creatinine, Ser: 0.81 mg/dL (ref 0.57–1.00)
GFR calc Af Amer: 93 mL/min/{1.73_m2} (ref >59)
GFR calc non Af Amer: 81 mL/min/{1.73_m2} (ref >59)
Globulin, Total: 3.6 g/dL (ref 1.5–4.5)
Glucose: 203 mg/dL — ABNORMAL HIGH (ref 65–99)
Potassium: 4 mmol/L (ref 3.5–5.2)
Sodium: 141 mmol/L (ref 134–144)
Total Protein: 7.6 g/dL (ref 6.0–8.5)

## 2020-03-27 LAB — HEMOGLOBIN A1C
Est. average glucose Bld gHb Est-mCnc: 240 mg/dL
Hgb A1c MFr Bld: 10 % — ABNORMAL HIGH (ref 4.8–5.6)

## 2020-03-27 LAB — CBC
Hematocrit: 37.6 % (ref 34.0–46.6)
Hemoglobin: 12.3 g/dL (ref 11.1–15.9)
MCH: 30.6 pg (ref 26.6–33.0)
MCHC: 32.7 g/dL (ref 31.5–35.7)
MCV: 94 fL (ref 79–97)
Platelets: 279 10*3/uL (ref 150–450)
RBC: 4.02 x10E6/uL (ref 3.77–5.28)
RDW: 11.4 % — ABNORMAL LOW (ref 11.7–15.4)
WBC: 9.2 10*3/uL (ref 3.4–10.8)

## 2020-03-27 LAB — LIPID PANEL
Chol/HDL Ratio: 4.1 ratio (ref 0.0–4.4)
Cholesterol, Total: 262 mg/dL — ABNORMAL HIGH (ref 100–199)
HDL: 64 mg/dL (ref >39)
LDL Chol Calc (NIH): 173 mg/dL — ABNORMAL HIGH (ref 0–99)
Triglycerides: 142 mg/dL (ref 0–149)
VLDL Cholesterol Cal: 25 mg/dL (ref 5–40)

## 2020-03-27 LAB — MICROALBUMIN / CREATININE URINE RATIO
Creatinine, Urine: 126.5 mg/dL
Microalb/Creat Ratio: 432 mg/g creat — ABNORMAL HIGH (ref 0–29)
Microalbumin, Urine: 546.7 ug/mL

## 2020-03-27 MED ORDER — INSULIN GLARGINE 100 UNITS/ML SOLOSTAR PEN: PEN_INJECTOR | SUBCUTANEOUS | 5 refills | Status: DC

## 2020-03-27 NOTE — Assessment & Plan Note (Signed)
  Patient re-educated about  the importance of commitment to a  minimum of 150 minutes of exercise per week as able.  The importance of healthy food choices with portion control discussed, as well as eating regularly and within a 12 hour window most days. The need to choose "clean , green" food 50 to 75% of the time is discussed, as well as to make water the primary drink and set a goal of 64 ounces water daily.    Weight /BMI 03/24/2020 09/03/2019 06/04/2019  WEIGHT 185 lb 199 lb 195 lb  HEIGHT 5' 3.5" 5' 3.5" 5' 3.5"  BMI 32.26 kg/m2 34.7 kg/m2 34 kg/m2

## 2020-03-27 NOTE — Assessment & Plan Note (Signed)
Controlled, no change in medication DASH diet and commitment to daily physical activity for a minimum of 30 minutes discussed and encouraged, as a part of hypertension management. The importance of attaining a healthy weight is also discussed.  BP/Weight 03/24/2020 09/03/2019 06/04/2019 05/01/2019 01/29/2019 01/21/2019 7/82/4235  Systolic BP 361 443 154 008 676 195 093  Diastolic BP 88 84 95 72 72 90 88  Wt. (Lbs) 185 199 195 188 188 193 194.12  BMI 32.26 34.7 34 32.78 32.78 33.65 33.85

## 2020-03-27 NOTE — Assessment & Plan Note (Signed)
Hyperlipidemia:Low fat diet discussed and encouraged.   Lipid Panel  Lab Results  Component Value Date   CHOL 262 (H) 03/25/2020   HDL 64 03/25/2020   LDLCALC 173 (H) 03/25/2020   TRIG 142 03/25/2020   CHOLHDL 4.1 03/25/2020     Uncontrolled, needs to resume crestor and follow low fat diet

## 2020-03-27 NOTE — Assessment & Plan Note (Signed)
Ms. Schnelle is reminded of the importance of commitment to daily physical activity for 30 minutes or more, as able and the need to limit carbohydrate intake to 30 to 60 grams per meal to help with blood sugar control.   The need to take medication as prescribed, test blood sugar as directed, and to call between visits if there is a concern that blood sugar is uncontrolled is also discussed.   Ms. Fallaw is reminded of the importance of daily foot exam, annual eye examination, and good blood sugar, blood pressure and cholesterol control. Uncontrolled, needs to start at 35 units daily and titrate every 3 days, f/u in 5 weeks with updated info, call sooner if needed Diabetic Labs Latest Ref Rng & Units 03/25/2020 09/05/2019 09/03/2019 02/03/2019 09/27/2018  HbA1c 4.8 - 5.6 % 10.0(H) 7.7(A) - 8.8(H) 8.3(H)  Microalbumin mg/dL - - - - 58.6  Micro/Creat Ratio 0 - 29 mg/g creat 432(H) - - - 310(H)  Chol 100 - 199 mg/dL 262(H) - - 227(H) 243(H)  HDL >39 mg/dL 64 - - 65 60  Calc LDL 0 - 99 mg/dL 173(H) - - 134(H) 161(H)  Triglycerides 0 - 149 mg/dL 142 - - 139 102  Creatinine 0.57 - 1.00 mg/dL 0.81 - 0.95 0.77 0.87   BP/Weight 03/24/2020 09/03/2019 06/04/2019 05/01/2019 01/29/2019 01/21/2019 09/28/3662  Systolic BP 403 474 259 563 875 643 329  Diastolic BP 88 84 95 72 72 90 88  Wt. (Lbs) 185 199 195 188 188 193 194.12  BMI 32.26 34.7 34 32.78 32.78 33.65 33.85   Foot/eye exam completion dates Latest Ref Rng & Units 05/30/2019 01/21/2019  Eye Exam No Retinopathy Retinopathy(A) -  Foot exam Order - - -  Foot Form Completion - - Done

## 2020-04-02 ENCOUNTER — Other Ambulatory Visit: Payer: Self-pay

## 2020-04-02 DIAGNOSIS — E1165 Type 2 diabetes mellitus with hyperglycemia: Secondary | ICD-10-CM

## 2020-04-02 MED ORDER — INSULIN GLARGINE 100 UNITS/ML SOLOSTAR PEN: PEN_INJECTOR | SUBCUTANEOUS | 5 refills | Status: DC

## 2020-07-20 ENCOUNTER — Ambulatory Visit: Payer: No Typology Code available for payment source | Admitting: Family Medicine

## 2020-07-22 ENCOUNTER — Ambulatory Visit: Payer: No Typology Code available for payment source | Admitting: Family Medicine

## 2020-09-27 ENCOUNTER — Encounter: Payer: Self-pay | Admitting: Family Medicine

## 2020-09-27 ENCOUNTER — Other Ambulatory Visit: Payer: Self-pay | Admitting: Family Medicine

## 2020-09-27 ENCOUNTER — Ambulatory Visit (INDEPENDENT_AMBULATORY_CARE_PROVIDER_SITE_OTHER): Payer: BC Managed Care – PPO | Admitting: Family Medicine

## 2020-09-27 ENCOUNTER — Other Ambulatory Visit: Payer: Self-pay

## 2020-09-27 VITALS — BP 182/107 | HR 88 | Temp 98.6°F | Resp 20 | Ht 64.0 in | Wt 202.0 lb

## 2020-09-27 DIAGNOSIS — I10 Essential (primary) hypertension: Secondary | ICD-10-CM

## 2020-09-27 DIAGNOSIS — N3001 Acute cystitis with hematuria: Secondary | ICD-10-CM | POA: Diagnosis not present

## 2020-09-27 DIAGNOSIS — E559 Vitamin D deficiency, unspecified: Secondary | ICD-10-CM | POA: Diagnosis not present

## 2020-09-27 DIAGNOSIS — Z794 Long term (current) use of insulin: Secondary | ICD-10-CM | POA: Diagnosis not present

## 2020-09-27 DIAGNOSIS — E785 Hyperlipidemia, unspecified: Secondary | ICD-10-CM

## 2020-09-27 DIAGNOSIS — E1165 Type 2 diabetes mellitus with hyperglycemia: Secondary | ICD-10-CM

## 2020-09-27 DIAGNOSIS — E669 Obesity, unspecified: Secondary | ICD-10-CM

## 2020-09-27 LAB — POCT URINALYSIS DIP (CLINITEK)
Bilirubin, UA: NEGATIVE
Glucose, UA: NEGATIVE mg/dL
Ketones, POC UA: NEGATIVE mg/dL
Nitrite, UA: POSITIVE — AB
POC PROTEIN,UA: 100 — AB
Spec Grav, UA: 1.015 (ref 1.010–1.025)
Urobilinogen, UA: 1 E.U./dL
pH, UA: 5.5 (ref 5.0–8.0)

## 2020-09-27 LAB — POCT GLYCOSYLATED HEMOGLOBIN (HGB A1C): HbA1c, POC (controlled diabetic range): 8.8 % — AB (ref 0.0–7.0)

## 2020-09-27 MED ORDER — AMLODIPINE BESYLATE 10 MG PO TABS: ORAL_TABLET | ORAL | 3 refills | Status: DC

## 2020-09-27 NOTE — Patient Instructions (Addendum)
Annual exam with pap in 5 weeks, re eval blood pressure  Labs today, lipid, cmp and EGFr, TSH and vit D  Amlodipine 10 mg twice daily for blood pressure  Blood sugar med will be adjusted  Please schedule mammograma at checkout appt after 4 if possible Current is 50 units insulin, will increase the dose   To 60 units  Thanks for choosing [facility], we consider it a privelige to serve you.

## 2020-09-28 LAB — CMP14+EGFR
ALT: 14 IU/L (ref 0–32)
AST: 20 IU/L (ref 0–40)
Albumin/Globulin Ratio: 1.3 (ref 1.2–2.2)
Albumin: 4.3 g/dL (ref 3.8–4.9)
Alkaline Phosphatase: 85 IU/L (ref 44–121)
BUN/Creatinine Ratio: 10 (ref 9–23)
BUN: 9 mg/dL (ref 6–24)
Bilirubin Total: 0.4 mg/dL (ref 0.0–1.2)
CO2: 25 mmol/L (ref 20–29)
Calcium: 9.4 mg/dL (ref 8.7–10.2)
Chloride: 103 mmol/L (ref 96–106)
Creatinine, Ser: 0.87 mg/dL (ref 0.57–1.00)
Globulin, Total: 3.4 g/dL (ref 1.5–4.5)
Glucose: 106 mg/dL — ABNORMAL HIGH (ref 65–99)
Potassium: 3.9 mmol/L (ref 3.5–5.2)
Sodium: 141 mmol/L (ref 134–144)
Total Protein: 7.7 g/dL (ref 6.0–8.5)
eGFR: 78 mL/min/{1.73_m2} (ref >59)

## 2020-09-28 LAB — LIPID PANEL
Chol/HDL Ratio: 3.7 ratio (ref 0.0–4.4)
Cholesterol, Total: 243 mg/dL — ABNORMAL HIGH (ref 100–199)
HDL: 66 mg/dL (ref >39)
LDL Chol Calc (NIH): 159 mg/dL — ABNORMAL HIGH (ref 0–99)
Triglycerides: 102 mg/dL (ref 0–149)
VLDL Cholesterol Cal: 18 mg/dL (ref 5–40)

## 2020-09-28 LAB — TSH: TSH: 0.432 u[IU]/mL — ABNORMAL LOW (ref 0.450–4.500)

## 2020-09-28 LAB — VITAMIN D 25 HYDROXY (VIT D DEFICIENCY, FRACTURES): Vit D, 25-Hydroxy: 9.4 ng/mL — ABNORMAL LOW (ref 30.0–100.0)

## 2020-09-28 MED ORDER — ROSUVASTATIN CALCIUM 10 MG PO TABS: 10.0000 mg | ORAL_TABLET | Freq: Every day | ORAL | 3 refills | Status: DC

## 2020-09-28 MED ORDER — ERGOCALCIFEROL 1.25 MG (50000 UT) PO CAPS: 50000.0000 [IU] | ORAL_CAPSULE | ORAL | 2 refills | Status: DC

## 2020-09-28 MED ORDER — METFORMIN HCL 500 MG PO TABS: 500.0000 mg | ORAL_TABLET | Freq: Two times a day (BID) | ORAL | 3 refills | Status: DC

## 2020-09-28 NOTE — Progress Notes (Signed)
Caitlin Thompson     MRN: [id number]      DOB: 03-Aug-1960   HPI [name] is here for follow up and re-evaluation of chronic medical conditions, medication management and review of any available recent lab and radiology data.  Preventive health is updated, specifically  Cancer screening and Immunization.   C/o dysuria and malodporous urine x 4 days, no fever, chills or flank pain Denies polyuria, polydipsia, blurred vision , or hypoglycemic episodes. Has not been taking medication consistently due to lack of insurance Plans to resume regular exercise for health and wellbeig  ROS Denies recent fever or chills. Denies sinus pressure, nasal congestion, ear pain or sore throat. Denies chest congestion, productive cough or wheezing. Denies chest pains, palpitations and leg swelling Denies abdominal pain, nausea, vomiting,diarrhea or constipation.    Denies joint pain, swelling and limitation in mobility. Denies headaches, seizures, numbness, or tingling. Denies depression, anxiety or insomnia. Denies skin break down or rash.   PE  BP (!) 182/107 (BP Location: Right Arm, Patient Position: Sitting, Cuff Size: Large)   Pulse 88   Temp 98.6 F (37 C)   Resp 20   Ht '5\' 4"'$  (1.626 m)   Wt 202 lb (91.6 kg)   SpO2 92%   BMI 34.67 kg/m   Patient alert and oriented and in  cardiopulmonary distress.  HEENT: No facial asymmetry, EOMI,     Neck supple .  Chest: Clear to auscultation bilaterally.  CVS: S1, S2 no murmurs, no S3.Regular rate.  ABD: Soft non tender.   Ext: No edema  MS: Adequate ROM spine, shoulders, hips and knees.  Skin: Intact, no ulcerations or rash noted.  Psych: Good eye contact, normal affect. Memory intact not anxious or depressed appearing.  CNS: CN 2-12 intact, power,  normal throughout.no focal deficits noted.   Assessment & Plan  Essential hypertension Uncontrolled due to not taking any medication.  Resume amlodipine 10 mg twice daily. DASH  diet and commitment to daily physical activity for a minimum of 30 minutes discussed and encouraged, as a part of hypertension management. The importance of attaining a healthy weight is also discussed.  BP/Weight 09/27/2020 03/24/2020 09/03/2019 06/04/2019 05/01/2019 01/29/2019 [id number]  Systolic BP Q000111Q [id number] 0000000 0000000 Q000111Q Q000111Q [id number]  Diastolic BP [id number] 88 84 95 72 72 90  Wt. (Lbs) 202 185 199 195 188 188 193  BMI 34.67 32.26 34.7 34 32.78 32.78 33.65       Hyperlipidemia LDL goal <100 Hyperlipidemia:Low fat diet discussed and encouraged.   Lipid Panel  Lab Results  Component Value Date   CHOL 243 (H) 09/27/2020   HDL 66 09/27/2020   LDLCALC 159 (H) 09/27/2020   TRIG 102 09/27/2020   CHOLHDL 3.7 09/27/2020     Start crestor 10 mg daily  Obesity (BMI 30.0-34.9)  Patient re-educated about  the importance of commitment to a  minimum of 150 minutes of exercise per week as able.  The importance of healthy food choices with portion control discussed, as well as eating regularly and within a 12 hour window most days. The need to choose "clean , green" food 50 to 75% of the time is discussed, as well as to make water the primary drink and set a goal of 64 ounces water daily.    Weight /BMI 09/27/2020 03/24/2020 09/03/2019  WEIGHT 202 lb 185 lb 199 lb  HEIGHT '5\' 4"'$  5' 3.5" 5' 3.5"  BMI 34.67 kg/m2 32.26 kg/m2 34.7 kg/m2  Type 2 diabetes mellitus with hyperglycemia [facility]) [name] is reminded of the importance of commitment to daily physical activity for 30 minutes or more, as able and the need to limit carbohydrate intake to 30 to 60 grams per meal to help with blood sugar control.   The need to take medication as prescribed, test blood sugar as directed, and to call between visits if there is a concern that blood sugar is uncontrolled is also discussed.   [name] is reminded of the importance of daily foot exam, annual eye examination, and good blood sugar, blood pressure and  cholesterol control.  Diabetic Labs Latest Ref Rng & Units 09/27/2020 03/25/2020 09/05/2019 09/03/2019 02/03/2019  HbA1c 0.0 - 7.0 % 8.8(A) 10.0(H) 7.7(A) - 8.8(H)  Microalbumin mg/dL - - - - -  Micro/Creat Ratio 0 - 29 mg/g creat - 432(H) - - -  Chol 100 - 199 mg/dL 243(H) 262(H) - - 227(H)  HDL >39 mg/dL 66 64 - - 65  Calc LDL 0 - 99 mg/dL 159(H) 173(H) - - 134(H)  Triglycerides 0 - 149 mg/dL 102 142 - - 139  Creatinine 0.57 - 1.00 mg/dL 0.87 0.81 - 0.95 0.77   BP/Weight 09/27/2020 03/24/2020 09/03/2019 06/04/2019 05/01/2019 01/29/2019 123456  Systolic BP Q000111Q A999333 0000000 0000000 Q000111Q Q000111Q XX123456  Diastolic BP [id number] 88 84 95 72 72 90  Wt. (Lbs) 202 185 199 195 188 188 193  BMI 34.67 32.26 34.7 34 32.78 32.78 33.65   Foot/eye exam completion dates Latest Ref Rng & Units 05/30/2019 01/21/2019  Eye Exam No Retinopathy Retinopathy(A) -  Foot exam Order - - -  Foot Form Completion - - Done    Uncontrolled increase insulin to 60 units daily and start metformin 500 mg twice daily    Acute cystitis with hematuria Symptomatic with abnormal UA.  5-day course of your antibiotic nitrofurantoin prescribed and will follow up on culture.

## 2020-09-29 ENCOUNTER — Encounter: Payer: Self-pay | Admitting: Family Medicine

## 2020-09-29 ENCOUNTER — Telehealth: Payer: Self-pay

## 2020-09-29 ENCOUNTER — Other Ambulatory Visit: Payer: Self-pay

## 2020-09-29 ENCOUNTER — Telehealth: Payer: No Typology Code available for payment source | Admitting: Family Medicine

## 2020-09-29 DIAGNOSIS — E1165 Type 2 diabetes mellitus with hyperglycemia: Secondary | ICD-10-CM

## 2020-09-29 DIAGNOSIS — Z794 Long term (current) use of insulin: Secondary | ICD-10-CM

## 2020-09-29 DIAGNOSIS — N3001 Acute cystitis with hematuria: Secondary | ICD-10-CM | POA: Insufficient documentation

## 2020-09-29 MED ORDER — INSULIN GLARGINE 100 UNITS/ML SOLOSTAR PEN: PEN_INJECTOR | SUBCUTANEOUS | 5 refills | Status: DC

## 2020-09-29 MED ORDER — ONETOUCH VERIO VI STRP: ORAL_STRIP | 1 refills | Status: DC

## 2020-09-29 MED ORDER — NITROFURANTOIN MONOHYD MACRO 100 MG PO CAPS: 100.0000 mg | ORAL_CAPSULE | Freq: Two times a day (BID) | ORAL | 0 refills | Status: DC

## 2020-09-29 NOTE — Assessment & Plan Note (Signed)
Hyperlipidemia:Low fat diet discussed and encouraged.   Lipid Panel  Lab Results  Component Value Date   CHOL 243 (H) 09/27/2020   HDL 66 09/27/2020   LDLCALC 159 (H) 09/27/2020   TRIG 102 09/27/2020   CHOLHDL 3.7 09/27/2020     Start crestor 10 mg daily

## 2020-09-29 NOTE — Assessment & Plan Note (Signed)
Caitlin Thompson is reminded of the importance of commitment to daily physical activity for 30 minutes or more, as able and the need to limit carbohydrate intake to 30 to 60 grams per meal to help with blood sugar control.   The need to take medication as prescribed, test blood sugar as directed, and to call between visits if there is a concern that blood sugar is uncontrolled is also discussed.   Caitlin Thompson is reminded of the importance of daily foot exam, annual eye examination, and good blood sugar, blood pressure and cholesterol control.  Diabetic Labs Latest Ref Rng & Units 09/27/2020 03/25/2020 09/05/2019 09/03/2019 02/03/2019  HbA1c 0.0 - 7.0 % 8.8(A) 10.0(H) 7.7(A) - 8.8(H)  Microalbumin mg/dL - - - - -  Micro/Creat Ratio 0 - 29 mg/g creat - 432(H) - - -  Chol 100 - 199 mg/dL 243(H) 262(H) - - 227(H)  HDL >39 mg/dL 66 64 - - 65  Calc LDL 0 - 99 mg/dL 159(H) 173(H) - - 134(H)  Triglycerides 0 - 149 mg/dL 102 142 - - 139  Creatinine 0.57 - 1.00 mg/dL 0.87 0.81 - 0.95 0.77   BP/Weight 09/27/2020 03/24/2020 09/03/2019 06/04/2019 05/01/2019 01/29/2019 123456  Systolic BP Q000111Q A999333 0000000 0000000 Q000111Q Q000111Q [id number]  Diastolic BP XX123456 88 84 95 72 72 90  Wt. (Lbs) 202 185 199 195 188 188 193  BMI 34.67 32.26 34.7 34 32.78 32.78 33.65   Foot/eye exam completion dates Latest Ref Rng & Units 05/30/2019 01/21/2019  Eye Exam No Retinopathy Retinopathy(A) -  Foot exam Order - - -  Foot Form Completion - - Done    Uncontrolled increase insulin to 60 units daily and start metformin 500 mg twice daily

## 2020-09-29 NOTE — Assessment & Plan Note (Signed)
Uncontrolled due to not taking any medication.  Resume amlodipine 10 mg twice daily. DASH diet and commitment to daily physical activity for a minimum of 30 minutes discussed and encouraged, as a part of hypertension management. The importance of attaining a healthy weight is also discussed.  BP/Weight 09/27/2020 03/24/2020 09/03/2019 06/04/2019 05/01/2019 01/29/2019 123456  Systolic BP Q000111Q A999333 0000000 0000000 Q000111Q Q000111Q [id number]  Diastolic BP XX123456 88 84 95 72 72 90  Wt. (Lbs) 202 185 199 195 188 188 193  BMI 34.67 32.26 34.7 34 32.78 32.78 33.65

## 2020-09-29 NOTE — Telephone Encounter (Signed)
Pt is calling, for insulin for Lantus is 400+,a nd she cant afford that -is there something else

## 2020-09-29 NOTE — Assessment & Plan Note (Signed)
Symptomatic with abnormal UA.  5-day course of your antibiotic nitrofurantoin prescribed and will follow up on culture.

## 2020-09-29 NOTE — Telephone Encounter (Signed)
[facility] is calling to verify dosage of amlodipine. You sent in amlodipine '10mg'$  twice daily and they said its usually not prescribed more than once per day. Please advise

## 2020-09-29 NOTE — Telephone Encounter (Signed)
Pt informed per pharmacy, they told her to contact insurance to find out preferred coverage for long acting insulin. Pt verbalizes understanding, will call [employer].

## 2020-09-29 NOTE — Telephone Encounter (Signed)
Caitlin Thompson from [facility] called needs to verify dosage of Amlodipine that was sent in.  Also patient said Dr Moshe Cipro was going to send in medicine for her bladder infection and insulin and test strips has not yet received.  [facility] pharmacy call back # [phone]

## 2020-09-29 NOTE — Assessment & Plan Note (Signed)
  Patient re-educated about  the importance of commitment to a  minimum of 150 minutes of exercise per week as able.  The importance of healthy food choices with portion control discussed, as well as eating regularly and within a 12 hour window most days. The need to choose "clean , green" food 50 to 75% of the time is discussed, as well as to make water the primary drink and set a goal of 64 ounces water daily.    Weight /BMI 09/27/2020 03/24/2020 09/03/2019  WEIGHT 202 lb 185 lb 199 lb  HEIGHT '5\' 4"'$  5' 3.5" 5' 3.5"  BMI 34.67 kg/m2 32.26 kg/m2 34.7 kg/m2

## 2020-09-30 NOTE — Telephone Encounter (Signed)
Pharmacy aware

## 2020-10-02 LAB — URINE CULTURE

## 2020-10-04 ENCOUNTER — Ambulatory Visit (HOSPITAL_COMMUNITY)
Admission: RE | Admit: 2020-10-04 | Discharge: 2020-10-04 | Disposition: A | Discharge status: Home / Self Care | Payer: BC Managed Care – PPO | Source: Ambulatory Visit | Attending: Family Medicine | Admitting: Family Medicine

## 2020-10-04 ENCOUNTER — Other Ambulatory Visit: Payer: Self-pay

## 2020-10-04 DIAGNOSIS — Z1231 Encounter for screening mammogram for malignant neoplasm of breast: Secondary | ICD-10-CM | POA: Diagnosis not present

## 2020-10-08 ENCOUNTER — Other Ambulatory Visit (HOSPITAL_COMMUNITY): Payer: Self-pay | Admitting: Family Medicine

## 2020-10-08 DIAGNOSIS — R928 Other abnormal and inconclusive findings on diagnostic imaging of breast: Secondary | ICD-10-CM

## 2020-10-26 ENCOUNTER — Ambulatory Visit (HOSPITAL_COMMUNITY)
Admission: RE | Admit: 2020-10-26 | Discharge: 2020-10-26 | Disposition: A | Discharge status: Home / Self Care | Payer: BC Managed Care – PPO | Source: Ambulatory Visit | Attending: Family Medicine | Admitting: Family Medicine

## 2020-10-26 ENCOUNTER — Other Ambulatory Visit: Payer: Self-pay

## 2020-10-26 DIAGNOSIS — R921 Mammographic calcification found on diagnostic imaging of breast: Secondary | ICD-10-CM | POA: Diagnosis not present

## 2020-10-26 DIAGNOSIS — R922 Inconclusive mammogram: Secondary | ICD-10-CM | POA: Diagnosis not present

## 2020-10-26 DIAGNOSIS — R928 Other abnormal and inconclusive findings on diagnostic imaging of breast: Secondary | ICD-10-CM

## 2020-11-05 ENCOUNTER — Encounter: Payer: Self-pay | Admitting: Family Medicine

## 2020-11-05 ENCOUNTER — Other Ambulatory Visit: Payer: Self-pay

## 2020-11-05 ENCOUNTER — Other Ambulatory Visit (HOSPITAL_COMMUNITY)
Admission: RE | Admit: 2020-11-05 | Discharge: 2020-11-05 | Disposition: A | Discharge status: Home / Self Care | Payer: BC Managed Care – PPO | Source: Ambulatory Visit | Attending: Family Medicine | Admitting: Family Medicine

## 2020-11-05 ENCOUNTER — Ambulatory Visit (INDEPENDENT_AMBULATORY_CARE_PROVIDER_SITE_OTHER): Payer: BC Managed Care – PPO | Admitting: Family Medicine

## 2020-11-05 VITALS — BP 155/83 | HR 99 | Temp 98.7°F | Resp 20 | Ht 64.0 in | Wt 201.0 lb

## 2020-11-05 DIAGNOSIS — Z124 Encounter for screening for malignant neoplasm of cervix: Secondary | ICD-10-CM | POA: Insufficient documentation

## 2020-11-05 DIAGNOSIS — Z794 Long term (current) use of insulin: Secondary | ICD-10-CM

## 2020-11-05 DIAGNOSIS — Z23 Encounter for immunization: Secondary | ICD-10-CM | POA: Diagnosis not present

## 2020-11-05 DIAGNOSIS — E1165 Type 2 diabetes mellitus with hyperglycemia: Secondary | ICD-10-CM

## 2020-11-05 DIAGNOSIS — Z0000 Encounter for general adult medical examination without abnormal findings: Secondary | ICD-10-CM

## 2020-11-05 MED ORDER — AMLODIPINE BESYLATE 10 MG PO TABS: 10.0000 mg | ORAL_TABLET | Freq: Every day | ORAL | 3 refills | Status: DC

## 2020-11-05 MED ORDER — METFORMIN HCL 500 MG PO TABS: 500.0000 mg | ORAL_TABLET | Freq: Two times a day (BID) | ORAL | 3 refills | Status: DC

## 2020-11-05 NOTE — Patient Instructions (Addendum)
F/U Oct 28, call if you need me sooner  Non fasting chem 7 and eGFr and hBA1C Oct 26 please  New insulin is prescribed which is a mix and is OTC, start once daily with 30 units and take metformin also  Amlodipine is prescribed once daily  Flu vaccine today  Thanks for choosing [facility], we consider it a privelige to serve you.

## 2020-11-07 ENCOUNTER — Encounter: Payer: Self-pay | Admitting: Family Medicine

## 2020-11-07 NOTE — Progress Notes (Signed)
Caitlin Thompson     MRN: [id number]      DOB: 09-Feb-1961  HPI: Patient is in for annual physical exam. No other health concerns are expressed or addressed at the visit. Recent labs,  are reviewed. Immunization is reviewed , and  updated if needed.   PE: BP (!) 155/83 (BP Location: Right Arm, Patient Position: Sitting, Cuff Size: Large)   Pulse 99   Temp 98.7 F (37.1 C)   Resp 20   Ht '5\' 4"'$  (1.626 m)   Wt 201 lb (91.2 kg)   SpO2 95%   BMI 34.50 kg/m   Pleasant  female, alert and oriented x 3, in no cardio-pulmonary distress. Afebrile. HEENT No facial trauma or asymetry. Sinuses non tender.  Extra occullar muscles intact.. External ears normal, . Neck: supple, no adenopathy,JVD or thyromegaly.No bruits.  Chest: Clear to ascultation bilaterally.No crackles or wheezes. Non tender to palpation  Breast: No asymetry,no masses or lumps. No tenderness. No nipple discharge or inversion. No axillary or supraclavicular adenopathy  Cardiovascular system; Heart sounds normal,  S1 and  S2 ,no S3.  No murmur, or thrill. Apical beat not displaced Peripheral pulses normal.  Abdomen: Soft, non tender, no organomegaly or masses. No bruits. Bowel sounds normal. No guarding, tenderness or rebound.   GU: External genitalia normal female genitalia , normal female distribution of hair. No lesions. Urethral meatus normal in size, no  Prolapse, no lesions visibly  Present. Bladder non tender. Vagina pink and moist , with no visible lesions , discharge present . Adequate pelvic support no  cystocele or rectocele noted Cervix pink and appears healthy, no lesions or ulcerations noted, no discharge noted from os Uterus normal size, no adnexal masses, no cervical motion or adnexal tenderness.   Musculoskeletal exam: Full ROM of spine, hips , shoulders and knees. No deformity ,swelling or crepitus noted. No muscle wasting or atrophy.   Neurologic: Cranial nerves 2 to 12  intact. Power, tone ,sensation and reflexes normal throughout. No disturbance in gait. No tremor.  Skin: Intact, no ulceration, erythema , scaling or rash noted. Pigmentation normal throughout  Psych; Normal mood and affect. Judgement and concentration normal   Assessment & Plan:  Annual physical exam Annual exam as documented. Counseling done  re healthy lifestyle involving commitment to 150 minutes exercise per week, heart healthy diet, and attaining healthy weight.The importance of adequate sleep also discussed. Regular seat belt use and home safety, is also discussed. Changes in health habits are decided on by the patient with goals and time frames  set for achieving them. Immunization and cancer screening needs are specifically addressed at this visit.   Type 2 diabetes mellitus with hyperglycemia [facility]) Caitlin Thompson is reminded of the importance of commitment to daily physical activity for 30 minutes or more, as able and the need to limit carbohydrate intake to 30 to 60 grams per meal to help with blood sugar control.   The need to take medication as prescribed, test blood sugar as directed, and to call between visits if there is a concern that blood sugar is uncontrolled is also discussed.   Caitlin Thompson is reminded of the importance of daily foot exam, annual eye examination, and good blood sugar, blood pressure and cholesterol control.  Diabetic Labs Latest Ref Rng & Units 09/27/2020 03/25/2020 09/05/2019 09/03/2019 02/03/2019  HbA1c 0.0 - 7.0 % 8.8(A) 10.0(H) 7.7(A) - 8.8(H)  Microalbumin mg/dL - - - - -  Micro/Creat Ratio 0 - 29 mg/g  creat - 432(H) - - -  Chol 100 - 199 mg/dL 243(H) 262(H) - - 227(H)  HDL >39 mg/dL 66 64 - - 65  Calc LDL 0 - 99 mg/dL 159(H) 173(H) - - 134(H)  Triglycerides 0 - 149 mg/dL 102 142 - - 139  Creatinine 0.57 - 1.00 mg/dL 0.87 0.81 - 0.95 0.77   BP/Weight 11/05/2020 09/27/2020 03/24/2020 09/03/2019 06/04/2019 05/01/2019 Q000111Q  Systolic BP 99991111 Q000111Q A999333  0000000 0000000 Q000111Q Q000111Q  Diastolic BP 83 [id number] 88 84 95 72 72  Wt. (Lbs) 201 202 185 199 195 188 188  BMI 34.5 34.67 32.26 34.7 34 32.78 32.78   Foot/eye exam completion dates Latest Ref Rng & Units 05/30/2019 01/21/2019  Eye Exam No Retinopathy Retinopathy(A) -  Foot exam Order - - -  Foot Form Completion - - Done   Updated lab needed at/ before next visit.

## 2020-11-07 NOTE — Assessment & Plan Note (Signed)
Caitlin Thompson is reminded of the importance of commitment to daily physical activity for 30 minutes or more, as able and the need to limit carbohydrate intake to 30 to 60 grams per meal to help with blood sugar control.   The need to take medication as prescribed, test blood sugar as directed, and to call between visits if there is a concern that blood sugar is uncontrolled is also discussed.   Caitlin Thompson is reminded of the importance of daily foot exam, annual eye examination, and good blood sugar, blood pressure and cholesterol control.  Diabetic Labs Latest Ref Rng & Units 09/27/2020 03/25/2020 09/05/2019 09/03/2019 02/03/2019  HbA1c 0.0 - 7.0 % 8.8(A) 10.0(H) 7.7(A) - 8.8(H)  Microalbumin mg/dL - - - - -  Micro/Creat Ratio 0 - 29 mg/g creat - 432(H) - - -  Chol 100 - 199 mg/dL 243(H) 262(H) - - 227(H)  HDL >39 mg/dL 66 64 - - 65  Calc LDL 0 - 99 mg/dL 159(H) 173(H) - - 134(H)  Triglycerides 0 - 149 mg/dL 102 142 - - 139  Creatinine 0.57 - 1.00 mg/dL 0.87 0.81 - 0.95 0.77   BP/Weight 11/05/2020 09/27/2020 03/24/2020 09/03/2019 06/04/2019 05/01/2019 Q000111Q  Systolic BP 99991111 Q000111Q A999333 0000000 0000000 Q000111Q Q000111Q  Diastolic BP 83 [id number] 88 84 95 72 72  Wt. (Lbs) 201 202 185 199 195 188 188  BMI 34.5 34.67 32.26 34.7 34 32.78 32.78   Foot/eye exam completion dates Latest Ref Rng & Units 05/30/2019 01/21/2019  Eye Exam No Retinopathy Retinopathy(A) -  Foot exam Order - - -  Foot Form Completion - - Done   Updated lab needed at/ before next visit.

## 2020-11-07 NOTE — Assessment & Plan Note (Signed)

## 2020-11-09 ENCOUNTER — Encounter (HOSPITAL_COMMUNITY): Payer: No Typology Code available for payment source

## 2020-11-09 ENCOUNTER — Other Ambulatory Visit (HOSPITAL_COMMUNITY): Payer: No Typology Code available for payment source

## 2020-11-12 LAB — CYTOLOGY - PAP
Comment: NEGATIVE
Diagnosis: NEGATIVE
High risk HPV: NEGATIVE

## 2020-12-31 ENCOUNTER — Encounter: Payer: Self-pay | Admitting: Family Medicine

## 2020-12-31 ENCOUNTER — Ambulatory Visit (INDEPENDENT_AMBULATORY_CARE_PROVIDER_SITE_OTHER): Payer: BC Managed Care – PPO | Admitting: Family Medicine

## 2020-12-31 ENCOUNTER — Other Ambulatory Visit: Payer: Self-pay

## 2020-12-31 VITALS — BP 156/83 | HR 104 | Resp 16 | Ht 64.0 in | Wt 202.0 lb

## 2020-12-31 DIAGNOSIS — E669 Obesity, unspecified: Secondary | ICD-10-CM | POA: Diagnosis not present

## 2020-12-31 DIAGNOSIS — E1165 Type 2 diabetes mellitus with hyperglycemia: Secondary | ICD-10-CM | POA: Diagnosis not present

## 2020-12-31 DIAGNOSIS — Z794 Long term (current) use of insulin: Secondary | ICD-10-CM

## 2020-12-31 DIAGNOSIS — I10 Essential (primary) hypertension: Secondary | ICD-10-CM

## 2020-12-31 DIAGNOSIS — E785 Hyperlipidemia, unspecified: Secondary | ICD-10-CM | POA: Diagnosis not present

## 2020-12-31 MED ORDER — AMLODIPINE BESYLATE 2.5 MG PO TABS: 2.5000 mg | ORAL_TABLET | Freq: Every day | ORAL | 5 refills | Status: DC

## 2020-12-31 NOTE — Patient Instructions (Signed)
F/u in first week in Feb, call if you need me sooner  Labs today  ( already ordered)  New extra medication for blood pressure is amlodipine 2.5 mg one daily  Total Blood pressure medication is amlodipine 10 mg one daily ALSO amlodipine 2.5 mg one daily, total of 12.5 mg daily  Please start the medication for cholesterol, rosuvastatin  It is important that you exercise regularly at least 30 minutes 5 times a week. If you develop chest pain, have severe difficulty breathing, or feel very tired, stop exercising immediately and seek medical attention   Think about what you will eat, plan ahead. Choose " clean, green, fresh or frozen" over canned, processed or packaged foods which are more sugary, salty and fatty. 70 to 75% of food eaten should be vegetables and fruit. Three meals at set times with snacks allowed between meals, but they must be fruit or vegetables. Aim to eat over a 12 hour period , example 7 am to 7 pm, and STOP after  your last meal of the day. Drink water,generally about 64 ounces per day, no other drink is as healthy. Fruit juice is best enjoyed in a healthy way, by EATING the fruit. Thanks for choosing [facility], we consider it a privelige to serve you.

## 2021-01-01 LAB — BMP8+EGFR
BUN/Creatinine Ratio: 20 (ref 9–23)
BUN: 19 mg/dL (ref 6–24)
CO2: 21 mmol/L (ref 20–29)
Calcium: 10 mg/dL (ref 8.7–10.2)
Chloride: 101 mmol/L (ref 96–106)
Creatinine, Ser: 0.97 mg/dL (ref 0.57–1.00)
Glucose: 84 mg/dL (ref 70–99)
Potassium: 4.1 mmol/L (ref 3.5–5.2)
Sodium: 141 mmol/L (ref 134–144)
eGFR: 68 mL/min/{1.73_m2}

## 2021-01-01 LAB — HEMOGLOBIN A1C
Est. average glucose Bld gHb Est-mCnc: 200 mg/dL
Hgb A1c MFr Bld: 8.6 % — ABNORMAL HIGH (ref 4.8–5.6)

## 2021-01-02 ENCOUNTER — Encounter: Payer: Self-pay | Admitting: Family Medicine

## 2021-01-02 NOTE — Assessment & Plan Note (Signed)
  Patient re-educated about  the importance of commitment to a  minimum of 150 minutes of exercise per week as able.  The importance of healthy food choices with portion control discussed, as well as eating regularly and within a 12 hour window most days. The need to choose "clean , green" food 50 to 75% of the time is discussed, as well as to make water the primary drink and set a goal of 64 ounces water daily.    Weight /BMI 12/31/2020 11/05/2020 09/27/2020  WEIGHT 202 lb 201 lb 202 lb  HEIGHT 5\' 4"  5\' 4"  5\' 4"   BMI 34.67 kg/m2 34.5 kg/m2 34.67 kg/m2

## 2021-01-02 NOTE — Progress Notes (Signed)
Caitlin Thompson     MRN: [id number]      DOB: [birth year]   HPI [name] is here for follow up and re-evaluation of chronic medical conditions, medication management and review of any available recent lab and radiology data.  Preventive health is updated, specifically  Cancer screening and Immunization.   Questions or concerns regarding consultations or procedures which the PT has had in the interim are  addressed. The PT denies any adverse reactions to current medications since the last visit.  There are no new concerns.  There are no specific complaints  Denies polyuria, polydipsia, blurred vision , or hypoglycemic episodes.   ROS Denies recent fever or chills. Denies sinus pressure, nasal congestion, ear pain or sore throat. Denies chest congestion, productive cough or wheezing. Denies chest pains, palpitations and leg swelling Denies abdominal pain, nausea, vomiting,diarrhea or constipation.   Denies dysuria, frequency, hesitancy or incontinence. Denies joint pain, swelling and limitation in mobility. Denies headaches, seizures, numbness, or tingling. Denies depression, anxiety or insomnia. Denies skin break down or rash.   PE  BP (!) 156/83   Pulse (!) 104   Resp 16   Ht 5\' 4"  (1.626 m)   Wt 202 lb (91.6 kg)   SpO2 96%   BMI 34.67 kg/m   Patient alert and oriented and in no cardiopulmonary distress.  HEENT: No facial asymmetry, EOMI,     Neck supple .  Chest: Clear to auscultation bilaterally.  CVS: S1, S2 no murmurs, no S3.Regular rate.  ABD: Soft non tender.   Ext: No edema  MS: Adequate ROM spine, shoulders, hips and knees.  Skin: Intact, no ulcerations or rash noted.  Psych: Good eye contact, normal affect. Memory intact not anxious or depressed appearing.  CNS: CN 2-12 intact, power,  normal throughout.no focal deficits noted.   Assessment & Plan  Essential hypertension Uncontrolled , add amlodipine 2.5 mg  DASH diet and commitment to  daily physical activity for a minimum of 30 minutes discussed and encouraged, as a part of hypertension management. The importance of attaining a healthy weight is also discussed.  BP/Weight 12/31/2020 11/05/2020 09/27/2020 03/24/2020 09/03/2019 06/04/2019 7/51/0258  Systolic BP 527 782 423 536 144 315 400  Diastolic BP 83 83 867 88 84 95 72  Wt. (Lbs) 202 201 202 185 199 195 188  BMI 34.67 34.5 34.67 32.26 34.7 34 32.78       Type 2 diabetes mellitus with hyperglycemia ([place]) [name] is reminded of the importance of commitment to daily physical activity for 30 minutes or more, as able and the need to limit carbohydrate intake to 30 to 60 grams per meal to help with blood sugar control.   The need to take medication as prescribed, test blood sugar as directed, and to call between visits if there is a concern that blood sugar is uncontrolled is also discussed.   Ms. Buckle is reminded of the importance of daily foot exam, annual eye examination, and good blood sugar, blood pressure and cholesterol control. Slightly improved, needs to increase metformin and insulin doses  Diabetic Labs Latest Ref Rng & Units 12/31/2020 09/27/2020 03/25/2020 09/05/2019 09/03/2019  HbA1c 4.8 - 5.6 % 8.6(H) 8.8(A) 10.0(H) 7.7(A) -  Microalbumin mg/dL - - - - -  Micro/Creat Ratio 0 - 29 mg/g creat - - 432(H) - -  Chol 100 - 199 mg/dL - 243(H) 262(H) - -  HDL >39 mg/dL - 66 64 - -  Calc LDL 0 -  99 mg/dL - 159(H) 173(H) - -  Triglycerides 0 - 149 mg/dL - 102 142 - -  Creatinine 0.57 - 1.00 mg/dL 0.97 0.87 0.81 - 0.95   BP/Weight 12/31/2020 11/05/2020 09/27/2020 03/24/2020 09/03/2019 06/04/2019 3/74/8270  Systolic BP 786 754 492 010 071 219 758  Diastolic BP 83 83 832 88 84 95 72  Wt. (Lbs) 202 201 202 185 199 195 188  BMI 34.67 34.5 34.67 32.26 34.7 34 32.78   Foot/eye exam completion dates Latest Ref Rng & Units 05/30/2019 01/21/2019  Eye Exam No Retinopathy Retinopathy(A) -  Foot exam Order - - -  Foot Form  Completion - - Done        Hyperlipidemia LDL goal <100 Hyperlipidemia:Low fat diet discussed and encouraged. Needs to take medication prescribed  Lipid Panel  Lab Results  Component Value Date   CHOL 243 (H) 09/27/2020   HDL 66 09/27/2020   LDLCALC 159 (H) 09/27/2020   TRIG 102 09/27/2020   CHOLHDL 3.7 09/27/2020       Obesity (BMI 30.0-34.9)  Patient re-educated about  the importance of commitment to a  minimum of 150 minutes of exercise per week as able.  The importance of healthy food choices with portion control discussed, as well as eating regularly and within a 12 hour window most days. The need to choose "clean , green" food 50 to 75% of the time is discussed, as well as to make water the primary drink and set a goal of 64 ounces water daily.    Weight /BMI 12/31/2020 11/05/2020 09/27/2020  WEIGHT 202 lb 201 lb 202 lb  HEIGHT 5\' 4"  5\' 4"  5\' 4"   BMI 34.67 kg/m2 34.5 kg/m2 34.67 kg/m2

## 2021-01-02 NOTE — Assessment & Plan Note (Signed)
Hyperlipidemia:Low fat diet discussed and encouraged. Needs to take medication prescribed  Lipid Panel  Lab Results  Component Value Date   CHOL 243 (H) 09/27/2020   HDL 66 09/27/2020   LDLCALC 159 (H) 09/27/2020   TRIG 102 09/27/2020   CHOLHDL 3.7 09/27/2020

## 2021-01-02 NOTE — Assessment & Plan Note (Signed)
Uncontrolled , add amlodipine 2.5 mg  DASH diet and commitment to daily physical activity for a minimum of 30 minutes discussed and encouraged, as a part of hypertension management. The importance of attaining a healthy weight is also discussed.  BP/Weight 12/31/2020 11/05/2020 09/27/2020 03/24/2020 09/03/2019 06/04/2019 9/78/4784  Systolic BP 128 208 138 871 959 747 185  Diastolic BP 83 83 501 88 84 95 72  Wt. (Lbs) 202 201 202 185 199 195 188  BMI 34.67 34.5 34.67 32.26 34.7 34 32.78

## 2021-01-02 NOTE — Assessment & Plan Note (Signed)
Ms. Couts is reminded of the importance of commitment to daily physical activity for 30 minutes or more, as able and the need to limit carbohydrate intake to 30 to 60 grams per meal to help with blood sugar control.   The need to take medication as prescribed, test blood sugar as directed, and to call between visits if there is a concern that blood sugar is uncontrolled is also discussed.   Ms. Ramires is reminded of the importance of daily foot exam, annual eye examination, and good blood sugar, blood pressure and cholesterol control. Slightly improved, needs to increase metformin and insulin doses  Diabetic Labs Latest Ref Rng & Units 12/31/2020 09/27/2020 03/25/2020 09/05/2019 09/03/2019  HbA1c 4.8 - 5.6 % 8.6(H) 8.8(A) 10.0(H) 7.7(A) -  Microalbumin mg/dL - - - - -  Micro/Creat Ratio 0 - 29 mg/g creat - - 432(H) - -  Chol 100 - 199 mg/dL - 243(H) 262(H) - -  HDL >39 mg/dL - 66 64 - -  Calc LDL 0 - 99 mg/dL - 159(H) 173(H) - -  Triglycerides 0 - 149 mg/dL - 102 142 - -  Creatinine 0.57 - 1.00 mg/dL 0.97 0.87 0.81 - 0.95   BP/Weight 12/31/2020 11/05/2020 09/27/2020 03/24/2020 09/03/2019 06/04/2019 5/63/1497  Systolic BP 026 378 588 502 774 128 786  Diastolic BP 83 83 767 88 84 95 72  Wt. (Lbs) 202 201 202 185 199 195 188  BMI 34.67 34.5 34.67 32.26 34.7 34 32.78   Foot/eye exam completion dates Latest Ref Rng & Units 05/30/2019 01/21/2019  Eye Exam No Retinopathy Retinopathy(A) -  Foot exam Order - - -  Foot Form Completion - - Done

## 2021-03-18 ENCOUNTER — Other Ambulatory Visit: Payer: Self-pay | Admitting: Family Medicine

## 2021-04-06 ENCOUNTER — Ambulatory Visit: Payer: BC Managed Care – PPO | Admitting: Family Medicine

## 2021-04-29 ENCOUNTER — Ambulatory Visit (INDEPENDENT_AMBULATORY_CARE_PROVIDER_SITE_OTHER): Payer: BC Managed Care – PPO | Admitting: Family Medicine

## 2021-04-29 ENCOUNTER — Other Ambulatory Visit: Payer: Self-pay

## 2021-04-29 ENCOUNTER — Encounter: Payer: Self-pay | Admitting: Family Medicine

## 2021-04-29 VITALS — BP 150/90 | HR 91 | Resp 16 | Ht 64.0 in | Wt 202.4 lb

## 2021-04-29 DIAGNOSIS — Z794 Long term (current) use of insulin: Secondary | ICD-10-CM

## 2021-04-29 DIAGNOSIS — I10 Essential (primary) hypertension: Secondary | ICD-10-CM

## 2021-04-29 DIAGNOSIS — E669 Obesity, unspecified: Secondary | ICD-10-CM | POA: Diagnosis not present

## 2021-04-29 DIAGNOSIS — E785 Hyperlipidemia, unspecified: Secondary | ICD-10-CM

## 2021-04-29 DIAGNOSIS — E1165 Type 2 diabetes mellitus with hyperglycemia: Secondary | ICD-10-CM

## 2021-04-29 DIAGNOSIS — E66811 Obesity, class 1: Secondary | ICD-10-CM

## 2021-04-29 LAB — POCT GLYCOSYLATED HEMOGLOBIN (HGB A1C): HbA1c, POC (controlled diabetic range): 9.1 % — AB (ref 0.0–7.0)

## 2021-04-29 MED ORDER — FREESTYLE LIBRE 2 READER DEVI: 0 refills | Status: DC

## 2021-04-29 MED ORDER — SPIRONOLACTONE 25 MG PO TABS: 25.0000 mg | ORAL_TABLET | Freq: Every day | ORAL | 3 refills | Status: DC

## 2021-04-29 MED ORDER — FREESTYLE LIBRE 2 SENSOR MISC: 5 refills | Status: DC

## 2021-04-29 NOTE — Patient Instructions (Addendum)
F/U in 6 weeks, call if you need me sooner  New additional medication for blood pressure is spironolactone 25 mg daily  STOP amlodipine 2.5 mg daily  CONTINUE amlodipine 10 mg daily  Increase once daily insulin to 55 units   After 2 weeks if blood sugar is still uincontrolled , increase to 60 units daily  We are trying to get a new testing system for you so that you do not have to prick your finger and you will get alerts for blood sugar highs and lows, nurse  will be in touch next week   Check fasting blood sugar, goal range is 80 to 130  Check bedtime blood sugar , goal range is 130 to 170  Microalb past due , please send today if able  CBc, fasting lipid, cmp and EGFR  5 to 7 days before next appointment  Thanks for choosing [facility], we consider it a privelige to serve you.

## 2021-05-01 ENCOUNTER — Encounter: Payer: Self-pay | Admitting: Family Medicine

## 2021-05-01 LAB — MICROALBUMIN / CREATININE URINE RATIO
Creatinine, Urine: 142.2 mg/dL
Microalb/Creat Ratio: 415 mg/g creat — ABNORMAL HIGH (ref 0–29)
Microalbumin, Urine: 590.7 ug/mL

## 2021-05-01 NOTE — Assessment & Plan Note (Signed)
°  Patient re-educated about  the importance of commitment to a  minimum of 150 minutes of exercise per week as able.  The importance of healthy food choices with portion control discussed, as well as eating regularly and within a 12 hour window most days. The need to choose "clean , green" food 50 to 75% of the time is discussed, as well as to make water the primary drink and set a goal of 64 ounces water daily.    Weight /BMI 04/29/2021 12/31/2020 11/05/2020  WEIGHT 202 lb 6.4 oz 202 lb 201 lb  HEIGHT 5\' 4"  5\' 4"  5\' 4"   BMI 34.74 kg/m2 34.67 kg/m2 34.5 kg/m2  unchnaged

## 2021-05-01 NOTE — Assessment & Plan Note (Signed)
Hyperlipidemia:Low fat diet discussed and encouraged.   Lipid Panel  Lab Results  Component Value Date   CHOL 243 (H) 09/27/2020   HDL 66 09/27/2020   LDLCALC 159 (H) 09/27/2020   TRIG 102 09/27/2020   CHOLHDL 3.7 09/27/2020     Not at foal, uncontrolled, reports taking statin infrequently Updated lab needed at/ before next visit.

## 2021-05-01 NOTE — Assessment & Plan Note (Addendum)
Caitlin Thompson is reminded of the importance of commitment to daily physical activity for 30 minutes or more, as able and the need to limit carbohydrate intake to 30 to 60 grams per meal to help with blood sugar control.   The need to take medication as prescribed, test blood sugar as directed, and to call between visits if there is a concern that blood sugar is uncontrolled is also discussed.   Caitlin Thompson is reminded of the importance of daily foot exam, annual eye examination, and good blood sugar, blood pressure and cholesterol control.  Diabetic Labs Latest Ref Rng & Units 04/29/2021 12/31/2020 09/27/2020 03/25/2020 09/05/2019  HbA1c 0.0 - 7.0 % 9.1(A) 8.6(H) 8.8(A) 10.0(H) 7.7(A)  Microalbumin mg/dL - - - - -  Micro/Creat Ratio 0 - 29 mg/g creat 415(H) - - 432(H) -  Chol 100 - 199 mg/dL - - 243(H) 262(H) -  HDL >39 mg/dL - - 66 64 -  Calc LDL 0 - 99 mg/dL - - 159(H) 173(H) -  Triglycerides 0 - 149 mg/dL - - 102 142 -  Creatinine 0.57 - 1.00 mg/dL - 0.97 0.87 0.81 -   BP/Weight 04/29/2021 12/31/2020 11/05/2020 09/27/2020 03/24/2020 09/03/2019 11/07/8331  Systolic BP 832 919 166 060 045 997 741  Diastolic BP 90 83 83 423 88 84 95  Wt. (Lbs) 202.4 202 201 202 185 199 195  BMI 34.74 34.67 34.5 34.67 32.26 34.7 34   Foot/eye exam completion dates Latest Ref Rng & Units 05/30/2019 01/21/2019  Eye Exam No Retinopathy Retinopathy(A) -  Foot exam Order - - -  Foot Form Completion - - Done   Deteriorated, reports inability to getting insulin due to cost fairly frequently and improvises  Using short acting insulin instead of the long acting insulin, during these times esp she reports hypoglycemia. Not aking metformin sates it causes fecal incontinence States will purchaes insulin today. Will increase the dose to 55 units daily , she is to send in values weekly, lilley may need 60 units Needs to exercise daily also Will attempt to get continuos blood glucose monitoring system , she has uncontrolled  IDDM

## 2021-05-01 NOTE — Progress Notes (Signed)
Caitlin Thompson     MRN: [id number]      DOB: 1960/03/25   HPI Caitlin Thompson is here for follow up and re-evaluation of chronic medical conditions, medication management and review of any available recent lab and radiology data.  Preventive health is updated, specifically  Cancer screening and Immunization.   Continues to report difficulty purchasing her medication, specifically her insulin and has uncontrolled and marked fluctuation in blood sugar. Diabetic ed recommended , she declines at this time ROS Denies recent fever or chills. Denies sinus pressure, nasal congestion, ear pain or sore throat. Denies chest congestion, productive cough or wheezing. Denies chest pains, palpitations and leg swelling Denies abdominal pain, nausea, vomiting,diarrhea or constipation.   Denies joint pain, swelling and limitation in mobility. Denies headaches, seizures, numbness, or tingling.  Denies skin break down or rash.   PE  BP (!) 150/90    Pulse 91    Resp 16    Ht 5\' 4"  (1.626 m)    Wt 202 lb 6.4 oz (91.8 kg)    SpO2 98%    BMI 34.74 kg/m   Patient alert and oriented and in no cardiopulmonary distress.  HEENT: No facial asymmetry, EOMI,     Neck supple .  Chest: Clear to auscultation bilaterally.  CVS: S1, S2 no murmurs, no S3.Regular rate.  ABD: Soft non tender.   Ext: No edema  MS: Adequate ROM spine, shoulders, hips and knees.  Skin: Intact, no ulcerations or rash noted.  Psych: Good eye contact, normal affect. Memory intact not anxious or depressed appearing.  CNS: CN 2-12 intact, power,  normal throughout.no focal deficits noted.   Assessment & Plan  Essential hypertension Uncontrolled, new is spironolactone 25 mg dailyt and reduce amlodipine to 10 mg daily DASH diet and commitment to daily physical activity for a minimum of 30 minutes discussed and encouraged, as a part of hypertension management. The importance of attaining a healthy weight is also  discussed.  BP/Weight 04/29/2021 12/31/2020 11/05/2020 09/27/2020 03/24/2020 09/03/2019 07/09/3974  Systolic BP 734 193 790 240 973 532 992  Diastolic BP 90 83 83 426 88 84 95  Wt. (Lbs) 202.4 202 201 202 185 199 195  BMI 34.74 34.67 34.5 34.67 32.26 34.7 34       Type 2 diabetes mellitus with hyperglycemia ([place]) Caitlin Thompson is reminded of the importance of commitment to daily physical activity for 30 minutes or more, as able and the need to limit carbohydrate intake to 30 to 60 grams per meal to help with blood sugar control.   The need to take medication as prescribed, test blood sugar as directed, and to call between visits if there is a concern that blood sugar is uncontrolled is also discussed.   Caitlin Thompson is reminded of the importance of daily foot exam, annual eye examination, and good blood sugar, blood pressure and cholesterol control.  Diabetic Labs Latest Ref Rng & Units 04/29/2021 12/31/2020 09/27/2020 03/25/2020 09/05/2019  HbA1c 0.0 - 7.0 % 9.1(A) 8.6(H) 8.8(A) 10.0(H) 7.7(A)  Microalbumin mg/dL - - - - -  Micro/Creat Ratio 0 - 29 mg/g creat 415(H) - - 432(H) -  Chol 100 - 199 mg/dL - - 243(H) 262(H) -  HDL >39 mg/dL - - 66 64 -  Calc LDL 0 - 99 mg/dL - - 159(H) 173(H) -  Triglycerides 0 - 149 mg/dL - - 102 142 -  Creatinine 0.57 - 1.00 mg/dL - 0.97 0.87 0.81 -   BP/Weight  04/29/2021 12/31/2020 11/05/2020 09/27/2020 03/24/2020 09/03/2019 7/82/4235  Systolic BP 361 443 154 008 676 195 093  Diastolic BP 90 83 83 267 88 84 95  Wt. (Lbs) 202.4 202 201 202 185 199 195  BMI 34.74 34.67 34.5 34.67 32.26 34.7 34   Foot/eye exam completion dates Latest Ref Rng & Units 05/30/2019 01/21/2019  Eye Exam No Retinopathy Retinopathy(A) -  Foot exam Order - - -  Foot Form Completion - - Done   Deteriorated, reports inability to getting insulin due to cost fairly frequently and improvises  Using short acting insulin instead of the long acting insulin, during these times esp she reports  hypoglycemia. Not aking metformin sates it causes fecal incontinence States will purchaes insulin today. Will increase the dose to 55 units daily , she is to send in values weekly, lilley may need 60 units Needs to exercise daily also Will attempt to get continuos blood glucose monitoring system , she has uncontrolled IDDM     Hyperlipidemia LDL goal <100 Hyperlipidemia:Low fat diet discussed and encouraged.   Lipid Panel  Lab Results  Component Value Date   CHOL 243 (H) 09/27/2020   HDL 66 09/27/2020   LDLCALC 159 (H) 09/27/2020   TRIG 102 09/27/2020   CHOLHDL 3.7 09/27/2020     Not at foal, uncontrolled, reports taking statin infrequently Updated lab needed at/ before next visit.   Obesity (BMI 30.0-34.9)  Patient re-educated about  the importance of commitment to a  minimum of 150 minutes of exercise per week as able.  The importance of healthy food choices with portion control discussed, as well as eating regularly and within a 12 hour window most days. The need to choose "clean , green" food 50 to 75% of the time is discussed, as well as to make water the primary drink and set a goal of 64 ounces water daily.    Weight /BMI 04/29/2021 12/31/2020 11/05/2020  WEIGHT 202 lb 6.4 oz 202 lb 201 lb  HEIGHT 5\' 4"  5\' 4"  5\' 4"   BMI 34.74 kg/m2 34.67 kg/m2 34.5 kg/m2  unchnaged

## 2021-05-01 NOTE — Assessment & Plan Note (Signed)
Uncontrolled, new is spironolactone 25 mg dailyt and reduce amlodipine to 10 mg daily DASH diet and commitment to daily physical activity for a minimum of 30 minutes discussed and encouraged, as a part of hypertension management. The importance of attaining a healthy weight is also discussed.  BP/Weight 04/29/2021 12/31/2020 11/05/2020 09/27/2020 03/24/2020 09/03/2019 6/50/3546  Systolic BP 568 127 517 001 749 449 675  Diastolic BP 90 83 83 916 88 84 95  Wt. (Lbs) 202.4 202 201 202 185 199 195  BMI 34.74 34.67 34.5 34.67 32.26 34.7 34

## 2021-05-25 ENCOUNTER — Telehealth: Payer: Self-pay | Admitting: Family Medicine

## 2021-06-16 ENCOUNTER — Ambulatory Visit: Payer: BC Managed Care – PPO | Admitting: Family Medicine

## 2021-06-24 ENCOUNTER — Encounter: Payer: Self-pay | Admitting: Family Medicine

## 2021-06-24 ENCOUNTER — Ambulatory Visit (INDEPENDENT_AMBULATORY_CARE_PROVIDER_SITE_OTHER): Payer: BC Managed Care – PPO | Admitting: Family Medicine

## 2021-06-24 VITALS — BP 164/94 | HR 92 | Resp 16 | Ht 63.0 in | Wt 201.0 lb

## 2021-06-24 DIAGNOSIS — I10 Essential (primary) hypertension: Secondary | ICD-10-CM

## 2021-06-24 DIAGNOSIS — E1165 Type 2 diabetes mellitus with hyperglycemia: Secondary | ICD-10-CM

## 2021-06-24 DIAGNOSIS — E785 Hyperlipidemia, unspecified: Secondary | ICD-10-CM | POA: Diagnosis not present

## 2021-06-24 DIAGNOSIS — Z794 Long term (current) use of insulin: Secondary | ICD-10-CM

## 2021-06-24 LAB — GLUCOSE, POCT (MANUAL RESULT ENTRY): POC Glucose: 126 mg/dl — AB (ref 70–99)

## 2021-06-24 MED ORDER — SPIRONOLACTONE 50 MG PO TABS: 50.0000 mg | ORAL_TABLET | Freq: Every day | ORAL | 2 refills | Status: DC

## 2021-06-24 NOTE — Patient Instructions (Addendum)
F/u May 30 or shortly after, call if you need me sooner ? ?Please get fasting labs and urine May 24 or after but at least 3 days before appt ( these are already ordered) ? ?Blood sugar today is very good...GREAT!! ? ?BP still high, start an additional spironolactone 50 mg tab, so total dose is 75 mg spironolactone daily and continue amlodipine 10 mg one daily as before ? ?You are referred to bariatric clinic, weight loss will greatly improve your health esp the diabetes ? ?Thanks for choosing [facility], we consider it a privelige to serve you. ? ?

## 2021-06-24 NOTE — Progress Notes (Signed)
? ?Caitlin Thompson     MRN: [id number]      DOB: December 12, 1960 ? ? ?HPI ?Ms. Kohlman is here for follow up and re-evaluation of chronic medical conditions, medication management and review of any available recent lab and radiology data.  ?Preventive health is updated, specifically  Cancer screening and Immunization.   ?Unable to afford continuos blood glucose monitoring system, ans also barely able to afford her insulin ?Frustrated and feels like givng up because no matter what she does, can't get blood pressure or blood sugar controlled. Has increased steps, and watches her diet ?Blood sugar often 200 or over she reports ?ROS ?Denies recent fever or chills. ?Denies sinus pressure, nasal congestion, ear pain or sore throat. ?Denies chest congestion, productive cough or wheezing. ?Denies chest pains, palpitations and leg swelling ?Denies abdominal pain, nausea, vomiting,diarrhea or constipation.   ?Denies dysuria, frequency, hesitancy or incontinence. ?Denies joint pain, swelling and limitation in mobility. ?Denies headaches, seizures, numbness, or tingling. ?Denies depression, anxiety or insomnia. ?C/o rash itchy, improving but she fears that it is medication related ?PE ? ?BP (!) 164/94   Pulse 92   Resp 16   Ht '5\' 3"'$  (1.6 m)   Wt 201 lb (91.2 kg)   SpO2 94%   BMI 35.61 kg/m?  ? ?Patient alert and oriented and in no cardiopulmonary distress. ? ?HEENT: No facial asymmetry, EOMI,     Neck supple . ? ?Chest: Clear to auscultation bilaterally. ? ?CVS: S1, S2 no murmurs, no S3.Regular rate. ? ?ABD: Soft non tender.  ? ?Ext: No edema ? ?MS: Adequate ROM spine, shoulders, hips and knees. ? ?Skin: Intact, no ulcerations or rash noted. ? ?Psych: Good eye contact, normal affect. Memory intact not anxious or depressed appearing. ? ?CNS: CN 2-12 intact, power,  normal throughout.no focal deficits noted. ? ? ?Assessment & Plan ? ?Essential hypertension ?Uncontrolled, increase spironolactone to 75 mg daily ?DASH diet and  commitment to daily physical activity for a minimum of 30 minutes discussed and encouraged, as a part of hypertension management. ?The importance of attaining a healthy weight is also discussed. ? ? ?  06/24/2021  ?  3:29 PM 06/24/2021  ?  3:11 PM 04/29/2021  ?  4:20 PM 04/29/2021  ?  3:58 PM 12/31/2020  ?  2:38 PM 11/05/2020  ?  3:52 PM 09/27/2020  ?  3:47 PM  ?BP/Weight  ?Systolic BP 767 209 470 962 156 155 182  ?Diastolic BP 94 95 90 89 83 83 107  ?Wt. (Lbs)  201  202.4 202 201 202  ?BMI  35.61 kg/m2  34.74 kg/m2 34.67 kg/m2 34.5 kg/m2 34.67 kg/m2  ? ? ? ? ? ?Morbid obesity ([place]) ? ?Patient re-educated about  the importance of commitment to a  minimum of 150 minutes of exercise per week as able. ? ?The importance of healthy food choices with portion control discussed, as well as eating regularly and within a 12 hour window most days. ?The need to choose "clean , green" food 50 to 75% of the time is discussed, as well as to make water the primary drink and set a goal of 64 ounces water daily. ?Refer to bariatric clinic , due to uncontrolled co morbidities and ongoing weight gain ?  ? ?  06/24/2021  ?  3:11 PM 04/29/2021  ?  3:58 PM 12/31/2020  ?  2:38 PM  ?Weight /BMI  ?Weight 201 lb 202 lb 6.4 oz 202 lb  ?Height '5\' 3"'$  (1.6 m) 5'  4" (1.626 m) '5\' 4"'$  (1.626 m)  ?BMI 35.61 kg/m2 34.74 kg/m2 34.67 kg/m2  ? ? ? ? ?Hyperlipidemia LDL goal <100 ?Hyperlipidemia:Low fat diet discussed and encouraged. ? ? ?Lipid Panel  ?Lab Results  ?Component Value Date  ? CHOL 243 (H) 09/27/2020  ? HDL 66 09/27/2020  ? LDLCALC 159 (H) 09/27/2020  ? TRIG 102 09/27/2020  ? CHOLHDL 3.7 09/27/2020  ? ?Uncontrolled an dnot at goal ?Updated lab needed at/ before next visit. ? ? ? ? ? ?

## 2021-06-26 ENCOUNTER — Encounter: Payer: Self-pay | Admitting: Family Medicine

## 2021-06-26 NOTE — Assessment & Plan Note (Addendum)
?  Patient re-educated about  the importance of commitment to a  minimum of 150 minutes of exercise per week as able. ? ?The importance of healthy food choices with portion control discussed, as well as eating regularly and within a 12 hour window most days. ?The need to choose "clean , green" food 50 to 75% of the time is discussed, as well as to make water the primary drink and set a goal of 64 ounces water daily. ?Refer to bariatric clinic , due to uncontrolled co morbidities and ongoing weight gain ?  ? ?  06/24/2021  ?  3:11 PM 04/29/2021  ?  3:58 PM 12/31/2020  ?  2:38 PM  ?Weight /BMI  ?Weight 201 lb 202 lb 6.4 oz 202 lb  ?Height '5\' 3"'$  (1.6 m) '5\' 4"'$  (1.626 m) '5\' 4"'$  (1.626 m)  ?BMI 35.61 kg/m2 34.74 kg/m2 34.67 kg/m2  ? ? ? ?

## 2021-06-26 NOTE — Assessment & Plan Note (Signed)
Hyperlipidemia:Low fat diet discussed and encouraged. ? ? ?Lipid Panel  ?Lab Results  ?Component Value Date  ? CHOL 243 (H) 09/27/2020  ? HDL 66 09/27/2020  ? LDLCALC 159 (H) 09/27/2020  ? TRIG 102 09/27/2020  ? CHOLHDL 3.7 09/27/2020  ? ?Uncontrolled an dnot at goal ?Updated lab needed at/ before next visit. ? ? ? ?

## 2021-06-26 NOTE — Assessment & Plan Note (Signed)
Uncontrolled, increase spironolactone to 75 mg daily ?DASH diet and commitment to daily physical activity for a minimum of 30 minutes discussed and encouraged, as a part of hypertension management. ?The importance of attaining a healthy weight is also discussed. ? ? ?  06/24/2021  ?  3:29 PM 06/24/2021  ?  3:11 PM 04/29/2021  ?  4:20 PM 04/29/2021  ?  3:58 PM 12/31/2020  ?  2:38 PM 11/05/2020  ?  3:52 PM 09/27/2020  ?  3:47 PM  ?BP/Weight  ?Systolic BP 595 638 756 433 156 155 182  ?Diastolic BP 94 95 90 89 83 83 107  ?Wt. (Lbs)  201  202.4 202 201 202  ?BMI  35.61 kg/m2  34.74 kg/m2 34.67 kg/m2 34.5 kg/m2 34.67 kg/m2  ? ? ? ? ?

## 2021-07-04 NOTE — Telephone Encounter (Signed)
No additional note to document ?

## 2021-07-25 ENCOUNTER — Other Ambulatory Visit (HOSPITAL_COMMUNITY): Payer: Self-pay | Admitting: Family Medicine

## 2021-07-25 DIAGNOSIS — R921 Mammographic calcification found on diagnostic imaging of breast: Secondary | ICD-10-CM

## 2021-08-05 ENCOUNTER — Ambulatory Visit (INDEPENDENT_AMBULATORY_CARE_PROVIDER_SITE_OTHER): Payer: BC Managed Care – PPO | Admitting: Family Medicine

## 2021-08-05 ENCOUNTER — Encounter: Payer: Self-pay | Admitting: Family Medicine

## 2021-08-05 VITALS — BP 159/88 | HR 94 | Resp 16 | Ht 64.0 in | Wt 197.1 lb

## 2021-08-05 DIAGNOSIS — I10 Essential (primary) hypertension: Secondary | ICD-10-CM | POA: Diagnosis not present

## 2021-08-05 DIAGNOSIS — E785 Hyperlipidemia, unspecified: Secondary | ICD-10-CM

## 2021-08-05 DIAGNOSIS — E1165 Type 2 diabetes mellitus with hyperglycemia: Secondary | ICD-10-CM | POA: Diagnosis not present

## 2021-08-05 DIAGNOSIS — Z794 Long term (current) use of insulin: Secondary | ICD-10-CM

## 2021-08-05 LAB — POCT GLYCOSYLATED HEMOGLOBIN (HGB A1C): HbA1c, POC (controlled diabetic range): 9.3 % — AB (ref 0.0–7.0)

## 2021-08-05 MED ORDER — INSULIN NPH ISOPHANE & REGULAR (70-30) 100 UNIT/ML ~~LOC~~ SUSP: SUBCUTANEOUS | 11 refills | Status: DC

## 2021-08-05 NOTE — Patient Instructions (Addendum)
F/u in 8 weeks, with meter and blood suagr log, call if you need me sooner  Increase insulin to 40 units in am and 10 units in evening, start  with just the 40 unit dose since you are now doing intermittenT fasting. PLEASE DO NOT GO 24 HOURS FASATING , THAT IS DANGEROUS FOR YOU AS YOU ARE DIABETIC AND ON INSULIN  NEED to test and record blood sugars before first meal and also last thing at night before sleeping , record, and send info to me weekly. Since you are uncontrolled and on insulin testing 3 to 4 times daily will be even better, and we will prescribe continuous blood glucose monitor  LABS TODAY PLEASE, CBC , CMP AND egfR AND RANDOM LIPID   Goal for fasting blood sugar ranges from 80 to 130 and 2 hours after any meal or at bedtime should be between 130 to 170.  Blood pressure is still too high, I recommend increase in spironolactone to 100 mg daily, goL IDS FOR 130/80 OR LESS. YES EXERCISE AND WEIGHT LOSS WILL HELP TO LOWER BLOOD PRESSURE,  BUT INTIL YOU GET THERE ,  BETTER TO CONTROL IT ON THE FRONT END BEFORE YOU GET ORGAN DAMAGE. PLEASE LET ME KNOW IF YOU CHANGE YOUR MIND ABOUT BP MED

## 2021-08-05 NOTE — Progress Notes (Signed)
Caitlin Thompson     MRN: [id number]      DOB: [birth year]   HPI Caitlin Thompson is here for follow up and re-evaluation of chronic medical conditions, medication management and review of any available recent lab and radiology data.  Preventive health is updated, specifically  Cancer screening and Immunization.   Questions or concerns regarding consultations or procedures which the PT has had in the interim are  addressed. The PT denies any adverse reactions to current medications since the last visit.  Started intermittent fastin 3 weeks ago, believes that if she sticks with eating plan she will be a ble to control her blood sugar and blood pressure, she unfortunately even did 2 days of total fast, I repeatedly advised against that  as she is an uncontrolled diabetic on insulin I recommend again that she has Endo treat her diabetes, states she will try this last time, and is to send in readings weekly, also will qualify for CBG as insulin is dosed twice daily moving forward , her blood sugar has worsened Exercise commitment is good and will remain ROS Denies recent fever or chills. Denies sinus pressure, nasal congestion, ear pain or sore throat. Denies chest congestion, productive cough or wheezing. Denies chest pains, palpitations and leg swelling Denies abdominal pain, nausea, vomiting,diarrhea or constipation.   Denies dysuria, frequency, hesitancy or incontinence. Denies joint pain, swelling and limitation in mobility. Denies headaches, seizures, numbness, or tingling. Denies depression, anxiety or insomnia. Denies skin break down or rash.   PE  BP (!) 159/88   Pulse 94   Resp 16   Ht '5\' 4"'$  (1.626 m)   Wt 197 lb 1.9 oz (89.4 kg)   SpO2 96%   BMI 33.84 kg/m   Patient alert and oriented and in no cardiopulmonary distress.  HEENT: No facial asymmetry, EOMI,     Neck supple .  Chest: Clear to auscultation bilaterally.  CVS: S1, S2 no murmurs, no S3.Regular rate.  ABD: Soft  non tender.   Ext: No edema  MS: Adequate ROM spine, shoulders, hips and knees.  Skin: Intact, no ulcerations or rash noted.  Psych: Good eye contact, normal affect. Memory intact not anxious or depressed appearing.  CNS: CN 2-12 intact, power,  normal throughout.no focal deficits noted.   Assessment & Plan Essential hypertension Uncontrolled, recommend inc spironolactone to 100 mg daily, she declines DASH diet and commitment to daily physical activity for a minimum of 30 minutes discussed and encouraged, as a part of hypertension management. The importance of attaining a healthy weight is also discussed.     08/05/2021    1:44 PM 06/24/2021    3:29 PM 06/24/2021    3:11 PM 04/29/2021    4:20 PM 04/29/2021    3:58 PM 12/31/2020    2:38 PM 11/05/2020    3:52 PM  BP/Weight  Systolic BP 275 170 017 494 496 759 163  Diastolic BP 88 94 95 90 89 83 83  Wt. (Lbs) 197.12  201  202.4 202 201  BMI 33.84 kg/m2  35.61 kg/m2  34.74 kg/m2 34.67 kg/m2 34.5 kg/m2       Hyperlipidemia LDL goal <100 Hyperlipidemia:Low fat diet discussed and encouraged.   Lipid Panel  Lab Results  Component Value Date   CHOL 239 (H) 08/05/2021   HDL 58 08/05/2021   LDLCALC 160 (H) 08/05/2021   TRIG 118 08/05/2021   CHOLHDL 4.1 08/05/2021     Uncontrolled and unchanged, recommend statin even 4  days/ week and reduced fati in diet  Morbid obesity ([place])  Patient re-educated about  the importance of commitment to a  minimum of 150 minutes of exercise per week as able.  The importance of healthy food choices with portion control discussed, as well as eating regularly and within a 12 hour window most days. The need to choose "clean , green" food 50 to 75% of the time is discussed, as well as to make water the primary drink and set a goal of 64 ounces water daily.       08/05/2021    1:44 PM 06/24/2021    3:11 PM 04/29/2021    3:58 PM  Weight /BMI  Weight 197 lb 1.9 oz 201 lb 202 lb 6.4 oz  Height 5'  4" (1.626 m) '5\' 3"'$  (1.6 m) '5\' 4"'$  (1.626 m)  BMI 33.84 kg/m2 35.61 kg/m2 34.74 kg/m2      Type 2 diabetes mellitus with hyperglycemia ([facility]) Deteriorated, inc insulin dose, send I weekly records Caitlin Thompson is reminded of the importance of commitment to daily physical activity for 30 minutes or more, as able and the need to limit carbohydrate intake to 30 to 60 grams per meal to help with blood sugar control.   The need to take medication as prescribed, test blood sugar as directed, and to call between visits if there is a concern that blood sugar is uncontrolled is also discussed.   Caitlin Thompson is reminded of the importance of daily foot exam, annual eye examination, and good blood sugar, blood pressure and cholesterol control.     Latest Ref Rng & Units 08/05/2021    3:44 PM 08/05/2021    2:37 PM 04/29/2021    4:50 PM 04/29/2021    4:49 PM 12/31/2020    3:23 PM  Diabetic Labs  HbA1c 0.0 - 7.0 %  9.3   9.1    8.6    Micro/Creat Ratio 0 - 29 mg/g creat    415     Chol 100 - 199 mg/dL 239        HDL >39 mg/dL 58        Calc LDL 0 - 99 mg/dL 160        Triglycerides 0 - 149 mg/dL 118        Creatinine 0.57 - 1.00 mg/dL 1.05      0.97        08/05/2021    1:44 PM 06/24/2021    3:29 PM 06/24/2021    3:11 PM 04/29/2021    4:20 PM 04/29/2021    3:58 PM 12/31/2020    2:38 PM 11/05/2020    3:52 PM  BP/Weight  Systolic BP 295 621 308 657 846 962 952  Diastolic BP 88 94 95 90 89 83 83  Wt. (Lbs) 197.12  201  202.4 202 201  BMI 33.84 kg/m2  35.61 kg/m2  34.74 kg/m2 34.67 kg/m2 34.5 kg/m2      Latest Ref Rng & Units 05/30/2019   12:00 AM 01/21/2019    3:20 PM  Foot/eye exam completion dates  Eye Exam No Retinopathy Retinopathy        Foot Form Completion   Done     This result is from an external source.

## 2021-08-06 LAB — LIPID PANEL
Chol/HDL Ratio: 4.1 ratio (ref 0.0–4.4)
Cholesterol, Total: 239 mg/dL — ABNORMAL HIGH (ref 100–199)
HDL: 58 mg/dL (ref >39)
LDL Chol Calc (NIH): 160 mg/dL — ABNORMAL HIGH (ref 0–99)
Triglycerides: 118 mg/dL (ref 0–149)
VLDL Cholesterol Cal: 21 mg/dL (ref 5–40)

## 2021-08-06 LAB — CMP14+EGFR
ALT: 15 IU/L (ref 0–32)
AST: 20 IU/L (ref 0–40)
Albumin/Globulin Ratio: 1.2 (ref 1.2–2.2)
Albumin: 4.4 g/dL (ref 3.8–4.9)
Alkaline Phosphatase: 86 IU/L (ref 44–121)
BUN/Creatinine Ratio: 15 (ref 9–23)
BUN: 16 mg/dL (ref 6–24)
Bilirubin Total: 0.5 mg/dL (ref 0.0–1.2)
CO2: 18 mmol/L — ABNORMAL LOW (ref 20–29)
Calcium: 9.8 mg/dL (ref 8.7–10.2)
Chloride: 102 mmol/L (ref 96–106)
Creatinine, Ser: 1.05 mg/dL — ABNORMAL HIGH (ref 0.57–1.00)
Globulin, Total: 3.7 g/dL (ref 1.5–4.5)
Glucose: 125 mg/dL — ABNORMAL HIGH (ref 70–99)
Potassium: 4.5 mmol/L (ref 3.5–5.2)
Sodium: 139 mmol/L (ref 134–144)
Total Protein: 8.1 g/dL (ref 6.0–8.5)
eGFR: 62 mL/min/{1.73_m2} (ref >59)

## 2021-08-06 LAB — CBC
Hematocrit: 38.5 % (ref 34.0–46.6)
Hemoglobin: 12.7 g/dL (ref 11.1–15.9)
MCH: 30.5 pg (ref 26.6–33.0)
MCHC: 33 g/dL (ref 31.5–35.7)
MCV: 93 fL (ref 79–97)
Platelets: 307 10*3/uL (ref 150–450)
RBC: 4.16 x10E6/uL (ref 3.77–5.28)
RDW: 11.9 % (ref 11.7–15.4)
WBC: 7.7 10*3/uL (ref 3.4–10.8)

## 2021-08-07 ENCOUNTER — Encounter: Payer: Self-pay | Admitting: Family Medicine

## 2021-08-07 NOTE — Assessment & Plan Note (Addendum)
Deteriorated, inc insulin dose, send I weekly records Caitlin Thompson is reminded of the importance of commitment to daily physical activity for 30 minutes or more, as able and the need to limit carbohydrate intake to 30 to 60 grams per meal to help with blood sugar control.   The need to take medication as prescribed, test blood sugar as directed, and to call between visits if there is a concern that blood sugar is uncontrolled is also discussed.   Caitlin Thompson is reminded of the importance of daily foot exam, annual eye examination, and good blood sugar, blood pressure and cholesterol control.     Latest Ref Rng & Units 08/05/2021    3:44 PM 08/05/2021    2:37 PM 04/29/2021    4:50 PM 04/29/2021    4:49 PM 12/31/2020    3:23 PM  Diabetic Labs  HbA1c 0.0 - 7.0 %  9.3   9.1    8.6    Micro/Creat Ratio 0 - 29 mg/g creat    415     Chol 100 - 199 mg/dL 239        HDL >39 mg/dL 58        Calc LDL 0 - 99 mg/dL 160        Triglycerides 0 - 149 mg/dL 118        Creatinine 0.57 - 1.00 mg/dL 1.05      0.97        08/05/2021    1:44 PM 06/24/2021    3:29 PM 06/24/2021    3:11 PM 04/29/2021    4:20 PM 04/29/2021    3:58 PM 12/31/2020    2:38 PM 11/05/2020    3:52 PM  BP/Weight  Systolic BP 774 128 786 767 209 470 962  Diastolic BP 88 94 95 90 89 83 83  Wt. (Lbs) 197.12  201  202.4 202 201  BMI 33.84 kg/m2  35.61 kg/m2  34.74 kg/m2 34.67 kg/m2 34.5 kg/m2      Latest Ref Rng & Units 05/30/2019   12:00 AM 01/21/2019    3:20 PM  Foot/eye exam completion dates  Eye Exam No Retinopathy Retinopathy        Foot Form Completion   Done     This result is from an external source.

## 2021-08-07 NOTE — Assessment & Plan Note (Signed)
  Patient re-educated about  the importance of commitment to a  minimum of 150 minutes of exercise per week as able.  The importance of healthy food choices with portion control discussed, as well as eating regularly and within a 12 hour window most days. The need to choose "clean , green" food 50 to 75% of the time is discussed, as well as to make water the primary drink and set a goal of 64 ounces water daily.       08/05/2021    1:44 PM 06/24/2021    3:11 PM 04/29/2021    3:58 PM  Weight /BMI  Weight 197 lb 1.9 oz 201 lb 202 lb 6.4 oz  Height '5\' 4"'$  (1.626 m) '5\' 3"'$  (1.6 m) '5\' 4"'$  (1.626 m)  BMI 33.84 kg/m2 35.61 kg/m2 34.74 kg/m2

## 2021-08-07 NOTE — Assessment & Plan Note (Signed)
Hyperlipidemia:Low fat diet discussed and encouraged.   Lipid Panel  Lab Results  Component Value Date   CHOL 239 (H) 08/05/2021   HDL 58 08/05/2021   LDLCALC 160 (H) 08/05/2021   TRIG 118 08/05/2021   CHOLHDL 4.1 08/05/2021     Uncontrolled and unchanged, recommend statin even 4 days/ week and reduced fati in diet

## 2021-08-07 NOTE — Assessment & Plan Note (Signed)
Uncontrolled, recommend inc spironolactone to 100 mg daily, she declines DASH diet and commitment to daily physical activity for a minimum of 30 minutes discussed and encouraged, as a part of hypertension management. The importance of attaining a healthy weight is also discussed.     08/05/2021    1:44 PM 06/24/2021    3:29 PM 06/24/2021    3:11 PM 04/29/2021    4:20 PM 04/29/2021    3:58 PM 12/31/2020    2:38 PM 11/05/2020    3:52 PM  BP/Weight  Systolic BP 429 980 699 967 227 737 505  Diastolic BP 88 94 95 90 89 83 83  Wt. (Lbs) 197.12  201  202.4 202 201  BMI 33.84 kg/m2  35.61 kg/m2  34.74 kg/m2 34.67 kg/m2 34.5 kg/m2

## 2021-08-10 ENCOUNTER — Ambulatory Visit (HOSPITAL_COMMUNITY): Payer: BC Managed Care – PPO

## 2021-08-10 ENCOUNTER — Encounter (HOSPITAL_COMMUNITY): Payer: BC Managed Care – PPO

## 2021-09-27 ENCOUNTER — Ambulatory Visit: Payer: BC Managed Care – PPO | Admitting: Family Medicine

## 2021-09-29 ENCOUNTER — Other Ambulatory Visit: Payer: Self-pay | Admitting: Family Medicine

## 2021-10-06 ENCOUNTER — Other Ambulatory Visit: Payer: Self-pay | Admitting: Family Medicine

## 2021-10-26 ENCOUNTER — Ambulatory Visit (INDEPENDENT_AMBULATORY_CARE_PROVIDER_SITE_OTHER): Payer: BC Managed Care – PPO | Admitting: Family Medicine

## 2021-10-26 ENCOUNTER — Encounter: Payer: Self-pay | Admitting: Family Medicine

## 2021-10-26 VITALS — BP 160/86 | HR 82 | Ht 63.0 in | Wt 201.0 lb

## 2021-10-26 DIAGNOSIS — E1165 Type 2 diabetes mellitus with hyperglycemia: Secondary | ICD-10-CM | POA: Diagnosis not present

## 2021-10-26 DIAGNOSIS — Z23 Encounter for immunization: Secondary | ICD-10-CM | POA: Diagnosis not present

## 2021-10-26 DIAGNOSIS — Z794 Long term (current) use of insulin: Secondary | ICD-10-CM

## 2021-10-26 DIAGNOSIS — E785 Hyperlipidemia, unspecified: Secondary | ICD-10-CM | POA: Diagnosis not present

## 2021-10-26 DIAGNOSIS — I10 Essential (primary) hypertension: Secondary | ICD-10-CM | POA: Diagnosis not present

## 2021-10-26 MED ORDER — SPIRONOLACTONE 100 MG PO TABS: 100.0000 mg | ORAL_TABLET | Freq: Every day | ORAL | 2 refills | Status: DC

## 2021-10-26 NOTE — Patient Instructions (Addendum)
F/u in  first week in October, re eval blood pressure and diabetes  Flu vaccine today New higher dose spironolactone 100 mg once daily ( you may take two 50 mg tabs together of spironolactone this is the same dose)  Increase insulin to 50 units daily for 1 week, and if blood sugar ias still over 130 fasting increase to 55 units  Goal for fasting sugar is 90 to 130  HBA1c chem 7 and EGFr nion fasting Sept 3 or shortly after  Thanks for choosing [facility], we consider it a privelige to serve you.

## 2021-11-01 ENCOUNTER — Encounter: Payer: Self-pay | Admitting: Family Medicine

## 2021-11-01 NOTE — Assessment & Plan Note (Signed)
Uncontrolled, inc spironolactone to 100 mg daily DASH diet and commitment to daily physical activity for a minimum of 30 minutes discussed and encouraged, as a part of hypertension management. The importance of attaining a healthy weight is also discussed.     10/26/2021    4:08 PM 10/26/2021    4:05 PM 08/05/2021    1:44 PM 06/24/2021    3:29 PM 06/24/2021    3:11 PM 04/29/2021    4:20 PM 04/29/2021    3:58 PM  BP/Weight  Systolic BP 709 628 366 294 765 465 035  Diastolic BP 86 88 88 94 95 90 89  Wt. (Lbs)  201 197.12  201  202.4  BMI  35.61 kg/m2 33.84 kg/m2  35.61 kg/m2  34.74 kg/m2

## 2021-11-01 NOTE — Assessment & Plan Note (Signed)
Hyperlipidemia:Low fat diet discussed and encouraged.   Lipid Panel  Lab Results  Component Value Date   CHOL 239 (H) 08/05/2021   HDL 58 08/05/2021   LDLCALC 160 (H) 08/05/2021   TRIG 118 08/05/2021   CHOLHDL 4.1 08/05/2021     Updated lab needed at/ before next visit.

## 2021-11-01 NOTE — Progress Notes (Signed)
Caitlin Thompson     MRN: [id number]      DOB: [birth year]   HPI Caitlin Thompson is here for follow up and re-evaluation of chronic medical conditions, medication management and review of any available recent lab and radiology data.  Preventive health is updated, specifically  Cancer screening and Immunization.   Questions or concerns regarding consultations or procedures which the PT has had in the interim are  addressed. The PT denies any adverse reactions to current medications since the last visit.  C/o weight gain despite regular exercise and fluctuating blood sugar, generally nearer 170 and also reports low blood sugars at times ROS Denies recent fever or chills. Denies sinus pressure, nasal congestion, ear pain or sore throat. Denies chest congestion, productive cough or wheezing. Denies chest pains, palpitations and leg swelling Denies abdominal pain, nausea, vomiting,diarrhea or constipation.   Denies dysuria, frequency, hesitancy or incontinence. Denies joint pain, swelling and limitation in mobility. Denies headaches, seizures, numbness, or tingling. . Denies skin break down or rash.   PE  BP (!) 160/86 (BP Location: Right Arm, Cuff Size: Large)   Pulse 82   Ht '5\' 3"'$  (1.6 m)   Wt 201 lb (91.2 kg)   SpO2 95%   BMI 35.61 kg/m   Patient alert and oriented and in no cardiopulmonary distress.  HEENT: No facial asymmetry, EOMI,     Neck supple .  Chest: Clear to auscultation bilaterally.  CVS: S1, S2 no murmurs, no S3.Regular rate.  ABD: Soft non tender.   Ext: No edema  MS: Adequate ROM spine, shoulders, hips and knees.  Skin: Intact, no ulcerations or rash noted.  Psych: Good eye contact, normal affect. Memory intact not anxious or depressed appearing.  CNS: CN 2-12 intact, power,  normal throughout.no focal deficits noted.   Assessment & Plan  Essential hypertension Uncontrolled, inc spironolactone to 100 mg daily DASH diet and commitment to daily  physical activity for a minimum of 30 minutes discussed and encouraged, as a part of hypertension management. The importance of attaining a healthy weight is also discussed.     10/26/2021    4:08 PM 10/26/2021    4:05 PM 08/05/2021    1:44 PM 06/24/2021    3:29 PM 06/24/2021    3:11 PM 04/29/2021    4:20 PM 04/29/2021    3:58 PM  BP/Weight  Systolic BP 081 448 185 631 497 026 378  Diastolic BP 86 88 88 94 95 90 89  Wt. (Lbs)  201 197.12  201  202.4  BMI  35.61 kg/m2 33.84 kg/m2  35.61 kg/m2  34.74 kg/m2       Type 2 diabetes mellitus with hyperglycemia (HCC) Uncontrolled, Inc insulin dose [name] is reminded of the importance of commitment to daily physical activity for 30 minutes or more, as able and the need to limit carbohydrate intake to 30 to 60 grams per meal to help with blood sugar control.   The need to take medication as prescribed, test blood sugar as directed, and to call between visits if there is a concern that blood sugar is uncontrolled is also discussed.   [name] is reminded of the importance of daily foot exam, annual eye examination, and good blood sugar, blood pressure and cholesterol control.     Latest Ref Rng & Units 08/05/2021    3:44 PM 08/05/2021    2:37 PM 04/29/2021    4:50 PM 04/29/2021    4:49 PM 12/31/2020  3:23 PM  Diabetic Labs  HbA1c 0.0 - 7.0 %  9.3  9.1   8.6   Micro/Creat Ratio 0 - 29 mg/g creat    415    Chol 100 - 199 mg/dL 239       HDL >39 mg/dL 58       Calc LDL 0 - 99 mg/dL 160       Triglycerides 0 - 149 mg/dL 118       Creatinine 0.57 - 1.00 mg/dL 1.05     0.97       10/26/2021    4:08 PM 10/26/2021    4:05 PM 08/05/2021    1:44 PM 06/24/2021    3:29 PM 06/24/2021    3:11 PM 04/29/2021    4:20 PM 04/29/2021    3:58 PM  BP/Weight  Systolic BP 833 825 053 976 734 193 790  Diastolic BP 86 88 88 94 95 90 89  Wt. (Lbs)  201 197.12  201  202.4  BMI  35.61 kg/m2 33.84 kg/m2  35.61 kg/m2  34.74 kg/m2      Latest Ref Rng & Units  05/30/2019   12:00 AM 01/21/2019    3:20 PM  Foot/eye exam completion dates  Eye Exam No Retinopathy Retinopathy       Foot Form Completion   Done     This result is from an external source.        Hyperlipidemia LDL goal <100 Hyperlipidemia:Low fat diet discussed and encouraged.   Lipid Panel  Lab Results  Component Value Date   CHOL 239 (H) 08/05/2021   HDL 58 08/05/2021   LDLCALC 160 (H) 08/05/2021   TRIG 118 08/05/2021   CHOLHDL 4.1 08/05/2021     Updated lab needed at/ before next visit.   Morbid obesity ([place])  Patient re-educated about  the importance of commitment to a  minimum of 150 minutes of exercise per week as able.  The importance of healthy food choices with portion control discussed, as well as eating regularly and within a 12 hour window most days. The need to choose "clean , green" food 50 to 75% of the time is discussed, as well as to make water the primary drink and set a goal of 64 ounces water daily.       10/26/2021    4:05 PM 08/05/2021    1:44 PM 06/24/2021    3:11 PM  Weight /BMI  Weight 201 lb 197 lb 1.9 oz 201 lb  Height '5\' 3"'$  (1.6 m) '5\' 4"'$  (1.626 m) '5\' 3"'$  (1.6 m)  BMI 35.61 kg/m2 33.84 kg/m2 35.61 kg/m2

## 2021-11-01 NOTE — Assessment & Plan Note (Signed)
  Patient re-educated about  the importance of commitment to a  minimum of 150 minutes of exercise per week as able.  The importance of healthy food choices with portion control discussed, as well as eating regularly and within a 12 hour window most days. The need to choose "clean , green" food 50 to 75% of the time is discussed, as well as to make water the primary drink and set a goal of 64 ounces water daily.       10/26/2021    4:05 PM 08/05/2021    1:44 PM 06/24/2021    3:11 PM  Weight /BMI  Weight 201 lb 197 lb 1.9 oz 201 lb  Height '5\' 3"'$  (1.6 m) '5\' 4"'$  (1.626 m) '5\' 3"'$  (1.6 m)  BMI 35.61 kg/m2 33.84 kg/m2 35.61 kg/m2

## 2021-11-01 NOTE — Assessment & Plan Note (Signed)
Uncontrolled, Inc insulin dose [name] is reminded of the importance of commitment to daily physical activity for 30 minutes or more, as able and the need to limit carbohydrate intake to 30 to 60 grams per meal to help with blood sugar control.   The need to take medication as prescribed, test blood sugar as directed, and to call between visits if there is a concern that blood sugar is uncontrolled is also discussed.   Caitlin Thompson is reminded of the importance of daily foot exam, annual eye examination, and good blood sugar, blood pressure and cholesterol control.     Latest Ref Rng & Units 08/05/2021    3:44 PM 08/05/2021    2:37 PM 04/29/2021    4:50 PM 04/29/2021    4:49 PM 12/31/2020    3:23 PM  Diabetic Labs  HbA1c 0.0 - 7.0 %  9.3  9.1   8.6   Micro/Creat Ratio 0 - 29 mg/g creat    415    Chol 100 - 199 mg/dL 239       HDL >39 mg/dL 58       Calc LDL 0 - 99 mg/dL 160       Triglycerides 0 - 149 mg/dL 118       Creatinine 0.57 - 1.00 mg/dL 1.05     0.97       10/26/2021    4:08 PM 10/26/2021    4:05 PM 08/05/2021    1:44 PM 06/24/2021    3:29 PM 06/24/2021    3:11 PM 04/29/2021    4:20 PM 04/29/2021    3:58 PM  BP/Weight  Systolic BP 102 725 366 440 347 425 956  Diastolic BP 86 88 88 94 95 90 89  Wt. (Lbs)  201 197.12  201  202.4  BMI  35.61 kg/m2 33.84 kg/m2  35.61 kg/m2  34.74 kg/m2      Latest Ref Rng & Units 05/30/2019   12:00 AM 01/21/2019    3:20 PM  Foot/eye exam completion dates  Eye Exam No Retinopathy Retinopathy       Foot Form Completion   Done     This result is from an external source.

## 2021-11-11 DIAGNOSIS — E1165 Type 2 diabetes mellitus with hyperglycemia: Secondary | ICD-10-CM | POA: Diagnosis not present

## 2021-11-11 DIAGNOSIS — Z794 Long term (current) use of insulin: Secondary | ICD-10-CM | POA: Diagnosis not present

## 2021-11-12 LAB — BMP8+EGFR
BUN/Creatinine Ratio: 18 (ref 9–23)
BUN: 16 mg/dL (ref 6–24)
CO2: 16 mmol/L — ABNORMAL LOW (ref 20–29)
Calcium: 9.5 mg/dL (ref 8.7–10.2)
Chloride: 101 mmol/L (ref 96–106)
Creatinine, Ser: 0.89 mg/dL (ref 0.57–1.00)
Glucose: 278 mg/dL — ABNORMAL HIGH (ref 70–99)
Potassium: 4.1 mmol/L (ref 3.5–5.2)
Sodium: 139 mmol/L (ref 134–144)
eGFR: 75 mL/min/{1.73_m2} (ref >59)

## 2021-11-12 LAB — HEMOGLOBIN A1C
Est. average glucose Bld gHb Est-mCnc: 212 mg/dL
Hgb A1c MFr Bld: 9 % — ABNORMAL HIGH (ref 4.8–5.6)

## 2021-11-14 ENCOUNTER — Other Ambulatory Visit: Payer: Self-pay | Admitting: Family Medicine

## 2021-11-14 MED ORDER — TIRZEPATIDE 2.5 MG/0.5ML ~~LOC~~ SOAJ: 2.5000 mg | SUBCUTANEOUS | 0 refills | Status: DC

## 2021-11-14 NOTE — Progress Notes (Signed)
Mounjaro

## 2021-11-30 ENCOUNTER — Encounter: Payer: Self-pay | Admitting: Family Medicine

## 2021-11-30 ENCOUNTER — Ambulatory Visit (INDEPENDENT_AMBULATORY_CARE_PROVIDER_SITE_OTHER): Payer: BC Managed Care – PPO | Admitting: Family Medicine

## 2021-11-30 VITALS — BP 135/88 | Ht 64.0 in | Wt 193.0 lb

## 2021-11-30 DIAGNOSIS — I10 Essential (primary) hypertension: Secondary | ICD-10-CM

## 2021-11-30 DIAGNOSIS — E1165 Type 2 diabetes mellitus with hyperglycemia: Secondary | ICD-10-CM | POA: Diagnosis not present

## 2021-11-30 DIAGNOSIS — N3001 Acute cystitis with hematuria: Secondary | ICD-10-CM | POA: Diagnosis not present

## 2021-11-30 DIAGNOSIS — Z794 Long term (current) use of insulin: Secondary | ICD-10-CM

## 2021-11-30 DIAGNOSIS — E785 Hyperlipidemia, unspecified: Secondary | ICD-10-CM

## 2021-11-30 LAB — POCT URINALYSIS DIP (CLINITEK)
Bilirubin, UA: NEGATIVE
Glucose, UA: NEGATIVE mg/dL
Ketones, POC UA: NEGATIVE mg/dL
Nitrite, UA: NEGATIVE
POC PROTEIN,UA: 100 — AB
Spec Grav, UA: 1.02 (ref 1.010–1.025)
Urobilinogen, UA: 0.2 E.U./dL
pH, UA: 5 (ref 5.0–8.0)

## 2021-11-30 MED ORDER — NITROFURANTOIN MONOHYD MACRO 100 MG PO CAPS: 100.0000 mg | ORAL_CAPSULE | Freq: Two times a day (BID) | ORAL | 0 refills | Status: DC

## 2021-11-30 NOTE — Assessment & Plan Note (Addendum)
Symptomatic with abn UA, send for c/s and start 7 day antibiotic course presumptively

## 2021-11-30 NOTE — Assessment & Plan Note (Signed)
Hyperlipidemia:Low fat diet discussed and encouraged.   Lipid Panel  Lab Results  Component Value Date   CHOL 239 (H) 08/05/2021   HDL 58 08/05/2021   LDLCALC 160 (H) 08/05/2021   TRIG 118 08/05/2021   CHOLHDL 4.1 08/05/2021   Updated lab needed at/ before next visit.

## 2021-11-30 NOTE — Assessment & Plan Note (Signed)
DASH diet and commitment to daily physical activity for a minimum of 30 minutes discussed and encouraged, as a part of hypertension management. The importance of attaining a healthy weight is also discussed.     11/30/2021   12:32 PM 10/26/2021    4:08 PM 10/26/2021    4:05 PM 08/05/2021    1:44 PM 06/24/2021    3:29 PM 06/24/2021    3:11 PM 04/29/2021    4:20 PM  BP/Weight  Systolic BP 031 594 585 929 244 628 638  Diastolic BP 88 86 88 88 94 95 90  Wt. (Lbs) 193  201 197.12  201   BMI 33.13 kg/m2  35.61 kg/m2 33.84 kg/m2  35.61 kg/m2

## 2021-11-30 NOTE — Progress Notes (Signed)
Virtual Visit via Video Note  I connected with Caitlin Thompson on 11/30/21 at 11:20 AM EDT by a video enabled telemedicine application and verified that I am speaking with the correct person using two identifiers.  Location: Patient: home Provider: office   I discussed the limitations of evaluation and management by telemedicine and the availability of in person appointments. The patient expressed understanding and agreed to proceed.  History of Present Illness: 1 month h/o generalized aches  and fever,temp of 101,malodorous urine , right flank pain, clear nasal drainage, suspects uTI Blood sugar improved, and weight lost on mounjaro.taking statin daily and reportsgood bP with new med dose Observations/Objective: BP 135/88   Ht '5\' 4"'$  (1.626 m)   Wt 193 lb (87.5 kg)   BMI 33.13 kg/m  Good communication with no confusion and intact memory. Alert and oriented x 3 No signs of respiratory distress during speech   Assessment and Plan: Acute cystitis with hematuria Symptomatic with abn UA, send for c/s and start 7 day antibiotic course presumptively  Essential hypertension DASH diet and commitment to daily physical activity for a minimum of 30 minutes discussed and encouraged, as a part of hypertension management. The importance of attaining a healthy weight is also discussed.     11/30/2021   12:32 PM 10/26/2021    4:08 PM 10/26/2021    4:05 PM 08/05/2021    1:44 PM 06/24/2021    3:29 PM 06/24/2021    3:11 PM 04/29/2021    4:20 PM  BP/Weight  Systolic BP 599 357 017 793 903 009 233  Diastolic BP 88 86 88 88 94 95 90  Wt. (Lbs) 193  201 197.12  201   BMI 33.13 kg/m2  35.61 kg/m2 33.84 kg/m2  35.61 kg/m2        Type 2 diabetes mellitus with hyperglycemia (HCC) Advised to reduce insulin to 20 units daily and even lower , FBG goa is 80 or more Caitlin Thompson is reminded of the importance of commitment to daily physical activity for 30 minutes or more, as able and the need to limit  carbohydrate intake to 30 to 60 grams per meal to help with blood sugar control.   The need to take medication as prescribed, test blood sugar as directed, and to call between visits if there is a concern that blood sugar is uncontrolled is also discussed.   Caitlin Thompson is reminded of the importance of daily foot exam, annual eye examination, and good blood sugar, blood pressure and cholesterol control.     Latest Ref Rng & Units 11/11/2021    4:04 PM 08/05/2021    3:44 PM 08/05/2021    2:37 PM 04/29/2021    4:50 PM 04/29/2021    4:49 PM  Diabetic Labs  HbA1c 4.8 - 5.6 % 9.0   9.3  9.1    Micro/Creat Ratio 0 - 29 mg/g creat     415   Chol 100 - 199 mg/dL  239      HDL >39 mg/dL  58      Calc LDL 0 - 99 mg/dL  160      Triglycerides 0 - 149 mg/dL  118      Creatinine 0.57 - 1.00 mg/dL 0.89  1.05          11/30/2021   12:32 PM 10/26/2021    4:08 PM 10/26/2021    4:05 PM 08/05/2021    1:44 PM 06/24/2021    3:29 PM 06/24/2021  3:11 PM 04/29/2021    4:20 PM  BP/Weight  Systolic BP 224 825 003 704 888 916 945  Diastolic BP 88 86 88 88 94 95 90  Wt. (Lbs) 193  201 197.12  201   BMI 33.13 kg/m2  35.61 kg/m2 33.84 kg/m2  35.61 kg/m2       Latest Ref Rng & Units 05/30/2019   12:00 AM 01/21/2019    3:20 PM  Foot/eye exam completion dates  Eye Exam No Retinopathy Retinopathy       Foot Form Completion   Done     This result is from an external source.        Morbid obesity ([place])  Patient re-educated about  the importance of commitment to a  minimum of 150 minutes of exercise per week as able.  The importance of healthy food choices with portion control discussed, as well as eating regularly and within a 12 hour window most days. The need to choose "clean , green" food 50 to 75% of the time is discussed, as well as to make water the primary drink and set a goal of 64 ounces water daily.       11/30/2021   12:32 PM 10/26/2021    4:05 PM 08/05/2021    1:44 PM  Weight /BMI  Weight 193  lb 201 lb 197 lb 1.9 oz  Height '5\' 4"'$  (1.626 m) '5\' 3"'$  (1.6 m) '5\' 4"'$  (1.626 m)  BMI 33.13 kg/m2 35.61 kg/m2 33.84 kg/m2      Hyperlipidemia LDL goal <100 Hyperlipidemia:Low fat diet discussed and encouraged.   Lipid Panel  Lab Results  Component Value Date   CHOL 239 (H) 08/05/2021   HDL 58 08/05/2021   LDLCALC 160 (H) 08/05/2021   TRIG 118 08/05/2021   CHOLHDL 4.1 08/05/2021   Updated lab needed at/ before next visit.     Follow Up Instructions:    I discussed the assessment and treatment plan with the patient. The patient was provided an opportunity to ask questions and all were answered. The patient agreed with the plan and demonstrated an understanding of the instructions.   The patient was advised to call back or seek an in-person evaluation if the symptoms worsen or if the condition fails to improve as anticipated.  I provided 15 minutes of non-face-to-face time during this encounter.   Tula Nakayama, MD

## 2021-11-30 NOTE — Assessment & Plan Note (Signed)
Advised to reduce insulin to 20 units daily and even lower , FBG goa is 80 or more Ms. Modesitt is reminded of the importance of commitment to daily physical activity for 30 minutes or more, as able and the need to limit carbohydrate intake to 30 to 60 grams per meal to help with blood sugar control.   The need to take medication as prescribed, test blood sugar as directed, and to call between visits if there is a concern that blood sugar is uncontrolled is also discussed.   Caitlin Thompson is reminded of the importance of daily foot exam, annual eye examination, and good blood sugar, blood pressure and cholesterol control.     Latest Ref Rng & Units 11/11/2021    4:04 PM 08/05/2021    3:44 PM 08/05/2021    2:37 PM 04/29/2021    4:50 PM 04/29/2021    4:49 PM  Diabetic Labs  HbA1c 4.8 - 5.6 % 9.0   9.3  9.1    Micro/Creat Ratio 0 - 29 mg/g creat     415   Chol 100 - 199 mg/dL  239      HDL >39 mg/dL  58      Calc LDL 0 - 99 mg/dL  160      Triglycerides 0 - 149 mg/dL  118      Creatinine 0.57 - 1.00 mg/dL 0.89  1.05          11/30/2021   12:32 PM 10/26/2021    4:08 PM 10/26/2021    4:05 PM 08/05/2021    1:44 PM 06/24/2021    3:29 PM 06/24/2021    3:11 PM 04/29/2021    4:20 PM  BP/Weight  Systolic BP 619 509 326 712 458 099 833  Diastolic BP 88 86 88 88 94 95 90  Wt. (Lbs) 193  201 197.12  201   BMI 33.13 kg/m2  35.61 kg/m2 33.84 kg/m2  35.61 kg/m2       Latest Ref Rng & Units 05/30/2019   12:00 AM 01/21/2019    3:20 PM  Foot/eye exam completion dates  Eye Exam No Retinopathy Retinopathy       Foot Form Completion   Done     This result is from an external source.

## 2021-11-30 NOTE — Patient Instructions (Signed)
F/U in October as before , call if you need me sooner  You will be contacted re results  of urine  Thankful that blood sugar , weight and BP are all improving by your reporting, continue to reduce insulin dose, I do not ant your blood sugar to be below 80  Thanks for choosing [facility], we consider it a privelige to serve you.

## 2021-11-30 NOTE — Assessment & Plan Note (Signed)
  Patient re-educated about  the importance of commitment to a  minimum of 150 minutes of exercise per week as able.  The importance of healthy food choices with portion control discussed, as well as eating regularly and within a 12 hour window most days. The need to choose "clean , green" food 50 to 75% of the time is discussed, as well as to make water the primary drink and set a goal of 64 ounces water daily.       11/30/2021   12:32 PM 10/26/2021    4:05 PM 08/05/2021    1:44 PM  Weight /BMI  Weight 193 lb 201 lb 197 lb 1.9 oz  Height '5\' 4"'$  (1.626 m) '5\' 3"'$  (1.6 m) '5\' 4"'$  (1.626 m)  BMI 33.13 kg/m2 35.61 kg/m2 33.84 kg/m2

## 2021-12-05 ENCOUNTER — Telehealth: Payer: Self-pay | Admitting: Family Medicine

## 2021-12-05 LAB — URINE CULTURE

## 2021-12-05 NOTE — Telephone Encounter (Signed)
Doesn't feel any better from antibiotic. Wants to be checked again- appt made, will come leave specimen

## 2021-12-05 NOTE — Telephone Encounter (Signed)
Patient called in regard to tele on 9/27  Patient still experiencing symptoms, almost done with meds.  Wants a call back in regard.

## 2021-12-06 ENCOUNTER — Telehealth: Payer: BC Managed Care – PPO | Admitting: Family Medicine

## 2021-12-06 ENCOUNTER — Telehealth: Payer: BC Managed Care – PPO | Admitting: Internal Medicine

## 2021-12-09 ENCOUNTER — Other Ambulatory Visit: Payer: Self-pay

## 2021-12-09 ENCOUNTER — Telehealth: Payer: Self-pay

## 2021-12-09 ENCOUNTER — Other Ambulatory Visit: Payer: Self-pay | Admitting: Family Medicine

## 2021-12-09 DIAGNOSIS — E1165 Type 2 diabetes mellitus with hyperglycemia: Secondary | ICD-10-CM

## 2021-12-09 MED ORDER — TIRZEPATIDE 5 MG/0.5ML ~~LOC~~ SOAJ: 5.0000 mg | SUBCUTANEOUS | 2 refills | Status: DC

## 2021-12-09 NOTE — Telephone Encounter (Signed)
5 MG DOSE SENT

## 2021-12-09 NOTE — Telephone Encounter (Signed)
Time for next dose of shots. Needs to be 5 mg, needs new prescription sent into her pharmacy.   [place], [place] - [address]  [address] [place], [place] [address]  Phone:  [phone]  Fax:  [phone]     tirzepatide [facility]) 2.5 MG/0.5ML Pen

## 2021-12-15 ENCOUNTER — Other Ambulatory Visit: Payer: Self-pay | Admitting: Family Medicine

## 2021-12-16 ENCOUNTER — Ambulatory Visit: Payer: BC Managed Care – PPO | Admitting: Family Medicine

## 2022-01-11 ENCOUNTER — Encounter: Payer: Self-pay | Admitting: Family Medicine

## 2022-01-11 ENCOUNTER — Other Ambulatory Visit: Payer: Self-pay | Admitting: Family Medicine

## 2022-01-11 ENCOUNTER — Other Ambulatory Visit: Payer: Self-pay

## 2022-01-11 ENCOUNTER — Telehealth: Payer: Self-pay | Admitting: Family Medicine

## 2022-01-11 MED ORDER — MOUNJARO 2.5 MG/0.5ML ~~LOC~~ SOAJ: SUBCUTANEOUS | 0 refills | Status: DC

## 2022-01-11 MED ORDER — TIRZEPATIDE 5 MG/0.5ML ~~LOC~~ SOAJ: 5.0000 mg | SUBCUTANEOUS | 1 refills | Status: DC

## 2022-01-11 NOTE — Telephone Encounter (Signed)
Refill sent to pharmacy.   

## 2022-01-11 NOTE — Telephone Encounter (Signed)
Patient needs refill on [facility] 2.5 MG/0.5ML Pen  [place], [place] - [address] [place], [place] [address] Phone: [phone]  Fax: [phone]

## 2022-01-11 NOTE — Telephone Encounter (Signed)
I spoke directly with pt and sent in the med needed

## 2022-01-11 NOTE — Telephone Encounter (Signed)
Pls order fasting lipid, cmp and EGFr and hBA1c for this pt to be done Dec 8  or 11 or 12 she has a visit on 12/15 and needs them before Thanks I spoke to her so she is aware

## 2022-01-12 ENCOUNTER — Other Ambulatory Visit: Payer: Self-pay

## 2022-01-12 DIAGNOSIS — E1165 Type 2 diabetes mellitus with hyperglycemia: Secondary | ICD-10-CM

## 2022-01-12 NOTE — Telephone Encounter (Signed)
Labs ordered.

## 2022-02-10 ENCOUNTER — Other Ambulatory Visit (INDEPENDENT_AMBULATORY_CARE_PROVIDER_SITE_OTHER): Payer: BC Managed Care – PPO

## 2022-02-10 DIAGNOSIS — N3001 Acute cystitis with hematuria: Secondary | ICD-10-CM

## 2022-02-10 DIAGNOSIS — Z794 Long term (current) use of insulin: Secondary | ICD-10-CM | POA: Diagnosis not present

## 2022-02-10 DIAGNOSIS — E1165 Type 2 diabetes mellitus with hyperglycemia: Secondary | ICD-10-CM | POA: Diagnosis not present

## 2022-02-10 LAB — POCT URINALYSIS DIP (CLINITEK)
Bilirubin, UA: NEGATIVE
Glucose, UA: NEGATIVE mg/dL
Ketones, POC UA: NEGATIVE mg/dL
Nitrite, UA: POSITIVE — AB
POC PROTEIN,UA: 100 — AB
Spec Grav, UA: >=1.03 — AB (ref 1.010–1.025)
Urobilinogen, UA: 0.2 E.U./dL
pH, UA: 5.5 (ref 5.0–8.0)

## 2022-02-11 LAB — LIPID PANEL
Chol/HDL Ratio: 3.4 ratio (ref 0.0–4.4)
Cholesterol, Total: 204 mg/dL — ABNORMAL HIGH (ref 100–199)
HDL: 60 mg/dL (ref >39)
LDL Chol Calc (NIH): 130 mg/dL — ABNORMAL HIGH (ref 0–99)
Triglycerides: 80 mg/dL (ref 0–149)
VLDL Cholesterol Cal: 14 mg/dL (ref 5–40)

## 2022-02-11 LAB — CMP14+EGFR
ALT: 14 IU/L (ref 0–32)
AST: 14 IU/L (ref 0–40)
Albumin/Globulin Ratio: 1.2 (ref 1.2–2.2)
Albumin: 4.2 g/dL (ref 3.8–4.9)
Alkaline Phosphatase: 78 IU/L (ref 44–121)
BUN/Creatinine Ratio: 15 (ref 9–23)
BUN: 14 mg/dL (ref 6–24)
Bilirubin Total: 0.3 mg/dL (ref 0.0–1.2)
CO2: 21 mmol/L (ref 20–29)
Calcium: 9.8 mg/dL (ref 8.7–10.2)
Chloride: 107 mmol/L — ABNORMAL HIGH (ref 96–106)
Creatinine, Ser: 0.95 mg/dL (ref 0.57–1.00)
Globulin, Total: 3.5 g/dL (ref 1.5–4.5)
Glucose: 54 mg/dL — ABNORMAL LOW (ref 70–99)
Potassium: 3.8 mmol/L (ref 3.5–5.2)
Sodium: 144 mmol/L (ref 134–144)
Total Protein: 7.7 g/dL (ref 6.0–8.5)
eGFR: 69 mL/min/{1.73_m2} (ref >59)

## 2022-02-11 LAB — HEMOGLOBIN A1C
Est. average glucose Bld gHb Est-mCnc: 137 mg/dL
Hgb A1c MFr Bld: 6.4 % — ABNORMAL HIGH (ref 4.8–5.6)

## 2022-02-15 ENCOUNTER — Other Ambulatory Visit: Payer: Self-pay | Admitting: Family Medicine

## 2022-02-15 LAB — URINE CULTURE

## 2022-02-15 MED ORDER — NITROFURANTOIN MONOHYD MACRO 100 MG PO CAPS: 100.0000 mg | ORAL_CAPSULE | Freq: Two times a day (BID) | ORAL | 0 refills | Status: DC

## 2022-02-17 ENCOUNTER — Encounter: Payer: Self-pay | Admitting: Family Medicine

## 2022-02-17 ENCOUNTER — Ambulatory Visit (INDEPENDENT_AMBULATORY_CARE_PROVIDER_SITE_OTHER): Payer: BC Managed Care – PPO | Admitting: Family Medicine

## 2022-02-17 VITALS — BP 142/88 | HR 97 | Ht 64.0 in | Wt 187.0 lb

## 2022-02-17 DIAGNOSIS — I10 Essential (primary) hypertension: Secondary | ICD-10-CM

## 2022-02-17 DIAGNOSIS — E1165 Type 2 diabetes mellitus with hyperglycemia: Secondary | ICD-10-CM

## 2022-02-17 DIAGNOSIS — Z1231 Encounter for screening mammogram for malignant neoplasm of breast: Secondary | ICD-10-CM

## 2022-02-17 DIAGNOSIS — E785 Hyperlipidemia, unspecified: Secondary | ICD-10-CM

## 2022-02-17 DIAGNOSIS — E1159 Type 2 diabetes mellitus with other circulatory complications: Secondary | ICD-10-CM

## 2022-02-17 DIAGNOSIS — E669 Obesity, unspecified: Secondary | ICD-10-CM

## 2022-02-17 LAB — GLUCOSE, POCT (MANUAL RESULT ENTRY): POC Glucose: 103 mg/dl — AB (ref 70–99)

## 2022-02-17 MED ORDER — NITROFURANTOIN MONOHYD MACRO 100 MG PO CAPS: 100.0000 mg | ORAL_CAPSULE | Freq: Two times a day (BID) | ORAL | 0 refills | Status: DC

## 2022-02-17 MED ORDER — SPIRONOLACTONE 100 MG PO TABS: 100.0000 mg | ORAL_TABLET | Freq: Every day | ORAL | 5 refills | Status: DC

## 2022-02-17 NOTE — Patient Instructions (Addendum)
Follow-up in 13 weeks, call if you need me sooner.  Mammogram to be scheduled at checkout  Non fasting HBA1C,chem 7 and EGFR and microalb 3 to 5 days before next appt  Congratulations on excellent improvement in blood sugar keep this up.  Per your report you may  not need to use insulin at all since you report using 10 very werll controlled, continue to follow blood sugar  closely.  Blood pressure is still a major concern.  Goal is less than 130/80  Please commit to spironolactone 100 mg once daily and amlodipine 10 mg once daily.  Prescriptions have been sent in for both.  Cholesterol has improved but is still too high.  Please commit to Crestor 3-4 times weekly and continue to follow a low-fat diet.  It is important that you eat over a 12 and not an 8-hour.period,   Being a diabetic.  So start having meal in the morning as well as at lunchtime and dinnertime.  Blood sugar being below 70 is not safe.

## 2022-02-19 ENCOUNTER — Encounter: Payer: Self-pay | Admitting: Family Medicine

## 2022-02-19 DIAGNOSIS — E1159 Type 2 diabetes mellitus with other circulatory complications: Secondary | ICD-10-CM | POA: Insufficient documentation

## 2022-02-19 DIAGNOSIS — E66811 Obesity, class 1: Secondary | ICD-10-CM | POA: Insufficient documentation

## 2022-02-19 DIAGNOSIS — E669 Obesity, unspecified: Secondary | ICD-10-CM | POA: Insufficient documentation

## 2022-02-19 DIAGNOSIS — E118 Type 2 diabetes mellitus with unspecified complications: Secondary | ICD-10-CM | POA: Insufficient documentation

## 2022-02-19 NOTE — Progress Notes (Signed)
Caitlin Thompson     MRN: [id number]      DOB: 05-Jun-1960   HPI Caitlin Thompson is here for follow up and re-evaluation of chronic medical conditions, medication management and review of any available recent lab and radiology data.  Preventive health is updated, specifically  Cancer screening and Immunization.   Questions or concerns regarding consultations or procedures which the PT has had in the interim are  addressed. The PT denies any adverse reactions to current medications since the last visit.  C/o gebneralized aches, no fevr, thinks it is because she is getting over her UTI Feels slightly weak and requests sugar to be checked , states it does drop to 70 sometimes as she has little apetite   ROS Denies recent fever or chills. Denies sinus pressure, nasal congestion, ear pain or sore throat. Denies chest congestion, productive cough or wheezing. Denies chest pains, palpitations and leg swelling Denies abdominal pain, nausea, vomiting,diarrhea or constipation.    Denies headaches, seizures, numbness, or tingling. Denies depression, anxiety or insomnia. Denies skin break down or rash.   PE  BP (!) 142/88 (BP Location: Right Arm, Patient Position: Sitting, Cuff Size: Normal)   Pulse 97   Ht '5\' 4"'$  (1.626 m)   Wt 187 lb 0.6 oz (84.8 kg)   SpO2 94%   BMI 32.11 kg/m   Patient alert and oriented and in no cardiopulmonary distress.  HEENT: No facial asymmetry, EOMI,     Neck supple .  Chest: Clear to auscultation bilaterally.  CVS: S1, S2 no murmurs, no S3.Regular rate.  ABD: Soft non tender. No renal angle tenderness  Ext: No edema  MS: Adequate ROM spine, shoulders, hips and knees.  Skin: Intact, no ulcerations or rash noted.  Psych: Good eye contact, normal affect. Memory intact not anxious or depressed appearing.  CNS: CN 2-12 intact, power,  normal throughout.no focal deficits noted.   Assessment & Plan  Essential hypertension Uncontrolled, pt unsure of  dose of spironolactone she is taking DASH diet and commitment to daily physical activity for a minimum of 30 minutes discussed and encouraged, as a part of hypertension management. The importance of attaining a healthy weight is also discussed.     02/17/2022    3:06 PM 02/17/2022    3:01 PM 11/30/2021   12:32 PM 10/26/2021    4:08 PM 10/26/2021    4:05 PM 08/05/2021    1:44 PM 06/24/2021    3:29 PM  BP/Weight  Systolic BP 245 809 983 382 505 397 673  Diastolic BP 88 83 88 86 88 88 94  Wt. (Lbs)  187.04 193  201 197.12   BMI  32.11 kg/m2 33.13 kg/m2  35.61 kg/m2 33.84 kg/m2        Hyperlipidemia LDL goal <100 Hyperlipidemia:Low fat diet discussed and encouraged.   Lipid Panel  Lab Results  Component Value Date   CHOL 204 (H) 02/10/2022   HDL 60 02/10/2022   LDLCALC 130 (H) 02/10/2022   TRIG 80 02/10/2022   CHOLHDL 3.4 02/10/2022  Encouraged to take statin 3 times weekly low dose     Type 2 diabetes mellitus with vascular disease (HCC) Marked improvement ,now controlled , need to reduce or d/c insulin Caitlin Thompson is reminded of the importance of commitment to daily physical activity for 30 minutes or more, as able and the need to limit carbohydrate intake to 30 to 60 grams per meal to help with blood sugar control.   The need  to take medication as prescribed, test blood sugar as directed, and to call between visits if there is a concern that blood sugar is uncontrolled is also discussed.   Caitlin Thompson is reminded of the importance of daily foot exam, annual eye examination, and good blood sugar, blood pressure and cholesterol control.     Latest Ref Rng & Units 02/10/2022    4:05 PM 11/11/2021    4:04 PM 08/05/2021    3:44 PM 08/05/2021    2:37 PM 04/29/2021    4:50 PM  Diabetic Labs  HbA1c 4.8 - 5.6 % 6.4  9.0   9.3  9.1   Chol 100 - 199 mg/dL 204   239     HDL >39 mg/dL 60   58     Calc LDL 0 - 99 mg/dL 130   160     Triglycerides 0 - 149 mg/dL 80   118     Creatinine  0.57 - 1.00 mg/dL 0.95  0.89  1.05         02/17/2022    3:06 PM 02/17/2022    3:01 PM 11/30/2021   12:32 PM 10/26/2021    4:08 PM 10/26/2021    4:05 PM 08/05/2021    1:44 PM 06/24/2021    3:29 PM  BP/Weight  Systolic BP 630 160 109 323 557 322 025  Diastolic BP 88 83 88 86 88 88 94  Wt. (Lbs)  187.04 193  201 197.12   BMI  32.11 kg/m2 33.13 kg/m2  35.61 kg/m2 33.84 kg/m2       Latest Ref Rng & Units 02/17/2022    3:00 PM 05/30/2019   12:00 AM  Foot/eye exam completion dates  Eye Exam No Retinopathy  Retinopathy      Foot Form Completion  Done      This result is from an external source.        Obesity (BMI 30.0-34.9)  Patient re-educated about  the importance of commitment to a  minimum of 150 minutes of exercise per week as able.  The importance of healthy food choices with portion control discussed, as well as eating regularly and within a 12 hour window most days. The need to choose "clean , green" food 50 to 75% of the time is discussed, as well as to make water the primary drink and set a goal of 64 ounces water daily.       02/17/2022    3:01 PM 11/30/2021   12:32 PM 10/26/2021    4:05 PM  Weight /BMI  Weight 187 lb 0.6 oz 193 lb 201 lb  Height '5\' 4"'$  (1.626 m) '5\' 4"'$  (1.626 m) '5\' 3"'$  (1.6 m)  BMI 32.11 kg/m2 33.13 kg/m2 35.61 kg/m2    Improved on current med regime,along with diet and regular execise

## 2022-02-19 NOTE — Assessment & Plan Note (Signed)
Hyperlipidemia:Low fat diet discussed and encouraged.   Lipid Panel  Lab Results  Component Value Date   CHOL 204 (H) 02/10/2022   HDL 60 02/10/2022   LDLCALC 130 (H) 02/10/2022   TRIG 80 02/10/2022   CHOLHDL 3.4 02/10/2022  Encouraged to take statin 3 times weekly low dose

## 2022-02-19 NOTE — Assessment & Plan Note (Signed)
Uncontrolled, pt unsure of dose of spironolactone she is taking DASH diet and commitment to daily physical activity for a minimum of 30 minutes discussed and encouraged, as a part of hypertension management. The importance of attaining a healthy weight is also discussed.     02/17/2022    3:06 PM 02/17/2022    3:01 PM 11/30/2021   12:32 PM 10/26/2021    4:08 PM 10/26/2021    4:05 PM 08/05/2021    1:44 PM 06/24/2021    3:29 PM  BP/Weight  Systolic BP 308 657 846 962 952 841 324  Diastolic BP 88 83 88 86 88 88 94  Wt. (Lbs)  187.04 193  201 197.12   BMI  32.11 kg/m2 33.13 kg/m2  35.61 kg/m2 33.84 kg/m2

## 2022-02-19 NOTE — Assessment & Plan Note (Signed)
  Patient re-educated about  the importance of commitment to a  minimum of 150 minutes of exercise per week as able.  The importance of healthy food choices with portion control discussed, as well as eating regularly and within a 12 hour window most days. The need to choose "clean , green" food 50 to 75% of the time is discussed, as well as to make water the primary drink and set a goal of 64 ounces water daily.       02/17/2022    3:01 PM 11/30/2021   12:32 PM 10/26/2021    4:05 PM  Weight /BMI  Weight 187 lb 0.6 oz 193 lb 201 lb  Height '5\' 4"'$  (1.626 m) '5\' 4"'$  (1.626 m) '5\' 3"'$  (1.6 m)  BMI 32.11 kg/m2 33.13 kg/m2 35.61 kg/m2    Improved on current med regime,along with diet and regular execise

## 2022-02-19 NOTE — Assessment & Plan Note (Signed)
Marked improvement ,now controlled , need to reduce or d/c insulin Caitlin Thompson is reminded of the importance of commitment to daily physical activity for 30 minutes or more, as able and the need to limit carbohydrate intake to 30 to 60 grams per meal to help with blood sugar control.   The need to take medication as prescribed, test blood sugar as directed, and to call between visits if there is a concern that blood sugar is uncontrolled is also discussed.   Caitlin Thompson is reminded of the importance of daily foot exam, annual eye examination, and good blood sugar, blood pressure and cholesterol control.     Latest Ref Rng & Units 02/10/2022    4:05 PM 11/11/2021    4:04 PM 08/05/2021    3:44 PM 08/05/2021    2:37 PM 04/29/2021    4:50 PM  Diabetic Labs  HbA1c 4.8 - 5.6 % 6.4  9.0   9.3  9.1   Chol 100 - 199 mg/dL 204   239     HDL >39 mg/dL 60   58     Calc LDL 0 - 99 mg/dL 130   160     Triglycerides 0 - 149 mg/dL 80   118     Creatinine 0.57 - 1.00 mg/dL 0.95  0.89  1.05         02/17/2022    3:06 PM 02/17/2022    3:01 PM 11/30/2021   12:32 PM 10/26/2021    4:08 PM 10/26/2021    4:05 PM 08/05/2021    1:44 PM 06/24/2021    3:29 PM  BP/Weight  Systolic BP 865 784 696 295 284 132 440  Diastolic BP 88 83 88 86 88 88 94  Wt. (Lbs)  187.04 193  201 197.12   BMI  32.11 kg/m2 33.13 kg/m2  35.61 kg/m2 33.84 kg/m2       Latest Ref Rng & Units 02/17/2022    3:00 PM 05/30/2019   12:00 AM  Foot/eye exam completion dates  Eye Exam No Retinopathy  Retinopathy      Foot Form Completion  Done      This result is from an external source.

## 2022-05-17 ENCOUNTER — Other Ambulatory Visit: Payer: Self-pay | Admitting: Family Medicine

## 2022-05-19 ENCOUNTER — Ambulatory Visit (INDEPENDENT_AMBULATORY_CARE_PROVIDER_SITE_OTHER): Payer: BC Managed Care – PPO | Admitting: Family Medicine

## 2022-05-19 ENCOUNTER — Encounter: Payer: Self-pay | Admitting: Family Medicine

## 2022-05-19 VITALS — BP 150/90 | HR 84 | Ht 64.0 in | Wt 183.0 lb

## 2022-05-19 DIAGNOSIS — E559 Vitamin D deficiency, unspecified: Secondary | ICD-10-CM

## 2022-05-19 DIAGNOSIS — E669 Obesity, unspecified: Secondary | ICD-10-CM

## 2022-05-19 DIAGNOSIS — E1159 Type 2 diabetes mellitus with other circulatory complications: Secondary | ICD-10-CM

## 2022-05-19 DIAGNOSIS — I10 Essential (primary) hypertension: Secondary | ICD-10-CM

## 2022-05-19 DIAGNOSIS — R300 Dysuria: Secondary | ICD-10-CM

## 2022-05-19 DIAGNOSIS — E785 Hyperlipidemia, unspecified: Secondary | ICD-10-CM | POA: Diagnosis not present

## 2022-05-19 MED ORDER — CARVEDILOL 3.125 MG PO TABS: 3.1250 mg | ORAL_TABLET | Freq: Two times a day (BID) | ORAL | 5 refills | Status: DC

## 2022-05-19 NOTE — Patient Instructions (Addendum)
F/U in 13 weeks, call if you need me sooner  New additional medication for blood pressure cavedilol one tablet two times daily, take 12 hours apart,  for best effect, continue thw medications that you are currently taking  Labs today, Microalb  and urine c/s, HBA1C, cmp and EGFr , lipid panel  Please schedule mammogram at checkout, screening [facility]  It is important that you exercise regularly at least 30 minutes 5 times a week. If you develop chest pain, have severe difficulty breathing, or feel very tired, stop exercising immediately and seek medical attention   Thanks for choosing [facility], we consider it a privelige to serve you.

## 2022-05-22 ENCOUNTER — Encounter: Payer: Self-pay | Admitting: Family Medicine

## 2022-05-22 DIAGNOSIS — R300 Dysuria: Secondary | ICD-10-CM | POA: Insufficient documentation

## 2022-05-22 DIAGNOSIS — E559 Vitamin D deficiency, unspecified: Secondary | ICD-10-CM | POA: Insufficient documentation

## 2022-05-22 LAB — CMP14+EGFR
ALT: 15 IU/L (ref 0–32)
AST: 16 IU/L (ref 0–40)
Albumin/Globulin Ratio: 1.4 (ref 1.2–2.2)
Albumin: 4.7 g/dL (ref 3.8–4.9)
Alkaline Phosphatase: 81 IU/L (ref 44–121)
BUN/Creatinine Ratio: 17 (ref 9–23)
BUN: 16 mg/dL (ref 6–24)
Bilirubin Total: 0.3 mg/dL (ref 0.0–1.2)
CO2: 25 mmol/L (ref 20–29)
Calcium: 10.1 mg/dL (ref 8.7–10.2)
Chloride: 103 mmol/L (ref 96–106)
Creatinine, Ser: 0.96 mg/dL (ref 0.57–1.00)
Globulin, Total: 3.3 g/dL (ref 1.5–4.5)
Glucose: 135 mg/dL — ABNORMAL HIGH (ref 70–99)
Potassium: 4.3 mmol/L (ref 3.5–5.2)
Sodium: 143 mmol/L (ref 134–144)
Total Protein: 8 g/dL (ref 6.0–8.5)
eGFR: 68 mL/min/{1.73_m2} (ref >59)

## 2022-05-22 LAB — MICROALBUMIN / CREATININE URINE RATIO
Creatinine, Urine: 80.3 mg/dL
Microalb/Creat Ratio: 273 mg/g creat — ABNORMAL HIGH (ref 0–29)
Microalbumin, Urine: 218.9 ug/mL

## 2022-05-22 LAB — CBC
Hematocrit: 39.1 % (ref 34.0–46.6)
Hemoglobin: 13.1 g/dL (ref 11.1–15.9)
MCH: 31.3 pg (ref 26.6–33.0)
MCHC: 33.5 g/dL (ref 31.5–35.7)
MCV: 93 fL (ref 79–97)
Platelets: 361 10*3/uL (ref 150–450)
RBC: 4.19 x10E6/uL (ref 3.77–5.28)
RDW: 11.9 % (ref 11.7–15.4)
WBC: 9 10*3/uL (ref 3.4–10.8)

## 2022-05-22 LAB — LIPID PANEL
Chol/HDL Ratio: 3.9 ratio (ref 0.0–4.4)
Cholesterol, Total: 240 mg/dL — ABNORMAL HIGH (ref 100–199)
HDL: 61 mg/dL (ref >39)
LDL Chol Calc (NIH): 158 mg/dL — ABNORMAL HIGH (ref 0–99)
Triglycerides: 118 mg/dL (ref 0–149)
VLDL Cholesterol Cal: 21 mg/dL (ref 5–40)

## 2022-05-22 LAB — HEMOGLOBIN A1C
Est. average glucose Bld gHb Est-mCnc: 140 mg/dL
Hgb A1c MFr Bld: 6.5 % — ABNORMAL HIGH (ref 4.8–5.6)

## 2022-05-22 NOTE — Assessment & Plan Note (Signed)
Controlled, no change in medication Caitlin Thompson is reminded of the importance of commitment to daily physical activity for 30 minutes or more, as able and the need to limit carbohydrate intake to 30 to 60 grams per meal to help with blood sugar control.   The need to take medication as prescribed, test blood sugar as directed, and to call between visits if there is a concern that blood sugar is uncontrolled is also discussed.   Caitlin Thompson is reminded of the importance of daily foot exam, annual eye examination, and good blood sugar, blood pressure and cholesterol control.     Latest Ref Rng & Units 05/19/2022    4:12 PM 02/10/2022    4:05 PM 11/11/2021    4:04 PM 08/05/2021    3:44 PM 08/05/2021    2:37 PM  Diabetic Labs  HbA1c 4.8 - 5.6 % 6.5  6.4  9.0   9.3   Micro/Creat Ratio  WILL FOLLOW  P      Chol 100 - 199 mg/dL 240  204   239    HDL >39 mg/dL 61  60   58    Calc LDL 0 - 99 mg/dL 158  130   160    Triglycerides 0 - 149 mg/dL 118  80   118    Creatinine 0.57 - 1.00 mg/dL 0.96  0.95  0.89  1.05      P Preliminary result      05/19/2022    3:43 PM 05/19/2022    3:41 PM 05/19/2022    3:13 PM 05/19/2022    3:10 PM 02/17/2022    3:06 PM 02/17/2022    3:01 PM 11/30/2021   12:32 PM  BP/Weight  Systolic BP Q000111Q 0000000 [id number] [id number] 0000000 A999333  Diastolic BP 90 90 82 84 88 83 88  Wt. (Lbs)    183.04  187.04 193  BMI    31.42 kg/m2  32.11 kg/m2 33.13 kg/m2      Latest Ref Rng & Units 02/17/2022    3:00 PM 05/30/2019   12:00 AM  Foot/eye exam completion dates  Eye Exam No Retinopathy  Retinopathy      Foot Form Completion  Done      This result is from an external source.

## 2022-05-22 NOTE — Assessment & Plan Note (Signed)
Improved   Patient re-educated about  the importance of commitment to a  minimum of 150 minutes of exercise per week as able.  The importance of healthy food choices with portion control discussed, as well as eating regularly and within a 12 hour window most days. The need to choose "clean , green" food 50 to 75% of the time is discussed, as well as to make water the primary drink and set a goal of 64 ounces water daily.       05/19/2022    3:10 PM 02/17/2022    3:01 PM 11/30/2021   12:32 PM  Weight /BMI  Weight 183 lb 0.6 oz 187 lb 0.6 oz 193 lb  Height 5\' 4"  (1.626 m) 5\' 4"  (1.626 m) 5\' 4"  (1.626 m)  BMI 31.42 kg/m2 32.11 kg/m2 33.13 kg/m2

## 2022-05-22 NOTE — Progress Notes (Signed)
Caitlin Thompson     MRN: [id number]      DOB: 1960/09/13   HPI Caitlin Thompson is here for follow up and re-evaluation of chronic medical conditions, medication management and review of any available recent lab and radiology data.  Preventive health is updated, specifically  Cancer screening and Immunization.   Questions or concerns regarding consultations or procedures which the PT has had in the interim are  addressed. The PT denies any adverse reactions to current medications since the last visit.  There are no new concerns.  There are no specific complaints  Denies polyuria, polydipsia, blurred vision , or hypoglycemic episodes.   ROS Denies recent fever or chills. Denies sinus pressure, nasal congestion, ear pain or sore throat. Denies chest congestion, productive cough or wheezing. Denies chest pains, palpitations and leg swelling Denies abdominal pain, nausea, vomiting,diarrhea or constipation.   Denies dysuria, frequency, hesitancy or incontinence. Denies joint pain, swelling and limitation in mobility. Denies headaches, seizures, numbness, or tingling. Denies depression, anxiety or insomnia. Denies skin break down or rash.   PE  BP (!) 150/90   Pulse 84   Ht 5\' 4"  (1.626 m)   Wt 183 lb 0.6 oz (83 kg)   SpO2 92%   BMI 31.42 kg/m   Patient alert and oriented and in no cardiopulmonary distress.  HEENT: No facial asymmetry, EOMI,     Neck supple .  Chest: Clear to auscultation bilaterally.  CVS: S1, S2 no murmurs, no S3.Regular rate.  ABD: Soft non tender.   Ext: No edema  MS: Adequate ROM spine, shoulders, hips and knees.  Skin: Intact, no ulcerations or rash noted.  Psych: Good eye contact, normal affect. Memory intact not anxious or depressed appearing.  CNS: CN 2-12 intact, power,  normal throughout.no focal deficits noted.   Assessment & Plan  Type 2 diabetes mellitus with vascular disease ([place]) Controlled, no change in medication Caitlin Thompson is  reminded of the importance of commitment to daily physical activity for 30 minutes or more, as able and the need to limit carbohydrate intake to 30 to 60 grams per meal to help with blood sugar control.   The need to take medication as prescribed, test blood sugar as directed, and to call between visits if there is a concern that blood sugar is uncontrolled is also discussed.   Caitlin Thompson is reminded of the importance of daily foot exam, annual eye examination, and good blood sugar, blood pressure and cholesterol control.     Latest Ref Rng & Units 05/19/2022    4:12 PM 02/10/2022    4:05 PM 11/11/2021    4:04 PM 08/05/2021    3:44 PM 08/05/2021    2:37 PM  Diabetic Labs  HbA1c 4.8 - 5.6 % 6.5  6.4  9.0   9.3   Micro/Creat Ratio  WILL FOLLOW  P      Chol 100 - 199 mg/dL 240  204   239    HDL >39 mg/dL 61  60   58    Calc LDL 0 - 99 mg/dL 158  130   160    Triglycerides 0 - 149 mg/dL 118  80   118    Creatinine 0.57 - 1.00 mg/dL 0.96  0.95  0.89  1.05      P Preliminary result      05/19/2022    3:43 PM 05/19/2022    3:41 PM 05/19/2022    3:13 PM 05/19/2022  3:10 PM 02/17/2022    3:06 PM 02/17/2022    3:01 PM 11/30/2021   12:32 PM  BP/Weight  Systolic BP Q000111Q 0000000 A999333 [id number] A999333 0000000 A999333  Diastolic BP 90 90 82 84 88 83 88  Wt. (Lbs)    183.04  187.04 193  BMI    31.42 kg/m2  32.11 kg/m2 33.13 kg/m2      Latest Ref Rng & Units 02/17/2022    3:00 PM 05/30/2019   12:00 AM  Foot/eye exam completion dates  Eye Exam No Retinopathy  Retinopathy      Foot Form Completion  Done      This result is from an external source.        Essential hypertension Uncontrolled add coreg DASH diet and commitment to daily physical activity for a minimum of 30 minutes discussed and encouraged, as a part of hypertension management. The importance of attaining a healthy weight is also discussed.     05/19/2022    3:43 PM 05/19/2022    3:41 PM 05/19/2022    3:13 PM 05/19/2022    3:10 PM 02/17/2022     3:06 PM 02/17/2022    3:01 PM 11/30/2021   12:32 PM  BP/Weight  Systolic BP Q000111Q 0000000 [id number] [id number] A999333 0000000 A999333  Diastolic BP 90 90 82 84 88 83 88  Wt. (Lbs)    183.04  187.04 193  BMI    31.42 kg/m2  32.11 kg/m2 33.13 kg/m2       Hyperlipidemia LDL goal <100 Hyperlipidemia:Low fat diet discussed and encouraged.   Lipid Panel  Lab Results  Component Value Date   CHOL 240 (H) 05/19/2022   HDL 61 05/19/2022   LDLCALC 158 (H) 05/19/2022   TRIG 118 05/19/2022   CHOLHDL 3.9 05/19/2022     Not at goal increase md commitment and lower fat intake  Obesity (BMI 30.0-34.9) Improved   Patient re-educated about  the importance of commitment to a  minimum of 150 minutes of exercise per week as able.  The importance of healthy food choices with portion control discussed, as well as eating regularly and within a 12 hour window most days. The need to choose "clean , green" food 50 to 75% of the time is discussed, as well as to make water the primary drink and set a goal of 64 ounces water daily.       05/19/2022    3:10 PM 02/17/2022    3:01 PM 11/30/2021   12:32 PM  Weight /BMI  Weight 183 lb 0.6 oz 187 lb 0.6 oz 193 lb  Height 5\' 4"  (1.626 m) 5\' 4"  (1.626 m) 5\' 4"  (1.626 m)  BMI 31.42 kg/m2 32.11 kg/m2 33.13 kg/m2      Dysuria Recurrent UTI and mildly symptomatic, urine for c/s

## 2022-05-22 NOTE — Assessment & Plan Note (Signed)
Recurrent UTI and mildly symptomatic, urine for c/s

## 2022-05-22 NOTE — Assessment & Plan Note (Signed)
Hyperlipidemia:Low fat diet discussed and encouraged.   Lipid Panel  Lab Results  Component Value Date   CHOL 240 (H) 05/19/2022   HDL 61 05/19/2022   LDLCALC 158 (H) 05/19/2022   TRIG 118 05/19/2022   CHOLHDL 3.9 05/19/2022     Not at goal increase md commitment and lower fat intake

## 2022-05-22 NOTE — Assessment & Plan Note (Signed)
Updated lab needed at/ before next visit.   

## 2022-05-22 NOTE — Assessment & Plan Note (Signed)
Uncontrolled add coreg DASH diet and commitment to daily physical activity for a minimum of 30 minutes discussed and encouraged, as a part of hypertension management. The importance of attaining a healthy weight is also discussed.     05/19/2022    3:43 PM 05/19/2022    3:41 PM 05/19/2022    3:13 PM 05/19/2022    3:10 PM 02/17/2022    3:06 PM 02/17/2022    3:01 PM 11/30/2021   12:32 PM  BP/Weight  Systolic BP Q000111Q 0000000 [id number] [id number] A999333 0000000 A999333  Diastolic BP 90 90 82 84 88 83 88  Wt. (Lbs)    183.04  187.04 193  BMI    31.42 kg/m2  32.11 kg/m2 33.13 kg/m2

## 2022-05-24 ENCOUNTER — Telehealth: Payer: Self-pay | Admitting: Family Medicine

## 2022-05-24 LAB — URINE CULTURE

## 2022-05-24 MED ORDER — NITROFURANTOIN MONOHYD MACRO 100 MG PO CAPS: 100.0000 mg | ORAL_CAPSULE | Freq: Two times a day (BID) | ORAL | 0 refills | Status: DC

## 2022-05-24 NOTE — Addendum Note (Signed)
Addended by: Fayrene Helper on: 05/24/2022 07:11 AM   Modules accepted: Orders

## 2022-05-24 NOTE — Telephone Encounter (Signed)
Pt returning call

## 2022-05-24 NOTE — Telephone Encounter (Signed)
Patient aware of lab results.

## 2022-06-13 ENCOUNTER — Encounter (HOSPITAL_COMMUNITY): Payer: BC Managed Care – PPO

## 2022-06-13 ENCOUNTER — Ambulatory Visit (HOSPITAL_COMMUNITY): Payer: BC Managed Care – PPO

## 2022-07-06 ENCOUNTER — Inpatient Hospital Stay (HOSPITAL_COMMUNITY)
Admission: EM | Admit: 2022-07-06 | Discharge: 2022-07-11 | DRG: 872 | Disposition: A | Discharge status: Home / Self Care | Payer: BC Managed Care – PPO | Attending: Internal Medicine | Admitting: Internal Medicine

## 2022-07-06 ENCOUNTER — Other Ambulatory Visit: Payer: Self-pay

## 2022-07-06 ENCOUNTER — Encounter (HOSPITAL_COMMUNITY): Payer: Self-pay | Admitting: *Deleted

## 2022-07-06 ENCOUNTER — Emergency Department (HOSPITAL_COMMUNITY): Payer: BC Managed Care – PPO

## 2022-07-06 DIAGNOSIS — N10 Acute tubulo-interstitial nephritis: Secondary | ICD-10-CM | POA: Diagnosis present

## 2022-07-06 DIAGNOSIS — Z880 Allergy status to penicillin: Secondary | ICD-10-CM

## 2022-07-06 DIAGNOSIS — Z4803 Encounter for change or removal of drains: Secondary | ICD-10-CM | POA: Diagnosis not present

## 2022-07-06 DIAGNOSIS — E1165 Type 2 diabetes mellitus with hyperglycemia: Secondary | ICD-10-CM | POA: Diagnosis present

## 2022-07-06 DIAGNOSIS — E782 Mixed hyperlipidemia: Secondary | ICD-10-CM | POA: Diagnosis not present

## 2022-07-06 DIAGNOSIS — Z6831 Body mass index (BMI) 31.0-31.9, adult: Secondary | ICD-10-CM

## 2022-07-06 DIAGNOSIS — Z841 Family history of disorders of kidney and ureter: Secondary | ICD-10-CM | POA: Diagnosis not present

## 2022-07-06 DIAGNOSIS — R9431 Abnormal electrocardiogram [ECG] [EKG]: Secondary | ICD-10-CM | POA: Diagnosis not present

## 2022-07-06 DIAGNOSIS — Z888 Allergy status to other drugs, medicaments and biological substances status: Secondary | ICD-10-CM

## 2022-07-06 DIAGNOSIS — R718 Other abnormality of red blood cells: Secondary | ICD-10-CM | POA: Diagnosis not present

## 2022-07-06 DIAGNOSIS — R6520 Severe sepsis without septic shock: Secondary | ICD-10-CM | POA: Diagnosis present

## 2022-07-06 DIAGNOSIS — N132 Hydronephrosis with renal and ureteral calculous obstruction: Secondary | ICD-10-CM | POA: Diagnosis not present

## 2022-07-06 DIAGNOSIS — N200 Calculus of kidney: Secondary | ICD-10-CM | POA: Diagnosis not present

## 2022-07-06 DIAGNOSIS — R1084 Generalized abdominal pain: Secondary | ICD-10-CM | POA: Diagnosis not present

## 2022-07-06 DIAGNOSIS — N179 Acute kidney failure, unspecified: Secondary | ICD-10-CM | POA: Diagnosis present

## 2022-07-06 DIAGNOSIS — N202 Calculus of kidney with calculus of ureter: Secondary | ICD-10-CM | POA: Diagnosis present

## 2022-07-06 DIAGNOSIS — Z87442 Personal history of urinary calculi: Secondary | ICD-10-CM | POA: Diagnosis not present

## 2022-07-06 DIAGNOSIS — I700 Atherosclerosis of aorta: Secondary | ICD-10-CM | POA: Diagnosis not present

## 2022-07-06 DIAGNOSIS — N390 Urinary tract infection, site not specified: Secondary | ICD-10-CM | POA: Diagnosis not present

## 2022-07-06 DIAGNOSIS — Z885 Allergy status to narcotic agent status: Secondary | ICD-10-CM

## 2022-07-06 DIAGNOSIS — R0602 Shortness of breath: Secondary | ICD-10-CM | POA: Diagnosis not present

## 2022-07-06 DIAGNOSIS — Z833 Family history of diabetes mellitus: Secondary | ICD-10-CM

## 2022-07-06 DIAGNOSIS — R1031 Right lower quadrant pain: Secondary | ICD-10-CM | POA: Diagnosis present

## 2022-07-06 DIAGNOSIS — R109 Unspecified abdominal pain: Secondary | ICD-10-CM

## 2022-07-06 DIAGNOSIS — Z79899 Other long term (current) drug therapy: Secondary | ICD-10-CM | POA: Diagnosis not present

## 2022-07-06 DIAGNOSIS — E876 Hypokalemia: Secondary | ICD-10-CM | POA: Insufficient documentation

## 2022-07-06 DIAGNOSIS — I10 Essential (primary) hypertension: Secondary | ICD-10-CM | POA: Diagnosis not present

## 2022-07-06 DIAGNOSIS — Z8249 Family history of ischemic heart disease and other diseases of the circulatory system: Secondary | ICD-10-CM

## 2022-07-06 DIAGNOSIS — Z1152 Encounter for screening for COVID-19: Secondary | ICD-10-CM

## 2022-07-06 DIAGNOSIS — E669 Obesity, unspecified: Secondary | ICD-10-CM | POA: Diagnosis not present

## 2022-07-06 DIAGNOSIS — N138 Other obstructive and reflux uropathy: Principal | ICD-10-CM

## 2022-07-06 DIAGNOSIS — E1159 Type 2 diabetes mellitus with other circulatory complications: Secondary | ICD-10-CM

## 2022-07-06 DIAGNOSIS — Z882 Allergy status to sulfonamides status: Secondary | ICD-10-CM

## 2022-07-06 DIAGNOSIS — N201 Calculus of ureter: Secondary | ICD-10-CM | POA: Diagnosis not present

## 2022-07-06 DIAGNOSIS — N136 Pyonephrosis: Secondary | ICD-10-CM | POA: Diagnosis not present

## 2022-07-06 DIAGNOSIS — N1330 Unspecified hydronephrosis: Secondary | ICD-10-CM | POA: Diagnosis not present

## 2022-07-06 DIAGNOSIS — Z86010 Personal history of colonic polyps: Secondary | ICD-10-CM

## 2022-07-06 DIAGNOSIS — A419 Sepsis, unspecified organism: Secondary | ICD-10-CM | POA: Diagnosis not present

## 2022-07-06 DIAGNOSIS — Z794 Long term (current) use of insulin: Secondary | ICD-10-CM

## 2022-07-06 DIAGNOSIS — R1110 Vomiting, unspecified: Secondary | ICD-10-CM | POA: Diagnosis not present

## 2022-07-06 DIAGNOSIS — R110 Nausea: Secondary | ICD-10-CM | POA: Diagnosis not present

## 2022-07-06 DIAGNOSIS — E785 Hyperlipidemia, unspecified: Secondary | ICD-10-CM | POA: Diagnosis present

## 2022-07-06 LAB — CBC WITH DIFFERENTIAL/PLATELET
Abs Immature Granulocytes: 0.09 10*3/uL — ABNORMAL HIGH (ref 0.00–0.07)
Basophils Absolute: 0.1 10*3/uL (ref 0.0–0.1)
Basophils Relative: 0 %
Eosinophils Absolute: 0.1 10*3/uL (ref 0.0–0.5)
Eosinophils Relative: 0 %
HCT: 43.3 % (ref 36.0–46.0)
Hemoglobin: 13.2 g/dL (ref 12.0–15.0)
Immature Granulocytes: 1 %
Lymphocytes Relative: 15 %
Lymphs Abs: 2.8 10*3/uL (ref 0.7–4.0)
MCH: 30.9 pg (ref 26.0–34.0)
MCHC: 30.5 g/dL (ref 30.0–36.0)
MCV: 101.4 fL — ABNORMAL HIGH (ref 80.0–100.0)
Monocytes Absolute: 1.2 10*3/uL — ABNORMAL HIGH (ref 0.1–1.0)
Monocytes Relative: 7 %
Neutro Abs: 14 10*3/uL — ABNORMAL HIGH (ref 1.7–7.7)
Neutrophils Relative %: 77 %
Platelets: 246 10*3/uL (ref 150–400)
RBC: 4.27 MIL/uL (ref 3.87–5.11)
RDW: 12 % (ref 11.5–15.5)
WBC: 18.2 10*3/uL — ABNORMAL HIGH (ref 4.0–10.5)
nRBC: 0 % (ref 0.0–0.2)

## 2022-07-06 LAB — BLOOD GAS, VENOUS
Acid-Base Excess: 1.6 mmol/L (ref 0.0–2.0)
Bicarbonate: 24.6 mmol/L (ref 20.0–28.0)
Drawn by: 61529
FIO2: 21 %
O2 Saturation: 63 %
Patient temperature: 37.4
pCO2, Ven: 34 mmHg — ABNORMAL LOW (ref 44–60)
pH, Ven: 7.47 — ABNORMAL HIGH (ref 7.25–7.43)
pO2, Ven: 34 mmHg (ref 32–45)

## 2022-07-06 LAB — BASIC METABOLIC PANEL
Anion gap: 16 — ABNORMAL HIGH (ref 5–15)
BUN: 20 mg/dL (ref 6–20)
CO2: 19 mmol/L — ABNORMAL LOW (ref 22–32)
Calcium: 9.2 mg/dL (ref 8.9–10.3)
Chloride: 100 mmol/L (ref 98–111)
Creatinine, Ser: 1.12 mg/dL — ABNORMAL HIGH (ref 0.44–1.00)
GFR, Estimated: 57 mL/min — ABNORMAL LOW (ref >60)
Glucose, Bld: 165 mg/dL — ABNORMAL HIGH (ref 70–99)
Potassium: 3.3 mmol/L — ABNORMAL LOW (ref 3.5–5.1)
Sodium: 135 mmol/L (ref 135–145)

## 2022-07-06 LAB — CBG MONITORING, ED: Glucose-Capillary: 141 mg/dL — ABNORMAL HIGH (ref 70–99)

## 2022-07-06 LAB — LIPASE, BLOOD: Lipase: 28 U/L (ref 11–51)

## 2022-07-06 LAB — TROPONIN I (HIGH SENSITIVITY)
Troponin I (High Sensitivity): 4 ng/L (ref <18)
Troponin I (High Sensitivity): 5 ng/L (ref <18)

## 2022-07-06 MED ORDER — ONDANSETRON HCL 4 MG/2ML IJ SOLN
4.0000 mg | Freq: Once | INTRAMUSCULAR | Status: AC
  Administered 2022-07-06: 4 mg via INTRAVENOUS
  Filled 2022-07-06: qty 2

## 2022-07-06 MED ORDER — PROCHLORPERAZINE EDISYLATE 10 MG/2ML IJ SOLN
10.0000 mg | Freq: Once | INTRAMUSCULAR | Status: AC
  Administered 2022-07-06: 10 mg via INTRAVENOUS
  Filled 2022-07-06: qty 2

## 2022-07-06 MED ORDER — CIPROFLOXACIN IN D5W 400 MG/200ML IV SOLN
400.0000 mg | Freq: Once | INTRAVENOUS | Status: AC
  Administered 2022-07-06: 400 mg via INTRAVENOUS
  Filled 2022-07-06: qty 200

## 2022-07-06 MED ORDER — METOCLOPRAMIDE HCL 5 MG/ML IJ SOLN
10.0000 mg | Freq: Once | INTRAMUSCULAR | Status: AC
  Administered 2022-07-06: 10 mg via INTRAVENOUS
  Filled 2022-07-06: qty 2

## 2022-07-06 MED ORDER — SODIUM CHLORIDE 0.9 % IV BOLUS
2000.0000 mL | Freq: Once | INTRAVENOUS | Status: AC
  Administered 2022-07-06: 2000 mL via INTRAVENOUS

## 2022-07-06 MED ORDER — IOHEXOL 300 MG/ML  SOLN
100.0000 mL | Freq: Once | INTRAMUSCULAR | Status: AC | PRN
  Administered 2022-07-06: 100 mL via INTRAVENOUS

## 2022-07-06 NOTE — ED Triage Notes (Signed)
Pt with emesis for past few hours, pt with c/o generalized weakness.  Pt with hx of diabetes and concerned for DKA.

## 2022-07-06 NOTE — H&P (Signed)
History and Physical    Patient: Caitlin Thompson Caitlin Thompson DOB: 1960/12/10 DOA: 07/06/2022 DOS: the patient was seen and examined on 07/07/2022 PCP: Kerri Perches, MD  Patient coming from: Home  Chief Complaint:  Chief Complaint  Patient presents with   Emesis   HPI: Caitlin Thompson is a 62 y.o. female with medical history significant of hypertension, hyperlipidemia, T2DM who presents to the emergency department due to several episodes of vomiting which started today around 2 PM, she also complained of right flank pain and right lower abdominal pain.  She denies fever, chills, chest pain, shortness of breath.  She decided to go to the ED for further evaluation and management.  ED Course:  In the emergency department, BP was 164/72, respiratory rate 24/minute, other vital signs were within normal range.  Workup in the ED showed normal CBC except for WBC of 18.2 and MCV of 101.4, BMP showed sodium 135, potassium 3.3, chloride 100, bicarb 19, blood glucose 165, BUN 20, creatinine 1.12, EGFR 57, anion gap 16, troponin x 2 was negative lipase 28. Chest x-ray showed no radiographic evidence of acute cardiopulmonary process CT abdomen and pelvis with contrast showed obstructing 10 x 16 x 19 mm calculus within the right ureteropelvic junction resulting in moderate right hydronephrosis and delayed right renal cortical enhancement. Superimposed mild bilateral nonobstructing nephrolithiasis. Urologist (Dr. Marcha Solders) was consulted and recommended admitting patient to [facility] and to consult urology service on arrival to [facility].  Hospitalist was asked to admit patient for further evaluation and management.  Review of Systems: Review of systems as noted in the HPI. All other systems reviewed and are negative.   Past Medical History:  Diagnosis Date   Adenomatous colon polyp 06/26/2011   05/17/11 Colonoscopy Dr Allayne Butcher adenoma Next colonoscopy 05/2016    DERMATOMYCOSIS  05/23/2010   Qualifier: Diagnosis of  By: Lodema Hong MD, Margaret     Diabetes mellitus type II 2007   GERD (gastroesophageal reflux disease) 04/03/2011   Non-critical Schatzki's ring, HH on EGD 05/17/11 Dr Jena Gauss    Hematuria    HYDRONEPHROSIS, LEFT 08/30/2009   Qualifier: Diagnosis of  By: Garnette Czech PA, Dawn     HYPERLIPIDEMIA 12/01/2009   Qualifier: Diagnosis of  By: Garnette Czech PA, Dawn     Hypertension    Obesity (BMI 30.0-34.9) 07/15/2011   Renal lithiasis 6/11   Hospitalized a [facility] 3 days in June    Schatzki's ring    THYROID STIMULATING HORMONE, ABNORMAL 08/30/2009   Qualifier: Diagnosis of  By: Garnette Czech PA, Dawn     Tubular adenoma of colon 3/13   Past Surgical History:  Procedure Laterality Date   COLONOSCOPY N/A 01/29/2019   Procedure: COLONOSCOPY;  Surgeon: Corbin Ade, MD;  Location: [facility];  Service: Endoscopy;  Laterality: N/A;  2:00   COLONOSCOPY W/ POLYPECTOMY  05/17/11   Rourk-Tubular adenoma colon, benign SB bx   ESOPHAGEAL DILATION  05/17/2011   Rourk-Hiatal hernia/Incomplete noncritical Schatzki's ring/HH   POLYPECTOMY  01/29/2019   Procedure: POLYPECTOMY;  Surgeon: Corbin Ade, MD;  Location: [facility];  Service: Endoscopy;;   WISDOM TOOTH EXTRACTION      Social History:  reports that she has never smoked. She has never used smokeless tobacco. She reports that she does not drink alcohol and does not use drugs.   Allergies  Allergen Reactions   Codeine Nausea And Vomiting   Parlodel [Bromocriptine Mesylate]     headache   Penicillins  Hives    Did it involve swelling of the face/tongue/throat, SOB, or low BP? No Did it involve sudden or severe rash/hives, skin peeling, or any reaction on the inside of your mouth or nose? Yes Did you need to seek medical attention at a hospital or doctor's office? Yes When did it last happen?      8 years ago If all above answers are "NO", may proceed with cephalosporin use.    Sulfa Antibiotics Hives, Nausea  And Vomiting and Other (See Comments)   Sulfonamide Derivatives Hives    Family History  Problem Relation Age of Onset   Diabetes Mother    Hypertension Mother    Kidney failure Mother    Diabetes Father    Hypertension Father    Kidney failure Father    Diabetes Brother        x2   Crohn's disease Sister    Aneurysm Sister      Prior to Admission medications   Medication Sig Start Date End Date Taking? Authorizing Provider  amLODipine (NORVASC) 10 MG tablet Take 1 tablet by mouth once daily 10/06/21   Kerri Perches, MD  carvedilol (COREG) 3.125 MG tablet Take 1 tablet (3.125 mg total) by mouth 2 (two) times daily with a meal. 05/19/22   Kerri Perches, MD  glucose blood (ONETOUCH VERIO) test strip USE 1 STRIP TO CHECK GLUCOSE THREE TIMES DAILY 05/17/22   Kerri Perches, MD  insulin NPH-regular Human (70-30) 100 UNIT/ML injection Take 40  units in the morning and take 10 units in the evening 08/05/21   Kerri Perches, MD  nitrofurantoin, macrocrystal-monohydrate, (MACROBID) 100 MG capsule Take 1 capsule (100 mg total) by mouth 2 (two) times daily. 05/24/22   Kerri Perches, MD  OneTouch Delica Lancets 33G MISC Three times daily testing dx e11.65 03/24/20   Kerri Perches, MD  rosuvastatin (CRESTOR) 10 MG tablet Take 1 tablet (10 mg total) by mouth daily. 09/28/20   Kerri Perches, MD  spironolactone (ALDACTONE) 100 MG tablet Take 1 tablet (100 mg total) by mouth daily. 02/17/22   Kerri Perches, MD    Physical Exam: BP (!) 146/81   Pulse (!) 119   Temp (!) 102.3 F (39.1 C)   Resp (!) 24   Ht 5\' 4"  (1.626 m)   Wt 82.6 kg   SpO2 95%   BMI 31.24 kg/m   General: 62 y.o. year-old female illappearing, but in no acute distress.  Alert and oriented x3. HEENT: NCAT, EOMI Neck: Supple, trachea medial Cardiovascular: Regular rate and rhythm with no rubs or gallops.  No thyromegaly or JVD noted.  No lower extremity edema. 2/4 pulses in all 4  extremities. Respiratory: Clear to auscultation with no wheezes or rales. Good inspiratory effort. Abdomen: Soft, mild tenderness to palpation of CVA. Normal bowel sounds x4 quadrants. Muskuloskeletal: No cyanosis, clubbing or edema noted bilaterally Neuro: CN II-XII intact, strength 5/5 x 4, sensation, reflexes intact Skin: No ulcerative lesions noted or rashes Psychiatry: Judgement and insight appear normal. Mood is appropriate for condition and setting          Labs on Admission:  Basic Metabolic Panel: Recent Labs  Lab 07/06/22 1851  NA 135  K 3.3*  CL 100  CO2 19*  GLUCOSE 165*  BUN 20  CREATININE 1.12*  CALCIUM 9.2   Liver Function Tests: No results for input(s): "AST", "ALT", "ALKPHOS", "BILITOT", "PROT", "ALBUMIN" in the last 168 hours. Recent Labs  Lab 07/06/22 1948  LIPASE 28   No results for input(s): "AMMONIA" in the last 168 hours. CBC: Recent Labs  Lab 07/06/22 1851  WBC 18.2*  NEUTROABS 14.0*  HGB 13.2  HCT 43.3  MCV 101.4*  PLT 246   Cardiac Enzymes: No results for input(s): "CKTOTAL", "CKMB", "CKMBINDEX", "TROPONINI" in the last 168 hours.  BNP (last 3 results) No results for input(s): "BNP" in the last 8760 hours.  ProBNP (last 3 results) No results for input(s): "PROBNP" in the last 8760 hours.  CBG: Recent Labs  Lab 07/06/22 1830  GLUCAP 141*    Radiological Exams on Admission: CT ABDOMEN PELVIS W CONTRAST  Result Date: 07/06/2022 CLINICAL DATA:  Abdominal pain, acute, nonlocalized, vomiting EXAM: CT ABDOMEN AND PELVIS WITH CONTRAST TECHNIQUE: Multidetector CT imaging of the abdomen and pelvis was performed using the standard protocol following bolus administration of intravenous contrast. RADIATION DOSE REDUCTION: This exam was performed according to the departmental dose-optimization program which includes automated exposure control, adjustment of the mA and/or kV according to patient size and/or use of iterative reconstruction  technique. CONTRAST:  OMNIPAQUE IOHEXOL 300 MG/ML  SOLN COMPARISON:  08/20/2013 FINDINGS: Lower chest: No acute abnormality.  Small hiatal hernia. Hepatobiliary: Mild hepatomegaly. No enhancing intrahepatic mass. No intra or extrahepatic biliary ductal dilation. Gallbladder unremarkable. Pancreas: Unremarkable Spleen: Normal in size without focal abnormality. Adrenals/Urinary Tract: The adrenal glands are unremarkable. The kidneys are normal in size and position. Moderate right hydronephrosis secondary to an obstructing 10 x 16 x 19 mm calculus within the right ureteropelvic junction. Moderate right perinephric stranding. Delayed right renal cortical enhancement. Superimposed bilateral nonobstructing nephrolithiasis is seen with calculi within the lower pole measuring up to 9 mm on the right and 10 mm on the left. No hydronephrosis on the left. No additional ureteral calculi. The bladder is unremarkable. Stomach/Bowel: Mild scattered colonic diverticulosis. The stomach, small bowel, and large bowel are otherwise unremarkable. Appendix normal. No evidence of obstruction or focal inflammation. No free intraperitoneal gas or fluid. Vascular/Lymphatic: Aortic atherosclerosis. No enlarged abdominal or pelvic lymph nodes. Reproductive: Uterus and bilateral adnexa are unremarkable. Other: No abdominal wall hernia or abnormality. No abdominopelvic ascites. Musculoskeletal: Degenerative changes are seen within the lumbar spine. No acute bone abnormality. No lytic or blastic bone lesion. IMPRESSION: 1. Obstructing 10 x 16 x 19 mm calculus within the right ureteropelvic junction resulting in moderate right hydronephrosis and delayed right renal cortical enhancement. Superimposed mild bilateral nonobstructing nephrolithiasis. Aortic Atherosclerosis (ICD10-I70.0). Electronically Signed   By: Helyn Numbers M.D.   On: 07/06/2022 22:08   DG Chest 1 View  Result Date: 07/06/2022 CLINICAL DATA:  Shortness of breath, nausea  EXAM: CHEST  1 VIEW COMPARISON:  Chest radiograph 03/05/2018 FINDINGS: The cardiomediastinal silhouette is normal There is no focal consolidation or pulmonary edema. There is no pleural effusion or pneumothorax There is no acute osseous abnormality. IMPRESSION: No radiographic evidence of acute cardiopulmonary process. Electronically Signed   By: Lesia Hausen M.D.   On: 07/06/2022 19:25    EKG: I independently viewed the EKG done and my findings are as followed:   normal sinus rhythm at a rate of 82 bpm with nonspecific intraventricular conduction block QTc 516 ms (similar to EKG done on 03/05/2018)   Assessment/Plan Present on Admission:  Acute pyelonephritis  Type 2 diabetes mellitus with hyperglycemia (HCC)  Essential hypertension  Hyperlipidemia LDL goal <100  Principal Problem:   Sepsis secondary to UTI [facility]) Active Problems:   Hyperlipidemia LDL  goal <100   Essential hypertension   Acute pyelonephritis   Type 2 diabetes mellitus with hyperglycemia (HCC)   Nephrolithiasis   Hypokalemia   Prolonged QT interval   Elevated MCV  Sepsis secondary to acute pyelonephritis While in the ED, patient became febrile with a temperature of 102.36F, she was tachypneic and tachycardic.  WBC was elevated at 18.2 in the setting of obstructive calculus within the right ureteropelvic junction with subsequent moderate right hydronephrosis which appears to be infected at this time. Patient was already empirically treated with IV ciprofloxacin Urine culture done on 09/27/2020 was positive for E. coli which was sensitive to ciprofloxacin, we shall continue with IV ciprofloxacin at this time Continue Tylenol as needed Urinalysis, urine culture and blood culture pending  Nephrolithiasis CT abdomen and pelvis showed obstructive calculus within the right ureteropelvic junction resulting in moderate right hydronephrosis and delayed right renal cortical enhancement.  Continue IV hydration Continue IV Dilaudid  as needed Nephrologist (Dr. Marcha Solders) was already consulted and recommended admitting the patient to [facility] and to notify urology team on arrival to [facility].  Hypokalemia K+ 3.3, this will be replenished  Prolonged QT interval QTc Avoid QT prolonging drugs Potassium will be replenished Magnesium level will be checked Repeat EKG in the morning  T2DM with hyperglycemia Hemoglobin A1c at 6.5 on 05/19/2022 Continue ISS and hypoglycemic protocol  Elevated MCV MCV 101.4.  Vitamin B12 and folate levels will be checked  Essential hypertension Continue IV hydralazine 10 mg every 6 hours as needed for SBP > 170 Continue home meds when patient resumes oral intake  Mixed hyperlipidemia Consider starting patient's home Crestor when she resumes oral intake  DVT prophylaxis: SCDs   Advance Care Planning: Full code  Consults: Urology (by [facility] EDP)  Family Communication: Daughter at bedside (all questions answered to satisfaction)  Severity of Illness: The appropriate patient status for this patient is INPATIENT. Inpatient status is judged to be reasonable and necessary in order to provide the required intensity of service to ensure the patient's safety. The patient's presenting symptoms, physical exam findings, and initial radiographic and laboratory data in the context of their chronic comorbidities is felt to place them at high risk for further clinical deterioration. Furthermore, it is not anticipated that the patient will be medically stable for discharge from the hospital within 2 midnights of admission.   * I certify that at the point of admission it is my clinical judgment that the patient will require inpatient hospital care spanning beyond 2 midnights from the point of admission due to high intensity of service, high risk for further deterioration and high frequency of surveillance required.*  Author: Frankey Shown, DO 07/07/2022 2:22 AM  For on call review  [email or web address].

## 2022-07-07 ENCOUNTER — Inpatient Hospital Stay (HOSPITAL_COMMUNITY): Payer: BC Managed Care – PPO

## 2022-07-07 DIAGNOSIS — E876 Hypokalemia: Secondary | ICD-10-CM | POA: Insufficient documentation

## 2022-07-07 DIAGNOSIS — N390 Urinary tract infection, site not specified: Secondary | ICD-10-CM | POA: Diagnosis not present

## 2022-07-07 DIAGNOSIS — R9431 Abnormal electrocardiogram [ECG] [EKG]: Secondary | ICD-10-CM | POA: Insufficient documentation

## 2022-07-07 DIAGNOSIS — R718 Other abnormality of red blood cells: Secondary | ICD-10-CM | POA: Insufficient documentation

## 2022-07-07 DIAGNOSIS — A419 Sepsis, unspecified organism: Secondary | ICD-10-CM | POA: Diagnosis not present

## 2022-07-07 DIAGNOSIS — R6520 Severe sepsis without septic shock: Secondary | ICD-10-CM | POA: Diagnosis present

## 2022-07-07 HISTORY — PX: IR NEPHROSTOMY PLACEMENT RIGHT: IMG6064

## 2022-07-07 LAB — URINALYSIS, ROUTINE W REFLEX MICROSCOPIC
Bacteria, UA: NONE SEEN
Bilirubin Urine: NEGATIVE
Glucose, UA: 50 mg/dL — AB
Ketones, ur: 5 mg/dL — AB
Leukocytes,Ua: NEGATIVE
Nitrite: NEGATIVE
Protein, ur: 30 mg/dL — AB
Specific Gravity, Urine: 1.023 (ref 1.005–1.030)
pH: 5 (ref 5.0–8.0)

## 2022-07-07 LAB — URINALYSIS, W/ REFLEX TO CULTURE (INFECTION SUSPECTED)
RBC / HPF: >50 RBC/hpf (ref 0–5)
WBC, UA: >50 WBC/hpf (ref 0–5)

## 2022-07-07 LAB — CBC
HCT: 36.7 % (ref 36.0–46.0)
Hemoglobin: 11.5 g/dL — ABNORMAL LOW (ref 12.0–15.0)
MCH: 30.7 pg (ref 26.0–34.0)
MCHC: 31.3 g/dL (ref 30.0–36.0)
MCV: 97.9 fL (ref 80.0–100.0)
Platelets: 177 10*3/uL (ref 150–400)
RBC: 3.75 MIL/uL — ABNORMAL LOW (ref 3.87–5.11)
RDW: 12.5 % (ref 11.5–15.5)
WBC: 17 10*3/uL — ABNORMAL HIGH (ref 4.0–10.5)
nRBC: 0.1 % (ref 0.0–0.2)

## 2022-07-07 LAB — BASIC METABOLIC PANEL
Anion gap: 16 — ABNORMAL HIGH (ref 5–15)
BUN: 22 mg/dL — ABNORMAL HIGH (ref 6–20)
CO2: 20 mmol/L — ABNORMAL LOW (ref 22–32)
Calcium: 7.9 mg/dL — ABNORMAL LOW (ref 8.9–10.3)
Chloride: 101 mmol/L (ref 98–111)
Creatinine, Ser: 2.1 mg/dL — ABNORMAL HIGH (ref 0.44–1.00)
GFR, Estimated: 27 mL/min — ABNORMAL LOW (ref >60)
Glucose, Bld: 163 mg/dL — ABNORMAL HIGH (ref 70–99)
Potassium: 3.1 mmol/L — ABNORMAL LOW (ref 3.5–5.1)
Sodium: 137 mmol/L (ref 135–145)

## 2022-07-07 LAB — CBG MONITORING, ED
Glucose-Capillary: 177 mg/dL — ABNORMAL HIGH (ref 70–99)
Glucose-Capillary: 130 mg/dL — ABNORMAL HIGH (ref 70–99)
Glucose-Capillary: 187 mg/dL — ABNORMAL HIGH (ref 70–99)
Glucose-Capillary: 162 mg/dL — ABNORMAL HIGH (ref 70–99)

## 2022-07-07 LAB — RESP PANEL BY RT-PCR (RSV, FLU A&B, COVID)  RVPGX2
Influenza A by PCR: NEGATIVE
Influenza B by PCR: NEGATIVE
Resp Syncytial Virus by PCR: NEGATIVE
SARS Coronavirus 2 by RT PCR: NEGATIVE

## 2022-07-07 LAB — URINE CULTURE

## 2022-07-07 LAB — PROTIME-INR
INR: 1.2 (ref 0.8–1.2)
Prothrombin Time: 14.6 seconds (ref 11.4–15.2)

## 2022-07-07 LAB — GLUCOSE, CAPILLARY: Glucose-Capillary: 178 mg/dL — ABNORMAL HIGH (ref 70–99)

## 2022-07-07 LAB — FOLATE: Folate: 7.9 ng/mL (ref >5.9)

## 2022-07-07 LAB — CULTURE, BLOOD (ROUTINE X 2)

## 2022-07-07 LAB — VITAMIN B12: Vitamin B-12: 349 pg/mL (ref 180–914)

## 2022-07-07 LAB — HIV ANTIBODY (ROUTINE TESTING W REFLEX): HIV Screen 4th Generation wRfx: NONREACTIVE

## 2022-07-07 LAB — MAGNESIUM: Magnesium: 1.1 mg/dL — ABNORMAL LOW (ref 1.7–2.4)

## 2022-07-07 MED ORDER — MAGNESIUM SULFATE 4 GM/100ML IV SOLN
4.0000 g | Freq: Once | INTRAVENOUS | Status: AC
  Administered 2022-07-07: 4 g via INTRAVENOUS
  Filled 2022-07-07 (×2): qty 100

## 2022-07-07 MED ORDER — SODIUM CHLORIDE 0.9 % IV SOLN
2.0000 g | Freq: Two times a day (BID) | INTRAVENOUS | Status: DC
  Administered 2022-07-07 – 2022-07-10 (×7): 2 g via INTRAVENOUS
  Filled 2022-07-07 (×7): qty 12.5

## 2022-07-07 MED ORDER — VANCOMYCIN HCL IN DEXTROSE 1-5 GM/200ML-% IV SOLN: 1000.0000 mg | INTRAVENOUS | Status: DC

## 2022-07-07 MED ORDER — MIDAZOLAM HCL 2 MG/2ML IJ SOLN
INTRAMUSCULAR | Status: AC | PRN
  Administered 2022-07-07 (×2): 1 mg via INTRAVENOUS

## 2022-07-07 MED ORDER — LIDOCAINE HCL 1 % IJ SOLN
20.0000 mL | Freq: Once | INTRAMUSCULAR | Status: AC
  Administered 2022-07-07: 5 mL via INTRADERMAL

## 2022-07-07 MED ORDER — VANCOMYCIN HCL 1250 MG/250ML IV SOLN: 1250.0000 mg | INTRAVENOUS | Status: DC

## 2022-07-07 MED ORDER — SODIUM CHLORIDE 0.9 % IV SOLN: INTRAVENOUS | Status: AC

## 2022-07-07 MED ORDER — IOHEXOL 300 MG/ML  SOLN
50.0000 mL | Freq: Once | INTRAMUSCULAR | Status: AC | PRN
  Administered 2022-07-07: 10 mL

## 2022-07-07 MED ORDER — VANCOMYCIN HCL IN DEXTROSE 1-5 GM/200ML-% IV SOLN
1000.0000 mg | Freq: Once | INTRAVENOUS | Status: AC
  Administered 2022-07-07: 1000 mg via INTRAVENOUS
  Filled 2022-07-07: qty 200

## 2022-07-07 MED ORDER — FENTANYL CITRATE (PF) 100 MCG/2ML IJ SOLN
INTRAMUSCULAR | Status: AC
  Filled 2022-07-07: qty 2

## 2022-07-07 MED ORDER — POTASSIUM CHLORIDE 10 MEQ/100ML IV SOLN
10.0000 meq | INTRAVENOUS | Status: AC
  Administered 2022-07-07 (×3): 10 meq via INTRAVENOUS
  Filled 2022-07-07 (×3): qty 100

## 2022-07-07 MED ORDER — HYDROMORPHONE HCL 1 MG/ML IJ SOLN
0.5000 mg | INTRAMUSCULAR | Status: DC | PRN
  Administered 2022-07-07 – 2022-07-10 (×8): 0.5 mg via INTRAVENOUS
  Filled 2022-07-07 (×8): qty 0.5

## 2022-07-07 MED ORDER — ACETAMINOPHEN 650 MG RE SUPP
650.0000 mg | Freq: Four times a day (QID) | RECTAL | Status: DC | PRN
  Administered 2022-07-07: 650 mg via RECTAL
  Filled 2022-07-07: qty 1

## 2022-07-07 MED ORDER — CIPROFLOXACIN IN D5W 400 MG/200ML IV SOLN
400.0000 mg | Freq: Two times a day (BID) | INTRAVENOUS | Status: DC
  Administered 2022-07-07: 400 mg via INTRAVENOUS
  Filled 2022-07-07: qty 200

## 2022-07-07 MED ORDER — MIDAZOLAM HCL 2 MG/2ML IJ SOLN
INTRAMUSCULAR | Status: AC
  Filled 2022-07-07: qty 2

## 2022-07-07 MED ORDER — SODIUM CHLORIDE 0.9 % IV SOLN: INTRAVENOUS | Status: DC

## 2022-07-07 MED ORDER — POTASSIUM CHLORIDE 10 MEQ/100ML IV SOLN
10.0000 meq | INTRAVENOUS | Status: AC
  Administered 2022-07-07 (×5): 10 meq via INTRAVENOUS
  Filled 2022-07-07 (×5): qty 100

## 2022-07-07 MED ORDER — FENTANYL CITRATE (PF) 100 MCG/2ML IJ SOLN
INTRAMUSCULAR | Status: AC | PRN
  Administered 2022-07-07 (×2): 50 ug via INTRAVENOUS

## 2022-07-07 MED ORDER — PROCHLORPERAZINE EDISYLATE 10 MG/2ML IJ SOLN
10.0000 mg | Freq: Four times a day (QID) | INTRAMUSCULAR | Status: DC | PRN
  Administered 2022-07-07 (×2): 10 mg via INTRAVENOUS
  Filled 2022-07-07 (×2): qty 2

## 2022-07-07 MED ORDER — PANTOPRAZOLE SODIUM 40 MG IV SOLR
40.0000 mg | Freq: Two times a day (BID) | INTRAVENOUS | Status: DC
  Administered 2022-07-07 – 2022-07-08 (×3): 40 mg via INTRAVENOUS
  Filled 2022-07-07 (×3): qty 10

## 2022-07-07 MED ORDER — ACETAMINOPHEN 325 MG PO TABS
650.0000 mg | ORAL_TABLET | Freq: Four times a day (QID) | ORAL | Status: DC | PRN
  Administered 2022-07-08 – 2022-07-10 (×4): 650 mg via ORAL
  Filled 2022-07-07 (×4): qty 2

## 2022-07-07 MED ORDER — SODIUM CHLORIDE 0.9% FLUSH
5.0000 mL | Freq: Three times a day (TID) | INTRAVENOUS | Status: DC
  Administered 2022-07-07 – 2022-07-11 (×10): 5 mL

## 2022-07-07 MED ORDER — INSULIN ASPART 100 UNIT/ML IJ SOLN
0.0000 [IU] | INTRAMUSCULAR | Status: DC
  Administered 2022-07-07: 3 [IU] via SUBCUTANEOUS
  Administered 2022-07-07: 2 [IU] via SUBCUTANEOUS
  Administered 2022-07-07 (×3): 3 [IU] via SUBCUTANEOUS
  Administered 2022-07-08: 2 [IU] via SUBCUTANEOUS
  Administered 2022-07-08: 3 [IU] via SUBCUTANEOUS
  Administered 2022-07-08: 2 [IU] via SUBCUTANEOUS
  Administered 2022-07-08 (×3): 3 [IU] via SUBCUTANEOUS
  Administered 2022-07-09 (×3): 2 [IU] via SUBCUTANEOUS
  Administered 2022-07-09: 3 [IU] via SUBCUTANEOUS
  Administered 2022-07-10 (×2): 2 [IU] via SUBCUTANEOUS
  Administered 2022-07-10: 5 [IU] via SUBCUTANEOUS
  Filled 2022-07-07: qty 0.15

## 2022-07-07 MED ORDER — HYDRALAZINE HCL 20 MG/ML IJ SOLN: 10.0000 mg | Freq: Four times a day (QID) | INTRAMUSCULAR | Status: DC | PRN

## 2022-07-07 NOTE — Procedures (Signed)
Interventional Radiology Procedure Note  Procedure: Right percutaneous nephrostomy tube placement  Complications: None  Estimated Blood Loss: < 10 mL  Findings: Significant right hydronephrosis with persistent contrast in collecting system due to obstructing pelvic calculus.  10 Fr PCN placed via interpolar access. Draining blood tinged urine. Sample sent for culture.   PCN will be left to gravity bag drainage.  Jodi Marble. Fredia Sorrow, M.D Pager:  [phone]

## 2022-07-07 NOTE — ED Notes (Signed)
Projectile emesis, dark brown large amount physician notified

## 2022-07-07 NOTE — ED Notes (Signed)
Dr. Margo Aye with urology at the bedside to consult pt

## 2022-07-07 NOTE — Progress Notes (Signed)
Pharmacy Antibiotic Note  Caitlin Thompson is a 62 y.o. female admitted on 07/06/2022 with pmhx significant for IDDM, HTN, and hyperlipidemia and well as prior h/o nephrolithiasis who is transferred from [facility] and admitted to medical service with urosepsis and 2cm right UPJ stone.   Pharmacy has been consulted to dose vancomycin and cefepime.  5/3 08:40 SCr up 2.10 and est CrCl 30 ml/min   Plan: Change vancomycin to 1000 mg IV every 48 hours (Goal AUC 400-550, eAUC 482.8, SCr used: 2.1) Continue Cefepime 2gm IV q12h for CrCl 30-60 ml/min Follow renal function, cultures and clinical course  Height: 5\' 4"  (162.6 cm) Weight: 82.6 kg (182 lb) IBW/kg (Calculated) : 54.7  Temp (24hrs), Avg:100.5 F (38.1 C), Min:99.3 F (37.4 C), Max:102.3 F (39.1 C)  Recent Labs  Lab 07/06/22 1851 07/07/22 0840  WBC 18.2* 17.0*  CREATININE 1.12* 2.10*     Estimated Creatinine Clearance: 30 mL/min (A) (by C-G formula based on SCr of 2.1 mg/dL (H)).    Allergies  Allergen Reactions   Codeine Nausea And Vomiting   Parlodel [Bromocriptine Mesylate]     headache   Penicillins Hives   Sulfa Antibiotics Hives, Nausea And Vomiting and Other (See Comments)   Sulfonamide Derivatives Hives    Antimicrobials this admission: 5/2 cipro >> 5/3 5/2 vanc >> 5/2 cefepime >>  Dose adjustments this admission: 5/3 change vancomycin from 1250 mg q36h to 1000 mg q48h  Microbiology results: 5/3 BCx: pending  Thank you for allowing pharmacy to be a part of this patient's care.  Selinda Eon, PharmD, BCPS Clinical Pharmacist [place] Please utilize Amion for appropriate phone number to reach the unit pharmacist [facility]) 07/07/2022 10:09 AM

## 2022-07-07 NOTE — Progress Notes (Signed)
TRH Update:  I was updated by EDP, Dr. Tanda Rockers, that he had touched base with on-call urology (Dr. Margo Aye), who is going to contact IR to do a percutaneous drain, as Dr. Margo Aye feels that the stone is too large for ureteral stenting.  Dr. Also recommends broadening spectrum of IV antibiotics, with IV Vanc and Zosyn now ordered.    Caitlin Pigg, DO Hospitalist

## 2022-07-07 NOTE — Progress Notes (Signed)
   07/07/22 1621  Assess: MEWS Score  Temp 99.2 F (37.3 C)  BP (!) 142/78  MAP (mmHg) 98  Pulse Rate (!) 111  Resp 18  Level of Consciousness Alert  SpO2 100 %  O2 Device Room Air  Assess: MEWS Score  MEWS Temp 0  MEWS Systolic 0  MEWS Pulse 2  MEWS RR 0  MEWS LOC 0  MEWS Score 2  MEWS Score Color Yellow  Assess: if the MEWS score is Yellow or Red  Were vital signs taken at a resting state? Yes  Focused Assessment Change from prior assessment (see assessment flowsheet)  Does the patient meet 2 or more of the SIRS criteria? Yes  Does the patient have a confirmed or suspected source of infection? Yes  MEWS guidelines implemented  Yes, yellow  Treat  MEWS Interventions Considered administering scheduled or prn medications/treatments as ordered  Take Vital Signs  Increase Vital Sign Frequency  Yellow: Q2hr x1, continue Q4hrs until patient remains green for 12hrs  Escalate  MEWS: Escalate Yellow: Discuss with charge nurse and consider notifying provider and/or RRT  Notify: Charge Nurse/RN  Name of Charge Nurse/RN Notified [facility]  Provider Notification  Provider Name/Title Dr. Renford Dills  Date Provider Notified 07/07/22  Time Provider Notified 1640  Method of Notification Page  Notification Reason Other (Comment) (Yellow MEWS)  Provider response No new orders  Date of Provider Response 07/07/22 (1650)  Notify: Rapid Response  Name of Rapid Response RN Notified Deborah, RN  Date Rapid Response Notified 07/07/22  Time Rapid Response Notified 1800  Assess: SIRS CRITERIA  SIRS Temperature  0  SIRS Pulse 1  SIRS Respirations  0  SIRS WBC 1  SIRS Score Sum  2

## 2022-07-07 NOTE — ED Notes (Signed)
Pt received via [facility] as [facility] ED transfer, presents to [facility] ED with large obstructing stone in R ureter with hydronephrosis. Per [facility], pt has remained tachy in 120-130's throughout ride. Arrives a&ox3. Urology and nephrology consult placed for hospital admission.

## 2022-07-07 NOTE — ED Notes (Signed)
Purewick removed pt unable to use. Pt has a brief and will continue to check pt.

## 2022-07-07 NOTE — Consult Note (Addendum)
Urology Consult   Physician requesting consult: Wallace Cullens  Reason for consult: urosepsis and 2cm right upj stone  History of Present Illness: Caitlin Thompson is a 63 y.o. female with pmhx significant for IDDM, HTN, and hyperlipidemia and well as prior h/o nephrolithiasis who is transferred from [facility] and admitted to medical service with urosepsis and 2cm right UPJ stone.  Patient presented with acute right sided pain and n/v.  Was tachy, and febrile to 102.7.  Prominent leukocytosis of 18K.  Cr 1.12   Past Medical History:  Diagnosis Date   Adenomatous colon polyp 06/26/2011   05/17/11 Colonoscopy Dr Allayne Butcher adenoma Next colonoscopy 05/2016    DERMATOMYCOSIS 05/23/2010   Qualifier: Diagnosis of  By: Lodema Hong MD, Margaret     Diabetes mellitus type II 2007   GERD (gastroesophageal reflux disease) 04/03/2011   Non-critical Schatzki's ring, HH on EGD 05/17/11 Dr Jena Gauss    Hematuria    HYDRONEPHROSIS, LEFT 08/30/2009   Qualifier: Diagnosis of  By: Garnette Czech PA, Dawn     HYPERLIPIDEMIA 12/01/2009   Qualifier: Diagnosis of  By: Garnette Czech PA, Dawn     Hypertension    Obesity (BMI 30.0-34.9) 07/15/2011   Renal lithiasis 6/11   Hospitalized a [facility] 3 days in June    Schatzki's ring    THYROID STIMULATING HORMONE, ABNORMAL 08/30/2009   Qualifier: Diagnosis of  By: Garnette Czech PA, Dawn     Tubular adenoma of colon 3/13    Past Surgical History:  Procedure Laterality Date   COLONOSCOPY N/A 01/29/2019   Procedure: COLONOSCOPY;  Surgeon: Corbin Ade, MD;  Location: [facility];  Service: Endoscopy;  Laterality: N/A;  2:00   COLONOSCOPY W/ POLYPECTOMY  05/17/11   Rourk-Tubular adenoma colon, benign SB bx   ESOPHAGEAL DILATION  05/17/2011   Rourk-Hiatal hernia/Incomplete noncritical Schatzki's ring/HH   POLYPECTOMY  01/29/2019   Procedure: POLYPECTOMY;  Surgeon: Corbin Ade, MD;  Location: [facility];  Service: Endoscopy;;   WISDOM TOOTH EXTRACTION      Medications:  Home  meds:    Scheduled Meds:  insulin aspart  0-15 Units Subcutaneous Q4H   Continuous Infusions:  sodium chloride 100 mL/hr at 07/07/22 0141   ciprofloxacin     PRN Meds:.acetaminophen **OR** acetaminophen, hydrALAZINE, HYDROmorphone (DILAUDID) injection, prochlorperazine  Allergies:  Allergies  Allergen Reactions   Codeine Nausea And Vomiting   Parlodel [Bromocriptine Mesylate]     headache   Penicillins Hives    Did it involve swelling of the face/tongue/throat, SOB, or low BP? No Did it involve sudden or severe rash/hives, skin peeling, or any reaction on the inside of your mouth or nose? Yes Did you need to seek medical attention at a hospital or doctor's office? Yes When did it last happen?      8 years ago If all above answers are "NO", may proceed with cephalosporin use.    Sulfa Antibiotics Hives, Nausea And Vomiting and Other (See Comments)   Sulfonamide Derivatives Hives    Family History  Problem Relation Age of Onset   Diabetes Mother    Hypertension Mother    Kidney failure Mother    Diabetes Father    Hypertension Father    Kidney failure Father    Diabetes Brother        x2   Crohn's disease Sister    Aneurysm Sister     Social History:  reports that she has never smoked. She has never used smokeless tobacco. She reports  that she does not drink alcohol and does not use drugs.  ROS: A complete review of systems was performed.  All systems are negative except for pertinent findings as noted.  Physical Exam:  Vital signs in last 24 hours: Temp:  [99.3 F (37.4 C)-102.3 F (39.1 C)] 100.7 F (38.2 C) (05/03 0516) Pulse Rate:  [76-133] 118 (05/03 0340) Resp:  [10-37] 29 (05/03 0340) BP: (146-189)/(72-93) 158/77 (05/03 0340) SpO2:  [92 %-100 %] 97 % (05/03 0340) Weight:  [82.6 kg] 82.6 kg (05/02 1831) Constitutional:  Alert and oriented, No acute distress   Laboratory Data:  Recent Labs    07/06/22 1851  WBC 18.2*  HGB 13.2  HCT 43.3  PLT 246     Recent Labs    07/06/22 1851  NA 135  K 3.3*  CL 100  GLUCOSE 165*  BUN 20  CALCIUM 9.2  CREATININE 1.12*     Results for orders placed or performed during the hospital encounter of 07/06/22 (from the past 24 hour(s))  CBG monitoring, ED     Status: Abnormal   Collection Time: 07/06/22  6:30 PM  Result Value Ref Range   Glucose-Capillary 141 (H) 70 - 99 mg/dL  CBC with Differential     Status: Abnormal   Collection Time: 07/06/22  6:51 PM  Result Value Ref Range   WBC 18.2 (H) 4.0 - 10.5 K/uL   RBC 4.27 3.87 - 5.11 MIL/uL   Hemoglobin 13.2 12.0 - 15.0 g/dL   HCT 16.1 09.6 - 04.5 %   MCV 101.4 (H) 80.0 - 100.0 fL   MCH 30.9 26.0 - 34.0 pg   MCHC 30.5 30.0 - 36.0 g/dL   RDW 40.9 81.1 - 91.4 %   Platelets 246 150 - 400 K/uL   nRBC 0.0 0.0 - 0.2 %   Neutrophils Relative % 77 %   Neutro Abs 14.0 (H) 1.7 - 7.7 K/uL   Lymphocytes Relative 15 %   Lymphs Abs 2.8 0.7 - 4.0 K/uL   Monocytes Relative 7 %   Monocytes Absolute 1.2 (H) 0.1 - 1.0 K/uL   Eosinophils Relative 0 %   Eosinophils Absolute 0.1 0.0 - 0.5 K/uL   Basophils Relative 0 %   Basophils Absolute 0.1 0.0 - 0.1 K/uL   Immature Granulocytes 1 %   Abs Immature Granulocytes 0.09 (H) 0.00 - 0.07 K/uL  Basic metabolic panel     Status: Abnormal   Collection Time: 07/06/22  6:51 PM  Result Value Ref Range   Sodium 135 135 - 145 mmol/L   Potassium 3.3 (L) 3.5 - 5.1 mmol/L   Chloride 100 98 - 111 mmol/L   CO2 19 (L) 22 - 32 mmol/L   Glucose, Bld 165 (H) 70 - 99 mg/dL   BUN 20 6 - 20 mg/dL   Creatinine, Ser 7.82 (H) 0.44 - 1.00 mg/dL   Calcium 9.2 8.9 - 95.6 mg/dL   GFR, Estimated 57 (L) >60 mL/min   Anion gap 16 (H) 5 - 15  Lipase, blood     Status: None   Collection Time: 07/06/22  7:48 PM  Result Value Ref Range   Lipase 28 11 - 51 U/L  Troponin I (High Sensitivity)     Status: None   Collection Time: 07/06/22  7:48 PM  Result Value Ref Range   Troponin I (High Sensitivity) 4 <18 ng/L  Blood gas,  venous (at [facility] and [facility])     Status: Abnormal   Collection Time:  07/06/22  8:44 PM  Result Value Ref Range   FIO2 21.0 %   pH, Ven 7.47 (H) 7.25 - 7.43   pCO2, Ven 34 (L) 44 - 60 mmHg   pO2, Ven 34 32 - 45 mmHg   Bicarbonate 24.6 20.0 - 28.0 mmol/L   Acid-Base Excess 1.6 0.0 - 2.0 mmol/L   O2 Saturation 63 %   Patient temperature 37.4    Collection site RIGHT ANTECUBITAL    Drawn by [phone]   Troponin I (High Sensitivity)     Status: None   Collection Time: 07/06/22 10:33 PM  Result Value Ref Range   Troponin I (High Sensitivity) 5 <18 ng/L  Urinalysis, Routine w reflex microscopic -Urine, Clean Catch     Status: Abnormal   Collection Time: 07/07/22  2:20 AM  Result Value Ref Range   Color, Urine STRAW (A) YELLOW   APPearance CLEAR CLEAR   Specific Gravity, Urine 1.023 1.005 - 1.030   pH 5.0 5.0 - 8.0   Glucose, UA 50 (A) NEGATIVE mg/dL   Hgb urine dipstick SMALL (A) NEGATIVE   Bilirubin Urine NEGATIVE NEGATIVE   Ketones, ur 5 (A) NEGATIVE mg/dL   Protein, ur 30 (A) NEGATIVE mg/dL   Nitrite NEGATIVE NEGATIVE   Leukocytes,Ua NEGATIVE NEGATIVE   RBC / HPF 0-5 0 - 5 RBC/hpf   WBC, UA 6-10 0 - 5 WBC/hpf   Bacteria, UA NONE SEEN NONE SEEN   Squamous Epithelial / HPF 0-5 0 - 5 /HPF  Vitamin B12     Status: None   Collection Time: 07/07/22  2:47 AM  Result Value Ref Range   Vitamin B-12 349 180 - 914 pg/mL  Culture, blood (Routine X 2) w Reflex to ID Panel     Status: None (Preliminary result)   Collection Time: 07/07/22  2:47 AM   Specimen: BLOOD  Result Value Ref Range   Specimen Description BLOOD BLOOD RIGHT HAND    Special Requests      BOTTLES DRAWN AEROBIC AND ANAEROBIC Blood Culture adequate volume Performed at [facility], [address], [place], [place] [address]    Culture PENDING    Report Status PENDING   Culture, blood (Routine X 2) w Reflex to ID Panel     Status: None (Preliminary result)   Collection Time: 07/07/22  2:49 AM   Specimen: BLOOD  Result Value Ref  Range   Specimen Description BLOOD BLOOD RIGHT WRIST    Special Requests      BOTTLES DRAWN AEROBIC AND ANAEROBIC Blood Culture adequate volume Performed at [facility], [address] [place], [place] [address]    Culture PENDING    Report Status PENDING   CBG monitoring, ED     Status: Abnormal   Collection Time: 07/07/22  5:28 AM  Result Value Ref Range   Glucose-Capillary 177 (H) 70 - 99 mg/dL   Recent Results (from the past 240 hour(s))  Culture, blood (Routine X 2) w Reflex to ID Panel     Status: None (Preliminary result)   Collection Time: 07/07/22  2:47 AM   Specimen: BLOOD  Result Value Ref Range Status   Specimen Description BLOOD BLOOD RIGHT HAND  Final   Special Requests   Final    BOTTLES DRAWN AEROBIC AND ANAEROBIC Blood Culture adequate volume Performed at [facility], [address], [place], [place] [address]    Culture PENDING  Incomplete   Report Status PENDING  Incomplete  Culture, blood (Routine X 2)  w Reflex to ID Panel     Status: None (Preliminary result)   Collection Time: 07/07/22  2:49 AM   Specimen: BLOOD  Result Value Ref Range Status   Specimen Description BLOOD BLOOD RIGHT WRIST  Final   Special Requests   Final    BOTTLES DRAWN AEROBIC AND ANAEROBIC Blood Culture adequate volume Performed at [facility], [address] [place], [place] [address]    Culture PENDING  Incomplete   Report Status PENDING  Incomplete    Renal Function: Recent Labs    07/06/22 1851  CREATININE 1.12*   Estimated Creatinine Clearance: 56.3 mL/min (A) (by C-G formula based on SCr of 1.12 mg/dL (H)).  Radiologic Imaging: CT ABDOMEN PELVIS W CONTRAST  Result Date: 07/06/2022 CLINICAL DATA:  Abdominal pain, acute, nonlocalized, vomiting EXAM: CT ABDOMEN AND PELVIS WITH CONTRAST TECHNIQUE: Multidetector CT imaging of the abdomen and pelvis was performed using the standard protocol following bolus administration of intravenous contrast. RADIATION DOSE REDUCTION:  This exam was performed according to the departmental dose-optimization program which includes automated exposure control, adjustment of the mA and/or kV according to patient size and/or use of iterative reconstruction technique. CONTRAST:  OMNIPAQUE IOHEXOL 300 MG/ML  SOLN COMPARISON:  08/20/2013 FINDINGS: Lower chest: No acute abnormality.  Small hiatal hernia. Hepatobiliary: Mild hepatomegaly. No enhancing intrahepatic mass. No intra or extrahepatic biliary ductal dilation. Gallbladder unremarkable. Pancreas: Unremarkable Spleen: Normal in size without focal abnormality. Adrenals/Urinary Tract: The adrenal glands are unremarkable. The kidneys are normal in size and position. Moderate right hydronephrosis secondary to an obstructing 10 x 16 x 19 mm calculus within the right ureteropelvic junction. Moderate right perinephric stranding. Delayed right renal cortical enhancement. Superimposed bilateral nonobstructing nephrolithiasis is seen with calculi within the lower pole measuring up to 9 mm on the right and 10 mm on the left. No hydronephrosis on the left. No additional ureteral calculi. The bladder is unremarkable. Stomach/Bowel: Mild scattered colonic diverticulosis. The stomach, small bowel, and large bowel are otherwise unremarkable. Appendix normal. No evidence of obstruction or focal inflammation. No free intraperitoneal gas or fluid. Vascular/Lymphatic: Aortic atherosclerosis. No enlarged abdominal or pelvic lymph nodes. Reproductive: Uterus and bilateral adnexa are unremarkable. Other: No abdominal wall hernia or abnormality. No abdominopelvic ascites. Musculoskeletal: Degenerative changes are seen within the lumbar spine. No acute bone abnormality. No lytic or blastic bone lesion. IMPRESSION: 1. Obstructing 10 x 16 x 19 mm calculus within the right ureteropelvic junction resulting in moderate right hydronephrosis and delayed right renal cortical enhancement. Superimposed mild bilateral  nonobstructing nephrolithiasis. Aortic Atherosclerosis (ICD10-I70.0). Electronically Signed   By: Helyn Numbers M.D.   On: 07/06/2022 22:08   DG Chest 1 View  Result Date: 07/06/2022 CLINICAL DATA:  Shortness of breath, nausea EXAM: CHEST  1 VIEW COMPARISON:  Chest radiograph 03/05/2018 FINDINGS: The cardiomediastinal silhouette is normal There is no focal consolidation or pulmonary edema. There is no pleural effusion or pneumothorax There is no acute osseous abnormality. IMPRESSION: No radiographic evidence of acute cardiopulmonary process. Electronically Signed   By: Lesia Hausen M.D.   On: 07/06/2022 19:25    I independently reviewed the above imaging studies.  Impression/Recommendation Urosepsis with 2cm right upj stone and hydro.  I discussed with patient recommendation for prompt drainage of the right kidney.  Given very large UPJ stone I think perc nephrostomy would be preferred over attempt at retrograde stent placement.  I discussed nature of procedure with patient and she agrees to proceed. Discussed case with IR (  Dr. Lowella Dandy)  Joline Maxcy 07/07/2022, 5:40 AM

## 2022-07-07 NOTE — ED Notes (Signed)
Off unit via stretcher to IR

## 2022-07-07 NOTE — Progress Notes (Addendum)
PROGRESS NOTE  Caitlin Thompson  MVH:[id number] DOB: Jul 23, 1960 DOA: 07/06/2022 PCP: Kerri Perches, MD   Brief Narrative: Patient is a 62 year old female with history of hypertension, hyperlipidemia, type 2 Diabetes with A1c of 6.5 as per 3/15 who presented to the emergency room at [facility] with complaint of vomiting, right flank pain.  On presentation she was hypertensive.  Lab work showed WBC count of 15.2, potassium of 3.3, creatinine of 1.1.  CT abdomen/pelvis with contrast showed obstructing 10 x 16 x 19 mm calculus within the right ureteropelvic junction resulting in moderate right hydronephrosis, superimposed mild bilateral nonobstructing nephrolithiasis.  Urology consulted.S/P  IR guided nephrostomy tube placement.  Started on broad spectrum  antibiotics.  Culture sent  Assessment & Plan:  Principal Problem:   Sepsis secondary to UTI [facility]) Active Problems:   Hyperlipidemia LDL goal <100   Essential hypertension   Acute pyelonephritis   Type 2 diabetes mellitus with hyperglycemia (HCC)   Nephrolithiasis   Hypokalemia   Prolonged QT interval   Elevated MCV   Sepsis secondary to nephrolithiasis/possible pyelonephritis: presented with fever, tachycardia, tachypnea, elevated white cell count.  Imaging showed obstructing 10 x 16 x 19 mm calculus within the right ureteropelvic junction resulting in moderate right hydronephrosis, superimposed mild bilateral nonobstructing nephrolithiasis.  Started on IV antibiotics.  Last urine culture 09/24/2020 showed E. coli sensitive to Cipro. Follow-up urine culture/blood culture.  Currently on vancomycin and cefepime.  Urology following.  Status post IR guided right-sided percutaneous nephrostomy tube placement. Spiked fever this morning.Significant leukocytosis.  Nephrolithiasis: CT imaging as above.  Status post IR guided right-sided percutaneous nephrostomy tube placement.  Further management as per urology  AKI: Creatinine trended up to  2.1 today.  Continue IV fluids.  Coffee-ground emesis: Had a projectile  vomiting with dark-colored material this morning.  Continue Protonix.  Continue to monitor hemoglobin.  Continue antiemetics  Hypokalemia/hypomagnesemia: Being supplemented and monitored  Prolonged QTc: QTc of 516 on presentation.  Potassium supplemented.  Monitor,check EKG tomorrow  Diabetes type 2: A1c of 6.5 as per 3/15.  Currently on sliding scale.  Microcytosis: Vitamin B12 and folate level optimal  Hypertension: Bp soft.  Takes carvedilol, spironolactone at home, currently on hold  Hyperlipidemia: Takes crestor at home  Obesity: BMI of 31.2        DVT prophylaxis:SCDs Start: 07/07/22 0131     Code Status: Full Code  Family Communication: None at the bedside  Patient status:Inpatient  Patient is from :home  Anticipated discharge WU:XLKG  Estimated DC date: After culture reports are available, urology clearance   Consultants: Urology  Procedures:  nephrostomy tube placement.  Antimicrobials:  Anti-infectives (From admission, onward)    Start     Dose/Rate Route Frequency Ordered Stop   07/08/22 2000  vancomycin (VANCOREADY) IVPB 1250 mg/250 mL        1,250 mg 166.7 mL/hr over 90 Minutes Intravenous Every 36 hours 07/07/22 0610     07/07/22 0800  ciprofloxacin (CIPRO) IVPB 400 mg        400 mg 200 mL/hr over 60 Minutes Intravenous Every 12 hours 07/07/22 0211     07/07/22 0600  vancomycin (VANCOCIN) IVPB 1000 mg/200 mL premix        1,000 mg 200 mL/hr over 60 Minutes Intravenous  Once 07/07/22 0549 07/07/22 0805   07/07/22 0600  ceFEPIme (MAXIPIME) 2 g in sodium chloride 0.9 % 100 mL IVPB        2 g 200 mL/hr over 30  Minutes Intravenous Every 12 hours 07/07/22 0549     07/06/22 2245  ciprofloxacin (CIPRO) IVPB 400 mg        400 mg 200 mL/hr over 60 Minutes Intravenous  Once 07/06/22 2231 07/07/22 0205       Subjective: Patient seen and examined at bedside today.  During my  evaluation, she looked comfortable.  She was still having some discomfort on the right flank but it has significantly improved since she came in here.  EKG monitor showed sinus tachycardia.  Blood pressure was stable.  Spiked fever of 100.7 F this morning  Objective: Vitals:   07/07/22 0600 07/07/22 0645 07/07/22 0745 07/07/22 0800  BP: (!) 141/87 (!) 135/107 (!) 144/80 (!) 140/81  Pulse: (!) 118 (!) 117 (!) 118 (!) 116  Resp: (!) 21 20  16   Temp:      TempSrc:      SpO2: 93% 97% 95% 97%  Weight:      Height:        Intake/Output Summary (Last 24 hours) at 07/07/2022 0827 Last data filed at 07/07/2022 0805 Gross per 24 hour  Intake 2500 ml  Output 850 ml  Net 1650 ml   Filed Weights   07/06/22 1831  Weight: 82.6 kg    Examination:  General exam: Overall comfortable, not in distress,obese HEENT: PERRL Respiratory system:  no wheezes or crackles  Cardiovascular system: S1 & S2 heard, RRR.  Gastrointestinal system: Abdomen is nondistended, soft and nontender. Central nervous system: Alert and oriented Extremities: No edema, no clubbing ,no cyanosis Skin: No rashes, no ulcers,no icterus     Data Reviewed: I have personally reviewed following labs and imaging studies  CBC: Recent Labs  Lab 07/06/22 1851  WBC 18.2*  NEUTROABS 14.0*  HGB 13.2  HCT 43.3  MCV 101.4*  PLT 246   Basic Metabolic Panel: Recent Labs  Lab 07/06/22 1851  NA 135  K 3.3*  CL 100  CO2 19*  GLUCOSE 165*  BUN 20  CREATININE 1.12*  CALCIUM 9.2     Recent Results (from the past 240 hour(s))  Culture, blood (Routine X 2) w Reflex to ID Panel     Status: None (Preliminary result)   Collection Time: 07/07/22  2:47 AM   Specimen: BLOOD  Result Value Ref Range Status   Specimen Description BLOOD BLOOD RIGHT HAND  Final   Special Requests   Final    BOTTLES DRAWN AEROBIC AND ANAEROBIC Blood Culture adequate volume   Culture   Final    NO GROWTH < 12 HOURS Performed at [facility],  [address], [place], [place] [address]    Report Status PENDING  Incomplete  Culture, blood (Routine X 2) w Reflex to ID Panel     Status: None (Preliminary result)   Collection Time: 07/07/22  2:49 AM   Specimen: BLOOD  Result Value Ref Range Status   Specimen Description BLOOD BLOOD RIGHT WRIST  Final   Special Requests   Final    BOTTLES DRAWN AEROBIC AND ANAEROBIC Blood Culture adequate volume   Culture   Final    NO GROWTH < 12 HOURS Performed at [facility], [address] [place], [place] [address]    Report Status PENDING  Incomplete  Resp panel by RT-PCR (RSV, Flu A&B, Covid) Anterior Nasal Swab     Status: None   Collection Time: 07/07/22  6:30 AM   Specimen: Anterior Nasal Swab  Result Value Ref Range Status  SARS Coronavirus 2 by RT PCR NEGATIVE NEGATIVE Final    Comment: (NOTE) SARS-CoV-2 target nucleic acids are NOT DETECTED.  The SARS-CoV-2 RNA is generally detectable in upper respiratory specimens during the acute phase of infection. The lowest concentration of SARS-CoV-2 viral copies this assay can detect is 138 copies/mL. A negative result does not preclude SARS-Cov-2 infection and should not be used as the sole basis for treatment or other patient management decisions. A negative result may occur with  improper specimen collection/handling, submission of specimen other than nasopharyngeal swab, presence of viral mutation(s) within the areas targeted by this assay, and inadequate number of viral copies(<138 copies/mL). A negative result must be combined with clinical observations, patient history, and epidemiological information. The expected result is Negative.  Fact Sheet for Patients:  BloggerCourse.com  Fact Sheet for Healthcare Providers:  SeriousBroker.it  This test is no t yet approved or cleared by the [place] FDA and  has been authorized for detection and/or diagnosis of SARS-CoV-2 by FDA under  an Emergency Use Authorization (EUA). This EUA will remain  in effect (meaning this test can be used) for the duration of the COVID-19 declaration under Section 564(b)(1) of the Act, 21 U.S.C.section 360bbb-3(b)(1), unless the authorization is terminated  or revoked sooner.       Influenza A by PCR NEGATIVE NEGATIVE Final   Influenza B by PCR NEGATIVE NEGATIVE Final    Comment: (NOTE) The Xpert Xpress SARS-CoV-2/FLU/RSV plus assay is intended as an aid in the diagnosis of influenza from Nasopharyngeal swab specimens and should not be used as a sole basis for treatment. Nasal washings and aspirates are unacceptable for Xpert Xpress SARS-CoV-2/FLU/RSV testing.  Fact Sheet for Patients: BloggerCourse.com  Fact Sheet for Healthcare Providers: SeriousBroker.it  This test is not yet approved or cleared by the [place] FDA and has been authorized for detection and/or diagnosis of SARS-CoV-2 by FDA under an Emergency Use Authorization (EUA). This EUA will remain in effect (meaning this test can be used) for the duration of the COVID-19 declaration under Section 564(b)(1) of the Act, 21 U.S.C. section 360bbb-3(b)(1), unless the authorization is terminated or revoked.     Resp Syncytial Virus by PCR NEGATIVE NEGATIVE Final    Comment: (NOTE) Fact Sheet for Patients: BloggerCourse.com  Fact Sheet for Healthcare Providers: SeriousBroker.it  This test is not yet approved or cleared by the [place] FDA and has been authorized for detection and/or diagnosis of SARS-CoV-2 by FDA under an Emergency Use Authorization (EUA). This EUA will remain in effect (meaning this test can be used) for the duration of the COVID-19 declaration under Section 564(b)(1) of the Act, 21 U.S.C. section 360bbb-3(b)(1), unless the authorization is terminated or revoked.  Performed at [facility], [address] [place], [place] [address]      Radiology Studies: CT ABDOMEN PELVIS W CONTRAST  Result Date: 07/06/2022 CLINICAL DATA:  Abdominal pain, acute, nonlocalized, vomiting EXAM: CT ABDOMEN AND PELVIS WITH CONTRAST TECHNIQUE: Multidetector CT imaging of the abdomen and pelvis was performed using the standard protocol following bolus administration of intravenous contrast. RADIATION DOSE REDUCTION: This exam was performed according to the departmental dose-optimization program which includes automated exposure control, adjustment of the mA and/or kV according to patient size and/or use of iterative reconstruction technique. CONTRAST:  OMNIPAQUE IOHEXOL 300 MG/ML  SOLN COMPARISON:  08/20/2013 FINDINGS: Lower chest: No acute abnormality.  Small hiatal hernia. Hepatobiliary: Mild hepatomegaly. No enhancing intrahepatic mass. No intra or extrahepatic biliary  ductal dilation. Gallbladder unremarkable. Pancreas: Unremarkable Spleen: Normal in size without focal abnormality. Adrenals/Urinary Tract: The adrenal glands are unremarkable. The kidneys are normal in size and position. Moderate right hydronephrosis secondary to an obstructing 10 x 16 x 19 mm calculus within the right ureteropelvic junction. Moderate right perinephric stranding. Delayed right renal cortical enhancement. Superimposed bilateral nonobstructing nephrolithiasis is seen with calculi within the lower pole measuring up to 9 mm on the right and 10 mm on the left. No hydronephrosis on the left. No additional ureteral calculi. The bladder is unremarkable. Stomach/Bowel: Mild scattered colonic diverticulosis. The stomach, small bowel, and large bowel are otherwise unremarkable. Appendix normal. No evidence of obstruction or focal inflammation. No free intraperitoneal gas or fluid. Vascular/Lymphatic: Aortic atherosclerosis. No enlarged abdominal or pelvic lymph nodes. Reproductive: Uterus and bilateral adnexa are  unremarkable. Other: No abdominal wall hernia or abnormality. No abdominopelvic ascites. Musculoskeletal: Degenerative changes are seen within the lumbar spine. No acute bone abnormality. No lytic or blastic bone lesion. IMPRESSION: 1. Obstructing 10 x 16 x 19 mm calculus within the right ureteropelvic junction resulting in moderate right hydronephrosis and delayed right renal cortical enhancement. Superimposed mild bilateral nonobstructing nephrolithiasis. Aortic Atherosclerosis (ICD10-I70.0). Electronically Signed   By: Helyn Numbers M.D.   On: 07/06/2022 22:08   DG Chest 1 View  Result Date: 07/06/2022 CLINICAL DATA:  Shortness of breath, nausea EXAM: CHEST  1 VIEW COMPARISON:  Chest radiograph 03/05/2018 FINDINGS: The cardiomediastinal silhouette is normal There is no focal consolidation or pulmonary edema. There is no pleural effusion or pneumothorax There is no acute osseous abnormality. IMPRESSION: No radiographic evidence of acute cardiopulmonary process. Electronically Signed   By: Lesia Hausen M.D.   On: 07/06/2022 19:25    Scheduled Meds:  insulin aspart  0-15 Units Subcutaneous Q4H   Continuous Infusions:  sodium chloride 100 mL/hr at 07/07/22 0141   ceFEPime (MAXIPIME) IV Stopped (07/07/22 5409)   ciprofloxacin 400 mg (07/07/22 0805)   [START ON 07/08/2022] vancomycin       LOS: 1 day   Burnadette Pop, MD [facility] P5/05/2022, 8:27 AM

## 2022-07-07 NOTE — ED Notes (Signed)
Report given to ED [facility] charge nurse

## 2022-07-07 NOTE — ED Notes (Signed)
Nurse received pt lying down in bed resting, pt stated she felt like she was going to vomit. Nurse provided emesis basin. Pt declined any other assistance's.

## 2022-07-07 NOTE — ED Notes (Signed)
Arrived back to unit, asleep respirations unlabored

## 2022-07-07 NOTE — ED Notes (Signed)
Report from [employer] in IR. Patient had a right percutaneous nephrostomy tube placed. Was given 2mg  Versed IV and Fentanyl IV.

## 2022-07-07 NOTE — ED Provider Notes (Addendum)
  Provider Note MRN:  [id number]  Arrival date & time: 07/07/22    ED Course and Medical Decision Making  Assumed care from Dr Clayborne Dana as transfer from [facility]  See note from prior team for complete details, in brief:  62 yo female Abd pain, obstructing stone noted on imaging, ua w/o overt infection Concern for sepsis but unclear source, possibly pyelo, septic stone?  Started on abx by prior team  D/w Dr Margo Aye urology who will come eval in ED may require stenting Updated [facility], d/w Dr Thomes Dinning  Tachycardic, fortunately BP is stable, not requiring pressors D/w Dr Margo Aye, going to d/w IR for eval for perc drain Broaden abx Admitted [facility]    .Critical Care  Performed by: Sloan Leiter, DO Authorized by: Sloan Leiter, DO   Critical care provider statement:    Critical care time (minutes):  32   Critical care time was exclusive of:  Separately billable procedures and treating other patients   Critical care was necessary to treat or prevent imminent or life-threatening deterioration of the following conditions:  Sepsis   Critical care was time spent personally by me on the following activities:  Development of treatment plan with patient or surrogate, discussions with consultants, evaluation of patient's response to treatment, examination of patient, ordering and review of laboratory studies, ordering and review of radiographic studies, ordering and performing treatments and interventions, pulse oximetry, re-evaluation of patient's condition, review of old charts and obtaining history from patient or surrogate   Care discussed with: admitting provider     Final Clinical Impressions(s) / ED Diagnoses     ICD-10-CM   1. Obstructive nephropathy  N13.8     2. Abdominal pain, unspecified abdominal location  R10.9     3. Sepsis, due to unspecified organism, unspecified whether acute organ dysfunction present [facility])  A41.9       ED Discharge Orders     None       Discharge Instructions    None        Sloan Leiter, DO 07/07/22 0508    Sloan Leiter, DO 07/07/22 0532

## 2022-07-07 NOTE — Progress Notes (Signed)
Pharmacy Antibiotic Note  Caitlin Thompson is a 62 y.o. female admitted on 07/06/2022 with pmhx significant for IDDM, HTN, and hyperlipidemia and well as prior h/o nephrolithiasis who is transferred from [facility] and admitted to medical service with urosepsis and 2cm right UPJ stone. Marland Kitchen  Pharmacy has been consulted to dose vancomycin and cefepime.  Plan: Vancomycin 1gm IV x 1 then 1250mg  q36h (AUC 467.5, Scr 1.12) Cefepime 2gm IV q12h Follow renal function, cultures and clinical course  Height: 5\' 4"  (162.6 cm) Weight: 82.6 kg (182 lb) IBW/kg (Calculated) : 54.7  Temp (24hrs), Avg:100.5 F (38.1 C), Min:99.3 F (37.4 C), Max:102.3 F (39.1 C)  Recent Labs  Lab 07/06/22 1851  WBC 18.2*  CREATININE 1.12*    Estimated Creatinine Clearance: 56.3 mL/min (A) (by C-G formula based on SCr of 1.12 mg/dL (H)).    Allergies  Allergen Reactions   Codeine Nausea And Vomiting   Parlodel [Bromocriptine Mesylate]     headache   Penicillins Hives    Did it involve swelling of the face/tongue/throat, SOB, or low BP? No Did it involve sudden or severe rash/hives, skin peeling, or any reaction on the inside of your mouth or nose? Yes Did you need to seek medical attention at a hospital or doctor's office? Yes When did it last happen?      8 years ago If all above answers are "NO", may proceed with cephalosporin use.    Sulfa Antibiotics Hives, Nausea And Vomiting and Other (See Comments)   Sulfonamide Derivatives Hives    Antimicrobials this admission: 5/2 cipro >> 5/2 vanc >> 5/2 cefepime >>  Dose adjustments this admission:   Microbiology results: 5/3 BCx:   Thank you for allowing pharmacy to be a part of this patient's care.  Arley Phenix RPh 07/07/2022, 6:07 AM

## 2022-07-07 NOTE — ED Notes (Signed)
Purewick in place per RN. Pt is altered at this time and HR is high as well.

## 2022-07-07 NOTE — Consult Note (Signed)
Chief Complaint: Patient was seen in consultation today for  Chief Complaint  Patient presents with   Emesis    Referring Physician(s): Dr. Marcha Solders, Urology  Supervising Physician: Irish Lack  Patient Status: [facility] - ED  History of Present Illness: Caitlin Thompson is a 62 y.o. female with a medical history significant for HTN, DM2, left hydronephrosis, and renal stones. She presented to the [facility] ED 07/06/22 with complaints of vomiting and right flank/right lower abdominal pain. Work up was notable for leukocytosis and an obstructing calculus within the right ureteropelvic junction with right hydronephrosis.   IMPRESSION: 1. Obstructing 10 x 16 x 19 mm calculus within the right ureteropelvic junction resulting in moderate right hydronephrosis and delayed right renal cortical enhancement. Superimposed mild bilateral nonobstructing nephrolithiasis.  Urology consulted on patient and the recommendation was made for right PCN. Interventional Radiology has been asked to evaluate this patient for an image-guided right percutaneous nephrostomy tube.   Past Medical History:  Diagnosis Date   Adenomatous colon polyp 06/26/2011   05/17/11 Colonoscopy Dr Allayne Butcher adenoma Next colonoscopy 05/2016    DERMATOMYCOSIS 05/23/2010   Qualifier: Diagnosis of  By: Lodema Hong MD, Margaret     Diabetes mellitus type II 2007   GERD (gastroesophageal reflux disease) 04/03/2011   Non-critical Schatzki's ring, HH on EGD 05/17/11 Dr Jena Gauss    Hematuria    HYDRONEPHROSIS, LEFT 08/30/2009   Qualifier: Diagnosis of  By: Garnette Czech PA, Dawn     HYPERLIPIDEMIA 12/01/2009   Qualifier: Diagnosis of  By: Garnette Czech PA, Dawn     Hypertension    Obesity (BMI 30.0-34.9) 07/15/2011   Renal lithiasis 6/11   Hospitalized a [facility] 3 days in June    Schatzki's ring    THYROID STIMULATING HORMONE, ABNORMAL 08/30/2009   Qualifier: Diagnosis of  By: Garnette Czech PA, Dawn     Tubular adenoma of colon 3/13     Past Surgical History:  Procedure Laterality Date   COLONOSCOPY N/A 01/29/2019   Procedure: COLONOSCOPY;  Surgeon: Corbin Ade, MD;  Location: [facility];  Service: Endoscopy;  Laterality: N/A;  2:00   COLONOSCOPY W/ POLYPECTOMY  05/17/11   Rourk-Tubular adenoma colon, benign SB bx   ESOPHAGEAL DILATION  05/17/2011   Rourk-Hiatal hernia/Incomplete noncritical Schatzki's ring/HH   POLYPECTOMY  01/29/2019   Procedure: POLYPECTOMY;  Surgeon: Corbin Ade, MD;  Location: [facility];  Service: Endoscopy;;   WISDOM TOOTH EXTRACTION      Allergies: Codeine, Parlodel [bromocriptine mesylate], Penicillins, Sulfa antibiotics, and Sulfonamide derivatives  Medications: Prior to Admission medications   Medication Sig Start Date End Date Taking? Authorizing Provider  amLODipine (NORVASC) 10 MG tablet Take 1 tablet by mouth once daily 10/06/21   Kerri Perches, MD  carvedilol (COREG) 3.125 MG tablet Take 1 tablet (3.125 mg total) by mouth 2 (two) times daily with a meal. 05/19/22   Kerri Perches, MD  glucose blood (ONETOUCH VERIO) test strip USE 1 STRIP TO CHECK GLUCOSE THREE TIMES DAILY 05/17/22   Kerri Perches, MD  insulin NPH-regular Human (70-30) 100 UNIT/ML injection Take 40  units in the morning and take 10 units in the evening 08/05/21   Kerri Perches, MD  nitrofurantoin, macrocrystal-monohydrate, (MACROBID) 100 MG capsule Take 1 capsule (100 mg total) by mouth 2 (two) times daily. 05/24/22   Kerri Perches, MD  OneTouch Delica Lancets 33G MISC Three times daily testing dx e11.65 03/24/20   Kerri Perches, MD  rosuvastatin (CRESTOR) 10 MG tablet Take 1 tablet (10 mg total) by mouth daily. 09/28/20   Kerri Perches, MD  spironolactone (ALDACTONE) 100 MG tablet Take 1 tablet (100 mg total) by mouth daily. 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[place] [employer]     Comment: [place]    Employer: [employer]  Tobacco Use   Smoking status: Never   Smokeless tobacco: Never  Substance and Sexual Activity   Alcohol use: No   Drug use: No   Sexual activity: Yes    Birth control/protection: None  Other Topics Concern   Not on file  Social History Narrative   1 GROWN DAUGHTER   Social Determinants of Health   Financial Resource Strain: Not on file  Food Insecurity: Not on file  Transportation Needs: Not on file  Physical Activity: Not on file  Stress: Not on file  Social Connections: Not on file    Review of Systems: A 12 point ROS discussed and pertinent positives are indicated in the HPI above.  All other systems are negative.  Review of Systems  Constitutional:  Positive for fatigue. Negative for appetite change.  Respiratory:  Negative for cough.   Cardiovascular:  Negative for chest pain and leg swelling.  Gastrointestinal:  Negative for abdominal pain, diarrhea, nausea and vomiting.  Genitourinary:  Positive for flank pain.  Neurological:  Negative for dizziness and headaches.    Vital Signs: BP (!) 140/81   Pulse (!) 116   Temp (!) 100.7 F (38.2 C) (Oral)   Resp 16   Ht 5\' 4"  (1.626 m)   Wt 182 lb (82.6 kg)   SpO2 97%   BMI 31.24 kg/m   Physical Exam Constitutional:      General: She is not in acute distress.    Appearance: She is not ill-appearing.  HENT:     Mouth/Throat:     Mouth: Mucous membranes are moist.     Pharynx: Oropharynx is clear.  Cardiovascular:     Rate and  Rhythm: Normal rate and regular rhythm.     Pulses: Normal pulses.     Heart sounds: Normal heart sounds.  Pulmonary:     Effort: Pulmonary effort is normal.     Breath sounds: Normal breath sounds.  Abdominal:     General: Bowel sounds are normal.     Palpations: Abdomen is soft.     Tenderness: There is no abdominal tenderness.     Comments: Flank pain  Musculoskeletal:     Right lower leg: No edema.     Left lower leg: No edema.  Skin:    General: Skin is warm and dry.  Neurological:     Mental Status: She is alert and oriented to person, place, and time.  Imaging: CT ABDOMEN PELVIS W CONTRAST  Result Date: 07/06/2022 CLINICAL DATA:  Abdominal pain, acute, nonlocalized, vomiting EXAM: CT ABDOMEN AND PELVIS WITH CONTRAST TECHNIQUE: Multidetector CT imaging of the abdomen and pelvis was performed using the standard protocol following bolus administration of intravenous contrast. RADIATION DOSE REDUCTION: This exam was performed according to the departmental dose-optimization program which includes automated exposure control, adjustment of the mA and/or kV according to patient size and/or use of iterative reconstruction technique. CONTRAST:  OMNIPAQUE IOHEXOL 300 MG/ML  SOLN COMPARISON:  08/20/2013 FINDINGS: Lower chest: No acute abnormality.  Small hiatal hernia. Hepatobiliary: Mild hepatomegaly. No enhancing intrahepatic mass. No intra or extrahepatic biliary ductal dilation. Gallbladder unremarkable. Pancreas: Unremarkable Spleen: Normal in size without focal abnormality. Adrenals/Urinary Tract: The adrenal glands are unremarkable. The kidneys are normal in size and position. Moderate right hydronephrosis secondary to an obstructing 10 x 16 x 19 mm calculus within the right ureteropelvic junction. Moderate right perinephric stranding. Delayed right renal cortical enhancement. Superimposed bilateral nonobstructing nephrolithiasis is seen with calculi within the lower pole measuring  up to 9 mm on the right and 10 mm on the left. No hydronephrosis on the left. No additional ureteral calculi. The bladder is unremarkable. Stomach/Bowel: Mild scattered colonic diverticulosis. The stomach, small bowel, and large bowel are otherwise unremarkable. Appendix normal. No evidence of obstruction or focal inflammation. No free intraperitoneal gas or fluid. Vascular/Lymphatic: Aortic atherosclerosis. No enlarged abdominal or pelvic lymph nodes. Reproductive: Uterus and bilateral adnexa are unremarkable. Other: No abdominal wall hernia or abnormality. No abdominopelvic ascites. Musculoskeletal: Degenerative changes are seen within the lumbar spine. No acute bone abnormality. No lytic or blastic bone lesion. IMPRESSION: 1. Obstructing 10 x 16 x 19 mm calculus within the right ureteropelvic junction resulting in moderate right hydronephrosis and delayed right renal cortical enhancement. Superimposed mild bilateral nonobstructing nephrolithiasis. Aortic Atherosclerosis (ICD10-I70.0). Electronically Signed   By: Helyn Numbers M.D.   On: 07/06/2022 22:08   DG Chest 1 View  Result Date: 07/06/2022 CLINICAL DATA:  Shortness of breath, nausea EXAM: CHEST  1 VIEW COMPARISON:  Chest radiograph 03/05/2018 FINDINGS: The cardiomediastinal silhouette is normal There is no focal consolidation or pulmonary edema. There is no pleural effusion or pneumothorax There is no acute osseous abnormality. IMPRESSION: No radiographic evidence of acute cardiopulmonary process. Electronically Signed   By: Lesia Hausen M.D.   On: 07/06/2022 19:25    Labs:  CBC: Recent Labs    08/05/21 1544 05/19/22 1612 07/06/22 1851  WBC 7.7 9.0 18.2*  HGB 12.7 13.1 13.2  HCT 38.5 39.1 43.3  PLT 307 361 246    COAGS: Recent Labs    07/07/22 0632  INR 1.2    BMP: Recent Labs    11/11/21 1604 02/10/22 1605 05/19/22 1612 07/06/22 1851  NA 139 144 143 135  K 4.1 3.8 4.3 3.3*  CL 101 107* 103 100  CO2 16* 21 25 19*   GLUCOSE 278* 54* 135* 165*  BUN 16 14 16 20   CALCIUM 9.5 9.8 10.1 9.2  CREATININE 0.89 0.95 0.96 1.12*  GFRNONAA  --   --   --  57*    LIVER FUNCTION TESTS: Recent Labs    08/05/21 1544 02/10/22 1605 05/19/22 1612  BILITOT 0.5 0.3 0.3  AST 20 14 16   ALT 15 14 15   ALKPHOS 86 78 81  PROT 8.1 7.7 8.0  ALBUMIN 4.4 4.2 4.7    TUMOR MARKERS: No results for input(s): "AFPTM", "CEA", "CA199", "CHROMGRNA" in  the last 8760 hours.  Assessment and Plan:  Right renal calculi with right hydronephrosis: Caitlin Thompson, 62 year old female, is tentatively scheduled today for an image-guided right percutaneous nephrostomy tube.   Risks and benefits of Right PCN placement was discussed with the patient including, but not limited to, infection, bleeding, significant bleeding causing loss or decrease in renal function or damage to adjacent structures.   All of the patient's questions were answered, patient is agreeable to proceed. She has been NPO. She has not received any blood-thinning medications.   Consent signed and in chart.  Thank you for this interesting consult.  I greatly enjoyed meeting Caitlin Thompson and look forward to participating in their care.  A copy of this report was sent to the requesting provider on this date.  Electronically Signed: Alwyn Ren, AGACNP-BC [phone] 07/07/2022, 8:06 AM   I spent a total of 20 Minutes    in face to face in clinical consultation, greater than 50% of which was counseling/coordinating care for right PCN

## 2022-07-08 DIAGNOSIS — A419 Sepsis, unspecified organism: Secondary | ICD-10-CM | POA: Diagnosis not present

## 2022-07-08 DIAGNOSIS — N390 Urinary tract infection, site not specified: Secondary | ICD-10-CM | POA: Diagnosis not present

## 2022-07-08 LAB — GLUCOSE, CAPILLARY
Glucose-Capillary: 130 mg/dL — ABNORMAL HIGH (ref 70–99)
Glucose-Capillary: 131 mg/dL — ABNORMAL HIGH (ref 70–99)
Glucose-Capillary: 146 mg/dL — ABNORMAL HIGH (ref 70–99)
Glucose-Capillary: 158 mg/dL — ABNORMAL HIGH (ref 70–99)
Glucose-Capillary: 162 mg/dL — ABNORMAL HIGH (ref 70–99)
Glucose-Capillary: 170 mg/dL — ABNORMAL HIGH (ref 70–99)
Glucose-Capillary: 176 mg/dL — ABNORMAL HIGH (ref 70–99)

## 2022-07-08 LAB — CBC
HCT: 31.1 % — ABNORMAL LOW (ref 36.0–46.0)
Hemoglobin: 10.1 g/dL — ABNORMAL LOW (ref 12.0–15.0)
MCH: 31.7 pg (ref 26.0–34.0)
MCHC: 32.5 g/dL (ref 30.0–36.0)
MCV: 97.5 fL (ref 80.0–100.0)
Platelets: 154 10*3/uL (ref 150–400)
RBC: 3.19 MIL/uL — ABNORMAL LOW (ref 3.87–5.11)
RDW: 12.7 % (ref 11.5–15.5)
WBC: 20.2 10*3/uL — ABNORMAL HIGH (ref 4.0–10.5)
nRBC: 0 % (ref 0.0–0.2)

## 2022-07-08 LAB — BASIC METABOLIC PANEL
Anion gap: 9 (ref 5–15)
BUN: 25 mg/dL — ABNORMAL HIGH (ref 6–20)
CO2: 19 mmol/L — ABNORMAL LOW (ref 22–32)
Calcium: 7.2 mg/dL — ABNORMAL LOW (ref 8.9–10.3)
Chloride: 108 mmol/L (ref 98–111)
Creatinine, Ser: 1.46 mg/dL — ABNORMAL HIGH (ref 0.44–1.00)
GFR, Estimated: 41 mL/min — ABNORMAL LOW (ref >60)
Glucose, Bld: 147 mg/dL — ABNORMAL HIGH (ref 70–99)
Potassium: 3.7 mmol/L (ref 3.5–5.1)
Sodium: 136 mmol/L (ref 135–145)

## 2022-07-08 LAB — CULTURE, BLOOD (ROUTINE X 2)

## 2022-07-08 LAB — MAGNESIUM: Magnesium: 2.4 mg/dL (ref 1.7–2.4)

## 2022-07-08 LAB — URINE CULTURE

## 2022-07-08 MED ORDER — ORAL CARE MOUTH RINSE: 15.0000 mL | OROMUCOSAL | Status: DC | PRN

## 2022-07-08 MED ORDER — POTASSIUM CHLORIDE CRYS ER 20 MEQ PO TBCR
40.0000 meq | EXTENDED_RELEASE_TABLET | Freq: Once | ORAL | Status: AC
  Administered 2022-07-08: 40 meq via ORAL
  Filled 2022-07-08: qty 2

## 2022-07-08 MED ORDER — PANTOPRAZOLE SODIUM 40 MG PO TBEC
40.0000 mg | DELAYED_RELEASE_TABLET | Freq: Two times a day (BID) | ORAL | Status: DC
  Administered 2022-07-08 – 2022-07-11 (×6): 40 mg via ORAL
  Filled 2022-07-08 (×6): qty 1

## 2022-07-08 MED ORDER — SODIUM CHLORIDE FLUSH 0.9 % IV SOLN: INTRAVENOUS | 2 refills | Status: DC

## 2022-07-08 MED ORDER — VANCOMYCIN HCL IN DEXTROSE 1-5 GM/200ML-% IV SOLN
1000.0000 mg | INTRAVENOUS | Status: DC
  Administered 2022-07-08: 1000 mg via INTRAVENOUS
  Filled 2022-07-08: qty 200

## 2022-07-08 NOTE — Plan of Care (Signed)
  Problem: Fluid Volume: Goal: Ability to maintain a balanced intake and output will improve Outcome: Progressing   Problem: Metabolic: Goal: Ability to maintain appropriate glucose levels will improve Outcome: Progressing   Problem: Health Behavior/Discharge Planning: Goal: Ability to identify and utilize available resources and services will improve Outcome: Adequate for Discharge Goal: Ability to manage health-related needs will improve Outcome: Adequate for Discharge   Problem: Skin Integrity: Goal: Risk for impaired skin integrity will decrease Outcome: Adequate for Discharge   Problem: Tissue Perfusion: Goal: Adequacy of tissue perfusion will improve Outcome: Adequate for Discharge   Problem: Education: Goal: Knowledge of General Education information will improve Description: Including pain rating scale, medication(s)/side effects and non-pharmacologic comfort measures Outcome: Adequate for Discharge   Problem: Health Behavior/Discharge Planning: Goal: Ability to manage health-related needs will improve Outcome: Adequate for Discharge   Problem: Clinical Measurements: Goal: Ability to maintain clinical measurements within normal limits will improve Outcome: Adequate for Discharge Goal: Will remain free from infection Outcome: Adequate for Discharge Goal: Diagnostic test results will improve Outcome: Adequate for Discharge Goal: Respiratory complications will improve Outcome: Adequate for Discharge Goal: Cardiovascular complication will be avoided Outcome: Adequate for Discharge

## 2022-07-08 NOTE — Progress Notes (Signed)
Referring Physician(s): Dr Marcha Solders  Supervising Physician: Simonne Come  Patient Status:  [facility] - In-pt  Chief Complaint:  Obstructive nephrolithiasis  Subjective:  Patient alert, lying in bed at time of exam. She reports feeling somewhat better today compared to yesterday.  Allergies: Codeine, Parlodel [bromocriptine mesylate], Penicillins, Sulfa antibiotics, and Sulfonamide derivatives  Medications: Prior to Admission medications   Medication Sig Start Date End Date Taking? Authorizing Provider  carvedilol (COREG) 3.125 MG tablet Take 1 tablet (3.125 mg total) by mouth 2 (two) times daily with a meal. 05/19/22  Yes Kerri Perches, MD  insulin NPH-regular Human (70-30) 100 UNIT/ML injection Take 40  units in the morning and take 10 units in the evening Patient taking differently: Inject 30 Units into the skin daily. 08/05/21  Yes Kerri Perches, MD  rosuvastatin (CRESTOR) 10 MG tablet Take 1 tablet (10 mg total) by mouth daily. 09/28/20  Yes Kerri Perches, MD  sodium chloride flush 0.9 % SOLN injection Flush nephrostomy drain once daily with 5 mL of saline 07/08/22  Yes Kennieth Francois, PA  glucose blood (ONETOUCH VERIO) test strip USE 1 STRIP TO CHECK GLUCOSE THREE TIMES DAILY 05/17/22   Kerri Perches, MD  nitrofurantoin, macrocrystal-monohydrate, (MACROBID) 100 MG capsule Take 1 capsule (100 mg total) by mouth 2 (two) times daily. Patient not taking: Reported on 07/07/2022 05/24/22   Kerri Perches, MD  OneTouch Delica Lancets 33G MISC Three times daily testing dx e11.65 03/24/20   Kerri Perches, MD  spironolactone (ALDACTONE) 100 MG tablet Take 1 tablet (100 mg total) by mouth daily. Patient not taking: Reported on 07/07/2022 02/17/22   Kerri Perches, MD     Vital Signs: BP 127/72 (BP Location: Right Arm)   Pulse 96   Temp 98 F (36.7 C) (Oral)   Resp 16   Ht 5\' 4"  (1.626 m)   Wt 182 lb (82.6 kg)   SpO2 99%   BMI 31.24 kg/m    Physical Exam Vitals reviewed.  Pulmonary:     Effort: Pulmonary effort is normal.  Abdominal:     Palpations: Abdomen is soft.     Comments: Right PCN in place, ~100 mL of yellow urine in gravity bag at time of exam. Suture intact. Dressed appropriately, dressing clean, dry, intact.  Skin:    General: Skin is warm and dry.  Neurological:     Mental Status: She is alert and oriented to person, place, and time.  Psychiatric:        Mood and Affect: Mood normal.        Behavior: Behavior normal.        Thought Content: Thought content normal.        Judgment: Judgment normal.     Imaging: IR NEPHROSTOMY PLACEMENT RIGHT  Result Date: 07/07/2022 CLINICAL DATA:  Sepsis secondary to obstructing calculus of the right renal pelvis causing hydronephrosis. The patient requires decompression of the right renal collecting system with percutaneous nephrostomy. EXAM: 1. ULTRASOUND GUIDANCE FOR PUNCTURE OF THE RIGHT RENAL COLLECTING SYSTEM. 2. RIGHT PERCUTANEOUS NEPHROSTOMY TUBE PLACEMENT. COMPARISON:  None Available. ANESTHESIA/SEDATION: Moderate (conscious) sedation was employed during this procedure. A total of Versed 2.0 mg and Fentanyl 100 mcg was administered intravenously. Moderate Sedation Time: 19 minutes. The patient's level of consciousness and vital signs were monitored continuously by radiology nursing throughout the procedure under my direct supervision. CONTRAST:  10 mL Omnipaque 300 MEDICATIONS: None FLUOROSCOPY TIME:  2 minutes and 30  seconds.  46.0 mGy. PROCEDURE: The procedure, risks, benefits, and alternatives were explained to the patient. Questions regarding the procedure were encouraged and answered. The patient understands and consents to the procedure. A time-out was performed prior to initiating the procedure. The right flank region was prepped with Betadine in a sterile fashion, and a sterile drape was applied covering the operative field. A sterile gown and sterile gloves were  used for the procedure. Local anesthesia was provided with 1% Lidocaine. Ultrasound was used to localize the right kidney. Under direct ultrasound guidance, a 21 gauge needle was advanced into the renal collecting system. Ultrasound image documentation was performed. Aspiration of urine sample was performed followed by contrast injection. A transitional dilator was advanced over a guidewire. Percutaneous tract dilatation was then performed over the guidewire. A 10-French percutaneous nephrostomy tube was then advanced and formed in the collecting system. A urine sample was aspirated from the nephrostomy tube and sent for culture analysis. Catheter position was confirmed by fluoroscopy after contrast injection. The catheter was secured at the skin with a Prolene retention suture and Stat-Lock device. A gravity bag was placed. COMPLICATIONS: None. FINDINGS: Ultrasound demonstrates moderate hydronephrosis of the right kidney. Fluoroscopy demonstrates retained excreted contrast material within the collecting system and an obstructing calculus at the level of the renal pelvis and UPJ. After access of the collecting system via the lower pole/interpolar region, a 10 French nephrostomy tube was placed and formed in the renal pelvis. There is return of blood tinged urine. A sample was sent for culture. The nephrostomy tube was attached to a gravity drainage bag. IMPRESSION: Right-sided percutaneous nephrostomy tube placement with placement of a 10 French catheter formed in the renal pelvis. There is return of blood tinged urine. The catheter will be left to gravity bag drainage. A urine sample was sent for culture analysis. Electronically Signed   By: Irish Lack M.D.   On: 07/07/2022 11:31   CT ABDOMEN PELVIS W CONTRAST  Result Date: 07/06/2022 CLINICAL DATA:  Abdominal pain, acute, nonlocalized, vomiting EXAM: CT ABDOMEN AND PELVIS WITH CONTRAST TECHNIQUE: Multidetector CT imaging of the abdomen and pelvis was  performed using the standard protocol following bolus administration of intravenous contrast. RADIATION DOSE REDUCTION: This exam was performed according to the departmental dose-optimization program which includes automated exposure control, adjustment of the mA and/or kV according to patient size and/or use of iterative reconstruction technique. CONTRAST:  OMNIPAQUE IOHEXOL 300 MG/ML  SOLN COMPARISON:  08/20/2013 FINDINGS: Lower chest: No acute abnormality.  Small hiatal hernia. Hepatobiliary: Mild hepatomegaly. No enhancing intrahepatic mass. No intra or extrahepatic biliary ductal dilation. Gallbladder unremarkable. Pancreas: Unremarkable Spleen: Normal in size without focal abnormality. Adrenals/Urinary Tract: The adrenal glands are unremarkable. The kidneys are normal in size and position. Moderate right hydronephrosis secondary to an obstructing 10 x 16 x 19 mm calculus within the right ureteropelvic junction. Moderate right perinephric stranding. Delayed right renal cortical enhancement. Superimposed bilateral nonobstructing nephrolithiasis is seen with calculi within the lower pole measuring up to 9 mm on the right and 10 mm on the left. No hydronephrosis on the left. No additional ureteral calculi. The bladder is unremarkable. Stomach/Bowel: Mild scattered colonic diverticulosis. The stomach, small bowel, and large bowel are otherwise unremarkable. Appendix normal. No evidence of obstruction or focal inflammation. No free intraperitoneal gas or fluid. Vascular/Lymphatic: Aortic atherosclerosis. No enlarged abdominal or pelvic lymph nodes. Reproductive: Uterus and bilateral adnexa are unremarkable. Other: No abdominal wall hernia or abnormality. No abdominopelvic ascites. Musculoskeletal:  Degenerative changes are seen within the lumbar spine. No acute bone abnormality. No lytic or blastic bone lesion. IMPRESSION: 1. Obstructing 10 x 16 x 19 mm calculus within the right ureteropelvic junction resulting  in moderate right hydronephrosis and delayed right renal cortical enhancement. Superimposed mild bilateral nonobstructing nephrolithiasis. Aortic Atherosclerosis (ICD10-I70.0). Electronically Signed   By: Helyn Numbers M.D.   On: 07/06/2022 22:08   DG Chest 1 View  Result Date: 07/06/2022 CLINICAL DATA:  Shortness of breath, nausea EXAM: CHEST  1 VIEW COMPARISON:  Chest radiograph 03/05/2018 FINDINGS: The cardiomediastinal silhouette is normal There is no focal consolidation or pulmonary edema. There is no pleural effusion or pneumothorax There is no acute osseous abnormality. IMPRESSION: No radiographic evidence of acute cardiopulmonary process. Electronically Signed   By: Lesia Hausen M.D.   On: 07/06/2022 19:25    Labs:  CBC: Recent Labs    05/19/22 1612 07/06/22 1851 07/07/22 0840 07/08/22 0915  WBC 9.0 18.2* 17.0* 20.2*  HGB 13.1 13.2 11.5* 10.1*  HCT 39.1 43.3 36.7 31.1*  PLT 361 246 177 154    COAGS: Recent Labs    07/07/22 0632  INR 1.2    BMP: Recent Labs    05/19/22 1612 07/06/22 1851 07/07/22 0840 07/08/22 0915  NA 143 135 137 136  K 4.3 3.3* 3.1* 3.7  CL 103 100 101 108  CO2 25 19* 20* 19*  GLUCOSE 135* 165* 163* 147*  BUN 16 20 22* 25*  CALCIUM 10.1 9.2 7.9* 7.2*  CREATININE 0.96 1.12* 2.10* 1.46*  GFRNONAA  --  57* 27* 41*    LIVER FUNCTION TESTS: Recent Labs    08/05/21 1544 02/10/22 1605 05/19/22 1612  BILITOT 0.5 0.3 0.3  AST 20 14 16   ALT 15 14 15   ALKPHOS 86 78 81  PROT 8.1 7.7 8.0  ALBUMIN 4.4 4.2 4.7    Assessment and Plan:  Obstructive right nephrolithiasis -Patient tolerating drain well. IR team informed that patient is likely to be discharged tomorrow. Drain care education provided to patient and her daughter via telephone -No growth to date from urine culture collected 07/07/22 during PCN placement  -R PCN with 900 mL of output recorded since placement. 100 mL of yellow urine in bag at time of exam -Per note from Urology, plan  is for patient to undergo percutaneous nephrolithotomy via existing PCN once she has recovered from sepsis. Follow-up order has been placed for routine PCN exchange if patient has not undergone PCNL with PCN removal in 6-8 weeks, IR scheduler to contact patient in ~1 month to determine if patient will need routine exchange scheduled -Please contact IR team with any questions or concerns   Electronically Signed: Kennieth Francois, PA 07/08/2022, 12:46 PM   I spent a total of 25 Minutes at the the patient's bedside AND on the patient's hospital floor or unit, greater than 50% of which was counseling/coordinating care for obstructive nephrolithiasis

## 2022-07-08 NOTE — Progress Notes (Signed)
  Transition of Care (TOC) Screening Note   Patient Details  Name: Caitlin Thompson Date of Birth: 1960-06-16   Transition of Care [facility]) CM/SW Contact:    Otelia Santee, LCSW Phone Number: 07/08/2022, 1:17 PM    Transition of Care Department [facility]) has reviewed patient and no TOC needs have been identified at this time. We will continue to monitor patient advancement through interdisciplinary progression rounds. If new patient transition needs arise, please place a TOC consult.

## 2022-07-08 NOTE — Progress Notes (Signed)
Pharmacy Antibiotic Note  Caitlin Thompson is a 62 y.o. female admitted on 07/06/2022 with pmhx significant for IDDM, HTN, and hyperlipidemia and well as prior h/o nephrolithiasis who is transferred from [facility] and admitted to medical service with urosepsis and 2cm right UPJ stone.   Pharmacy has been consulted to dose vancomycin and cefepime.  07/08/2022 SCr down to 1.46 AF, WBC 20.2  Plan: Change vancomycin to 1000 mg IV every 36 hours (Goal AUC 400-550, eAUC 473 SCr used: 1.46, Vd 0.5) Continue Cefepime 2gm IV q12h for CrCl 30-60 ml/min Follow renal function, cultures and clinical course Consider de-escalating to ceftriaxone   Height: 5\' 4"  (162.6 cm) Weight: 82.6 kg (182 lb) IBW/kg (Calculated) : 54.7  Temp (24hrs), Avg:98.5 F (36.9 C), Min:98 F (36.7 C), Max:99.4 F (37.4 C)  Recent Labs  Lab 07/06/22 1851 07/07/22 0840 07/08/22 0915  WBC 18.2* 17.0* 20.2*  CREATININE 1.12* 2.10* 1.46*     Estimated Creatinine Clearance: 43.2 mL/min (A) (by C-G formula based on SCr of 1.46 mg/dL (H)).    Allergies  Allergen Reactions   Codeine Nausea And Vomiting   Parlodel [Bromocriptine Mesylate]     headache   Penicillins Hives   Sulfa Antibiotics Hives, Nausea And Vomiting and Other (See Comments)   Sulfonamide Derivatives Hives   Antimicrobials this admission: 5/2 cipro >>5/3 5/2 vanc >> 5/2 cefepime >>   Dose adjustments this admission: 5/3 change vanc to 1g q48h  5/4 change vanc to 1gm q36   Microbiology results: 5/3 BCx: ngtd 5/3 UCx: ngF  (after abx)   Thank you for allowing pharmacy to be a part of this patient's care.  Herby Abraham, Pharm.D Use secure chat for questions 07/08/2022 11:13 AM

## 2022-07-08 NOTE — Progress Notes (Signed)
Subjective: Caitlin Thompson is feeling better following right NT placement yesterday.  She is afebrile and normotensive. With good output from the tube.  ROS:  Review of Systems  All other systems reviewed and are negative.   Anti-infectives: Anti-infectives (From admission, onward)    Start     Dose/Rate Route Frequency Ordered Stop   07/09/22 0800  vancomycin (VANCOCIN) IVPB 1000 mg/200 mL premix        1,000 mg 200 mL/hr over 60 Minutes Intravenous Every 48 hours 07/07/22 1004     07/08/22 2000  vancomycin (VANCOREADY) IVPB 1250 mg/250 mL  Status:  Discontinued        1,250 mg 166.7 mL/hr over 90 Minutes Intravenous Every 36 hours 07/07/22 0610 07/07/22 1004   07/07/22 0800  ciprofloxacin (CIPRO) IVPB 400 mg  Status:  Discontinued        400 mg 200 mL/hr over 60 Minutes Intravenous Every 12 hours 07/07/22 0211 07/07/22 0910   07/07/22 0600  vancomycin (VANCOCIN) IVPB 1000 mg/200 mL premix        1,000 mg 200 mL/hr over 60 Minutes Intravenous  Once 07/07/22 0549 07/07/22 0805   07/07/22 0600  ceFEPIme (MAXIPIME) 2 g in sodium chloride 0.9 % 100 mL IVPB        2 g 200 mL/hr over 30 Minutes Intravenous Every 12 hours 07/07/22 0549     07/06/22 2245  ciprofloxacin (CIPRO) IVPB 400 mg        400 mg 200 mL/hr over 60 Minutes Intravenous  Once 07/06/22 2231 07/07/22 0205       Current Facility-Administered Medications  Medication Dose Route Frequency Provider Last Rate Last Admin   0.9 %  sodium chloride infusion   Intravenous Continuous Burnadette Pop, MD 125 mL/hr at 07/08/22 0754 New Bag at 07/08/22 0754   acetaminophen (TYLENOL) tablet 650 mg  650 mg Oral Q6H PRN Adefeso, Oladapo, DO       Or   acetaminophen (TYLENOL) suppository 650 mg  650 mg Rectal Q6H PRN Adefeso, Oladapo, DO   650 mg at 07/07/22 0143   ceFEPIme (MAXIPIME) 2 g in sodium chloride 0.9 % 100 mL IVPB  2 g Intravenous Q12H Maurice March, RPH 200 mL/hr at 07/08/22 0523 2 g at 07/08/22 0523   hydrALAZINE  (APRESOLINE) injection 10 mg  10 mg Intravenous Q6H PRN Adefeso, Oladapo, DO       HYDROmorphone (DILAUDID) injection 0.5 mg  0.5 mg Intravenous Q3H PRN Adefeso, Oladapo, DO   0.5 mg at 07/08/22 0545   insulin aspart (novoLOG) injection 0-15 Units  0-15 Units Subcutaneous Q4H Adefeso, Oladapo, DO   2 Units at 07/08/22 0813   pantoprazole (PROTONIX) injection 40 mg  40 mg Intravenous Q12H Burnadette Pop, MD   40 mg at 07/08/22 0812   sodium chloride flush (NS) 0.9 % injection 5 mL  5 mL Intracatheter Q8H Irish Lack, MD   5 mL at 07/08/22 0527   [START ON 07/09/2022] vancomycin (VANCOCIN) IVPB 1000 mg/200 mL premix  1,000 mg Intravenous Q48H Lynden Ang, RPH         Objective: Vital signs in last 24 hours: Temp:  [98 F (36.7 C)-99.4 F (37.4 C)] 98 F (36.7 C) (05/04 0821) Pulse Rate:  [96-132] 96 (05/04 0821) Resp:  [16-22] 16 (05/04 0821) BP: (88-143)/(53-80) 127/72 (05/04 0821) SpO2:  [93 %-100 %] 99 % (05/04 0821)  Intake/Output from previous day: 05/03 0701 - 05/04 0700 In: 2828.8 [I.V.:1628.8; IV Piggyback:1200] Out: 1401 [Urine:1400; Emesis/NG  output:1] Intake/Output this shift: No intake/output data recorded.   Physical Exam Vitals reviewed.  Constitutional:      Appearance: Normal appearance.  Abdominal:     Comments: Right NT is draining yellow cloudy urine.   Neurological:     Mental Status: She is alert.     Lab Results:  Recent Labs    07/06/22 1851 07/07/22 0840  WBC 18.2* 17.0*  HGB 13.2 11.5*  HCT 43.3 36.7  PLT 246 177   BMET Recent Labs    07/06/22 1851 07/07/22 0840  NA 135 137  K 3.3* 3.1*  CL 100 101  CO2 19* 20*  GLUCOSE 165* 163*  BUN 20 22*  CREATININE 1.12* 2.10*  CALCIUM 9.2 7.9*   PT/INR Recent Labs    07/07/22 0632  LABPROT 14.6  INR 1.2   ABG Recent Labs    07/06/22 2044  HCO3 24.6    Studies/Results: IR NEPHROSTOMY PLACEMENT RIGHT  Result Date: 07/07/2022 CLINICAL DATA:  Sepsis secondary to obstructing  calculus of the right renal pelvis causing hydronephrosis. The patient requires decompression of the right renal collecting system with percutaneous nephrostomy. EXAM: 1. ULTRASOUND GUIDANCE FOR PUNCTURE OF THE RIGHT RENAL COLLECTING SYSTEM. 2. RIGHT PERCUTANEOUS NEPHROSTOMY TUBE PLACEMENT. COMPARISON:  None Available. ANESTHESIA/SEDATION: Moderate (conscious) sedation was employed during this procedure. A total of Versed 2.0 mg and Fentanyl 100 mcg was administered intravenously. Moderate Sedation Time: 19 minutes. The patient's level of consciousness and vital signs were monitored continuously by radiology nursing throughout the procedure under my direct supervision. CONTRAST:  10 mL Omnipaque 300 MEDICATIONS: None FLUOROSCOPY TIME:  2 minutes and 30 seconds.  46.0 mGy. PROCEDURE: The procedure, risks, benefits, and alternatives were explained to the patient. Questions regarding the procedure were encouraged and answered. The patient understands and consents to the procedure. A time-out was performed prior to initiating the procedure. The right flank region was prepped with Betadine in a sterile fashion, and a sterile drape was applied covering the operative field. A sterile gown and sterile gloves were used for the procedure. Local anesthesia was provided with 1% Lidocaine. Ultrasound was used to localize the right kidney. Under direct ultrasound guidance, a 21 gauge needle was advanced into the renal collecting system. Ultrasound image documentation was performed. Aspiration of urine sample was performed followed by contrast injection. A transitional dilator was advanced over a guidewire. Percutaneous tract dilatation was then performed over the guidewire. A 10-French percutaneous nephrostomy tube was then advanced and formed in the collecting system. A urine sample was aspirated from the nephrostomy tube and sent for culture analysis. Catheter position was confirmed by fluoroscopy after contrast injection. The  catheter was secured at the skin with a Prolene retention suture and Stat-Lock device. A gravity bag was placed. COMPLICATIONS: None. FINDINGS: Ultrasound demonstrates moderate hydronephrosis of the right kidney. Fluoroscopy demonstrates retained excreted contrast material within the collecting system and an obstructing calculus at the level of the renal pelvis and UPJ. After access of the collecting system via the lower pole/interpolar region, a 10 French nephrostomy tube was placed and formed in the renal pelvis. There is return of blood tinged urine. A sample was sent for culture. The nephrostomy tube was attached to a gravity drainage bag. IMPRESSION: Right-sided percutaneous nephrostomy tube placement with placement of a 10 French catheter formed in the renal pelvis. There is return of blood tinged urine. The catheter will be left to gravity bag drainage. A urine sample was sent for culture analysis. Electronically Signed  By: Irish Lack M.D.   On: 07/07/2022 11:31   CT ABDOMEN PELVIS W CONTRAST  Result Date: 07/06/2022 CLINICAL DATA:  Abdominal pain, acute, nonlocalized, vomiting EXAM: CT ABDOMEN AND PELVIS WITH CONTRAST TECHNIQUE: Multidetector CT imaging of the abdomen and pelvis was performed using the standard protocol following bolus administration of intravenous contrast. RADIATION DOSE REDUCTION: This exam was performed according to the departmental dose-optimization program which includes automated exposure control, adjustment of the mA and/or kV according to patient size and/or use of iterative reconstruction technique. CONTRAST:  OMNIPAQUE IOHEXOL 300 MG/ML  SOLN COMPARISON:  08/20/2013 FINDINGS: Lower chest: No acute abnormality.  Small hiatal hernia. Hepatobiliary: Mild hepatomegaly. No enhancing intrahepatic mass. No intra or extrahepatic biliary ductal dilation. Gallbladder unremarkable. Pancreas: Unremarkable Spleen: Normal in size without focal abnormality. Adrenals/Urinary  Tract: The adrenal glands are unremarkable. The kidneys are normal in size and position. Moderate right hydronephrosis secondary to an obstructing 10 x 16 x 19 mm calculus within the right ureteropelvic junction. Moderate right perinephric stranding. Delayed right renal cortical enhancement. Superimposed bilateral nonobstructing nephrolithiasis is seen with calculi within the lower pole measuring up to 9 mm on the right and 10 mm on the left. No hydronephrosis on the left. No additional ureteral calculi. The bladder is unremarkable. Stomach/Bowel: Mild scattered colonic diverticulosis. The stomach, small bowel, and large bowel are otherwise unremarkable. Appendix normal. No evidence of obstruction or focal inflammation. No free intraperitoneal gas or fluid. Vascular/Lymphatic: Aortic atherosclerosis. No enlarged abdominal or pelvic lymph nodes. Reproductive: Uterus and bilateral adnexa are unremarkable. Other: No abdominal wall hernia or abnormality. No abdominopelvic ascites. Musculoskeletal: Degenerative changes are seen within the lumbar spine. No acute bone abnormality. No lytic or blastic bone lesion. IMPRESSION: 1. Obstructing 10 x 16 x 19 mm calculus within the right ureteropelvic junction resulting in moderate right hydronephrosis and delayed right renal cortical enhancement. Superimposed mild bilateral nonobstructing nephrolithiasis. Aortic Atherosclerosis (ICD10-I70.0). Electronically Signed   By: Helyn Numbers M.D.   On: 07/06/2022 22:08   DG Chest 1 View  Result Date: 07/06/2022 CLINICAL DATA:  Shortness of breath, nausea EXAM: CHEST  1 VIEW COMPARISON:  Chest radiograph 03/05/2018 FINDINGS: The cardiomediastinal silhouette is normal There is no focal consolidation or pulmonary edema. There is no pleural effusion or pneumothorax There is no acute osseous abnormality. IMPRESSION: No radiographic evidence of acute cardiopulmonary process. Electronically Signed   By: Lesia Hausen M.D.   On: 07/06/2022  19:25     Assessment and Plan: 19mm RUPJ stone with obstruction and sepsis now improving s/p right nephrostomy placement.  Continue current managed.  She will need to have a right PCNL arranged when she has recovered from the sepsis.       LOS: 2 days    Bjorn Pippin 5/4/2024Patient ID: Caitlin Thompson, female   DOB: 01-25-1961, 62 y.o.   MRN: [id number]

## 2022-07-08 NOTE — Progress Notes (Signed)
PROGRESS NOTE  RAECHEL DUNKLEBERGER  ZOX:[id number] DOB: [birth year] DOA: 07/06/2022 PCP: Kerri Perches, MD   Brief Narrative: Patient is a 62 year old female with history of hypertension, hyperlipidemia, type 2 Diabetes with A1c of 6.5 as per 3/15 who presented to the emergency room at [facility] with complaint of vomiting, right flank pain.  On presentation she was hypertensive.  Lab work showed WBC count of 15.2, potassium of 3.3, creatinine of 1.1.  CT abdomen/pelvis with contrast showed obstructing 10 x 16 x 19 mm calculus within the right ureteropelvic junction resulting in moderate right hydronephrosis, superimposed mild bilateral nonobstructing nephrolithiasis.  Urology consulted.S/P  IR guided nephrostomy tube placement.  Started on broad spectrum  antibiotics.  Culture sent  Assessment & Plan:  Principal Problem:   Sepsis secondary to UTI [facility]) Active Problems:   Hyperlipidemia LDL goal <100   Essential hypertension   Acute pyelonephritis   Type 2 diabetes mellitus with hyperglycemia (HCC)   Nephrolithiasis   Hypokalemia   Prolonged QT interval   Elevated MCV   Severe sepsis (HCC)   Sepsis secondary to nephrolithiasis/possible pyelonephritis: presented with fever, tachycardia, tachypnea, elevated white cell count.  Imaging showed obstructing 10 x 16 x 19 mm calculus within the right ureteropelvic junction resulting in moderate right hydronephrosis, superimposed mild bilateral nonobstructing nephrolithiasis.  Started on IV antibiotics.  Last urine culture 09/24/2020 showed E. coli sensitive to Cipro.  Currently on vancomycin and cefepime.  Urology following.  Status post IR guided right-sided percutaneous nephrostomy tube placement. Afebrile this morning.  Leukocytosis persist.  Urine culture has being negative.  Blood cultures have not shown any growth yet.  Marland Kitchen  She will be discharged home with nephrostomy tube when she is ready  Nephrolithiasis: CT imaging as above.  Status post  IR guided right-sided percutaneous nephrostomy tube placement.  Urology will arrange outpatient follow-up for right PCNL  AKI: Improved with IV fluid, continue gentle IV fluid for today  Coffee-ground emesis: Had a projectile  vomiting with dark-colored material this morning.  Continue Protonix.  Continue to monitor hemoglobin.  Has remained stable.  Drop in the hemoglobin is most likely from hemodilution from fluid  Hypokalemia/hypomagnesemia: Supplemented and corrected  Prolonged QTc: QTc of 516 on presentation.  Potassium supplemented.  Follow-up EKG shows persistent prolonged QTc of 553.  Monitor closely and telemetry  Diabetes type 2: A1c of 6.5 as per 3/15.  Currently on sliding scale.  Microcytosis: Vitamin B12 and folate level optimal  Hypertension: Bp soft.  Takes carvedilol, spironolactone at home, currently on hold  Hyperlipidemia: Takes crestor at home  Obesity: BMI of 31.2        DVT prophylaxis:SCDs Start: 07/07/22 0131     Code Status: Full Code  Family Communication: None at the bedside  Patient status:Inpatient  Patient is from :home  Anticipated discharge WJ:XBJY  Estimated DC date: tomorrow   Consultants: Urology  Procedures:  nephrostomy tube placement.  Antimicrobials:  Anti-infectives (From admission, onward)    Start     Dose/Rate Route Frequency Ordered Stop   07/09/22 0800  vancomycin (VANCOCIN) IVPB 1000 mg/200 mL premix  Status:  Discontinued        1,000 mg 200 mL/hr over 60 Minutes Intravenous Every 48 hours 07/07/22 1004 07/08/22 1111   07/08/22 2000  vancomycin (VANCOREADY) IVPB 1250 mg/250 mL  Status:  Discontinued        1,250 mg 166.7 mL/hr over 90 Minutes Intravenous Every 36 hours 07/07/22 0610 07/07/22 1004  07/08/22 1800  vancomycin (VANCOCIN) IVPB 1000 mg/200 mL premix        1,000 mg 200 mL/hr over 60 Minutes Intravenous Every 36 hours 07/08/22 1111     07/07/22 0800  ciprofloxacin (CIPRO) IVPB 400 mg  Status:   Discontinued        400 mg 200 mL/hr over 60 Minutes Intravenous Every 12 hours 07/07/22 0211 07/07/22 0910   07/07/22 0600  vancomycin (VANCOCIN) IVPB 1000 mg/200 mL premix        1,000 mg 200 mL/hr over 60 Minutes Intravenous  Once 07/07/22 0549 07/07/22 0805   07/07/22 0600  ceFEPIme (MAXIPIME) 2 g in sodium chloride 0.9 % 100 mL IVPB        2 g 200 mL/hr over 30 Minutes Intravenous Every 12 hours 07/07/22 0549     07/06/22 2245  ciprofloxacin (CIPRO) IVPB 400 mg        400 mg 200 mL/hr over 60 Minutes Intravenous  Once 07/06/22 2231 07/07/22 0205       Subjective: Patient seen and examined at bedside today.  Appears comfortable today.  Denies any new complaints.  No nausea or vomiting or abdominal pain.  Appeared well this morning  Objective: Vitals:   07/07/22 2228 07/08/22 0217 07/08/22 0439 07/08/22 0821  BP: (!) 100/58 114/70 110/64 127/72  Pulse:  100 100 96  Resp: 18 20 20 16   Temp: 98.3 F (36.8 C) 98.2 F (36.8 C) 98.3 F (36.8 C) 98 F (36.7 C)  TempSrc: Oral Oral Oral Oral  SpO2: 94% 99% 93% 99%  Weight:      Height:        Intake/Output Summary (Last 24 hours) at 07/08/2022 1116 Last data filed at 07/08/2022 0941 Gross per 24 hour  Intake 2348.75 ml  Output 1400 ml  Net 948.75 ml   Filed Weights   07/06/22 1831  Weight: 82.6 kg    Examination:  General exam: Overall comfortable, not in distress HEENT: PERRL Respiratory system:  no wheezes or crackles  Cardiovascular system: S1 & S2 heard, RRR.  Gastrointestinal system: Abdomen is nondistended, soft and nontender.  Right-sided nephrostomy tube Central nervous system: Alert and oriented Extremities: No edema, no clubbing ,no cyanosis Skin: No rashes, no ulcers,no icterus       Data Reviewed: I have personally reviewed following labs and imaging studies  CBC: Recent Labs  Lab 07/06/22 1851 07/07/22 0840 07/08/22 0915  WBC 18.2* 17.0* 20.2*  NEUTROABS 14.0*  --   --   HGB 13.2 11.5* 10.1*   HCT 43.3 36.7 31.1*  MCV 101.4* 97.9 97.5  PLT 246 177 154   Basic Metabolic Panel: Recent Labs  Lab 07/06/22 1851 07/07/22 0840 07/08/22 0915  NA 135 137 136  K 3.3* 3.1* 3.7  CL 100 101 108  CO2 19* 20* 19*  GLUCOSE 165* 163* 147*  BUN 20 22* 25*  CREATININE 1.12* 2.10* 1.46*  CALCIUM 9.2 7.9* 7.2*  MG  --  1.1* 2.4     Recent Results (from the past 240 hour(s))  Culture, blood (Routine X 2) w Reflex to ID Panel     Status: None (Preliminary result)   Collection Time: 07/07/22  2:47 AM   Specimen: BLOOD  Result Value Ref Range Status   Specimen Description BLOOD BLOOD RIGHT HAND  Final   Special Requests   Final    BOTTLES DRAWN AEROBIC AND ANAEROBIC Blood Culture adequate volume   Culture   Final  NO GROWTH < 24 HOURS Performed at [facility], [address], [place], [place] [address]    Report Status PENDING  Incomplete  Culture, blood (Routine X 2) w Reflex to ID Panel     Status: None (Preliminary result)   Collection Time: 07/07/22  2:49 AM   Specimen: BLOOD  Result Value Ref Range Status   Specimen Description BLOOD BLOOD RIGHT WRIST  Final   Special Requests   Final    BOTTLES DRAWN AEROBIC AND ANAEROBIC Blood Culture adequate volume   Culture   Final    NO GROWTH < 24 HOURS Performed at [facility], [address], [place], [place] [address]    Report Status PENDING  Incomplete  Resp panel by RT-PCR (RSV, Flu A&B, Covid) Anterior Nasal Swab     Status: None   Collection Time: 07/07/22  6:30 AM   Specimen: Anterior Nasal Swab  Result Value Ref Range Status   SARS Coronavirus 2 by RT PCR NEGATIVE NEGATIVE Final    Comment: (NOTE) SARS-CoV-2 target nucleic acids are NOT DETECTED.  The SARS-CoV-2 RNA is generally detectable in upper respiratory specimens during the acute phase of infection. The lowest concentration of SARS-CoV-2 viral copies this assay can detect is 138 copies/mL. A negative result does not preclude SARS-Cov-2 infection and should  not be used as the sole basis for treatment or other patient management decisions. A negative result may occur with  improper specimen collection/handling, submission of specimen other than nasopharyngeal swab, presence of viral mutation(s) within the areas targeted by this assay, and inadequate number of viral copies(<138 copies/mL). A negative result must be combined with clinical observations, patient history, and epidemiological information. The expected result is Negative.  Fact Sheet for Patients:  BloggerCourse.com  Fact Sheet for Healthcare Providers:  SeriousBroker.it  This test is no t yet approved or cleared by the [place] FDA and  has been authorized for detection and/or diagnosis of SARS-CoV-2 by FDA under an Emergency Use Authorization (EUA). This EUA will remain  in effect (meaning this test can be used) for the duration of the COVID-19 declaration under Section 564(b)(1) of the Act, 21 U.S.C.section 360bbb-3(b)(1), unless the authorization is terminated  or revoked sooner.       Influenza A by PCR NEGATIVE NEGATIVE Final   Influenza B by PCR NEGATIVE NEGATIVE Final    Comment: (NOTE) The Xpert Xpress SARS-CoV-2/FLU/RSV plus assay is intended as an aid in the diagnosis of influenza from Nasopharyngeal swab specimens and should not be used as a sole basis for treatment. Nasal washings and aspirates are unacceptable for Xpert Xpress SARS-CoV-2/FLU/RSV testing.  Fact Sheet for Patients: BloggerCourse.com  Fact Sheet for Healthcare Providers: SeriousBroker.it  This test is not yet approved or cleared by the [place] FDA and has been authorized for detection and/or diagnosis of SARS-CoV-2 by FDA under an Emergency Use Authorization (EUA). This EUA will remain in effect (meaning this test can be used) for the duration of the COVID-19 declaration under  Section 564(b)(1) of the Act, 21 U.S.C. section 360bbb-3(b)(1), unless the authorization is terminated or revoked.     Resp Syncytial Virus by PCR NEGATIVE NEGATIVE Final    Comment: (NOTE) Fact Sheet for Patients: BloggerCourse.com  Fact Sheet for Healthcare Providers: SeriousBroker.it  This test is not yet approved or cleared by the [place] FDA and has been authorized for detection and/or diagnosis of SARS-CoV-2 by FDA under an Emergency Use Authorization (EUA). This EUA will remain in effect (meaning  this test can be used) for the duration of the COVID-19 declaration under Section 564(b)(1) of the Act, 21 U.S.C. section 360bbb-3(b)(1), unless the authorization is terminated or revoked.  Performed at [facility], [address], [place], [place] [address]   Urine Culture     Status: None   Collection Time: 07/07/22 10:54 AM   Specimen: Kidney  Result Value Ref Range Status   Specimen Description KIDNEY  Final   Special Requests RIGHT  Final   Culture   Final    NO GROWTH Performed at [facility], [address], [place], [place] [address]    Report Status 07/08/2022 FINAL  Final     Radiology Studies: IR NEPHROSTOMY PLACEMENT RIGHT  Result Date: 07/07/2022 CLINICAL DATA:  Sepsis secondary to obstructing calculus of the right renal pelvis causing hydronephrosis. The patient requires decompression of the right renal collecting system with percutaneous nephrostomy. EXAM: 1. ULTRASOUND GUIDANCE FOR PUNCTURE OF THE RIGHT RENAL COLLECTING SYSTEM. 2. RIGHT PERCUTANEOUS NEPHROSTOMY TUBE PLACEMENT. COMPARISON:  None Available. ANESTHESIA/SEDATION: Moderate (conscious) sedation was employed during this procedure. A total of Versed 2.0 mg and Fentanyl 100 mcg was administered intravenously. Moderate Sedation Time: 19 minutes. The patient's level of consciousness and vital signs were monitored continuously  by radiology nursing throughout the procedure under my direct supervision. CONTRAST:  10 mL Omnipaque 300 MEDICATIONS: None FLUOROSCOPY TIME:  2 minutes and 30 seconds.  46.0 mGy. PROCEDURE: The procedure, risks, benefits, and alternatives were explained to the patient. Questions regarding the procedure were encouraged and answered. The patient understands and consents to the procedure. A time-out was performed prior to initiating the procedure. The right flank region was prepped with Betadine in a sterile fashion, and a sterile drape was applied covering the operative field. A sterile gown and sterile gloves were used for the procedure. Local anesthesia was provided with 1% Lidocaine. Ultrasound was used to localize the right kidney. Under direct ultrasound guidance, a 21 gauge needle was advanced into the renal collecting system. Ultrasound image documentation was performed. Aspiration of urine sample was performed followed by contrast injection. A transitional dilator was advanced over a guidewire. Percutaneous tract dilatation was then performed over the guidewire. A 10-French percutaneous nephrostomy tube was then advanced and formed in the collecting system. A urine sample was aspirated from the nephrostomy tube and sent for culture analysis. Catheter position was confirmed by fluoroscopy after contrast injection. The catheter was secured at the skin with a Prolene retention suture and Stat-Lock device. A gravity bag was placed. COMPLICATIONS: None. FINDINGS: Ultrasound demonstrates moderate hydronephrosis of the right kidney. Fluoroscopy demonstrates retained excreted contrast material within the collecting system and an obstructing calculus at the level of the renal pelvis and UPJ. After access of the collecting system via the lower pole/interpolar region, a 10 French nephrostomy tube was placed and formed in the renal pelvis. There is return of blood tinged urine. A sample was sent for culture. The  nephrostomy tube was attached to a gravity drainage bag. IMPRESSION: Right-sided percutaneous nephrostomy tube placement with placement of a 10 French catheter formed in the renal pelvis. There is return of blood tinged urine. The catheter will be left to gravity bag drainage. A urine sample was sent for culture analysis. Electronically Signed   By: Irish Lack M.D.   On: 07/07/2022 11:31   CT ABDOMEN PELVIS W CONTRAST  Result Date: 07/06/2022 CLINICAL DATA:  Abdominal pain, acute, nonlocalized, vomiting EXAM: CT ABDOMEN AND PELVIS  WITH CONTRAST TECHNIQUE: Multidetector CT imaging of the abdomen and pelvis was performed using the standard protocol following bolus administration of intravenous contrast. RADIATION DOSE REDUCTION: This exam was performed according to the departmental dose-optimization program which includes automated exposure control, adjustment of the mA and/or kV according to patient size and/or use of iterative reconstruction technique. CONTRAST:  OMNIPAQUE IOHEXOL 300 MG/ML  SOLN COMPARISON:  08/20/2013 FINDINGS: Lower chest: No acute abnormality.  Small hiatal hernia. Hepatobiliary: Mild hepatomegaly. No enhancing intrahepatic mass. No intra or extrahepatic biliary ductal dilation. Gallbladder unremarkable. Pancreas: Unremarkable Spleen: Normal in size without focal abnormality. Adrenals/Urinary Tract: The adrenal glands are unremarkable. The kidneys are normal in size and position. Moderate right hydronephrosis secondary to an obstructing 10 x 16 x 19 mm calculus within the right ureteropelvic junction. Moderate right perinephric stranding. Delayed right renal cortical enhancement. Superimposed bilateral nonobstructing nephrolithiasis is seen with calculi within the lower pole measuring up to 9 mm on the right and 10 mm on the left. No hydronephrosis on the left. No additional ureteral calculi. The bladder is unremarkable. Stomach/Bowel: Mild scattered colonic diverticulosis. The  stomach, small bowel, and large bowel are otherwise unremarkable. Appendix normal. No evidence of obstruction or focal inflammation. No free intraperitoneal gas or fluid. Vascular/Lymphatic: Aortic atherosclerosis. No enlarged abdominal or pelvic lymph nodes. Reproductive: Uterus and bilateral adnexa are unremarkable. Other: No abdominal wall hernia or abnormality. No abdominopelvic ascites. Musculoskeletal: Degenerative changes are seen within the lumbar spine. No acute bone abnormality. No lytic or blastic bone lesion. IMPRESSION: 1. Obstructing 10 x 16 x 19 mm calculus within the right ureteropelvic junction resulting in moderate right hydronephrosis and delayed right renal cortical enhancement. Superimposed mild bilateral nonobstructing nephrolithiasis. Aortic Atherosclerosis (ICD10-I70.0). Electronically Signed   By: Helyn Numbers M.D.   On: 07/06/2022 22:08   DG Chest 1 View  Result Date: 07/06/2022 CLINICAL DATA:  Shortness of breath, nausea EXAM: CHEST  1 VIEW COMPARISON:  Chest radiograph 03/05/2018 FINDINGS: The cardiomediastinal silhouette is normal There is no focal consolidation or pulmonary edema. There is no pleural effusion or pneumothorax There is no acute osseous abnormality. IMPRESSION: No radiographic evidence of acute cardiopulmonary process. Electronically Signed   By: Lesia Hausen M.D.   On: 07/06/2022 19:25    Scheduled Meds:  insulin aspart  0-15 Units Subcutaneous Q4H   pantoprazole  40 mg Oral BID   sodium chloride flush  5 mL Intracatheter Q8H   Continuous Infusions:  sodium chloride 75 mL/hr at 07/08/22 1051   ceFEPime (MAXIPIME) IV 2 g (07/08/22 0523)   vancomycin       LOS: 2 days   Burnadette Pop, MD [facility] P5/06/2022, 11:16 AM

## 2022-07-09 ENCOUNTER — Inpatient Hospital Stay (HOSPITAL_COMMUNITY): Payer: BC Managed Care – PPO

## 2022-07-09 DIAGNOSIS — N390 Urinary tract infection, site not specified: Secondary | ICD-10-CM | POA: Diagnosis not present

## 2022-07-09 DIAGNOSIS — A419 Sepsis, unspecified organism: Secondary | ICD-10-CM | POA: Diagnosis not present

## 2022-07-09 LAB — CBC
HCT: 31 % — ABNORMAL LOW (ref 36.0–46.0)
Hemoglobin: 9.8 g/dL — ABNORMAL LOW (ref 12.0–15.0)
MCH: 31 pg (ref 26.0–34.0)
MCHC: 31.6 g/dL (ref 30.0–36.0)
MCV: 98.1 fL (ref 80.0–100.0)
Platelets: 152 10*3/uL (ref 150–400)
RBC: 3.16 MIL/uL — ABNORMAL LOW (ref 3.87–5.11)
RDW: 12.5 % (ref 11.5–15.5)
WBC: 25.5 10*3/uL — ABNORMAL HIGH (ref 4.0–10.5)
nRBC: 0 % (ref 0.0–0.2)

## 2022-07-09 LAB — BASIC METABOLIC PANEL
Anion gap: 8 (ref 5–15)
BUN: 18 mg/dL (ref 6–20)
CO2: 20 mmol/L — ABNORMAL LOW (ref 22–32)
Calcium: 7.9 mg/dL — ABNORMAL LOW (ref 8.9–10.3)
Chloride: 109 mmol/L (ref 98–111)
Creatinine, Ser: 1.11 mg/dL — ABNORMAL HIGH (ref 0.44–1.00)
GFR, Estimated: 57 mL/min — ABNORMAL LOW (ref >60)
Glucose, Bld: 113 mg/dL — ABNORMAL HIGH (ref 70–99)
Potassium: 4.1 mmol/L (ref 3.5–5.1)
Sodium: 137 mmol/L (ref 135–145)

## 2022-07-09 LAB — GLUCOSE, CAPILLARY
Glucose-Capillary: 111 mg/dL — ABNORMAL HIGH (ref 70–99)
Glucose-Capillary: 115 mg/dL — ABNORMAL HIGH (ref 70–99)
Glucose-Capillary: 126 mg/dL — ABNORMAL HIGH (ref 70–99)
Glucose-Capillary: 127 mg/dL — ABNORMAL HIGH (ref 70–99)
Glucose-Capillary: 139 mg/dL — ABNORMAL HIGH (ref 70–99)
Glucose-Capillary: 164 mg/dL — ABNORMAL HIGH (ref 70–99)

## 2022-07-09 LAB — CULTURE, BLOOD (ROUTINE X 2)

## 2022-07-09 MED ORDER — PROCHLORPERAZINE EDISYLATE 10 MG/2ML IJ SOLN
10.0000 mg | Freq: Four times a day (QID) | INTRAMUSCULAR | Status: DC | PRN
  Administered 2022-07-09 – 2022-07-11 (×2): 10 mg via INTRAVENOUS
  Filled 2022-07-09 (×2): qty 2

## 2022-07-09 MED ORDER — SENNOSIDES-DOCUSATE SODIUM 8.6-50 MG PO TABS
1.0000 | ORAL_TABLET | Freq: Two times a day (BID) | ORAL | Status: DC
  Administered 2022-07-09 – 2022-07-10 (×4): 1 via ORAL
  Filled 2022-07-09 (×4): qty 1

## 2022-07-09 MED ORDER — CARVEDILOL 3.125 MG PO TABS
3.1250 mg | ORAL_TABLET | Freq: Two times a day (BID) | ORAL | Status: DC
  Administered 2022-07-09 – 2022-07-10 (×4): 3.125 mg via ORAL
  Filled 2022-07-09 (×4): qty 1

## 2022-07-09 MED ORDER — VANCOMYCIN HCL IN DEXTROSE 1-5 GM/200ML-% IV SOLN
1000.0000 mg | INTRAVENOUS | Status: DC
  Administered 2022-07-09: 1000 mg via INTRAVENOUS
  Filled 2022-07-09: qty 200

## 2022-07-09 MED ORDER — IOHEXOL 300 MG/ML  SOLN
100.0000 mL | Freq: Once | INTRAMUSCULAR | Status: AC | PRN
  Administered 2022-07-09: 100 mL via INTRAVENOUS

## 2022-07-09 MED ORDER — POLYETHYLENE GLYCOL 3350 17 G PO PACK
17.0000 g | PACK | Freq: Every day | ORAL | Status: DC
  Administered 2022-07-09: 17 g via ORAL
  Filled 2022-07-09 (×2): qty 1

## 2022-07-09 MED ORDER — IOHEXOL 9 MG/ML PO SOLN
ORAL | Status: AC
  Filled 2022-07-09: qty 1000

## 2022-07-09 MED ORDER — TRIMETHOBENZAMIDE HCL 100 MG/ML IM SOLN: 200.0000 mg | Freq: Four times a day (QID) | INTRAMUSCULAR | Status: DC | PRN

## 2022-07-09 NOTE — Plan of Care (Signed)
  Problem: Clinical Measurements: Goal: Ability to maintain clinical measurements within normal limits will improve Outcome: Progressing   Problem: Nutrition: Goal: Adequate nutrition will be maintained Outcome: Progressing   Problem: Pain Managment: Goal: General experience of comfort will improve Outcome: Progressing   

## 2022-07-09 NOTE — Plan of Care (Signed)
  Problem: Coping: Goal: Ability to adjust to condition or change in health will improve Outcome: Progressing   Problem: Health Behavior/Discharge Planning: Goal: Ability to identify and utilize available resources and services will improve Outcome: Progressing   Problem: Skin Integrity: Goal: Risk for impaired skin integrity will decrease Outcome: Progressing   Problem: Health Behavior/Discharge Planning: Goal: Ability to manage health-related needs will improve Outcome: Progressing   Problem: Clinical Measurements: Goal: Respiratory complications will improve Outcome: Progressing Goal: Cardiovascular complication will be avoided Outcome: Progressing   Problem: Activity: Goal: Risk for activity intolerance will decrease Outcome: Progressing

## 2022-07-09 NOTE — Progress Notes (Signed)
Subjective: Caitlin Thompson continues to have some right flank and upper quadrant pain which was aggravated by eating a regular diet.  She has voided without difficulty.  The right NT is draining well.  She is afebrile.  She had some nausea and vomiting yesterday.  Culture remain negative.   WBC is up to 25K.  Hgb is 9.8 and her Cr is down to 1.11.  ROS:  Review of Systems  Gastrointestinal:  Positive for abdominal pain and nausea.  Genitourinary:  Positive for flank pain.  All other systems reviewed and are negative.   Anti-infectives: Anti-infectives (From admission, onward)    Start     Dose/Rate Route Frequency Ordered Stop   07/09/22 0800  vancomycin (VANCOCIN) IVPB 1000 mg/200 mL premix  Status:  Discontinued        1,000 mg 200 mL/hr over 60 Minutes Intravenous Every 48 hours 07/07/22 1004 07/08/22 1111   07/08/22 2000  vancomycin (VANCOREADY) IVPB 1250 mg/250 mL  Status:  Discontinued        1,250 mg 166.7 mL/hr over 90 Minutes Intravenous Every 36 hours 07/07/22 0610 07/07/22 1004   07/08/22 1800  vancomycin (VANCOCIN) IVPB 1000 mg/200 mL premix        1,000 mg 200 mL/hr over 60 Minutes Intravenous Every 36 hours 07/08/22 1111     07/07/22 0800  ciprofloxacin (CIPRO) IVPB 400 mg  Status:  Discontinued        400 mg 200 mL/hr over 60 Minutes Intravenous Every 12 hours 07/07/22 0211 07/07/22 0910   07/07/22 0600  vancomycin (VANCOCIN) IVPB 1000 mg/200 mL premix        1,000 mg 200 mL/hr over 60 Minutes Intravenous  Once 07/07/22 0549 07/07/22 0805   07/07/22 0600  ceFEPIme (MAXIPIME) 2 g in sodium chloride 0.9 % 100 mL IVPB        2 g 200 mL/hr over 30 Minutes Intravenous Every 12 hours 07/07/22 0549     07/06/22 2245  ciprofloxacin (CIPRO) IVPB 400 mg        400 mg 200 mL/hr over 60 Minutes Intravenous  Once 07/06/22 2231 07/07/22 0205       Current Facility-Administered Medications  Medication Dose Route Frequency Provider Last Rate Last Admin   acetaminophen (TYLENOL)  tablet 650 mg  650 mg Oral Q6H PRN Adefeso, Oladapo, DO   650 mg at 07/09/22 0235   Or   acetaminophen (TYLENOL) suppository 650 mg  650 mg Rectal Q6H PRN Adefeso, Oladapo, DO   650 mg at 07/07/22 0143   carvedilol (COREG) tablet 3.125 mg  3.125 mg Oral BID WC Adhikari, Amrit, MD       ceFEPIme (MAXIPIME) 2 g in sodium chloride 0.9 % 100 mL IVPB  2 g Intravenous Q12H Maurice March, RPH 200 mL/hr at 07/09/22 1610 2 g at 07/09/22 9604   hydrALAZINE (APRESOLINE) injection 10 mg  10 mg Intravenous Q6H PRN Adefeso, Oladapo, DO       HYDROmorphone (DILAUDID) injection 0.5 mg  0.5 mg Intravenous Q3H PRN Adefeso, Oladapo, DO   0.5 mg at 07/09/22 0606   insulin aspart (novoLOG) injection 0-15 Units  0-15 Units Subcutaneous Q4H Adefeso, Oladapo, DO   2 Units at 07/09/22 0058   Oral care mouth rinse  15 mL Mouth Rinse PRN Burnadette Pop, MD       pantoprazole (PROTONIX) EC tablet 40 mg  40 mg Oral BID Len Childs T, RPH   40 mg at 07/08/22 2035   sodium chloride  flush (NS) 0.9 % injection 5 mL  5 mL Intracatheter Q8H Irish Lack, MD   5 mL at 07/08/22 2038   vancomycin (VANCOCIN) IVPB 1000 mg/200 mL premix  1,000 mg Intravenous Q36H Herby Abraham, RPH 200 mL/hr at 07/08/22 1729 1,000 mg at 07/08/22 1729     Objective: Vital signs in last 24 hours: Temp:  [98 F (36.7 C)-98.9 F (37.2 C)] 98.5 F (36.9 C) (05/05 0439) Pulse Rate:  [96-97] 96 (05/05 0439) Resp:  [16-18] 18 (05/05 0439) BP: (115-159)/(72-87) 153/87 (05/05 0439) SpO2:  [93 %-100 %] 99 % (05/05 0439)  Intake/Output from previous day: 05/04 0701 - 05/05 0700 In: 3029.1 [P.O.:360; I.V.:2269.2; IV Piggyback:399.9] Out: 1225 [Urine:1225] Intake/Output this shift: No intake/output data recorded.   Physical Exam Vitals reviewed.  Constitutional:      Appearance: Normal appearance.  Abdominal:     Palpations: Abdomen is soft.     Tenderness: There is abdominal tenderness (RUQ). There is right CVA tenderness.      Comments: Right NT is draining yellow urine.   Neurological:     Mental Status: She is alert.     Lab Results:  Recent Labs    07/08/22 0915 07/09/22 0540  WBC 20.2* 25.5*  HGB 10.1* 9.8*  HCT 31.1* 31.0*  PLT 154 152    BMET Recent Labs    07/08/22 0915 07/09/22 0540  NA 136 137  K 3.7 4.1  CL 108 109  CO2 19* 20*  GLUCOSE 147* 113*  BUN 25* 18  CREATININE 1.46* 1.11*  CALCIUM 7.2* 7.9*    PT/INR Recent Labs    07/07/22 0632  LABPROT 14.6  INR 1.2    ABG Recent Labs    07/06/22 2044  HCO3 24.6     Studies/Results: IR NEPHROSTOMY PLACEMENT RIGHT  Result Date: 07/07/2022 CLINICAL DATA:  Sepsis secondary to obstructing calculus of the right renal pelvis causing hydronephrosis. The patient requires decompression of the right renal collecting system with percutaneous nephrostomy. EXAM: 1. ULTRASOUND GUIDANCE FOR PUNCTURE OF THE RIGHT RENAL COLLECTING SYSTEM. 2. RIGHT PERCUTANEOUS NEPHROSTOMY TUBE PLACEMENT. COMPARISON:  None Available. ANESTHESIA/SEDATION: Moderate (conscious) sedation was employed during this procedure. A total of Versed 2.0 mg and Fentanyl 100 mcg was administered intravenously. Moderate Sedation Time: 19 minutes. The patient's level of consciousness and vital signs were monitored continuously by radiology nursing throughout the procedure under my direct supervision. CONTRAST:  10 mL Omnipaque 300 MEDICATIONS: None FLUOROSCOPY TIME:  2 minutes and 30 seconds.  46.0 mGy. PROCEDURE: The procedure, risks, benefits, and alternatives were explained to the patient. Questions regarding the procedure were encouraged and answered. The patient understands and consents to the procedure. A time-out was performed prior to initiating the procedure. The right flank region was prepped with Betadine in a sterile fashion, and a sterile drape was applied covering the operative field. A sterile gown and sterile gloves were used for the procedure. Local anesthesia was  provided with 1% Lidocaine. Ultrasound was used to localize the right kidney. Under direct ultrasound guidance, a 21 gauge needle was advanced into the renal collecting system. Ultrasound image documentation was performed. Aspiration of urine sample was performed followed by contrast injection. A transitional dilator was advanced over a guidewire. Percutaneous tract dilatation was then performed over the guidewire. A 10-French percutaneous nephrostomy tube was then advanced and formed in the collecting system. A urine sample was aspirated from the nephrostomy tube and sent for culture analysis. Catheter position was confirmed by fluoroscopy after  contrast injection. The catheter was secured at the skin with a Prolene retention suture and Stat-Lock device. A gravity bag was placed. COMPLICATIONS: None. FINDINGS: Ultrasound demonstrates moderate hydronephrosis of the right kidney. Fluoroscopy demonstrates retained excreted contrast material within the collecting system and an obstructing calculus at the level of the renal pelvis and UPJ. After access of the collecting system via the lower pole/interpolar region, a 10 French nephrostomy tube was placed and formed in the renal pelvis. There is return of blood tinged urine. A sample was sent for culture. The nephrostomy tube was attached to a gravity drainage bag. IMPRESSION: Right-sided percutaneous nephrostomy tube placement with placement of a 10 French catheter formed in the renal pelvis. There is return of blood tinged urine. The catheter will be left to gravity bag drainage. A urine sample was sent for culture analysis. Electronically Signed   By: Irish Lack M.D.   On: 07/07/2022 11:31     Assessment and Plan: 19mm RUPJ stone with obstruction and sepsis  s/p right nephrostomy placement.  She continues to have pain and her WBC is up but she is afebrile with improvement in her Cr.  Continue current care, but if the leukocytosis and pain persists, repeat CT  might be indicated.  She will need to have a right PCNL arranged when she has recovered from the sepsis.       LOS: 3 days    Bjorn Pippin 5/5/2024Patient ID: Caitlin Thompson, female   DOB: [birth year], 62 y.o.   MRN: [id number] Patient ID: Caitlin Thompson, female   DOB: May 26, 1960, 62 y.o.   MRN: [id number]

## 2022-07-09 NOTE — Progress Notes (Signed)
PROGRESS NOTE  Caitlin Thompson  UJW:[id number] DOB: [birth year] DOA: 07/06/2022 PCP: Kerri Perches, MD   Brief Narrative: Patient is a 62 year old female with history of hypertension, hyperlipidemia, type 2 Diabetes with A1c of 6.5 as per 3/15 who presented to the emergency room at [facility] with complaint of vomiting, right flank pain.  On presentation she was hypertensive.  Lab work showed WBC count of 15.2, potassium of 3.3, creatinine of 1.1.  CT abdomen/pelvis with contrast showed obstructing 10 x 16 x 19 mm calculus within the right ureteropelvic junction resulting in moderate right hydronephrosis, superimposed mild bilateral nonobstructing nephrolithiasis.  Urology consulted.S/P  IR guided nephrostomy tube placement.  Started on broad spectrum  antibiotics.  Culture sent, no growth till date.  Having persistent abdominal discomfort today, leukocytosis worsened so ordering CT scan abdomen/pelvis  Assessment & Plan:  Principal Problem:   Sepsis secondary to UTI [facility]) Active Problems:   Hyperlipidemia LDL goal <100   Essential hypertension   Acute pyelonephritis   Type 2 diabetes mellitus with hyperglycemia (HCC)   Nephrolithiasis   Hypokalemia   Prolonged QT interval   Elevated MCV   Severe sepsis (HCC)   Sepsis secondary to nephrolithiasis/possible pyelonephritis: presented with fever, tachycardia, tachypnea, elevated white cell count.  Imaging showed obstructing 10 x 16 x 19 mm calculus within the right ureteropelvic junction resulting in moderate right hydronephrosis, superimposed mild bilateral nonobstructing nephrolithiasis.  Started on IV antibiotics.  Last urine culture 09/24/2020 showed E. coli sensitive to Cipro.  Currently on vancomycin and cefepime.  Urology following.  Status post IR guided right-sided percutaneous nephrostomy tube placement. Afebrile this morning.  Leukocytosis worsened.  Has abdominal discomfort today so ordering CT abd/pelvis for follow up  Urine  culture has being negative.  Blood cultures have not shown any growth yet.  Marland Kitchen   She will be discharged home with nephrostomy tube when she is ready  Nephrolithiasis: CT imaging as above.  Status post IR guided right-sided percutaneous nephrostomy tube placement.  Urology will arrange outpatient follow-up for right PCNL  AKI: resolved  with IV fluid  Coffee-ground emesis: Had a projectile  vomiting with dark-colored material this morning.  Continue Protonix.  Continue to monitor hemoglobin.  Has remained stable.  Drop in the hemoglobin is most likely from hemodilution from fluid  Hypokalemia/hypomagnesemia: Supplemented and corrected  Prolonged QTc: QTc of 516 on presentation.  Potassium supplemented.  Follow-up EKG shows persistent prolonged QTc of 530.  Monitor closely and telemetry  Diabetes type 2: A1c of 6.5 as per 3/15.  Currently on sliding scale.  Microcytosis: Vitamin B12 and folate level optimal  Hypertension: Bp up now.  Restart carvedilol  Hyperlipidemia: Takes crestor at home  Obesity: BMI of 31.2        DVT prophylaxis:SCDs Start: 07/07/22 0131     Code Status: Full Code  Family Communication: None at the bedside  Patient status:Inpatient  Patient is from :home  Anticipated discharge FA:[id number]  Estimated DC date: 1-2 days   Consultants: Urology  Procedures:  nephrostomy tube placement.  Antimicrobials:  Anti-infectives (From admission, onward)    Start     Dose/Rate Route Frequency Ordered Stop   07/09/22 0800  vancomycin (VANCOCIN) IVPB 1000 mg/200 mL premix  Status:  Discontinued        1,000 mg 200 mL/hr over 60 Minutes Intravenous Every 48 hours 07/07/22 1004 07/08/22 1111   07/08/22 2000  vancomycin (VANCOREADY) IVPB 1250 mg/250 mL  Status:  Discontinued  1,250 mg 166.7 mL/hr over 90 Minutes Intravenous Every 36 hours 07/07/22 0610 07/07/22 1004   07/08/22 1800  vancomycin (VANCOCIN) IVPB 1000 mg/200 mL premix        1,000 mg 200 mL/hr  over 60 Minutes Intravenous Every 36 hours 07/08/22 1111     07/07/22 0800  ciprofloxacin (CIPRO) IVPB 400 mg  Status:  Discontinued        400 mg 200 mL/hr over 60 Minutes Intravenous Every 12 hours 07/07/22 0211 07/07/22 0910   07/07/22 0600  vancomycin (VANCOCIN) IVPB 1000 mg/200 mL premix        1,000 mg 200 mL/hr over 60 Minutes Intravenous  Once 07/07/22 0549 07/07/22 0805   07/07/22 0600  ceFEPIme (MAXIPIME) 2 g in sodium chloride 0.9 % 100 mL IVPB        2 g 200 mL/hr over 30 Minutes Intravenous Every 12 hours 07/07/22 0549     07/06/22 2245  ciprofloxacin (CIPRO) IVPB 400 mg        400 mg 200 mL/hr over 60 Minutes Intravenous  Once 07/06/22 2231 07/07/22 0205       Subjective: Patient seen and examined the bedside today.  Hemodynamically stable.  She complains of increased abdominal discomfort today mainly on the right lower quadrant.  No nausea or vomiting  Objective: Vitals:   07/08/22 1355 07/08/22 1957 07/09/22 0439 07/09/22 0801  BP: 115/72 (!) 159/85 (!) 153/87 (!) 165/89  Pulse: 97 97 96 96  Resp: 16 16 18    Temp: 98.9 F (37.2 C) 98.5 F (36.9 C) 98.5 F (36.9 C)   TempSrc: Oral Oral Oral   SpO2: 100% 93% 99%   Weight:      Height:        Intake/Output Summary (Last 24 hours) at 07/09/2022 1011 Last data filed at 07/09/2022 0000 Gross per 24 hour  Intake 2909.07 ml  Output 1225 ml  Net 1684.07 ml   Filed Weights   07/06/22 1831  Weight: 82.6 kg    Examination:   General exam: Overall comfortable, not in distress, obese HEENT: PERRL Respiratory system:  no wheezes or crackles  Cardiovascular system: S1 & S2 heard, RRR.  Gastrointestinal system: Abdomen is nondistended, soft ,tender on the right side.  Right nephrostomy tube Central nervous system: Alert and oriented Extremities: No edema, no clubbing ,no cyanosis Skin: No rashes, no ulcers,no icterus     Data Reviewed: I have personally reviewed following labs and imaging  studies  CBC: Recent Labs  Lab 07/06/22 1851 07/07/22 0840 07/08/22 0915 07/09/22 0540  WBC 18.2* 17.0* 20.2* 25.5*  NEUTROABS 14.0*  --   --   --   HGB 13.2 11.5* 10.1* 9.8*  HCT 43.3 36.7 31.1* 31.0*  MCV 101.4* 97.9 97.5 98.1  PLT 246 177 154 152   Basic Metabolic Panel: Recent Labs  Lab 07/06/22 1851 07/07/22 0840 07/08/22 0915 07/09/22 0540  NA 135 137 136 137  K 3.3* 3.1* 3.7 4.1  CL 100 101 108 109  CO2 19* 20* 19* 20*  GLUCOSE 165* 163* 147* 113*  BUN 20 22* 25* 18  CREATININE 1.12* 2.10* 1.46* 1.11*  CALCIUM 9.2 7.9* 7.2* 7.9*  MG  --  1.1* 2.4  --      Recent Results (from the past 240 hour(s))  Culture, blood (Routine X 2) w Reflex to ID Panel     Status: None (Preliminary result)   Collection Time: 07/07/22  2:47 AM   Specimen: BLOOD  Result  Value Ref Range Status   Specimen Description BLOOD BLOOD RIGHT HAND  Final   Special Requests   Final    BOTTLES DRAWN AEROBIC AND ANAEROBIC Blood Culture adequate volume   Culture   Final    NO GROWTH 2 DAYS Performed at [facility], [address] [place], [place] [address]    Report Status PENDING  Incomplete  Culture, blood (Routine X 2) w Reflex to ID Panel     Status: None (Preliminary result)   Collection Time: 07/07/22  2:49 AM   Specimen: BLOOD  Result Value Ref Range Status   Specimen Description BLOOD BLOOD RIGHT WRIST  Final   Special Requests   Final    BOTTLES DRAWN AEROBIC AND ANAEROBIC Blood Culture adequate volume   Culture   Final    NO GROWTH 2 DAYS Performed at [facility], [address] [place], [place] [address]    Report Status PENDING  Incomplete  Resp panel by RT-PCR (RSV, Flu A&B, Covid) Anterior Nasal Swab     Status: None   Collection Time: 07/07/22  6:30 AM   Specimen: Anterior Nasal Swab  Result Value Ref Range Status   SARS Coronavirus 2 by RT PCR NEGATIVE NEGATIVE Final    Comment: (NOTE) SARS-CoV-2 target nucleic acids are NOT DETECTED.  The SARS-CoV-2 RNA is  generally detectable in upper respiratory specimens during the acute phase of infection. The lowest concentration of SARS-CoV-2 viral copies this assay can detect is 138 copies/mL. A negative result does not preclude SARS-Cov-2 infection and should not be used as the sole basis for treatment or other patient management decisions. A negative result may occur with  improper specimen collection/handling, submission of specimen other than nasopharyngeal swab, presence of viral mutation(s) within the areas targeted by this assay, and inadequate number of viral copies(<138 copies/mL). A negative result must be combined with clinical observations, patient history, and epidemiological information. The expected result is Negative.  Fact Sheet for Patients:  BloggerCourse.com  Fact Sheet for Healthcare Providers:  SeriousBroker.it  This test is no t yet approved or cleared by the [place] FDA and  has been authorized for detection and/or diagnosis of SARS-CoV-2 by FDA under an Emergency Use Authorization (EUA). This EUA will remain  in effect (meaning this test can be used) for the duration of the COVID-19 declaration under Section 564(b)(1) of the Act, 21 U.S.C.section 360bbb-3(b)(1), unless the authorization is terminated  or revoked sooner.       Influenza A by PCR NEGATIVE NEGATIVE Final   Influenza B by PCR NEGATIVE NEGATIVE Final    Comment: (NOTE) The Xpert Xpress SARS-CoV-2/FLU/RSV plus assay is intended as an aid in the diagnosis of influenza from Nasopharyngeal swab specimens and should not be used as a sole basis for treatment. Nasal washings and aspirates are unacceptable for Xpert Xpress SARS-CoV-2/FLU/RSV testing.  Fact Sheet for Patients: BloggerCourse.com  Fact Sheet for Healthcare Providers: SeriousBroker.it  This test is not yet approved or cleared by the [place] FDA and has been authorized for detection and/or diagnosis of SARS-CoV-2 by FDA under an Emergency Use Authorization (EUA). This EUA will remain in effect (meaning this test can be used) for the duration of the COVID-19 declaration under Section 564(b)(1) of the Act, 21 U.S.C. section 360bbb-3(b)(1), unless the authorization is terminated or revoked.     Resp Syncytial Virus by PCR NEGATIVE NEGATIVE Final    Comment: (NOTE) Fact Sheet for Patients: BloggerCourse.com  Fact Sheet for Healthcare Providers:  SeriousBroker.it  This test is not yet approved or cleared by the [place] and has been authorized for detection and/or diagnosis of SARS-CoV-2 by FDA under an Emergency Use Authorization (EUA). This EUA will remain in effect (meaning this test can be used) for the duration of the COVID-19 declaration under Section 564(b)(1) of the Act, 21 U.S.C. section 360bbb-3(b)(1), unless the authorization is terminated or revoked.  Performed at [facility], [address] [place], [place] [address]   Urine Culture     Status: None   Collection Time: 07/07/22 10:54 AM   Specimen: Kidney  Result Value Ref Range Status   Specimen Description KIDNEY  Final   Special Requests RIGHT  Final   Culture   Final    NO GROWTH Performed at [facility], [address] [place], [place] [address]    Report Status 07/08/2022 FINAL  Final     Radiology Studies: IR NEPHROSTOMY PLACEMENT RIGHT  Result Date: 07/07/2022 CLINICAL DATA:  Sepsis secondary to obstructing calculus of the right renal pelvis causing hydronephrosis. The patient requires decompression of the right renal collecting system with percutaneous nephrostomy. EXAM: 1. ULTRASOUND GUIDANCE FOR PUNCTURE OF THE RIGHT RENAL COLLECTING SYSTEM. 2. RIGHT PERCUTANEOUS NEPHROSTOMY TUBE PLACEMENT. COMPARISON:  None Available. ANESTHESIA/SEDATION: Moderate  (conscious) sedation was employed during this procedure. A total of Versed 2.0 mg and Fentanyl 100 mcg was administered intravenously. Moderate Sedation Time: 19 minutes. The patient's level of consciousness and vital signs were monitored continuously by radiology nursing throughout the procedure under my direct supervision. CONTRAST:  10 mL Omnipaque 300 MEDICATIONS: None FLUOROSCOPY TIME:  2 minutes and 30 seconds.  46.0 mGy. PROCEDURE: The procedure, risks, benefits, and alternatives were explained to the patient. Questions regarding the procedure were encouraged and answered. The patient understands and consents to the procedure. A time-out was performed prior to initiating the procedure. The right flank region was prepped with Betadine in a sterile fashion, and a sterile drape was applied covering the operative field. A sterile gown and sterile gloves were used for the procedure. Local anesthesia was provided with 1% Lidocaine. Ultrasound was used to localize the right kidney. Under direct ultrasound guidance, a 21 gauge needle was advanced into the renal collecting system. Ultrasound image documentation was performed. Aspiration of urine sample was performed followed by contrast injection. A transitional dilator was advanced over a guidewire. Percutaneous tract dilatation was then performed over the guidewire. A 10-French percutaneous nephrostomy tube was then advanced and formed in the collecting system. A urine sample was aspirated from the nephrostomy tube and sent for culture analysis. Catheter position was confirmed by fluoroscopy after contrast injection. The catheter was secured at the skin with a Prolene retention suture and Stat-Lock device. A gravity bag was placed. COMPLICATIONS: None. FINDINGS: Ultrasound demonstrates moderate hydronephrosis of the right kidney. Fluoroscopy demonstrates retained excreted contrast material within the collecting system and an obstructing calculus at the level of the  renal pelvis and UPJ. After access of the collecting system via the lower pole/interpolar region, a 10 French nephrostomy tube was placed and formed in the renal pelvis. There is return of blood tinged urine. A sample was sent for culture. The nephrostomy tube was attached to a gravity drainage bag. IMPRESSION: Right-sided percutaneous nephrostomy tube placement with placement of a 10 French catheter formed in the renal pelvis. There is return of blood tinged urine. The catheter will be left to gravity bag drainage. A urine sample was sent for  culture analysis. Electronically Signed   By: Irish Lack M.D.   On: 07/07/2022 11:31    Scheduled Meds:  carvedilol  3.125 mg Oral BID WC   insulin aspart  0-15 Units Subcutaneous Q4H   pantoprazole  40 mg Oral BID   polyethylene glycol  17 g Oral Daily   senna-docusate  1 tablet Oral BID   sodium chloride flush  5 mL Intracatheter Q8H   Continuous Infusions:  ceFEPime (MAXIPIME) IV 2 g (07/09/22 1610)   vancomycin 1,000 mg (07/08/22 1729)     LOS: 3 days   Burnadette Pop, MD [facility] P5/07/2022, 10:11 AM

## 2022-07-09 NOTE — Progress Notes (Signed)
Pharmacy Antibiotic Note  Caitlin Thompson is a 62 y.o. female admitted on 07/06/2022 with pmhx significant for IDDM, HTN, and hyperlipidemia and well as prior h/o nephrolithiasis who is transferred [facility] and admitted to medical service with urosepsis and 2cm right UPJ stone.   Pharmacy has been consulted to dose vancomycin and cefepime.  07/09/2022 SCr down to 1.11, CrCl ~ 57 ml/min AF, WBC 25.5  Plan: Change vancomycin to 1000 mg IV every 24 hours (Goal AUC 400-550, eAUC 556.5 SCr used: 1.11, Vd 0.5) Continue Cefepime 2gm IV q12h for CrCl 30-60 ml/min Follow renal function, cultures and clinical course Consider de-escalating to ceftriaxone   Height: 5\' 4"  (162.6 cm) Weight: 82.6 kg (182 lb) IBW/kg (Calculated) : 54.7  Temp (24hrs), Avg:98.6 F (37 C), Min:98.5 F (36.9 C), Max:98.9 F (37.2 C)  Recent Labs  Lab 07/06/22 1851 07/07/22 0840 07/08/22 0915 07/09/22 0540  WBC 18.2* 17.0* 20.2* 25.5*  CREATININE 1.12* 2.10* 1.46* 1.11*     Estimated Creatinine Clearance: 56.8 mL/min (A) (by C-G formula based on SCr of 1.11 mg/dL (H)).    Allergies  Allergen Reactions   Codeine Nausea And Vomiting   Parlodel [Bromocriptine Mesylate]     headache   Penicillins Hives   Sulfa Antibiotics Hives, Nausea And Vomiting and Other (See Comments)   Sulfonamide Derivatives Hives   Antimicrobials this admission: 5/2 cipro >>5/3 5/2 vanc >> 5/2 cefepime >>   Dose adjustments this admission: 5/3 change vanc to 1g q48h  5/4 change vanc to 1gm q36 5/5 change vanc to 1 gm q24   Microbiology results: 5/3 BCx: ngtd 5/3 UCx: ngF  (after abx)   Thank you for allowing pharmacy to be a part of this patient's care.  Herby Abraham, Pharm.D Use secure chat for questions 07/09/2022 1:35 PM

## 2022-07-10 DIAGNOSIS — A419 Sepsis, unspecified organism: Secondary | ICD-10-CM | POA: Diagnosis not present

## 2022-07-10 DIAGNOSIS — N390 Urinary tract infection, site not specified: Secondary | ICD-10-CM | POA: Diagnosis not present

## 2022-07-10 LAB — BASIC METABOLIC PANEL
Anion gap: 9 (ref 5–15)
BUN: 12 mg/dL (ref 6–20)
CO2: 22 mmol/L (ref 22–32)
Calcium: 8.3 mg/dL — ABNORMAL LOW (ref 8.9–10.3)
Chloride: 106 mmol/L (ref 98–111)
Creatinine, Ser: 1.01 mg/dL — ABNORMAL HIGH (ref 0.44–1.00)
GFR, Estimated: >60 mL/min (ref >60)
Glucose, Bld: 112 mg/dL — ABNORMAL HIGH (ref 70–99)
Potassium: 3.5 mmol/L (ref 3.5–5.1)
Sodium: 137 mmol/L (ref 135–145)

## 2022-07-10 LAB — CBC
HCT: 31.3 % — ABNORMAL LOW (ref 36.0–46.0)
Hemoglobin: 10.3 g/dL — ABNORMAL LOW (ref 12.0–15.0)
MCH: 31.2 pg (ref 26.0–34.0)
MCHC: 32.9 g/dL (ref 30.0–36.0)
MCV: 94.8 fL (ref 80.0–100.0)
Platelets: 204 10*3/uL (ref 150–400)
RBC: 3.3 MIL/uL — ABNORMAL LOW (ref 3.87–5.11)
RDW: 12.2 % (ref 11.5–15.5)
WBC: 20.6 10*3/uL — ABNORMAL HIGH (ref 4.0–10.5)
nRBC: 0 % (ref 0.0–0.2)

## 2022-07-10 LAB — GLUCOSE, CAPILLARY
Glucose-Capillary: 203 mg/dL — ABNORMAL HIGH (ref 70–99)
Glucose-Capillary: 97 mg/dL (ref 70–99)
Glucose-Capillary: 142 mg/dL — ABNORMAL HIGH (ref 70–99)
Glucose-Capillary: 248 mg/dL — ABNORMAL HIGH (ref 70–99)
Glucose-Capillary: 107 mg/dL — ABNORMAL HIGH (ref 70–99)
Glucose-Capillary: 155 mg/dL — ABNORMAL HIGH (ref 70–99)

## 2022-07-10 LAB — MAGNESIUM: Magnesium: 1.9 mg/dL (ref 1.7–2.4)

## 2022-07-10 MED ORDER — SODIUM CHLORIDE 0.9 % IV SOLN
2.0000 g | Freq: Three times a day (TID) | INTRAVENOUS | Status: DC
  Administered 2022-07-10 – 2022-07-11 (×4): 2 g via INTRAVENOUS
  Filled 2022-07-10 (×4): qty 12.5

## 2022-07-10 MED ORDER — AMLODIPINE BESYLATE 10 MG PO TABS
10.0000 mg | ORAL_TABLET | Freq: Every day | ORAL | Status: DC
  Administered 2022-07-10 – 2022-07-11 (×2): 10 mg via ORAL
  Filled 2022-07-10 (×3): qty 1

## 2022-07-10 MED ORDER — INSULIN ASPART 100 UNIT/ML IJ SOLN
0.0000 [IU] | Freq: Three times a day (TID) | INTRAMUSCULAR | Status: DC
  Administered 2022-07-10: 3 [IU] via SUBCUTANEOUS
  Administered 2022-07-11: 5 [IU] via SUBCUTANEOUS
  Administered 2022-07-11: 2 [IU] via SUBCUTANEOUS

## 2022-07-10 NOTE — Progress Notes (Signed)
Referring Physician(s): Ranelle Oyster  Supervising Physician: Mir, Mauri Reading  Patient Status:  [facility] - In-pt  Chief Complaint: Flank pain, nephrolithiasis   Subjective: Pt doing ok today; states she may go home tomorrow; has some mild rt flank discomfort   Allergies: Codeine, Parlodel [bromocriptine mesylate], Penicillins, Sulfa antibiotics, and Sulfonamide derivatives  Medications: Prior to Admission medications   Medication Sig Start Date End Date Taking? Authorizing Provider  carvedilol (COREG) 3.125 MG tablet Take 1 tablet (3.125 mg total) by mouth 2 (two) times daily with a meal. 05/19/22  Yes Kerri Perches, MD  insulin NPH-regular Human (70-30) 100 UNIT/ML injection Take 40  units in the morning and take 10 units in the evening Patient taking differently: Inject 30 Units into the skin daily. 08/05/21  Yes Kerri Perches, MD  rosuvastatin (CRESTOR) 10 MG tablet Take 1 tablet (10 mg total) by mouth daily. 09/28/20  Yes Kerri Perches, MD  sodium chloride flush 0.9 % SOLN injection Flush nephrostomy drain once daily with 5 mL of saline 07/08/22  Yes Kennieth Francois, PA  glucose blood (ONETOUCH VERIO) test strip USE 1 STRIP TO CHECK GLUCOSE THREE TIMES DAILY 05/17/22   Kerri Perches, MD  nitrofurantoin, macrocrystal-monohydrate, (MACROBID) 100 MG capsule Take 1 capsule (100 mg total) by mouth 2 (two) times daily. Patient not taking: Reported on 07/07/2022 05/24/22   Kerri Perches, MD  OneTouch Delica Lancets 33G MISC Three times daily testing dx e11.65 03/24/20   Kerri Perches, MD  spironolactone (ALDACTONE) 100 MG tablet Take 1 tablet (100 mg total) by mouth daily. Patient not taking: Reported on 07/07/2022 02/17/22   Kerri Perches, MD     Vital Signs: BP (!) 162/94 (BP Location: Left Arm)   Pulse 85   Temp 98.6 F (37 C) (Oral)   Resp 18   Ht 5\' 4"  (1.626 m)   Wt 182 lb (82.6 kg)   SpO2 100%   BMI 31.24 kg/m   Physical Exam awake/alert; rt  PCN intact, dressing clean and dry, site mildly tender, OP 2.3 liters yellow urine  Imaging: CT ABDOMEN PELVIS W CONTRAST  Result Date: 07/09/2022 CLINICAL DATA:  62 year old female with recent sepsis due to right side obstructive uropathy. Status post percutaneous nephrostomy. Continued right flank and upper quadrant pain. Leukocytosis. EXAM: CT ABDOMEN AND PELVIS WITH CONTRAST TECHNIQUE: Multidetector CT imaging of the abdomen and pelvis was performed using the standard protocol following bolus administration of intravenous contrast. RADIATION DOSE REDUCTION: This exam was performed according to the departmental dose-optimization program which includes automated exposure control, adjustment of the mA and/or kV according to patient size and/or use of iterative reconstruction technique. CONTRAST:  OMNIPAQUE IOHEXOL 300 MG/ML  SOLN COMPARISON:  *INSERT* by CT 07/06/2022. FINDINGS: Lower chest: New small layering pleural effusions. Simple fluid density suggesting transudate. Confluent but enhancing bilateral lower lobe atelectasis. No pericardial effusion. Hepatobiliary: Some vicarious contrast excretion to the gallbladder. Otherwise negative liver and gallbladder. Pancreas: Negative. Spleen: Negative. Adrenals/Urinary Tract: Normal adrenal glands. Left kidney stable developing lower pole staghorn type calculus. Left pararenal edema has increased (series 2, image 27), but renal enhancement remains within normal limits. Left ureter not significantly changed. New percutaneous right side nephrostomy since the CT on 07/06/2022. Satisfactory tube placement. Regressed right hydronephrosis. Obstructing stone is now centrally located at the pigtail of the catheter on series 2, image 30, up to 17 mm long axis. The right ureter is relatively decompressed now, but there is periureteral  and retroperitoneal inflammation which tracks to the pelvic inlet. Right renal enhancement is within normal limits. Right pararenal  edema has decreased. On the delayed phase images there is left renal excretion to the ureter. The right nephrogram appears symmetric although no contrast excretion to the ureter. Unremarkable bladder. Stomach/Bowel: No dilated or inflamed bowel. Diverticulosis of both the ascending and sigmoid colon. Normal appendix, coronal image 52. No free air or free fluid. Vascular/Lymphatic: Aortoiliac calcified atherosclerosis. Major arterial structures remain patent. Normal caliber abdominal aorta. Portal venous system timing is early on the initial phase, grossly patent main portal vein on the delayed phase. No lymphadenopathy. Reproductive: Stable, within normal limits. Other: No pelvis free fluid.  Numerous pelvic phleboliths. Musculoskeletal: No acute osseous abnormality identified. Lower lumbar facet degeneration. IMPRESSION: 1. Satisfactory new percutaneous Right nephrostomy since the CT on 07/06/2022. Regressed right hydronephrosis and pararenal edema. The large 17 mm stone now located in the right renal hilum surrounded by the nephrostomy pigtail. Ongoing inflammatory stranding along the right ureter to the pelvis. 2. Mild increased left pararenal edema, nonspecific. Stable lower pole developing staghorn calculus with no left hydronephrosis or evidence of pyelonephritis. 3. New small layering pleural effusions with bilateral lower lobe atelectasis. 4. Normal appendix. Diverticulosis of the large bowel. Aortic Atherosclerosis (ICD10-I70.0). Electronically Signed   By: Odessa Fleming M.D.   On: 07/09/2022 12:46   IR NEPHROSTOMY PLACEMENT RIGHT  Result Date: 07/07/2022 CLINICAL DATA:  Sepsis secondary to obstructing calculus of the right renal pelvis causing hydronephrosis. The patient requires decompression of the right renal collecting system with percutaneous nephrostomy. EXAM: 1. ULTRASOUND GUIDANCE FOR PUNCTURE OF THE RIGHT RENAL COLLECTING SYSTEM. 2. RIGHT PERCUTANEOUS NEPHROSTOMY TUBE PLACEMENT. COMPARISON:  None  Available. ANESTHESIA/SEDATION: Moderate (conscious) sedation was employed during this procedure. A total of Versed 2.0 mg and Fentanyl 100 mcg was administered intravenously. Moderate Sedation Time: 19 minutes. The patient's level of consciousness and vital signs were monitored continuously by radiology nursing throughout the procedure under my direct supervision. CONTRAST:  10 mL Omnipaque 300 MEDICATIONS: None FLUOROSCOPY TIME:  2 minutes and 30 seconds.  46.0 mGy. PROCEDURE: The procedure, risks, benefits, and alternatives were explained to the patient. Questions regarding the procedure were encouraged and answered. The patient understands and consents to the procedure. A time-out was performed prior to initiating the procedure. The right flank region was prepped with Betadine in a sterile fashion, and a sterile drape was applied covering the operative field. A sterile gown and sterile gloves were used for the procedure. Local anesthesia was provided with 1% Lidocaine. Ultrasound was used to localize the right kidney. Under direct ultrasound guidance, a 21 gauge needle was advanced into the renal collecting system. Ultrasound image documentation was performed. Aspiration of urine sample was performed followed by contrast injection. A transitional dilator was advanced over a guidewire. Percutaneous tract dilatation was then performed over the guidewire. A 10-French percutaneous nephrostomy tube was then advanced and formed in the collecting system. A urine sample was aspirated from the nephrostomy tube and sent for culture analysis. Catheter position was confirmed by fluoroscopy after contrast injection. The catheter was secured at the skin with a Prolene retention suture and Stat-Lock device. A gravity bag was placed. COMPLICATIONS: None. FINDINGS: Ultrasound demonstrates moderate hydronephrosis of the right kidney. Fluoroscopy demonstrates retained excreted contrast material within the collecting system and an  obstructing calculus at the level of the renal pelvis and UPJ. After access of the collecting system via the lower pole/interpolar region, a 10 [place]  nephrostomy tube was placed and formed in the renal pelvis. There is return of blood tinged urine. A sample was sent for culture. The nephrostomy tube was attached to a gravity drainage bag. IMPRESSION: Right-sided percutaneous nephrostomy tube placement with placement of a 10 French catheter formed in the renal pelvis. There is return of blood tinged urine. The catheter will be left to gravity bag drainage. A urine sample was sent for culture analysis. Electronically Signed   By: Irish Lack M.D.   On: 07/07/2022 11:31   CT ABDOMEN PELVIS W CONTRAST  Result Date: 07/06/2022 CLINICAL DATA:  Abdominal pain, acute, nonlocalized, vomiting EXAM: CT ABDOMEN AND PELVIS WITH CONTRAST TECHNIQUE: Multidetector CT imaging of the abdomen and pelvis was performed using the standard protocol following bolus administration of intravenous contrast. RADIATION DOSE REDUCTION: This exam was performed according to the departmental dose-optimization program which includes automated exposure control, adjustment of the mA and/or kV according to patient size and/or use of iterative reconstruction technique. CONTRAST:  OMNIPAQUE IOHEXOL 300 MG/ML  SOLN COMPARISON:  08/20/2013 FINDINGS: Lower chest: No acute abnormality.  Small hiatal hernia. Hepatobiliary: Mild hepatomegaly. No enhancing intrahepatic mass. No intra or extrahepatic biliary ductal dilation. Gallbladder unremarkable. Pancreas: Unremarkable Spleen: Normal in size without focal abnormality. Adrenals/Urinary Tract: The adrenal glands are unremarkable. The kidneys are normal in size and position. Moderate right hydronephrosis secondary to an obstructing 10 x 16 x 19 mm calculus within the right ureteropelvic junction. Moderate right perinephric stranding. Delayed right renal cortical enhancement. Superimposed bilateral  nonobstructing nephrolithiasis is seen with calculi within the lower pole measuring up to 9 mm on the right and 10 mm on the left. No hydronephrosis on the left. No additional ureteral calculi. The bladder is unremarkable. Stomach/Bowel: Mild scattered colonic diverticulosis. The stomach, small bowel, and large bowel are otherwise unremarkable. Appendix normal. No evidence of obstruction or focal inflammation. No free intraperitoneal gas or fluid. Vascular/Lymphatic: Aortic atherosclerosis. No enlarged abdominal or pelvic lymph nodes. Reproductive: Uterus and bilateral adnexa are unremarkable. Other: No abdominal wall hernia or abnormality. No abdominopelvic ascites. Musculoskeletal: Degenerative changes are seen within the lumbar spine. No acute bone abnormality. No lytic or blastic bone lesion. IMPRESSION: 1. Obstructing 10 x 16 x 19 mm calculus within the right ureteropelvic junction resulting in moderate right hydronephrosis and delayed right renal cortical enhancement. Superimposed mild bilateral nonobstructing nephrolithiasis. Aortic Atherosclerosis (ICD10-I70.0). Electronically Signed   By: Helyn Numbers M.D.   On: 07/06/2022 22:08   DG Chest 1 View  Result Date: 07/06/2022 CLINICAL DATA:  Shortness of breath, nausea EXAM: CHEST  1 VIEW COMPARISON:  Chest radiograph 03/05/2018 FINDINGS: The cardiomediastinal silhouette is normal There is no focal consolidation or pulmonary edema. There is no pleural effusion or pneumothorax There is no acute osseous abnormality. IMPRESSION: No radiographic evidence of acute cardiopulmonary process. Electronically Signed   By: Lesia Hausen M.D.   On: 07/06/2022 19:25    Labs:  CBC: Recent Labs    07/07/22 0840 07/08/22 0915 07/09/22 0540 07/10/22 0530  WBC 17.0* 20.2* 25.5* 20.6*  HGB 11.5* 10.1* 9.8* 10.3*  HCT 36.7 31.1* 31.0* 31.3*  PLT 177 154 152 204    COAGS: Recent Labs    07/07/22 0632  INR 1.2    BMP: Recent Labs    07/07/22 0840  07/08/22 0915 07/09/22 0540 07/10/22 0530  NA 137 136 137 137  K 3.1* 3.7 4.1 3.5  CL 101 108 109 106  CO2 20* 19* 20* 22  GLUCOSE 163* 147* 113* 112*  BUN 22* 25* 18 12  CALCIUM 7.9* 7.2* 7.9* 8.3*  CREATININE 2.10* 1.46* 1.11* 1.01*  GFRNONAA 27* 41* 57* >60    LIVER FUNCTION TESTS: Recent Labs    08/05/21 1544 02/10/22 1605 05/19/22 1612  BILITOT 0.5 0.3 0.3  AST 20 14 16   ALT 15 14 15   ALKPHOS 86 78 81  PROT 8.1 7.7 8.0  ALBUMIN 4.4 4.2 4.7    Assessment and Plan: Pt with hx sepsis secondary to obstructing calculus rt renal pelvis/hydronephrosis; s/p rt PCN 5/3; afebrile, creat 1.01(1.11), WBC 20.6(25.5), hgb stable, urine cx neg; plans as per urology for eventual PCNL; pt aware of PCN care instructions   Electronically Signed: D. Jeananne Rama, PA-C 07/10/2022, 12:21 PM   I spent a total of 15 Minutes at the the patient's bedside AND on the patient's hospital floor or unit, greater than 50% of which was counseling/coordinating care for right nephrostomy    Patient ID: Caitlin Thompson, female   DOB: 1960/12/11, 62 y.o.   MRN: [id number]

## 2022-07-10 NOTE — Progress Notes (Signed)
PHARMACY NOTE:  ANTIMICROBIAL RENAL DOSAGE ADJUSTMENT  Current antimicrobial regimen includes a mismatch between antimicrobial dosage and estimated renal function. As per policy approved by the Pharmacy & Therapeutics and Medical Executive Committees, the antimicrobial dosage will be adjusted accordingly.  Current antimicrobial and dosage:  Cefepime 2 g q12 hr  Indication: urosepsis/pyelo  Renal Function:   Estimated Creatinine Clearance: 62.4 mL/min (A) (by C-G formula based on SCr of 1.01 mg/dL (H)). []      On intermittent HD, scheduled: []      On CRRT    Antimicrobial dosage has been changed to:  2g IV q8 hr   Additional Comments: n/a   Thank you for allowing pharmacy to be a part of this patient's care.  Bernadene Person, PharmD, BCPS [phone] 07/10/2022, 11:43 AM

## 2022-07-10 NOTE — Progress Notes (Signed)
PROGRESS NOTE  Caitlin Thompson  ZOX:[id number] DOB: 09/09/1960 DOA: 07/06/2022 PCP: Kerri Perches, MD   Brief Narrative: Patient is a 62 year old female with history of hypertension, hyperlipidemia, type 2 Diabetes with A1c of 6.5 as per 3/15 who presented to the emergency room at [facility] with complaint of vomiting, right flank pain.  On presentation she was hypertensive.  Lab work showed WBC count of 15.2, potassium of 3.3, creatinine of 1.1.  CT abdomen/pelvis with contrast showed obstructing 10 x 16 x 19 mm calculus within the right ureteropelvic junction resulting in moderate right hydronephrosis, superimposed mild bilateral nonobstructing nephrolithiasis.  Urology consulted.S/P  IR guided nephrostomy tube placement.  Started on broad spectrum  antibiotics.  Culture sent, no growth till date.  She feels better today, if remains stable, discharge plan to home tomorrow  Assessment & Plan:  Principal Problem:   Sepsis secondary to UTI [facility]) Active Problems:   Hyperlipidemia LDL goal <100   Essential hypertension   Acute pyelonephritis   Type 2 diabetes mellitus with hyperglycemia (HCC)   Nephrolithiasis   Hypokalemia   Prolonged QT interval   Elevated MCV   Severe sepsis (HCC)   Sepsis secondary to nephrolithiasis/possible pyelonephritis: presented with fever, tachycardia, tachypnea, elevated white cell count.  Imaging showed obstructing 10 x 16 x 19 mm calculus within the right ureteropelvic junction resulting in moderate right hydronephrosis, superimposed mild bilateral nonobstructing nephrolithiasis.  Started on IV antibiotics.  Last urine culture 09/24/2020 showed E. coli sensitive to Cipro.  Currently on vancomycin and cefepime.  Urology following.  Status post IR guided right-sided percutaneous nephrostomy tube placement.  Urine culture has being negative.  Blood cultures have not shown any growth yet.  .  Persistent abdominal discomfort on 5/5 so CT abdomen/pelvis was done  which showed regressed right hydronephrosis and pararenal edema. Large 17 mm stone  located in the right renal hilum surrounded by the nephrostomy pigtail. Ongoing inflammatory stranding along the right ureter to the pelvis.. Patient is clinically better today, leukocytosis has improved.  She will be discharged home with nephrostomy tube when she is ready  Nephrolithiasis: CT imaging as above.  Status post IR guided right-sided percutaneous nephrostomy tube placement.  Urology will arrange outpatient follow-up for right PCNL  AKI: resolved  with IV fluid  Coffee-ground emesis: Had a projectile  vomiting with dark-colored material this morning.  Continue Protonix.  Continue to monitor hemoglobin.  Has remained stable.  Drop in the hemoglobin is most likely from hemodilution from fluid  Hypokalemia/hypomagnesemia: Supplemented and corrected  Prolonged QTc: QTc of 516 on presentation.  Potassium supplemented.  Follow-up EKG shows persistent prolonged QTc of 530.  Monitor closely and telemetry.We we will repeat EKG today  Diabetes type 2: A1c of 6.5 as per 3/15.  Currently on sliding scale.  Microcytosis: Vitamin B12 and folate level optimal  Hypertension: Bp up now.  Restarted carvedilol.  Added amlodipine.  Home spironolactone on hold.  Hyperlipidemia: Takes crestor at home  Obesity: BMI of 31.2        DVT prophylaxis:SCDs Start: 07/07/22 0131     Code Status: Full Code  Family Communication: None at the bedside  Patient status:Inpatient  Patient is from :home  Anticipated discharge WJ:XBJY  Estimated DC date: tomorrow   Consultants: Urology  Procedures:  nephrostomy tube placement.  Antimicrobials:  Anti-infectives (From admission, onward)    Start     Dose/Rate Route Frequency Ordered Stop   07/09/22 1800  vancomycin (VANCOCIN) IVPB 1000 mg/200  mL premix        1,000 mg 200 mL/hr over 60 Minutes Intravenous Every 24 hours 07/09/22 1334     07/09/22 0800   vancomycin (VANCOCIN) IVPB 1000 mg/200 mL premix  Status:  Discontinued        1,000 mg 200 mL/hr over 60 Minutes Intravenous Every 48 hours 07/07/22 1004 07/08/22 1111   07/08/22 2000  vancomycin (VANCOREADY) IVPB 1250 mg/250 mL  Status:  Discontinued        1,250 mg 166.7 mL/hr over 90 Minutes Intravenous Every 36 hours 07/07/22 0610 07/07/22 1004   07/08/22 1800  vancomycin (VANCOCIN) IVPB 1000 mg/200 mL premix  Status:  Discontinued        1,000 mg 200 mL/hr over 60 Minutes Intravenous Every 36 hours 07/08/22 1111 07/09/22 1334   07/07/22 0800  ciprofloxacin (CIPRO) IVPB 400 mg  Status:  Discontinued        400 mg 200 mL/hr over 60 Minutes Intravenous Every 12 hours 07/07/22 0211 07/07/22 0910   07/07/22 0600  vancomycin (VANCOCIN) IVPB 1000 mg/200 mL premix        1,000 mg 200 mL/hr over 60 Minutes Intravenous  Once 07/07/22 0549 07/07/22 0805   07/07/22 0600  ceFEPIme (MAXIPIME) 2 g in sodium chloride 0.9 % 100 mL IVPB        2 g 200 mL/hr over 30 Minutes Intravenous Every 12 hours 07/07/22 0549     07/06/22 2245  ciprofloxacin (CIPRO) IVPB 400 mg        400 mg 200 mL/hr over 60 Minutes Intravenous  Once 07/06/22 2231 07/07/22 0205       Subjective: Patient seen and examined at bedside today.  She feels great today.  She wants to advance her diet.  She had a good bowel movement.  Looks much better.  But does not feel ready to go home yet  Objective: Vitals:   07/09/22 0801 07/09/22 1624 07/09/22 2010 07/10/22 0408  BP: (!) 165/89 (!) 166/89 (!) 157/86 (!) 166/83  Pulse: 96 96 99 94  Resp:  16    Temp:  98 F (36.7 C) 98.6 F (37 C) 99 F (37.2 C)  TempSrc:  Oral Oral Oral  SpO2:  94% 97% 94%  Weight:      Height:        Intake/Output Summary (Last 24 hours) at 07/10/2022 1056 Last data filed at 07/10/2022 0847 Gross per 24 hour  Intake 645.07 ml  Output 3900 ml  Net -3254.93 ml   Filed Weights   07/06/22 1831  Weight: 82.6 kg    Examination:   General  exam: Overall comfortable, not in distress,obese HEENT: PERRL Respiratory system:  no wheezes or crackles  Cardiovascular system: S1 & S2 heard, RRR.  Gastrointestinal system: Abdomen is nondistended, soft and nontender.  Right nephrostomy tube Central nervous system: Alert and oriented Extremities: No edema, no clubbing ,no cyanosis Skin: No rashes, no ulcers,no icterus     Data Reviewed: I have personally reviewed following labs and imaging studies  CBC: Recent Labs  Lab 07/06/22 1851 07/07/22 0840 07/08/22 0915 07/09/22 0540 07/10/22 0530  WBC 18.2* 17.0* 20.2* 25.5* 20.6*  NEUTROABS 14.0*  --   --   --   --   HGB 13.2 11.5* 10.1* 9.8* 10.3*  HCT 43.3 36.7 31.1* 31.0* 31.3*  MCV 101.4* 97.9 97.5 98.1 94.8  PLT 246 177 154 152 204   Basic Metabolic Panel: Recent Labs  Lab 07/06/22 1851 07/07/22 0840  07/08/22 0915 07/09/22 0540 07/10/22 0530  NA 135 137 136 137 137  K 3.3* 3.1* 3.7 4.1 3.5  CL 100 101 108 109 106  CO2 19* 20* 19* 20* 22  GLUCOSE 165* 163* 147* 113* 112*  BUN 20 22* 25* 18 12  CREATININE 1.12* 2.10* 1.46* 1.11* 1.01*  CALCIUM 9.2 7.9* 7.2* 7.9* 8.3*  MG  --  1.1* 2.4  --  1.9     Recent Results (from the past 240 hour(s))  Culture, blood (Routine X 2) w Reflex to ID Panel     Status: None (Preliminary result)   Collection Time: 07/07/22  2:47 AM   Specimen: BLOOD  Result Value Ref Range Status   Specimen Description BLOOD BLOOD RIGHT HAND  Final   Special Requests   Final    BOTTLES DRAWN AEROBIC AND ANAEROBIC Blood Culture adequate volume   Culture   Final    NO GROWTH 2 DAYS Performed at [facility], [address], [place], [place] [address]    Report Status PENDING  Incomplete  Culture, blood (Routine X 2) w Reflex to ID Panel     Status: None (Preliminary result)   Collection Time: 07/07/22  2:49 AM   Specimen: BLOOD  Result Value Ref Range Status   Specimen Description BLOOD BLOOD RIGHT WRIST  Final   Special Requests   Final     BOTTLES DRAWN AEROBIC AND ANAEROBIC Blood Culture adequate volume   Culture   Final    NO GROWTH 2 DAYS Performed at [facility], [address] [place], [place] [address]    Report Status PENDING  Incomplete  Resp panel by RT-PCR (RSV, Flu A&B, Covid) Anterior Nasal Swab     Status: None   Collection Time: 07/07/22  6:30 AM   Specimen: Anterior Nasal Swab  Result Value Ref Range Status   SARS Coronavirus 2 by RT PCR NEGATIVE NEGATIVE Final    Comment: (NOTE) SARS-CoV-2 target nucleic acids are NOT DETECTED.  The SARS-CoV-2 RNA is generally detectable in upper respiratory specimens during the acute phase of infection. The lowest concentration of SARS-CoV-2 viral copies this assay can detect is 138 copies/mL. A negative result does not preclude SARS-Cov-2 infection and should not be used as the sole basis for treatment or other patient management decisions. A negative result may occur with  improper specimen collection/handling, submission of specimen other than nasopharyngeal swab, presence of viral mutation(s) within the areas targeted by this assay, and inadequate number of viral copies(<138 copies/mL). A negative result must be combined with clinical observations, patient history, and epidemiological information. The expected result is Negative.  Fact Sheet for Patients:  BloggerCourse.com  Fact Sheet for Healthcare Providers:  SeriousBroker.it  This test is no t yet approved or cleared by the [place] FDA and  has been authorized for detection and/or diagnosis of SARS-CoV-2 by FDA under an Emergency Use Authorization (EUA). This EUA will remain  in effect (meaning this test can be used) for the duration of the COVID-19 declaration under Section 564(b)(1) of the Act, 21 U.S.C.section 360bbb-3(b)(1), unless the authorization is terminated  or revoked sooner.       Influenza A by PCR NEGATIVE NEGATIVE Final    Influenza B by PCR NEGATIVE NEGATIVE Final    Comment: (NOTE) The Xpert Xpress SARS-CoV-2/FLU/RSV plus assay is intended as an aid in the diagnosis of influenza from Nasopharyngeal swab specimens and should not be used as a sole basis for treatment. Nasal washings and  aspirates are unacceptable for Xpert Xpress SARS-CoV-2/FLU/RSV testing.  Fact Sheet for Patients: BloggerCourse.com  Fact Sheet for Healthcare Providers: SeriousBroker.it  This test is not yet approved or cleared by the [place] FDA and has been authorized for detection and/or diagnosis of SARS-CoV-2 by FDA under an Emergency Use Authorization (EUA). This EUA will remain in effect (meaning this test can be used) for the duration of the COVID-19 declaration under Section 564(b)(1) of the Act, 21 U.S.C. section 360bbb-3(b)(1), unless the authorization is terminated or revoked.     Resp Syncytial Virus by PCR NEGATIVE NEGATIVE Final    Comment: (NOTE) Fact Sheet for Patients: BloggerCourse.com  Fact Sheet for Healthcare Providers: SeriousBroker.it  This test is not yet approved or cleared by the [place] FDA and has been authorized for detection and/or diagnosis of SARS-CoV-2 by FDA under an Emergency Use Authorization (EUA). This EUA will remain in effect (meaning this test can be used) for the duration of the COVID-19 declaration under Section 564(b)(1) of the Act, 21 U.S.C. section 360bbb-3(b)(1), unless the authorization is terminated or revoked.  Performed at [facility], [address] [place], [place] [address]   Urine Culture     Status: None   Collection Time: 07/07/22 10:54 AM   Specimen: Kidney  Result Value Ref Range Status   Specimen Description KIDNEY  Final   Special Requests RIGHT  Final   Culture   Final    NO GROWTH Performed at [facility], [address], [place], [place] [address]    Report Status 07/08/2022 FINAL  Final     Radiology Studies: CT ABDOMEN PELVIS W CONTRAST  Result Date: 07/09/2022 CLINICAL DATA:  62 year old female with recent sepsis due to right side obstructive uropathy. Status post percutaneous nephrostomy. Continued right flank and upper quadrant pain. Leukocytosis. EXAM: CT ABDOMEN AND PELVIS WITH CONTRAST TECHNIQUE: Multidetector CT imaging of the abdomen and pelvis was performed using the standard protocol following bolus administration of intravenous contrast. RADIATION DOSE REDUCTION: This exam was performed according to the departmental dose-optimization program which includes automated exposure control, adjustment of the mA and/or kV according to patient size and/or use of iterative reconstruction technique. CONTRAST:  OMNIPAQUE IOHEXOL 300 MG/ML  SOLN COMPARISON:  *INSERT* by CT 07/06/2022. FINDINGS: Lower chest: New small layering pleural effusions. Simple fluid density suggesting transudate. Confluent but enhancing bilateral lower lobe atelectasis. No pericardial effusion. Hepatobiliary: Some vicarious contrast excretion to the gallbladder. Otherwise negative liver and gallbladder. Pancreas: Negative. Spleen: Negative. Adrenals/Urinary Tract: Normal adrenal glands. Left kidney stable developing lower pole staghorn type calculus. Left pararenal edema has increased (series 2, image 27), but renal enhancement remains within normal limits. Left ureter not significantly changed. New percutaneous right side nephrostomy since the CT on 07/06/2022. Satisfactory tube placement. Regressed right hydronephrosis. Obstructing stone is now centrally located at the pigtail of the catheter on series 2, image 30, up to 17 mm long axis. The right ureter is relatively decompressed now, but there is periureteral and retroperitoneal inflammation which tracks to the pelvic inlet. Right renal enhancement is within normal limits. Right pararenal  edema has decreased. On the delayed phase images there is left renal excretion to the ureter. The right nephrogram appears symmetric although no contrast excretion to the ureter. Unremarkable bladder. Stomach/Bowel: No dilated or inflamed bowel. Diverticulosis of both the ascending and sigmoid colon. Normal appendix, coronal image 52. No free air or free fluid. Vascular/Lymphatic: Aortoiliac calcified atherosclerosis. Major arterial structures remain  patent. Normal caliber abdominal aorta. Portal venous system timing is early on the initial phase, grossly patent main portal vein on the delayed phase. No lymphadenopathy. Reproductive: Stable, within normal limits. Other: No pelvis free fluid.  Numerous pelvic phleboliths. Musculoskeletal: No acute osseous abnormality identified. Lower lumbar facet degeneration. IMPRESSION: 1. Satisfactory new percutaneous Right nephrostomy since the CT on 07/06/2022. Regressed right hydronephrosis and pararenal edema. The large 17 mm stone now located in the right renal hilum surrounded by the nephrostomy pigtail. Ongoing inflammatory stranding along the right ureter to the pelvis. 2. Mild increased left pararenal edema, nonspecific. Stable lower pole developing staghorn calculus with no left hydronephrosis or evidence of pyelonephritis. 3. New small layering pleural effusions with bilateral lower lobe atelectasis. 4. Normal appendix. Diverticulosis of the large bowel. Aortic Atherosclerosis (ICD10-I70.0). Electronically Signed   By: Odessa Fleming M.D.   On: 07/09/2022 12:46    Scheduled Meds:  amLODipine  10 mg Oral Daily   carvedilol  3.125 mg Oral BID WC   insulin aspart  0-15 Units Subcutaneous Q4H   pantoprazole  40 mg Oral BID   polyethylene glycol  17 g Oral Daily   senna-docusate  1 tablet Oral BID   sodium chloride flush  5 mL Intracatheter Q8H   Continuous Infusions:  ceFEPime (MAXIPIME) IV 2 g (07/10/22 0552)   vancomycin Stopped (07/09/22 1811)     LOS: 4 days    Burnadette Pop, MD [facility] P5/08/2022, 10:56 AM

## 2022-07-10 NOTE — Progress Notes (Signed)
Subjective: No acute events overnight.  First time meeting patient this morning.  She was extremely pleasant and in good spirits.  Right side percutaneous nephrostomy tube was in place draining clear yellow urine.  Interval improvement in leukocytosis and serum creatinine.  ROS:  Review of Systems  Genitourinary:  Positive for flank pain.  All other systems reviewed and are negative.   Anti-infectives: Anti-infectives (From admission, onward)    Start     Dose/Rate Route Frequency Ordered Stop   07/09/22 1800  vancomycin (VANCOCIN) IVPB 1000 mg/200 mL premix        1,000 mg 200 mL/hr over 60 Minutes Intravenous Every 24 hours 07/09/22 1334     07/09/22 0800  vancomycin (VANCOCIN) IVPB 1000 mg/200 mL premix  Status:  Discontinued        1,000 mg 200 mL/hr over 60 Minutes Intravenous Every 48 hours 07/07/22 1004 07/08/22 1111   07/08/22 2000  vancomycin (VANCOREADY) IVPB 1250 mg/250 mL  Status:  Discontinued        1,250 mg 166.7 mL/hr over 90 Minutes Intravenous Every 36 hours 07/07/22 0610 07/07/22 1004   07/08/22 1800  vancomycin (VANCOCIN) IVPB 1000 mg/200 mL premix  Status:  Discontinued        1,000 mg 200 mL/hr over 60 Minutes Intravenous Every 36 hours 07/08/22 1111 07/09/22 1334   07/07/22 0800  ciprofloxacin (CIPRO) IVPB 400 mg  Status:  Discontinued        400 mg 200 mL/hr over 60 Minutes Intravenous Every 12 hours 07/07/22 0211 07/07/22 0910   07/07/22 0600  vancomycin (VANCOCIN) IVPB 1000 mg/200 mL premix        1,000 mg 200 mL/hr over 60 Minutes Intravenous  Once 07/07/22 0549 07/07/22 0805   07/07/22 0600  ceFEPIme (MAXIPIME) 2 g in sodium chloride 0.9 % 100 mL IVPB        2 g 200 mL/hr over 30 Minutes Intravenous Every 12 hours 07/07/22 0549     07/06/22 2245  ciprofloxacin (CIPRO) IVPB 400 mg        400 mg 200 mL/hr over 60 Minutes Intravenous  Once 07/06/22 2231 07/07/22 0205       Current Facility-Administered Medications  Medication Dose Route  Frequency Provider Last Rate Last Admin   acetaminophen (TYLENOL) tablet 650 mg  650 mg Oral Q6H PRN Adefeso, Oladapo, DO   650 mg at 07/09/22 0807   Or   acetaminophen (TYLENOL) suppository 650 mg  650 mg Rectal Q6H PRN Adefeso, Oladapo, DO   650 mg at 07/07/22 0143   amLODipine (NORVASC) tablet 10 mg  10 mg Oral Daily Adhikari, Amrit, MD       carvedilol (COREG) tablet 3.125 mg  3.125 mg Oral BID WC Adhikari, Amrit, MD   3.125 mg at 07/10/22 0823   ceFEPIme (MAXIPIME) 2 g in sodium chloride 0.9 % 100 mL IVPB  2 g Intravenous Q12H Maurice March, RPH 200 mL/hr at 07/10/22 0552 2 g at 07/10/22 0552   hydrALAZINE (APRESOLINE) injection 10 mg  10 mg Intravenous Q6H PRN Adefeso, Oladapo, DO       HYDROmorphone (DILAUDID) injection 0.5 mg  0.5 mg Intravenous Q3H PRN Adefeso, Oladapo, DO   0.5 mg at 07/10/22 0824   insulin aspart (novoLOG) injection 0-15 Units  0-15 Units Subcutaneous Q4H Adefeso, Oladapo, DO   2 Units at 07/10/22 0830   Oral care mouth rinse  15 mL Mouth Rinse PRN Burnadette Pop, MD  pantoprazole (PROTONIX) EC tablet 40 mg  40 mg Oral BID Herby Abraham, RPH   40 mg at 07/10/22 1610   polyethylene glycol (MIRALAX / GLYCOLAX) packet 17 g  17 g Oral Daily Burnadette Pop, MD   17 g at 07/09/22 1018   prochlorperazine (COMPAZINE) injection 10 mg  10 mg Intravenous Q6H PRN Burnadette Pop, MD   10 mg at 07/09/22 1336   senna-docusate (Senokot-S) tablet 1 tablet  1 tablet Oral BID Burnadette Pop, MD   1 tablet at 07/10/22 9604   sodium chloride flush (NS) 0.9 % injection 5 mL  5 mL Intracatheter Q8H Irish Lack, MD   5 mL at 07/10/22 0559   trimethobenzamide (TIGAN) injection 200 mg  200 mg Intramuscular Q6H PRN Burnadette Pop, MD       vancomycin (VANCOCIN) IVPB 1000 mg/200 mL premix  1,000 mg Intravenous Q24H Herby Abraham, RPH   Stopped at 07/09/22 1811     Objective: Vital signs in last 24 hours: Temp:  [98 F (36.7 C)-99 F (37.2 C)] 99 F (37.2 C) (05/06  0408) Pulse Rate:  [94-99] 94 (05/06 0408) Resp:  [16] 16 (05/05 1624) BP: (157-166)/(83-89) 166/83 (05/06 0408) SpO2:  [94 %-97 %] 94 % (05/06 0408)  Intake/Output from previous day: 05/05 0701 - 05/06 0700 In: 935.1 [P.O.:150; I.V.:385; IV Piggyback:400.1] Out: 4250 [Urine:4250] Intake/Output this shift: Total I/O In: 240 [P.O.:240] Out: 600 [Urine:600]   Physical Exam Vitals reviewed.  Constitutional:      Appearance: Normal appearance.  Abdominal:     Palpations: Abdomen is soft.     Comments: Right NT is draining yellow urine.   Neurological:     Mental Status: She is alert.     Lab Results:  Recent Labs    07/09/22 0540 07/10/22 0530  WBC 25.5* 20.6*  HGB 9.8* 10.3*  HCT 31.0* 31.3*  PLT 152 204    BMET Recent Labs    07/09/22 0540 07/10/22 0530  NA 137 137  K 4.1 3.5  CL 109 106  CO2 20* 22  GLUCOSE 113* 112*  BUN 18 12  CREATININE 1.11* 1.01*  CALCIUM 7.9* 8.3*    PT/INR No results for input(s): "LABPROT", "INR" in the last 72 hours.  ABG No results for input(s): "PHART", "HCO3" in the last 72 hours.  Invalid input(s): "PCO2", "PO2"   Studies/Results: CT ABDOMEN PELVIS W CONTRAST  Result Date: 07/09/2022 CLINICAL DATA:  62 year old female with recent sepsis due to right side obstructive uropathy. Status post percutaneous nephrostomy. Continued right flank and upper quadrant pain. Leukocytosis. EXAM: CT ABDOMEN AND PELVIS WITH CONTRAST TECHNIQUE: Multidetector CT imaging of the abdomen and pelvis was performed using the standard protocol following bolus administration of intravenous contrast. RADIATION DOSE REDUCTION: This exam was performed according to the departmental dose-optimization program which includes automated exposure control, adjustment of the mA and/or kV according to patient size and/or use of iterative reconstruction technique. CONTRAST:  OMNIPAQUE IOHEXOL 300 MG/ML  SOLN COMPARISON:  *INSERT* by CT 07/06/2022. FINDINGS:  Lower chest: New small layering pleural effusions. Simple fluid density suggesting transudate. Confluent but enhancing bilateral lower lobe atelectasis. No pericardial effusion. Hepatobiliary: Some vicarious contrast excretion to the gallbladder. Otherwise negative liver and gallbladder. Pancreas: Negative. Spleen: Negative. Adrenals/Urinary Tract: Normal adrenal glands. Left kidney stable developing lower pole staghorn type calculus. Left pararenal edema has increased (series 2, image 27), but renal enhancement remains within normal limits. Left ureter not significantly changed. New percutaneous right side  nephrostomy since the CT on 07/06/2022. Satisfactory tube placement. Regressed right hydronephrosis. Obstructing stone is now centrally located at the pigtail of the catheter on series 2, image 30, up to 17 mm long axis. The right ureter is relatively decompressed now, but there is periureteral and retroperitoneal inflammation which tracks to the pelvic inlet. Right renal enhancement is within normal limits. Right pararenal edema has decreased. On the delayed phase images there is left renal excretion to the ureter. The right nephrogram appears symmetric although no contrast excretion to the ureter. Unremarkable bladder. Stomach/Bowel: No dilated or inflamed bowel. Diverticulosis of both the ascending and sigmoid colon. Normal appendix, coronal image 52. No free air or free fluid. Vascular/Lymphatic: Aortoiliac calcified atherosclerosis. Major arterial structures remain patent. Normal caliber abdominal aorta. Portal venous system timing is early on the initial phase, grossly patent main portal vein on the delayed phase. No lymphadenopathy. Reproductive: Stable, within normal limits. Other: No pelvis free fluid.  Numerous pelvic phleboliths. Musculoskeletal: No acute osseous abnormality identified. Lower lumbar facet degeneration. IMPRESSION: 1. Satisfactory new percutaneous Right nephrostomy since the CT on  07/06/2022. Regressed right hydronephrosis and pararenal edema. The large 17 mm stone now located in the right renal hilum surrounded by the nephrostomy pigtail. Ongoing inflammatory stranding along the right ureter to the pelvis. 2. Mild increased left pararenal edema, nonspecific. Stable lower pole developing staghorn calculus with no left hydronephrosis or evidence of pyelonephritis. 3. New small layering pleural effusions with bilateral lower lobe atelectasis. 4. Normal appendix. Diverticulosis of the large bowel. Aortic Atherosclerosis (ICD10-I70.0). Electronically Signed   By: Odessa Fleming M.D.   On: 07/09/2022 12:46     Assessment and Plan: 19mm RUPJ stone with obstruction and sepsis  s/p right nephrostomy placement.   Continue current care, but if the leukocytosis and pain persists, repeat CT might be indicated.   She will need to have a right PCNL arranged when she has recovered from the sepsis.  Urology will follow along peripherally      LOS: 4 days    Scherrie Bateman Yusef Lamp 5/6/2024Patient ID: Cecille Po, female   DOB: 12-28-1960, 63 y.o.   MRN: [id number] Patient ID: AZHIA FARNSWORTH, female   DOB: 12-May-1960, 62 y.o.   MRN: [id number]

## 2022-07-11 DIAGNOSIS — A419 Sepsis, unspecified organism: Secondary | ICD-10-CM | POA: Diagnosis not present

## 2022-07-11 DIAGNOSIS — N138 Other obstructive and reflux uropathy: Principal | ICD-10-CM

## 2022-07-11 DIAGNOSIS — N390 Urinary tract infection, site not specified: Secondary | ICD-10-CM | POA: Diagnosis not present

## 2022-07-11 LAB — CBC
HCT: 33.9 % — ABNORMAL LOW (ref 36.0–46.0)
Hemoglobin: 11.1 g/dL — ABNORMAL LOW (ref 12.0–15.0)
MCH: 31.1 pg (ref 26.0–34.0)
MCHC: 32.7 g/dL (ref 30.0–36.0)
MCV: 95 fL (ref 80.0–100.0)
Platelets: 227 10*3/uL (ref 150–400)
RBC: 3.57 MIL/uL — ABNORMAL LOW (ref 3.87–5.11)
RDW: 12.2 % (ref 11.5–15.5)
WBC: 12.6 10*3/uL — ABNORMAL HIGH (ref 4.0–10.5)
nRBC: 0.6 % — ABNORMAL HIGH (ref 0.0–0.2)

## 2022-07-11 LAB — BASIC METABOLIC PANEL
Anion gap: 10 (ref 5–15)
BUN: 14 mg/dL (ref 6–20)
CO2: 23 mmol/L (ref 22–32)
Calcium: 8.3 mg/dL — ABNORMAL LOW (ref 8.9–10.3)
Chloride: 102 mmol/L (ref 98–111)
Creatinine, Ser: 1.01 mg/dL — ABNORMAL HIGH (ref 0.44–1.00)
GFR, Estimated: >60 mL/min (ref >60)
Glucose, Bld: 137 mg/dL — ABNORMAL HIGH (ref 70–99)
Potassium: 3.2 mmol/L — ABNORMAL LOW (ref 3.5–5.1)
Sodium: 135 mmol/L (ref 135–145)

## 2022-07-11 LAB — GLUCOSE, CAPILLARY
Glucose-Capillary: 146 mg/dL — ABNORMAL HIGH (ref 70–99)
Glucose-Capillary: 206 mg/dL — ABNORMAL HIGH (ref 70–99)

## 2022-07-11 LAB — CULTURE, BLOOD (ROUTINE X 2): Special Requests: ADEQUATE

## 2022-07-11 MED ORDER — AMLODIPINE BESYLATE 10 MG PO TABS: 10.0000 mg | ORAL_TABLET | Freq: Every day | ORAL | 0 refills | Status: DC

## 2022-07-11 MED ORDER — DIPHENHYDRAMINE HCL 25 MG PO CAPS: 25.0000 mg | ORAL_CAPSULE | Freq: Four times a day (QID) | ORAL | 0 refills | Status: DC | PRN

## 2022-07-11 MED ORDER — CARVEDILOL 6.25 MG PO TABS: 6.2500 mg | ORAL_TABLET | Freq: Two times a day (BID) | ORAL | 0 refills | Status: DC

## 2022-07-11 MED ORDER — POTASSIUM CHLORIDE CRYS ER 20 MEQ PO TBCR: 40.0000 meq | EXTENDED_RELEASE_TABLET | Freq: Every day | ORAL | 0 refills | Status: DC

## 2022-07-11 MED ORDER — POTASSIUM CHLORIDE CRYS ER 20 MEQ PO TBCR
40.0000 meq | EXTENDED_RELEASE_TABLET | Freq: Once | ORAL | Status: AC
  Administered 2022-07-11: 40 meq via ORAL
  Filled 2022-07-11: qty 2

## 2022-07-11 MED ORDER — PANTOPRAZOLE SODIUM 40 MG PO TBEC: 40.0000 mg | DELAYED_RELEASE_TABLET | Freq: Every day | ORAL | 0 refills | Status: DC

## 2022-07-11 MED ORDER — CARVEDILOL 6.25 MG PO TABS
6.2500 mg | ORAL_TABLET | Freq: Two times a day (BID) | ORAL | Status: DC
  Administered 2022-07-11: 6.25 mg via ORAL
  Filled 2022-07-11: qty 1

## 2022-07-11 MED ORDER — DIPHENHYDRAMINE HCL 25 MG PO CAPS
25.0000 mg | ORAL_CAPSULE | Freq: Four times a day (QID) | ORAL | Status: DC | PRN
  Administered 2022-07-11: 25 mg via ORAL
  Filled 2022-07-11: qty 1

## 2022-07-11 MED ORDER — CEPHALEXIN 500 MG PO CAPS: 500.0000 mg | ORAL_CAPSULE | Freq: Three times a day (TID) | ORAL | 0 refills | Status: AC

## 2022-07-11 NOTE — Progress Notes (Signed)
Subjective: Caitlin Thompson. Pt is still uncomfortable but pain is well managed. She has been able to ambulate and is completing ADL's.  ROS:  Review of Systems  Genitourinary:  Positive for flank pain.  All other systems reviewed and are negative.   Anti-infectives: Anti-infectives (From admission, onward)    Start     Dose/Rate Route Frequency Ordered Stop   07/10/22 1415  ceFEPIme (MAXIPIME) 2 g in sodium chloride 0.9 % 100 mL IVPB        2 g 200 mL/hr over 30 Minutes Intravenous Every 8 hours 07/10/22 1321     07/09/22 1800  vancomycin (VANCOCIN) IVPB 1000 mg/200 mL premix  Status:  Discontinued        1,000 mg 200 mL/hr over 60 Minutes Intravenous Every 24 hours 07/09/22 1334 07/10/22 1439   07/09/22 0800  vancomycin (VANCOCIN) IVPB 1000 mg/200 mL premix  Status:  Discontinued        1,000 mg 200 mL/hr over 60 Minutes Intravenous Every 48 hours 07/07/22 1004 07/08/22 1111   07/08/22 2000  vancomycin (VANCOREADY) IVPB 1250 mg/250 mL  Status:  Discontinued        1,250 mg 166.7 mL/hr over 90 Minutes Intravenous Every 36 hours 07/07/22 0610 07/07/22 1004   07/08/22 1800  vancomycin (VANCOCIN) IVPB 1000 mg/200 mL premix  Status:  Discontinued        1,000 mg 200 mL/hr over 60 Minutes Intravenous Every 36 hours 07/08/22 1111 07/09/22 1334   07/07/22 0800  ciprofloxacin (CIPRO) IVPB 400 mg  Status:  Discontinued        400 mg 200 mL/hr over 60 Minutes Intravenous Every 12 hours 07/07/22 0211 07/07/22 0910   07/07/22 0600  vancomycin (VANCOCIN) IVPB 1000 mg/200 mL premix        1,000 mg 200 mL/hr over 60 Minutes Intravenous  Once 07/07/22 0549 07/07/22 0805   07/07/22 0600  ceFEPIme (MAXIPIME) 2 g in sodium chloride 0.9 % 100 mL IVPB  Status:  Discontinued        2 g 200 mL/hr over 30 Minutes Intravenous Every 12 hours 07/07/22 0549 07/10/22 1321   07/06/22 2245  ciprofloxacin (CIPRO) IVPB 400 mg        400 mg 200 mL/hr over 60 Minutes Intravenous  Once 07/06/22 2231 07/07/22 0205        Current Facility-Administered Medications  Medication Dose Route Frequency Provider Last Rate Last Admin   acetaminophen (TYLENOL) tablet 650 mg  650 mg Oral Q6H PRN Adefeso, Oladapo, DO   650 mg at 07/10/22 2048   Or   acetaminophen (TYLENOL) suppository 650 mg  650 mg Rectal Q6H PRN Adefeso, Oladapo, DO   650 mg at 07/07/22 0143   amLODipine (NORVASC) tablet 10 mg  10 mg Oral Daily Burnadette Pop, MD   10 mg at 07/10/22 1221   carvedilol (COREG) tablet 6.25 mg  6.25 mg Oral BID WC Adhikari, Amrit, MD       ceFEPIme (MAXIPIME) 2 g in sodium chloride 0.9 % 100 mL IVPB  2 g Intravenous Q8H Wofford, Drew A, RPH 200 mL/hr at 07/11/22 0648 2 g at 07/11/22 0648   hydrALAZINE (APRESOLINE) injection 10 mg  10 mg Intravenous Q6H PRN Adefeso, Oladapo, DO       HYDROmorphone (DILAUDID) injection 0.5 mg  0.5 mg Intravenous Q3H PRN Adefeso, Oladapo, DO   0.5 mg at 07/10/22 2048   insulin aspart (novoLOG) injection 0-15 Units  0-15 Units Subcutaneous TID AC & HS Adhikari, Amrit,  MD   2 Units at 07/11/22 0831   Oral care mouth rinse  15 mL Mouth Rinse PRN Burnadette Pop, MD       pantoprazole (PROTONIX) EC tablet 40 mg  40 mg Oral BID Len Childs T, RPH   40 mg at 07/10/22 2048   polyethylene glycol (MIRALAX / GLYCOLAX) packet 17 g  17 g Oral Daily Adhikari, Amrit, MD   17 g at 07/09/22 1018   potassium chloride SA (KLOR-CON M) CR tablet 40 mEq  40 mEq Oral Once Burnadette Pop, MD       prochlorperazine (COMPAZINE) injection 10 mg  10 mg Intravenous Q6H PRN Burnadette Pop, MD   10 mg at 07/11/22 0831   senna-docusate (Senokot-S) tablet 1 tablet  1 tablet Oral BID Burnadette Pop, MD   1 tablet at 07/10/22 2048   sodium chloride flush (NS) 0.9 % injection 5 mL  5 mL Intracatheter Q8H Irish Lack, MD   5 mL at 07/10/22 2310   trimethobenzamide (TIGAN) injection 200 mg  200 mg Intramuscular Q6H PRN Burnadette Pop, MD         Objective: Vital signs in last 24 hours: Temp:  [97.9 F (36.6  C)-98.6 F (37 C)] 98 F (36.7 C) (05/07 0511) Pulse Rate:  [82-89] 82 (05/07 0511) Resp:  [18] 18 (05/06 2047) BP: (154-162)/(85-94) 158/85 (05/07 0511) SpO2:  [96 %-100 %] 96 % (05/07 0511)  Intake/Output from previous day: 05/06 0701 - 05/07 0700 In: 770 [P.O.:760; I.V.:5] Out: 2650 [Urine:2650] Intake/Output this shift: No intake/output data recorded.   Physical Exam Vitals reviewed.  Constitutional:      Appearance: Normal appearance.  Abdominal:     Palpations: Abdomen is soft.     Comments: Right NT is draining yellow urine.   Neurological:     Mental Status: She is alert.     Lab Results:  Recent Labs    07/10/22 0530 07/11/22 0500  WBC 20.6* 12.6*  HGB 10.3* 11.1*  HCT 31.3* 33.9*  PLT 204 227    BMET Recent Labs    07/10/22 0530 07/11/22 0500  NA 137 135  K 3.5 3.2*  CL 106 102  CO2 22 23  GLUCOSE 112* 137*  BUN 12 14  CREATININE 1.01* 1.01*  CALCIUM 8.3* 8.3*    PT/INR No results for input(s): "LABPROT", "INR" in the last 72 hours.  ABG No results for input(s): "PHART", "HCO3" in the last 72 hours.  Invalid input(s): "PCO2", "PO2"   Studies/Results: CT ABDOMEN PELVIS W CONTRAST  Result Date: 07/09/2022 CLINICAL DATA:  62 year old female with recent sepsis due to right side obstructive uropathy. Status post percutaneous nephrostomy. Continued right flank and upper quadrant pain. Leukocytosis. EXAM: CT ABDOMEN AND PELVIS WITH CONTRAST TECHNIQUE: Multidetector CT imaging of the abdomen and pelvis was performed using the standard protocol following bolus administration of intravenous contrast. RADIATION DOSE REDUCTION: This exam was performed according to the departmental dose-optimization program which includes automated exposure control, adjustment of the mA and/or kV according to patient size and/or use of iterative reconstruction technique. CONTRAST:  OMNIPAQUE IOHEXOL 300 MG/ML  SOLN COMPARISON:  *INSERT* by CT 07/06/2022. FINDINGS:  Lower chest: New small layering pleural effusions. Simple fluid density suggesting transudate. Confluent but enhancing bilateral lower lobe atelectasis. No pericardial effusion. Hepatobiliary: Some vicarious contrast excretion to the gallbladder. Otherwise negative liver and gallbladder. Pancreas: Negative. Spleen: Negative. Adrenals/Urinary Tract: Normal adrenal glands. Left kidney stable developing lower pole staghorn type calculus. Left pararenal edema  has increased (series 2, image 27), but renal enhancement remains within normal limits. Left ureter not significantly changed. New percutaneous right side nephrostomy since the CT on 07/06/2022. Satisfactory tube placement. Regressed right hydronephrosis. Obstructing stone is now centrally located at the pigtail of the catheter on series 2, image 30, up to 17 mm long axis. The right ureter is relatively decompressed now, but there is periureteral and retroperitoneal inflammation which tracks to the pelvic inlet. Right renal enhancement is within normal limits. Right pararenal edema has decreased. On the delayed phase images there is left renal excretion to the ureter. The right nephrogram appears symmetric although no contrast excretion to the ureter. Unremarkable bladder. Stomach/Bowel: No dilated or inflamed bowel. Diverticulosis of both the ascending and sigmoid colon. Normal appendix, coronal image 52. No free air or free fluid. Vascular/Lymphatic: Aortoiliac calcified atherosclerosis. Major arterial structures remain patent. Normal caliber abdominal aorta. Portal venous system timing is early on the initial phase, grossly patent main portal vein on the delayed phase. No lymphadenopathy. Reproductive: Stable, within normal limits. Other: No pelvis free fluid.  Numerous pelvic phleboliths. Musculoskeletal: No acute osseous abnormality identified. Lower lumbar facet degeneration. IMPRESSION: 1. Satisfactory new percutaneous Right nephrostomy since the CT on  07/06/2022. Regressed right hydronephrosis and pararenal edema. The large 17 mm stone now located in the right renal hilum surrounded by the nephrostomy pigtail. Ongoing inflammatory stranding along the right ureter to the pelvis. 2. Mild increased left pararenal edema, nonspecific. Stable lower pole developing staghorn calculus with no left hydronephrosis or evidence of pyelonephritis. 3. New small layering pleural effusions with bilateral lower lobe atelectasis. 4. Normal appendix. Diverticulosis of the large bowel. Aortic Atherosclerosis (ICD10-I70.0). Electronically Signed   By: Odessa Fleming M.D.   On: 07/09/2022 12:46     Assessment and Plan: 19mm RUPJ stone with obstruction and sepsis. S/p right nephrostomy placement.   Continue current care, but if the leukocytosis and pain persists, repeat CT might be indicated.   Follow up being scheduled. Reviewed case with Dr. Marlou Porch, conventional retrograde approach and laser lithotripsy may be preferable.  Interval improvement in leukocytosis and Scr is stable.      LOS: 5 days    Scherrie Bateman Makiah Clauson 5/7/2024Patient ID: Cecille Po, female   DOB: Mar 31, 1960, 62 y.o.   MRN: [id number] Patient ID: RASHANNA RAMSAROOP, female   DOB: 01-04-1961, 62 y.o.   MRN: [id number]

## 2022-07-11 NOTE — Discharge Summary (Addendum)
Physician Discharge Summary  Caitlin Thompson:[id number] DOB: [birth year] DOA: 07/06/2022  PCP: Kerri Perches, MD  Admit date: 07/06/2022 Discharge date: 07/11/2022  Admitted From: Home Disposition:  Home  Discharge Condition:Stable CODE STATUS:FULL Diet recommendation:  Carb Modified   Brief/Interim Summary: Patient is a 62 year old female with history of hypertension, hyperlipidemia, type 2 Diabetes with A1c of 6.5 as per 3/15 who presented to the emergency room at [facility] with complaint of vomiting, right flank pain.  On presentation she was hypertensive.  Lab work showed WBC count of 15.2, potassium of 3.3, creatinine of 1.1.  CT abdomen/pelvis with contrast showed obstructing 10 x 16 x 19 mm calculus within the right ureteropelvic junction resulting in moderate right hydronephrosis, superimposed mild bilateral nonobstructing nephrolithiasis.  Urology consulted.S/P  IR guided nephrostomy tube placement.  Started on broad spectrum  antibiotics.  Culture sent, no growth till date.  She remains comfortable today.  Hemodynamically stable, no fever.  Leukocytosis has improved further.  Medically stable for discharge home today with oral antibiotics.  She will follow-up with IR and urology as an outpatient.  Following problems were addressed during the hospitalization:  Sepsis secondary to nephrolithiasis/possible pyelonephritis: presented with fever, tachycardia, tachypnea, elevated white cell count.  Imaging showed obstructing 10 x 16 x 19 mm calculus within the right ureteropelvic junction resulting in moderate right hydronephrosis, superimposed mild bilateral nonobstructing nephrolithiasis.  Started on IV antibiotics.  Last urine culture 09/24/2020 showed E. coli sensitive to Cipro.  Currently on vancomycin and cefepime.  Urology following.  Status post IR guided right-sided percutaneous nephrostomy tube placement.  Urine culture has being negative.  Blood cultures have not shown any  growth yet.  .  Persistent abdominal discomfort on 5/5 so CT abdomen/pelvis was done which showed regressed right hydronephrosis and pararenal edema. Large 17 mm stone  located in the right renal hilum surrounded by the nephrostomy pigtail. Ongoing inflammatory stranding along the right ureter to the pelvis.. Patient is clinically better today, leukocytosis has improved.  She will be discharged home with nephrostomy tube .  Urine culture last time showed pansensitive E. coli, she will be discharged on Keflex   Nephrolithiasis: CT imaging as above.  Status post IR guided right-sided percutaneous nephrostomy tube placement.  Urology will arrange outpatient follow-up for right PCNL   AKI: resolved  with IV fluid   Coffee-ground emesis: Had a projectile  vomiting with dark-colored material  o the day of admission.  Continue Protonix.  Hemoglobin stable  Hypokalemia/hypomagnesemia: Supplemented    Prolonged QTc: QTc of 516 on presentation.  Potassium supplemented.  Follow-up EKG shows persistent prolonged QTc Follow up with PCP in a week and do EKG for QTc check   Diabetes type 2: A1c of 6.5 as per 3/15.  Continue home regimen   Microcytosis: Vitamin B12 and folate level optimal   Hypertension: Restarted carvedilol at higher dose.  Added amlodipine.     Hyperlipidemia: Takes crestor at home   Obesity: BMI of 31.2   Discharge Diagnoses:  Principal Problem:   Sepsis secondary to UTI [facility]) Active Problems:   Hyperlipidemia LDL goal <100   Essential hypertension   Acute pyelonephritis   Type 2 diabetes mellitus with hyperglycemia (HCC)   Nephrolithiasis   Hypokalemia   Prolonged QT interval   Elevated MCV   Severe sepsis (HCC)   Obstructive nephropathy    Discharge Instructions  Discharge Instructions     Diet Carb Modified   Complete by: As directed  Discharge instructions   Complete by: As directed    1)Please take prescribed  medications as instructed 2)Follow up with  your PCP in a week.Do an EKG during the follow-up to check QTc interval.  Also do CBC and BMP tests during the follow up 3)Follow up with urology and interventional disease in the outpatient.  Name and number of the providers have attached 4)Monitor your blood pressure at home   Increase activity slowly   Complete by: As directed    No wound care   Complete by: As directed       Allergies as of 07/11/2022       Reactions   Codeine Nausea And Vomiting   Parlodel [bromocriptine Mesylate]    headache   Penicillins Hives   Sulfa Antibiotics Hives, Nausea And Vomiting, Other (See Comments)   Sulfonamide Derivatives Hives        Medication List     STOP taking these medications    nitrofurantoin (macrocrystal-monohydrate) 100 MG capsule Commonly known as: Macrobid   spironolactone 100 MG tablet Commonly known as: Aldactone       TAKE these medications    amLODipine 10 MG tablet Commonly known as: NORVASC Take 1 tablet (10 mg total) by mouth daily.   carvedilol 6.25 MG tablet Commonly known as: COREG Take 1 tablet (6.25 mg total) by mouth 2 (two) times daily with a meal. What changed:  medication strength how much to take   cephALEXin 500 MG capsule Commonly known as: KEFLEX Take 1 capsule (500 mg total) by mouth 3 (three) times daily for 10 days.   diphenhydrAMINE 25 mg capsule Commonly known as: BENADRYL Take 1 capsule (25 mg total) by mouth every 6 (six) hours as needed for itching or allergies.   insulin NPH-regular Human (70-30) 100 UNIT/ML injection Take 40  units in the morning and take 10 units in the evening What changed:  how much to take how to take this when to take this additional instructions   OneTouch Delica Lancets 33G Misc Three times daily testing dx e11.65   OneTouch Verio test strip Generic drug: glucose blood USE 1 STRIP TO CHECK GLUCOSE THREE TIMES DAILY   pantoprazole 40 MG tablet Commonly known as: PROTONIX Take 1 tablet (40  mg total) by mouth daily.   potassium chloride SA 20 MEQ tablet Commonly known as: KLOR-CON M Take 2 tablets (40 mEq total) by mouth daily for 3 doses. Start taking on: Jul 12, 2022   rosuvastatin 10 MG tablet Commonly known as: Crestor Take 1 tablet (10 mg total) by mouth daily.   sodium chloride flush 0.9 % Soln injection Flush nephrostomy drain once daily with 5 mL of saline        Follow-up Information     Joline Maxcy, MD Follow up.   Specialty: Urology Why: Please contact the office to arrange f/u. Contact information: [address] [place] [address] [phone]         Irish Lack, MD Follow up in 7 week(s).   Specialties: Interventional Radiology, Radiology Why: Patient should expect to hear from schedulers in ~1 month to schedule routine exchange of right PCN. If patient has had lithotripsy and drain removal with Urology by time of expected exchange, routine exchange will not be necessary.  If any questions or concerns, please call [phone] Contact information: [address] [place] [address] [phone]  Allergies  Allergen Reactions   Codeine Nausea And Vomiting   Parlodel [Bromocriptine Mesylate]     headache   Penicillins Hives   Sulfa Antibiotics Hives, Nausea And Vomiting and Other (See Comments)   Sulfonamide Derivatives Hives    Consultations: Urology   Procedures/Studies: CT ABDOMEN PELVIS W CONTRAST  Result Date: 07/09/2022 CLINICAL DATA:  62 year old female with recent sepsis due to right side obstructive uropathy. Status post percutaneous nephrostomy. Continued right flank and upper quadrant pain. Leukocytosis. EXAM: CT ABDOMEN AND PELVIS WITH CONTRAST TECHNIQUE: Multidetector CT imaging of the abdomen and pelvis was performed using the standard protocol following bolus administration of intravenous contrast. RADIATION DOSE REDUCTION: This exam was performed  according to the departmental dose-optimization program which includes automated exposure control, adjustment of the mA and/or kV according to patient size and/or use of iterative reconstruction technique. CONTRAST:  OMNIPAQUE IOHEXOL 300 MG/ML  SOLN COMPARISON:  *INSERT* by CT 07/06/2022. FINDINGS: Lower chest: New small layering pleural effusions. Simple fluid density suggesting transudate. Confluent but enhancing bilateral lower lobe atelectasis. No pericardial effusion. Hepatobiliary: Some vicarious contrast excretion to the gallbladder. Otherwise negative liver and gallbladder. Pancreas: Negative. Spleen: Negative. Adrenals/Urinary Tract: Normal adrenal glands. Left kidney stable developing lower pole staghorn type calculus. Left pararenal edema has increased (series 2, image 27), but renal enhancement remains within normal limits. Left ureter not significantly changed. New percutaneous right side nephrostomy since the CT on 07/06/2022. Satisfactory tube placement. Regressed right hydronephrosis. Obstructing stone is now centrally located at the pigtail of the catheter on series 2, image 30, up to 17 mm long axis. The right ureter is relatively decompressed now, but there is periureteral and retroperitoneal inflammation which tracks to the pelvic inlet. Right renal enhancement is within normal limits. Right pararenal edema has decreased. On the delayed phase images there is left renal excretion to the ureter. The right nephrogram appears symmetric although no contrast excretion to the ureter. Unremarkable bladder. Stomach/Bowel: No dilated or inflamed bowel. Diverticulosis of both the ascending and sigmoid colon. Normal appendix, coronal image 52. No free air or free fluid. Vascular/Lymphatic: Aortoiliac calcified atherosclerosis. Major arterial structures remain patent. Normal caliber abdominal aorta. Portal venous system timing is early on the initial phase, grossly patent main portal vein on the  delayed phase. No lymphadenopathy. Reproductive: Stable, within normal limits. Other: No pelvis free fluid.  Numerous pelvic phleboliths. Musculoskeletal: No acute osseous abnormality identified. Lower lumbar facet degeneration. IMPRESSION: 1. Satisfactory new percutaneous Right nephrostomy since the CT on 07/06/2022. Regressed right hydronephrosis and pararenal edema. The large 17 mm stone now located in the right renal hilum surrounded by the nephrostomy pigtail. Ongoing inflammatory stranding along the right ureter to the pelvis. 2. Mild increased left pararenal edema, nonspecific. Stable lower pole developing staghorn calculus with no left hydronephrosis or evidence of pyelonephritis. 3. New small layering pleural effusions with bilateral lower lobe atelectasis. 4. Normal appendix. Diverticulosis of the large bowel. Aortic Atherosclerosis (ICD10-I70.0). Electronically Signed   By: Odessa Fleming M.D.   On: 07/09/2022 12:46   IR NEPHROSTOMY PLACEMENT RIGHT  Result Date: 07/07/2022 CLINICAL DATA:  Sepsis secondary to obstructing calculus of the right renal pelvis causing hydronephrosis. The patient requires decompression of the right renal collecting system with percutaneous nephrostomy. EXAM: 1. ULTRASOUND GUIDANCE FOR PUNCTURE OF THE RIGHT RENAL COLLECTING SYSTEM. 2. RIGHT PERCUTANEOUS NEPHROSTOMY TUBE PLACEMENT. COMPARISON:  None Available. ANESTHESIA/SEDATION: Moderate (conscious) sedation was employed during this procedure. A total of Versed 2.0 mg and Fentanyl 100 mcg  was administered intravenously. Moderate Sedation Time: 19 minutes. The patient's level of consciousness and vital signs were monitored continuously by radiology nursing throughout the procedure under my direct supervision. CONTRAST:  10 mL Omnipaque 300 MEDICATIONS: None FLUOROSCOPY TIME:  2 minutes and 30 seconds.  46.0 mGy. PROCEDURE: The procedure, risks, benefits, and alternatives were explained to the patient. Questions regarding the  procedure were encouraged and answered. The patient understands and consents to the procedure. A time-out was performed prior to initiating the procedure. The right flank region was prepped with Betadine in a sterile fashion, and a sterile drape was applied covering the operative field. A sterile gown and sterile gloves were used for the procedure. Local anesthesia was provided with 1% Lidocaine. Ultrasound was used to localize the right kidney. Under direct ultrasound guidance, a 21 gauge needle was advanced into the renal collecting system. Ultrasound image documentation was performed. Aspiration of urine sample was performed followed by contrast injection. A transitional dilator was advanced over a guidewire. Percutaneous tract dilatation was then performed over the guidewire. A 10-French percutaneous nephrostomy tube was then advanced and formed in the collecting system. A urine sample was aspirated from the nephrostomy tube and sent for culture analysis. Catheter position was confirmed by fluoroscopy after contrast injection. The catheter was secured at the skin with a Prolene retention suture and Stat-Lock device. A gravity bag was placed. COMPLICATIONS: None. FINDINGS: Ultrasound demonstrates moderate hydronephrosis of the right kidney. Fluoroscopy demonstrates retained excreted contrast material within the collecting system and an obstructing calculus at the level of the renal pelvis and UPJ. After access of the collecting system via the lower pole/interpolar region, a 10 French nephrostomy tube was placed and formed in the renal pelvis. There is return of blood tinged urine. A sample was sent for culture. The nephrostomy tube was attached to a gravity drainage bag. IMPRESSION: Right-sided percutaneous nephrostomy tube placement with placement of a 10 French catheter formed in the renal pelvis. There is return of blood tinged urine. The catheter will be left to gravity bag drainage. A urine sample was sent  for culture analysis. Electronically Signed   By: Irish Lack M.D.   On: 07/07/2022 11:31   CT ABDOMEN PELVIS W CONTRAST  Result Date: 07/06/2022 CLINICAL DATA:  Abdominal pain, acute, nonlocalized, vomiting EXAM: CT ABDOMEN AND PELVIS WITH CONTRAST TECHNIQUE: Multidetector CT imaging of the abdomen and pelvis was performed using the standard protocol following bolus administration of intravenous contrast. RADIATION DOSE REDUCTION: This exam was performed according to the departmental dose-optimization program which includes automated exposure control, adjustment of the mA and/or kV according to patient size and/or use of iterative reconstruction technique. CONTRAST:  OMNIPAQUE IOHEXOL 300 MG/ML  SOLN COMPARISON:  08/20/2013 FINDINGS: Lower chest: No acute abnormality.  Small hiatal hernia. Hepatobiliary: Mild hepatomegaly. No enhancing intrahepatic mass. No intra or extrahepatic biliary ductal dilation. Gallbladder unremarkable. Pancreas: Unremarkable Spleen: Normal in size without focal abnormality. Adrenals/Urinary Tract: The adrenal glands are unremarkable. The kidneys are normal in size and position. Moderate right hydronephrosis secondary to an obstructing 10 x 16 x 19 mm calculus within the right ureteropelvic junction. Moderate right perinephric stranding. Delayed right renal cortical enhancement. Superimposed bilateral nonobstructing nephrolithiasis is seen with calculi within the lower pole measuring up to 9 mm on the right and 10 mm on the left. No hydronephrosis on the left. No additional ureteral calculi. The bladder is unremarkable. Stomach/Bowel: Mild scattered colonic diverticulosis. The stomach, small bowel, and large bowel are otherwise  unremarkable. Appendix normal. No evidence of obstruction or focal inflammation. No free intraperitoneal gas or fluid. Vascular/Lymphatic: Aortic atherosclerosis. No enlarged abdominal or pelvic lymph nodes. Reproductive: Uterus and bilateral adnexa are  unremarkable. Other: No abdominal wall hernia or abnormality. No abdominopelvic ascites. Musculoskeletal: Degenerative changes are seen within the lumbar spine. No acute bone abnormality. No lytic or blastic bone lesion. IMPRESSION: 1. Obstructing 10 x 16 x 19 mm calculus within the right ureteropelvic junction resulting in moderate right hydronephrosis and delayed right renal cortical enhancement. Superimposed mild bilateral nonobstructing nephrolithiasis. Aortic Atherosclerosis (ICD10-I70.0). Electronically Signed   By: Helyn Numbers M.D.   On: 07/06/2022 22:08   DG Chest 1 View  Result Date: 07/06/2022 CLINICAL DATA:  Shortness of breath, nausea EXAM: CHEST  1 VIEW COMPARISON:  Chest radiograph 03/05/2018 FINDINGS: The cardiomediastinal silhouette is normal There is no focal consolidation or pulmonary edema. There is no pleural effusion or pneumothorax There is no acute osseous abnormality. IMPRESSION: No radiographic evidence of acute cardiopulmonary process. Electronically Signed   By: Lesia Hausen M.D.   On: 07/06/2022 19:25      Subjective: Patient seen and examined at bedside today.  Hemodynamically stable.  Medically stable for discharge today.  Discharge Exam: Vitals:   07/11/22 0511 07/11/22 1151  BP: (!) 158/85 (!) 146/89  Pulse: 82 88  Resp:  18  Temp: 98 F (36.7 C) 99.1 F (37.3 C)  SpO2: 96% 100%   Vitals:   07/10/22 1153 07/10/22 2047 07/11/22 0511 07/11/22 1151  BP: (!) 162/94 (!) 154/87 (!) 158/85 (!) 146/89  Pulse: 85 89 82 88  Resp: 18 18  18   Temp: 98.6 F (37 C) 97.9 F (36.6 C) 98 F (36.7 C) 99.1 F (37.3 C)  TempSrc: Oral Oral Oral Oral  SpO2: 100% 100% 96% 100%  Weight:      Height:        General: Pt is alert, awake, not in acute distress Cardiovascular: RRR, S1/S2 +, no rubs, no gallops Respiratory: CTA bilaterally, no wheezing, no rhonchi Abdominal: Soft, NT, ND, bowel sounds +, right nephrostomy tube Extremities: no edema, no  cyanosis    The results of significant diagnostics from this hospitalization (including imaging, microbiology, ancillary and laboratory) are listed below for reference.     Microbiology: Recent Results (from the past 240 hour(s))  Culture, blood (Routine X 2) w Reflex to ID Panel     Status: None (Preliminary result)   Collection Time: 07/07/22  2:47 AM   Specimen: BLOOD  Result Value Ref Range Status   Specimen Description BLOOD BLOOD RIGHT HAND  Final   Special Requests   Final    BOTTLES DRAWN AEROBIC AND ANAEROBIC Blood Culture adequate volume   Culture   Final    NO GROWTH 4 DAYS Performed at [facility], [address], [place], [place] [address]    Report Status PENDING  Incomplete  Culture, blood (Routine X 2) w Reflex to ID Panel     Status: None (Preliminary result)   Collection Time: 07/07/22  2:49 AM   Specimen: BLOOD  Result Value Ref Range Status   Specimen Description BLOOD BLOOD RIGHT WRIST  Final   Special Requests   Final    BOTTLES DRAWN AEROBIC AND ANAEROBIC Blood Culture adequate volume   Culture   Final    NO GROWTH 4 DAYS Performed at [facility], [address], [place], [place] [address]    Report Status PENDING  Incomplete  Resp panel  by RT-PCR (RSV, Flu A&B, Covid) Anterior Nasal Swab     Status: None   Collection Time: 07/07/22  6:30 AM   Specimen: Anterior Nasal Swab  Result Value Ref Range Status   SARS Coronavirus 2 by RT PCR NEGATIVE NEGATIVE Final    Comment: (NOTE) SARS-CoV-2 target nucleic acids are NOT DETECTED.  The SARS-CoV-2 RNA is generally detectable in upper respiratory specimens during the acute phase of infection. The lowest concentration of SARS-CoV-2 viral copies this assay can detect is 138 copies/mL. A negative result does not preclude SARS-Cov-2 infection and should not be used as the sole basis for treatment or other patient management decisions. A negative result may occur with  improper specimen collection/handling,  submission of specimen other than nasopharyngeal swab, presence of viral mutation(s) within the areas targeted by this assay, and inadequate number of viral copies(<138 copies/mL). A negative result must be combined with clinical observations, patient history, and epidemiological information. The expected result is Negative.  Fact Sheet for Patients:  BloggerCourse.com  Fact Sheet for Healthcare Providers:  SeriousBroker.it  This test is no t yet approved or cleared by the [place] FDA and  has been authorized for detection and/or diagnosis of SARS-CoV-2 by FDA under an Emergency Use Authorization (EUA). This EUA will remain  in effect (meaning this test can be used) for the duration of the COVID-19 declaration under Section 564(b)(1) of the Act, 21 U.S.C.section 360bbb-3(b)(1), unless the authorization is terminated  or revoked sooner.       Influenza A by PCR NEGATIVE NEGATIVE Final   Influenza B by PCR NEGATIVE NEGATIVE Final    Comment: (NOTE) The Xpert Xpress SARS-CoV-2/FLU/RSV plus assay is intended as an aid in the diagnosis of influenza from Nasopharyngeal swab specimens and should not be used as a sole basis for treatment. Nasal washings and aspirates are unacceptable for Xpert Xpress SARS-CoV-2/FLU/RSV testing.  Fact Sheet for Patients: BloggerCourse.com  Fact Sheet for Healthcare Providers: SeriousBroker.it  This test is not yet approved or cleared by the [place] FDA and has been authorized for detection and/or diagnosis of SARS-CoV-2 by FDA under an Emergency Use Authorization (EUA). This EUA will remain in effect (meaning this test can be used) for the duration of the COVID-19 declaration under Section 564(b)(1) of the Act, 21 U.S.C. section 360bbb-3(b)(1), unless the authorization is terminated or revoked.     Resp Syncytial Virus by PCR NEGATIVE  NEGATIVE Final    Comment: (NOTE) Fact Sheet for Patients: BloggerCourse.com  Fact Sheet for Healthcare Providers: SeriousBroker.it  This test is not yet approved or cleared by the [place] FDA and has been authorized for detection and/or diagnosis of SARS-CoV-2 by FDA under an Emergency Use Authorization (EUA). This EUA will remain in effect (meaning this test can be used) for the duration of the COVID-19 declaration under Section 564(b)(1) of the Act, 21 U.S.C. section 360bbb-3(b)(1), unless the authorization is terminated or revoked.  Performed at [facility], [address], [place], [place] [address]   Urine Culture     Status: None   Collection Time: 07/07/22 10:54 AM   Specimen: Kidney  Result Value Ref Range Status   Specimen Description KIDNEY  Final   Special Requests RIGHT  Final   Culture   Final    NO GROWTH Performed at [facility], [address], [place], [place] [address]    Report Status 07/08/2022 FINAL  Final     Labs: BNP (last 3 results)  No results for input(s): "BNP" in the last 8760 hours. Basic Metabolic Panel: Recent Labs  Lab 07/07/22 0840 07/08/22 0915 07/09/22 0540 07/10/22 0530 07/11/22 0500  NA 137 136 137 137 135  K 3.1* 3.7 4.1 3.5 3.2*  CL 101 108 109 106 102  CO2 20* 19* 20* 22 23  GLUCOSE 163* 147* 113* 112* 137*  BUN 22* 25* 18 12 14   CREATININE 2.10* 1.46* 1.11* 1.01* 1.01*  CALCIUM 7.9* 7.2* 7.9* 8.3* 8.3*  MG 1.1* 2.4  --  1.9  --    Liver Function Tests: No results for input(s): "AST", "ALT", "ALKPHOS", "BILITOT", "PROT", "ALBUMIN" in the last 168 hours. Recent Labs  Lab 07/06/22 1948  LIPASE 28   No results for input(s): "AMMONIA" in the last 168 hours. CBC: Recent Labs  Lab 07/06/22 1851 07/07/22 0840 07/08/22 0915 07/09/22 0540 07/10/22 0530 07/11/22 0500  WBC 18.2* 17.0* 20.2* 25.5* 20.6* 12.6*  NEUTROABS 14.0*  --   --    --   --   --   HGB 13.2 11.5* 10.1* 9.8* 10.3* 11.1*  HCT 43.3 36.7 31.1* 31.0* 31.3* 33.9*  MCV 101.4* 97.9 97.5 98.1 94.8 95.0  PLT 246 177 154 152 204 227   Cardiac Enzymes: No results for input(s): "CKTOTAL", "CKMB", "CKMBINDEX", "TROPONINI" in the last 168 hours. BNP: Invalid input(s): "POCBNP" CBG: Recent Labs  Lab 07/10/22 1150 07/10/22 1550 07/10/22 2151 07/11/22 0751 07/11/22 1147  GLUCAP 248* 107* 155* 146* 206*   D-Dimer No results for input(s): "DDIMER" in the last 72 hours. Hgb A1c No results for input(s): "HGBA1C" in the last 72 hours. Lipid Profile No results for input(s): "CHOL", "HDL", "LDLCALC", "TRIG", "CHOLHDL", "LDLDIRECT" in the last 72 hours. Thyroid function studies No results for input(s): "TSH", "T4TOTAL", "T3FREE", "THYROIDAB" in the last 72 hours.  Invalid input(s): "FREET3" Anemia work up No results for input(s): "VITAMINB12", "FOLATE", "FERRITIN", "TIBC", "IRON", "RETICCTPCT" in the last 72 hours. Urinalysis    Component Value Date/Time   COLORURINE RED (A) 07/07/2022 1054   APPEARANCEUR TURBID (A) 07/07/2022 1054   LABSPEC  07/07/2022 1054    TEST NOT REPORTED DUE TO COLOR INTERFERENCE OF URINE PIGMENT   PHURINE  07/07/2022 1054    TEST NOT REPORTED DUE TO COLOR INTERFERENCE OF URINE PIGMENT   GLUCOSEU (A) 07/07/2022 1054    TEST NOT REPORTED DUE TO COLOR INTERFERENCE OF URINE PIGMENT   HGBUR (A) 07/07/2022 1054    TEST NOT REPORTED DUE TO COLOR INTERFERENCE OF URINE PIGMENT   HGBUR moderate 12/01/2009 1614   BILIRUBINUR (A) 07/07/2022 1054    TEST NOT REPORTED DUE TO COLOR INTERFERENCE OF URINE PIGMENT   BILIRUBINUR negative 02/10/2022 1541   BILIRUBINUR neg 07/11/2017 1649   KETONESUR (A) 07/07/2022 1054    TEST NOT REPORTED DUE TO COLOR INTERFERENCE OF URINE PIGMENT   PROTEINUR (A) 07/07/2022 1054    TEST NOT REPORTED DUE TO COLOR INTERFERENCE OF URINE PIGMENT   UROBILINOGEN 0.2 02/10/2022 1541   UROBILINOGEN 0.2 08/20/2013 1457    NITRITE (A) 07/07/2022 1054    TEST NOT REPORTED DUE TO COLOR INTERFERENCE OF URINE PIGMENT   LEUKOCYTESUR (A) 07/07/2022 1054    TEST NOT REPORTED DUE TO COLOR INTERFERENCE OF URINE PIGMENT   Sepsis Labs Recent Labs  Lab 07/08/22 0915 07/09/22 0540 07/10/22 0530 07/11/22 0500  WBC 20.2* 25.5* 20.6* 12.6*   Microbiology Recent Results (from the past 240 hour(s))  Culture, blood (Routine X 2) w Reflex to ID Panel  Status: None (Preliminary result)   Collection Time: 07/07/22  2:47 AM   Specimen: BLOOD  Result Value Ref Range Status   Specimen Description BLOOD BLOOD RIGHT HAND  Final   Special Requests   Final    BOTTLES DRAWN AEROBIC AND ANAEROBIC Blood Culture adequate volume   Culture   Final    NO GROWTH 4 DAYS Performed at [facility], [address], [place], [place] [address]    Report Status PENDING  Incomplete  Culture, blood (Routine X 2) w Reflex to ID Panel     Status: None (Preliminary result)   Collection Time: 07/07/22  2:49 AM   Specimen: BLOOD  Result Value Ref Range Status   Specimen Description BLOOD BLOOD RIGHT WRIST  Final   Special Requests   Final    BOTTLES DRAWN AEROBIC AND ANAEROBIC Blood Culture adequate volume   Culture   Final    NO GROWTH 4 DAYS Performed at [facility], [address], [place], [place] [address]    Report Status PENDING  Incomplete  Resp panel by RT-PCR (RSV, Flu A&B, Covid) Anterior Nasal Swab     Status: None   Collection Time: 07/07/22  6:30 AM   Specimen: Anterior Nasal Swab  Result Value Ref Range Status   SARS Coronavirus 2 by RT PCR NEGATIVE NEGATIVE Final    Comment: (NOTE) SARS-CoV-2 target nucleic acids are NOT DETECTED.  The SARS-CoV-2 RNA is generally detectable in upper respiratory specimens during the acute phase of infection. The lowest concentration of SARS-CoV-2 viral copies this assay can detect is 138 copies/mL. A negative result does not preclude SARS-Cov-2 infection and should not be used as  the sole basis for treatment or other patient management decisions. A negative result may occur with  improper specimen collection/handling, submission of specimen other than nasopharyngeal swab, presence of viral mutation(s) within the areas targeted by this assay, and inadequate number of viral copies(<138 copies/mL). A negative result must be combined with clinical observations, patient history, and epidemiological information. The expected result is Negative.  Fact Sheet for Patients:  BloggerCourse.com  Fact Sheet for Healthcare Providers:  SeriousBroker.it  This test is no t yet approved or cleared by the [place] FDA and  has been authorized for detection and/or diagnosis of SARS-CoV-2 by FDA under an Emergency Use Authorization (EUA). This EUA will remain  in effect (meaning this test can be used) for the duration of the COVID-19 declaration under Section 564(b)(1) of the Act, 21 U.S.C.section 360bbb-3(b)(1), unless the authorization is terminated  or revoked sooner.       Influenza A by PCR NEGATIVE NEGATIVE Final   Influenza B by PCR NEGATIVE NEGATIVE Final    Comment: (NOTE) The Xpert Xpress SARS-CoV-2/FLU/RSV plus assay is intended as an aid in the diagnosis of influenza from Nasopharyngeal swab specimens and should not be used as a sole basis for treatment. Nasal washings and aspirates are unacceptable for Xpert Xpress SARS-CoV-2/FLU/RSV testing.  Fact Sheet for Patients: BloggerCourse.com  Fact Sheet for Healthcare Providers: SeriousBroker.it  This test is not yet approved or cleared by the [place] FDA and has been authorized for detection and/or diagnosis of SARS-CoV-2 by FDA under an Emergency Use Authorization (EUA). This EUA will remain in effect (meaning this test can be used) for the duration of the COVID-19 declaration under Section 564(b)(1) of  the Act, 21 U.S.C. section 360bbb-3(b)(1), unless the authorization is terminated or revoked.     Resp Syncytial Virus by PCR NEGATIVE  NEGATIVE Final    Comment: (NOTE) Fact Sheet for Patients: BloggerCourse.com  Fact Sheet for Healthcare Providers: SeriousBroker.it  This test is not yet approved or cleared by the [place] FDA and has been authorized for detection and/or diagnosis of SARS-CoV-2 by FDA under an Emergency Use Authorization (EUA). This EUA will remain in effect (meaning this test can be used) for the duration of the COVID-19 declaration under Section 564(b)(1) of the Act, 21 U.S.C. section 360bbb-3(b)(1), unless the authorization is terminated or revoked.  Performed at [facility], [address] [place], [place] [address]   Urine Culture     Status: None   Collection Time: 07/07/22 10:54 AM   Specimen: Kidney  Result Value Ref Range Status   Specimen Description KIDNEY  Final   Special Requests RIGHT  Final   Culture   Final    NO GROWTH Performed at [facility], [address] [place], [place] [address]    Report Status 07/08/2022 FINAL  Final    Please note: You were cared for by a hospitalist during your hospital stay. Once you are discharged, your primary care physician will handle any further medical issues. Please note that NO REFILLS for any discharge medications will be authorized once you are discharged, as it is imperative that you return to your primary care physician (or establish a relationship with a primary care physician if you do not have one) for your post hospital discharge needs so that they can reassess your need for medications and monitor your lab values.    Time coordinating discharge: 40 minutes  SIGNED:   Burnadette Pop, MD  [facility] 07/11/2022, 2:58 PM Pager [phone]  If 7PM-7AM, please contact  night-coverage [email or web address] Password [email or web address]

## 2022-07-12 ENCOUNTER — Encounter: Payer: Self-pay | Admitting: *Deleted

## 2022-07-12 ENCOUNTER — Telehealth: Payer: Self-pay | Admitting: *Deleted

## 2022-07-12 LAB — CULTURE, BLOOD (ROUTINE X 2)
Culture: NO GROWTH
Special Requests: ADEQUATE

## 2022-07-12 NOTE — Transitions of Care (Post Inpatient/ED Visit) (Signed)
07/12/2022  Name: Caitlin Thompson MRN: [id number] DOB: 20-Sep-1960  Today's TOC FU Call Status: Today's TOC FU Call Status:: Successful TOC FU Call Competed TOC FU Call Complete Date: 07/12/22  Transition Care Management Follow-up Telephone Call Date of Discharge: 07/11/22 Discharge Facility: [facility]) Type of Discharge: Inpatient Admission Primary Inpatient Discharge Diagnosis:: sepsis secondary to UTI How have you been since you were released from the hospital?: Better Any questions or concerns?: No  Items Reviewed: Did you receive and understand the discharge instructions provided?: Yes Medications obtained,verified, and reconciled?: Yes (Medications Reviewed) (Keflex and 3 doses of potassium) Any new allergies since your discharge?: No Dietary orders reviewed?: Yes Type of Diet Ordered:: carb modified Do you have support at home?: Yes People in Home: child(ren), adult Name of Support/Comfort Primary Source: [place]  Medications Reviewed Today: Medications Reviewed Today     Reviewed by Gwenith Daily, RN (Registered Nurse) on 07/12/22 at 1514  Med List Status: <None>   Medication Order Taking? Sig Documenting Provider Last Dose Status Informant  amLODipine (NORVASC) 10 MG tablet [id number]  Take 1 tablet (10 mg total) by mouth daily. Burnadette Pop, MD  Active   carvedilol (COREG) 6.25 MG tablet [id number]  Take 1 tablet (6.25 mg total) by mouth 2 (two) times daily with a meal. Burnadette Pop, MD  Active   cephALEXin (KEFLEX) 500 MG capsule [id number]  Take 1 capsule (500 mg total) by mouth 3 (three) times daily for 10 days. Burnadette Pop, MD  Active   diphenhydrAMINE (BENADRYL) 25 mg capsule [id number]  Take 1 capsule (25 mg total) by mouth every 6 (six) hours as needed for itching or allergies. Burnadette Pop, MD  Active   glucose blood [facility] VERIO) test strip [id number] No USE 1 STRIP TO CHECK GLUCOSE THREE TIMES DAILY Kerri Perches, MD Taking Active  Self, Pharmacy Records  insulin NPH-regular Human (70-30) 100 UNIT/ML injection [id number] No Take 40  units in the morning and take 10 units in the evening  Patient taking differently: Inject 30 Units into the skin daily.   Kerri Perches, MD 07/06/2022 Active Self, Pharmacy Records           Med Note Jola Schmidt   Fri Jul 07, 2022  8:35 AM) No dispense records. Could not find on DrFirst either.   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 Burnadette Pop, MD  Active   potassium chloride SA (KLOR-CON M) 20 MEQ tablet [id number]  Take 2 tablets (40 mEq total) by mouth daily for 3 doses. Burnadette Pop, MD  Active   rosuvastatin (CRESTOR) 10 MG tablet [id number] No Take 1 tablet (10 mg total) by mouth daily. Kerri Perches, MD Past Week Active Self, Pharmacy Records           Med Note Jola Schmidt   Fri Jul 07, 2022  8:36 AM) No dispense records.   sodium chloride flush 0.9 % SOLN injection [id number]  Flush nephrostomy drain once daily with 5 mL of saline Kennieth Francois, PA  Active             Home Care and Equipment/Supplies: Were Home Health Services Ordered?: No Any new equipment or medical supplies ordered?: No  Functional Questionnaire: Do you need assistance with bathing/showering or dressing?: No Do  you need assistance with meal preparation?: No Do you need assistance with eating?: No Do you have difficulty maintaining continence: No Do you need assistance with getting out of bed/getting out of a chair/moving?: No Do you have difficulty managing or taking your medications?: No  Follow up appointments reviewed: PCP Follow-up appointment confirmed?: Yes Date of PCP follow-up appointment?: 08/18/22 Follow-up Provider: Dr Lodema Hong (Care guides will call patient to  schedule a one week f/u with [facility]. Will need repeat labs and EKG at that time.) Specialist Hospital Follow-up appointment confirmed?: Yes Date of Specialist follow-up appointment?: 08/25/22 Follow-Up Specialty Provider:: [facility] (Patient has contact number for Dr Margo Aye (urologist) and will call to schedule) Do you need transportation to your follow-up appointment?: No Do you understand care options if your condition(s) worsen?: Yes-patient verbalized understanding  SDOH Interventions Today    Flowsheet Row Most Recent Value  SDOH Interventions   Housing Interventions Intervention Not Indicated  Transportation Interventions Intervention Not Indicated      TOC Interventions Today    Flowsheet Row Most Recent Value  TOC Interventions   TOC Interventions Discussed/Reviewed TOC Interventions Discussed, TOC Interventions Reviewed, Arranged PCP follow up within 7 days/Care Guide scheduled, S/S of infection      Demetrios Loll, BSN, RN-BC RN Care Coordinator [facility]  [facility] Direct Dial: [phone] Main #: [phone]

## 2022-07-13 ENCOUNTER — Other Ambulatory Visit: Payer: Self-pay | Admitting: Urology

## 2022-07-15 NOTE — ED Provider Notes (Signed)
[place] 4TH FLOOR PROGRESSIVE CARE AND UROLOGY Provider Note   CSN: [id number] Arrival date & time: 07/06/22  1758     History  Chief Complaint  Patient presents with   Emesis    Caitlin Thompson is a 62 y.o. female.  Patient complains of vomiting and right flank pain.  Patient has a history of diabetes and hypertension  The history is provided by the patient and medical records. No language interpreter was used.  Emesis Severity:  Moderate Timing:  Constant Quality:  Stomach contents Able to tolerate:  Liquids Progression:  Worsening Chronicity:  Recurrent Recent urination:  Normal Relieved by:  Nothing Worsened by:  Nothing Associated symptoms: no abdominal pain, no cough, no diarrhea and no headaches        Home Medications Prior to Admission medications   Medication Sig Start Date End Date Taking? Authorizing Provider  cephALEXin (KEFLEX) 500 MG capsule Take 1 capsule (500 mg total) by mouth 3 (three) times daily for 10 days. 07/11/22 07/21/22 Yes Burnadette Pop, MD  insulin NPH-regular Human (70-30) 100 UNIT/ML injection Take 40  units in the morning and take 10 units in the evening Patient taking differently: Inject 30 Units into the skin daily. 08/05/21  Yes Kerri Perches, MD  rosuvastatin (CRESTOR) 10 MG tablet Take 1 tablet (10 mg total) by mouth daily. 09/28/20  Yes Kerri Perches, MD  sodium chloride flush 0.9 % SOLN injection Flush nephrostomy drain once daily with 5 mL of saline 07/08/22  Yes Kennieth Francois, PA  amLODipine (NORVASC) 10 MG tablet Take 1 tablet (10 mg total) by mouth daily. 07/11/22   Burnadette Pop, MD  carvedilol (COREG) 6.25 MG tablet Take 1 tablet (6.25 mg total) by mouth 2 (two) times daily with a meal. 07/11/22   Burnadette Pop, MD  diphenhydrAMINE (BENADRYL) 25 mg capsule Take 1 capsule (25 mg total) by mouth every 6 (six) hours as needed for itching or allergies. 07/11/22   Burnadette Pop, MD  glucose blood (ONETOUCH VERIO)  test strip USE 1 STRIP TO CHECK GLUCOSE THREE TIMES DAILY 05/17/22   Kerri Perches, MD  OneTouch Delica Lancets 33G MISC Three times daily testing dx e11.65 03/24/20   Kerri Perches, MD  pantoprazole (PROTONIX) 40 MG tablet Take 1 tablet (40 mg total) by mouth daily. 07/11/22   Burnadette Pop, MD  potassium chloride SA (KLOR-CON M) 20 MEQ tablet Take 2 tablets (40 mEq total) by mouth daily for 3 doses. 07/12/22 07/15/22  Burnadette Pop, MD      Allergies    Codeine, Parlodel [bromocriptine mesylate], Penicillins, Sulfa antibiotics, and Sulfonamide derivatives    Review of Systems   Review of Systems  Constitutional:  Negative for appetite change and fatigue.  HENT:  Negative for congestion, ear discharge and sinus pressure.   Eyes:  Negative for discharge.  Respiratory:  Negative for cough.   Cardiovascular:  Negative for chest pain.  Gastrointestinal:  Positive for vomiting. Negative for abdominal pain and diarrhea.  Genitourinary:  Negative for frequency and hematuria.  Musculoskeletal:  Negative for back pain.       Right flank pain  Skin:  Negative for rash.  Neurological:  Negative for seizures and headaches.  Psychiatric/Behavioral:  Negative for hallucinations.     Physical Exam Updated Vital Signs BP (!) 146/89 (BP Location: Left Arm)   Pulse 88   Temp 99.1 F (37.3 C) (Oral)   Resp 18   Ht 5\' 4"  (1.626 m)  Wt 82.6 kg   SpO2 100%   BMI 31.24 kg/m  Physical Exam Vitals reviewed.  Constitutional:      Appearance: She is well-developed. She is ill-appearing.  HENT:     Head: Normocephalic.     Nose: Nose normal.  Eyes:     General: No scleral icterus.    Conjunctiva/sclera: Conjunctivae normal.  Neck:     Thyroid: No thyromegaly.  Cardiovascular:     Rate and Rhythm: Normal rate and regular rhythm.     Heart sounds: No murmur heard.    No friction rub. No gallop.  Pulmonary:     Breath sounds: No stridor. No wheezing or rales.  Chest:     Chest  wall: No tenderness.  Abdominal:     General: There is no distension.     Tenderness: There is no abdominal tenderness. There is no rebound.  Musculoskeletal:        General: Normal range of motion.     Cervical back: Neck supple.  Lymphadenopathy:     Cervical: No cervical adenopathy.  Skin:    Findings: No erythema or rash.  Neurological:     Mental Status: She is oriented to person, place, and time.     Motor: No abnormal muscle tone.     Coordination: Coordination normal.  Psychiatric:        Behavior: Behavior normal.     ED Results / Procedures / Treatments   Labs (all labs ordered are listed, but only abnormal results are displayed) Labs Reviewed  CBC WITH DIFFERENTIAL/PLATELET - Abnormal; Notable for the following components:      Result Value   WBC 18.2 (*)    MCV 101.4 (*)    Neutro Abs 14.0 (*)    Monocytes Absolute 1.2 (*)    Abs Immature Granulocytes 0.09 (*)    All other components within normal limits  BASIC METABOLIC PANEL - Abnormal; Notable for the following components:   Potassium 3.3 (*)    CO2 19 (*)    Glucose, Bld 165 (*)    Creatinine, Ser 1.12 (*)    GFR, Estimated 57 (*)    Anion gap 16 (*)    All other components within normal limits  URINALYSIS, ROUTINE W REFLEX MICROSCOPIC - Abnormal; Notable for the following components:   Color, Urine STRAW (*)    Glucose, UA 50 (*)    Hgb urine dipstick SMALL (*)    Ketones, ur 5 (*)    Protein, ur 30 (*)    All other components within normal limits  BLOOD GAS, VENOUS - Abnormal; Notable for the following components:   pH, Ven 7.47 (*)    pCO2, Ven 34 (*)    All other components within normal limits  CBC - Abnormal; Notable for the following components:   WBC 17.0 (*)    RBC 3.75 (*)    Hemoglobin 11.5 (*)    All other components within normal limits  BASIC METABOLIC PANEL - Abnormal; Notable for the following components:   Potassium 3.1 (*)    CO2 20 (*)    Glucose, Bld 163 (*)    BUN 22 (*)     Creatinine, Ser 2.10 (*)    Calcium 7.9 (*)    GFR, Estimated 27 (*)    Anion gap 16 (*)    All other components within normal limits  MAGNESIUM - Abnormal; Notable for the following components:   Magnesium 1.1 (*)    All other components  within normal limits  URINALYSIS, W/ REFLEX TO CULTURE (INFECTION SUSPECTED) - Abnormal; Notable for the following components:   Color, Urine RED (*)    APPearance TURBID (*)    Glucose, UA   (*)    Value: TEST NOT REPORTED DUE TO COLOR INTERFERENCE OF URINE PIGMENT   Hgb urine dipstick   (*)    Value: TEST NOT REPORTED DUE TO COLOR INTERFERENCE OF URINE PIGMENT   Bilirubin Urine   (*)    Value: TEST NOT REPORTED DUE TO COLOR INTERFERENCE OF URINE PIGMENT   Ketones, ur   (*)    Value: TEST NOT REPORTED DUE TO COLOR INTERFERENCE OF URINE PIGMENT   Protein, ur   (*)    Value: TEST NOT REPORTED DUE TO COLOR INTERFERENCE OF URINE PIGMENT   Nitrite   (*)    Value: TEST NOT REPORTED DUE TO COLOR INTERFERENCE OF URINE PIGMENT   Leukocytes,Ua   (*)    Value: TEST NOT REPORTED DUE TO COLOR INTERFERENCE OF URINE PIGMENT   Bacteria, UA RARE (*)    All other components within normal limits  CBC - Abnormal; Notable for the following components:   WBC 20.2 (*)    RBC 3.19 (*)    Hemoglobin 10.1 (*)    HCT 31.1 (*)    All other components within normal limits  GLUCOSE, CAPILLARY - Abnormal; Notable for the following components:   Glucose-Capillary 178 (*)    All other components within normal limits  GLUCOSE, CAPILLARY - Abnormal; Notable for the following components:   Glucose-Capillary 162 (*)    All other components within normal limits  GLUCOSE, CAPILLARY - Abnormal; Notable for the following components:   Glucose-Capillary 170 (*)    All other components within normal limits  GLUCOSE, CAPILLARY - Abnormal; Notable for the following components:   Glucose-Capillary 131 (*)    All other components within normal limits  BASIC METABOLIC PANEL -  Abnormal; Notable for the following components:   CO2 19 (*)    Glucose, Bld 147 (*)    BUN 25 (*)    Creatinine, Ser 1.46 (*)    Calcium 7.2 (*)    GFR, Estimated 41 (*)    All other components within normal limits  GLUCOSE, CAPILLARY - Abnormal; Notable for the following components:   Glucose-Capillary 176 (*)    All other components within normal limits  GLUCOSE, CAPILLARY - Abnormal; Notable for the following components:   Glucose-Capillary 158 (*)    All other components within normal limits  CBC - Abnormal; Notable for the following components:   WBC 25.5 (*)    RBC 3.16 (*)    Hemoglobin 9.8 (*)    HCT 31.0 (*)    All other components within normal limits  BASIC METABOLIC PANEL - Abnormal; Notable for the following components:   CO2 20 (*)    Glucose, Bld 113 (*)    Creatinine, Ser 1.11 (*)    Calcium 7.9 (*)    GFR, Estimated 57 (*)    All other components within normal limits  GLUCOSE, CAPILLARY - Abnormal; Notable for the following components:   Glucose-Capillary 146 (*)    All other components within normal limits  GLUCOSE, CAPILLARY - Abnormal; Notable for the following components:   Glucose-Capillary 130 (*)    All other components within normal limits  GLUCOSE, CAPILLARY - Abnormal; Notable for the following components:   Glucose-Capillary 111 (*)    All other components within normal  limits  GLUCOSE, CAPILLARY - Abnormal; Notable for the following components:   Glucose-Capillary 115 (*)    All other components within normal limits  GLUCOSE, CAPILLARY - Abnormal; Notable for the following components:   Glucose-Capillary 164 (*)    All other components within normal limits  GLUCOSE, CAPILLARY - Abnormal; Notable for the following components:   Glucose-Capillary 139 (*)    All other components within normal limits  CBC - Abnormal; Notable for the following components:   WBC 20.6 (*)    RBC 3.30 (*)    Hemoglobin 10.3 (*)    HCT 31.3 (*)    All other  components within normal limits  BASIC METABOLIC PANEL - Abnormal; Notable for the following components:   Glucose, Bld 112 (*)    Creatinine, Ser 1.01 (*)    Calcium 8.3 (*)    All other components within normal limits  GLUCOSE, CAPILLARY - Abnormal; Notable for the following components:   Glucose-Capillary 127 (*)    All other components within normal limits  GLUCOSE, CAPILLARY - Abnormal; Notable for the following components:   Glucose-Capillary 126 (*)    All other components within normal limits  GLUCOSE, CAPILLARY - Abnormal; Notable for the following components:   Glucose-Capillary 142 (*)    All other components within normal limits  GLUCOSE, CAPILLARY - Abnormal; Notable for the following components:   Glucose-Capillary 203 (*)    All other components within normal limits  GLUCOSE, CAPILLARY - Abnormal; Notable for the following components:   Glucose-Capillary 248 (*)    All other components within normal limits  GLUCOSE, CAPILLARY - Abnormal; Notable for the following components:   Glucose-Capillary 107 (*)    All other components within normal limits  CBC - Abnormal; Notable for the following components:   WBC 12.6 (*)    RBC 3.57 (*)    Hemoglobin 11.1 (*)    HCT 33.9 (*)    nRBC 0.6 (*)    All other components within normal limits  BASIC METABOLIC PANEL - Abnormal; Notable for the following components:   Potassium 3.2 (*)    Glucose, Bld 137 (*)    Creatinine, Ser 1.01 (*)    Calcium 8.3 (*)    All other components within normal limits  GLUCOSE, CAPILLARY - Abnormal; Notable for the following components:   Glucose-Capillary 155 (*)    All other components within normal limits  GLUCOSE, CAPILLARY - Abnormal; Notable for the following components:   Glucose-Capillary 146 (*)    All other components within normal limits  GLUCOSE, CAPILLARY - Abnormal; Notable for the following components:   Glucose-Capillary 206 (*)    All other components within normal limits   CBG MONITORING, ED - Abnormal; Notable for the following components:   Glucose-Capillary 141 (*)    All other components within normal limits  CBG MONITORING, ED - Abnormal; Notable for the following components:   Glucose-Capillary 177 (*)    All other components within normal limits  CBG MONITORING, ED - Abnormal; Notable for the following components:   Glucose-Capillary 130 (*)    All other components within normal limits  CBG MONITORING, ED - Abnormal; Notable for the following components:   Glucose-Capillary 187 (*)    All other components within normal limits  CBG MONITORING, ED - Abnormal; Notable for the following components:   Glucose-Capillary 162 (*)    All other components within normal limits  CULTURE, BLOOD (ROUTINE X 2)  CULTURE, BLOOD (ROUTINE X 2)  RESP PANEL BY RT-PCR (RSV, FLU A&B, COVID)  RVPGX2  URINE CULTURE  LIPASE, BLOOD  HIV ANTIBODY (ROUTINE TESTING W REFLEX)  VITAMIN B12  FOLATE  PROTIME-INR  MAGNESIUM  MAGNESIUM  GLUCOSE, CAPILLARY  TROPONIN I (HIGH SENSITIVITY)  TROPONIN I (HIGH SENSITIVITY)    EKG EKG Interpretation  Date/Time:  Thursday Jul 06 2022 18:37:52 EDT Ventricular Rate:  82 PR Interval:  140 QRS Duration: 128 QT Interval:  442 QTC Calculation: 516 R Axis:   80 Text Interpretation: Normal sinus rhythm Non-specific intra-ventricular conduction block Minimal voltage criteria for LVH, may be normal variant ( [employer] product ) Cannot rule out Anterior infarct , age undetermined T wave abnormality, consider lateral ischemia Abnormal ECG When compared with ECG of 05-Mar-2018 21:35, PREVIOUS ECG IS PRESENT lbbb noted, not seen on prior Confirmed by Tanda Rockers ([id number]) on 07/07/2022 5:07:01 AM  Radiology No results found.  Procedures Procedures    Medications Ordered in ED Medications  0.9 %  sodium chloride infusion (0 mLs Intravenous Stopped 07/07/22 1338)  sodium chloride 0.9 % bolus 2,000 mL (0 mLs Intravenous Stopped 07/06/22 2319)   ondansetron (ZOFRAN) injection 4 mg (4 mg Intravenous Given 07/06/22 2055)  iohexol (OMNIPAQUE) 300 MG/ML solution 100 mL (100 mLs Intravenous Contrast Given 07/06/22 2152)  metoCLOPramide (REGLAN) injection 10 mg (10 mg Intravenous Given 07/06/22 2118)  ciprofloxacin (CIPRO) IVPB 400 mg (0 mg Intravenous Stopped 07/07/22 0205)  prochlorperazine (COMPAZINE) injection 10 mg (10 mg Intravenous Given 07/06/22 2303)  potassium chloride 10 mEq in 100 mL IVPB (0 mEq Intravenous Stopped 07/07/22 0619)  vancomycin (VANCOCIN) IVPB 1000 mg/200 mL premix (0 mg Intravenous Stopped 07/07/22 0805)  potassium chloride 10 mEq in 100 mL IVPB (0 mEq Intravenous Stopped 07/08/22 0813)  lidocaine (XYLOCAINE) 1 % (with pres) injection 20 mL (5 mLs Intradermal Given 07/07/22 1039)  iohexol (OMNIPAQUE) 300 MG/ML solution 50 mL (10 mLs Per Tube Contrast Given 07/07/22 1042)  midazolam (VERSED) injection (1 mg Intravenous Given 07/07/22 1037)  fentaNYL (SUBLIMAZE) injection (50 mcg Intravenous Given 07/07/22 1037)  magnesium sulfate IVPB 4 g 100 mL (0 g Intravenous Stopped 07/08/22 0813)  potassium chloride SA (KLOR-CON M) CR tablet 40 mEq (40 mEq Oral Given 07/08/22 1203)  iohexol (OMNIPAQUE) 300 MG/ML solution 100 mL (100 mLs Intravenous Contrast Given 07/09/22 1233)  potassium chloride SA (KLOR-CON M) CR tablet 40 mEq (40 mEq Oral Given 07/11/22 1049)    ED Course/ Medical Decision Making/ A&P   {  CRITICAL CARE Performed by: Bethann Berkshire Total critical care time: 40 minutes Critical care time was exclusive of separately billable procedures and treating other patients. Critical care was necessary to treat or prevent imminent or life-threatening deterioration. Critical care was time spent personally by me on the following activities: development of treatment plan with patient and/or surrogate as well as nursing, discussions with consultants, evaluation of patient's response to treatment, examination of patient, obtaining history from patient  or surrogate, ordering and performing treatments and interventions, ordering and review of laboratory studies, ordering and review of radiographic studies, pulse oximetry and re-evaluation of patient's condition.                          Medical Decision Making Amount and/or Complexity of Data Reviewed Labs: ordered. Radiology: ordered.  Risk Prescription drug management. Decision regarding hospitalization.   This patient presents to the ED for concern of vomiting and flank pain, this involves an extensive number of treatment options,  and is a complaint that carries with it a high risk of complications and morbidity.  The differential diagnosis includes any stone, pyelonephritis   Co morbidities that complicate the patient evaluation  Diabetes and hypertension   Additional history obtained:  Additional history obtained from patient External records from outside source obtained and reviewed including hospital records   Lab Tests:  I Ordered, and personally interpreted labs.  The pertinent results include: White blood count 18,000, creatinine 1.1   Imaging Studies ordered:  I ordered imaging studies including CT renal I independently visualized and interpreted imaging which showed obstructing 10 x 16 x 19 right UPJ stone I agree with the radiologist interpretation   Cardiac Monitoring: / EKG:  The patient was maintained on a cardiac monitor.  I personally viewed and interpreted the cardiac monitored which showed an underlying rhythm of: Normal sinus rhythm   Consultations Obtained:  I requested consultation with the hospitalist and urology Dr. Margo Aye,  and discussed lab and imaging findings as well as pertinent plan - they recommend: Admit to hospitalist at [facility] with urology consult   Problem List / ED Course / Critical interventions / Medication management  Diabetes, hypertension, obstructing kidney stone I ordered medication including antibiotics for  urinary tract infection Reevaluation of the patient after these medicines showed that the patient stayed the same I have reviewed the patients home medicines and have made adjustments as needed   Social Determinants of Health:  None   Test / Admission - Considered:  None   Patient with an obstructing right kidney stone she will be admitted to medicine at [facility] with urology consult  Final Clinical Impression(s) / ED Diagnoses Final diagnoses:  Obstructive nephropathy  Abdominal pain, unspecified abdominal location  Sepsis, due to unspecified organism, unspecified whether acute organ dysfunction present [facility])    Rx / DC Orders ED Discharge Orders          Ordered    potassium chloride SA (KLOR-CON M) 20 MEQ tablet  Daily        07/11/22 1020    carvedilol (COREG) 6.25 MG tablet  2 times daily with meals        07/11/22 1020    amLODipine (NORVASC) 10 MG tablet  Daily        07/11/22 1020    cephALEXin (KEFLEX) 500 MG capsule  3 times daily        07/11/22 1020    Increase activity slowly        07/11/22 1020    Discharge instructions       Comments: 1)Please take prescribed  medications as instructed 2)Follow up with your PCP in a week.Do an EKG during the follow-up to check QTc interval.  Also do CBC and BMP tests during the follow up 3)Follow up with urology and interventional disease in the outpatient.  Name and number of the providers have attached 4)Monitor your blood pressure at home   07/11/22 1020    No wound care        07/11/22 1020    Diet Carb Modified        07/11/22 1020    pantoprazole (PROTONIX) 40 MG tablet  Daily        07/11/22 1025    diphenhydrAMINE (BENADRYL) 25 mg capsule  Every 6 hours PRN        07/11/22 1457    sodium chloride flush 0.9 % SOLN injection       Note to  Pharmacy: Please dispense box of 10 mL saline flushes for at-home drain care   07/08/22 1230    IR Radiologist Eval & Mgmt        07/08/22 1230               Bethann Berkshire, MD 07/15/22 1139

## 2022-07-20 NOTE — Patient Instructions (Addendum)
SURGICAL WAITING ROOM VISITATION  Patients having surgery or a procedure may have no more than 2 support people in the waiting area - these visitors may rotate.    Children under the age of 21 must have an adult with them who is not the patient.  Due to an increase in RSV and influenza rates and associated hospitalizations, children ages 89 and under may not visit patients in [facility] hospitals.  If the patient needs to stay at the hospital during part of their recovery, the visitor guidelines for inpatient rooms apply. Pre-op nurse will coordinate an appropriate time for 1 support person to accompany patient in pre-op.  This support person may not rotate.    Please refer to the [facility] website for the visitor guidelines for Inpatients (after your surgery is over and you are in a regular room).     Your procedure is scheduled on: 07/27/22  Report to [facility] Main Entrance   Report to admitting at 5:15 AM  Call this number if you have problems the morning of surgery [phone]  Do not eat food or drink liquids :After Midnight. DAY OF SURGERY         If you have questions, please contact your surgeon's office.   FOLLOW BOWEL PREP AND ANY ADDITIONAL PRE OP INSTRUCTIONS YOU RECEIVED FROM YOUR SURGEON'S OFFICE!!!     Oral Hygiene is also important to reduce your risk of infection.                                    Remember - BRUSH YOUR TEETH THE MORNING OF SURGERY WITH YOUR REGULAR TOOTHPASTE  DENTURES WILL BE REMOVED PRIOR TO SURGERY PLEASE DO NOT APPLY "Poly grip" OR ADHESIVES!!!  Do NOT smoke after Midnight  Take these medicines the morning of surgery with A SIP OF WATER:                                                 Amlodipine Cephalexin Benadryl Pantoprazole Rosuvastatin  DO NOT TAKE ANY ORAL DIABETIC MEDICATIONS DAY OF YOUR SURGERY  How to Manage Your Diabetes Before and After Surgery  Why is it important to control my blood sugar before and  after surgery? Improving blood sugar levels before and after surgery helps healing and can limit problems. A way of improving blood sugar control is eating a healthy diet by:  Eating less sugar and carbohydrates  Increasing activity/exercise  Talking with your doctor about reaching your blood sugar goals High blood sugars (greater than 180 mg/dL) can raise your risk of infections and slow your recovery, so you will need to focus on controlling your diabetes during the weeks before surgery. Make sure that the doctor who takes care of your diabetes knows about your planned surgery including the date and location.  How do I manage my blood sugar before surgery? Check your blood sugar at least 4 times a day, starting 2 days before surgery, to make sure that the level is not too high or low. Check your blood sugar the morning of your surgery when you wake up and every 2 hours until you get to the Short Stay unit. If your blood sugar is less than 70 mg/dL, you will need to treat for low blood sugar:  Do not take insulin. Treat a low blood sugar (less than 70 mg/dL) with  cup of clear juice (cranberry or apple), 4 glucose tablets, OR glucose gel. Recheck blood sugar in 15 minutes after treatment (to make sure it is greater than 70 mg/dL). If your blood sugar is not greater than 70 mg/dL on recheck, call [phone] for further instructions. Report your blood sugar to the short stay nurse when you get to Short Stay.  If you are admitted to the hospital after surgery: Your blood sugar will be checked by the staff and you will probably be given insulin after surgery (instead of oral diabetes medicines) to make sure you have good blood sugar levels. The goal for blood sugar control after surgery is 80-180 mg/dL.   WHAT DO I DO ABOUT MY DIABETES MEDICATION?  Do not take oral diabetes medicines (pills) the morning of surgery.  THE MORNING BEFORE SURGERY, take   usual dose of NPH insulin in the  morning, Take half(50%) of evening dose of NPH     THE MORNING OF SURGERY, take 50% of NPH insulin dose  If your CBG is greater than 220 mg/dL, you may take  of your sliding scale  (correction) dose of insulin.     Reviewed and Endorsed by [facility] Patient Education Committee, August 2015  Bring CPAP mask and tubing day of surgery.                              You may not have any metal on your body including hair pins, jewelry, and body piercing             Do not wear make-up, lotions, powders, perfumes/cologne, or deodorant  Do not wear nail polish including gel and S&S, artificial/acrylic nails, or any other type of covering on natural nails including finger and toenails. If you have artificial nails, gel coating, etc. that needs to be removed by a nail salon please have this removed prior to surgery or surgery may need to be canceled/ delayed if the surgeon/ anesthesia feels like they are unable to be safely monitored.   Do not shave  48 hours prior to surgery.   Do not bring valuables to the hospital. [facility] IS NOT RESPONSIBLE   FOR VALUABLES.  Contacts, glasses, dentures or bridgework may not be worn into surgery.  Bring small overnight bag day of surgery.   DO NOT BRING YOUR HOME MEDICATIONS TO THE HOSPITAL. PHARMACY WILL DISPENSE MEDICATIONS LISTED ON YOUR MEDICATION LIST TO YOU DURING YOUR ADMISSION IN THE HOSPITAL!   Patients discharged on the day of surgery will not be allowed to drive home.  Someone NEEDS to stay with you for the first 24 hours after anesthesia.  Special Instructions: Bring a copy of your healthcare power of attorney and living will documents the day of surgery if you haven't scanned them before.              Please read over the following fact sheets you were given: IF YOU HAVE QUESTIONS ABOUT YOUR PRE-OP INSTRUCTIONS PLEASE CALL [phone] [place]   If you received a COVID test during your pre-op visit  it is requested that you wear a mask  when out in public, stay away from anyone that may not be feeling well and notify your surgeon if you develop symptoms. If you test positive for Covid or have been in contact with anyone that has tested  positive in the last 10 days please notify you surgeon.  [facility] - Preparing for Surgery Before surgery, you can play an important role.  Because skin is not sterile, your skin needs to be as free of germs as possible.  You can reduce the number of germs on your skin by washing with CHG (chlorahexidine gluconate) soap before surgery.  CHG is an antiseptic cleaner which kills germs and bonds with the skin to continue killing germs even after washing. Please DO NOT use if you have an allergy to CHG or antibacterial soaps.  If your skin becomes reddened/irritated stop using the CHG and inform your nurse when you arrive at Short Stay. Do not shave (including legs and underarms) for at least 48 hours prior to the first CHG shower.  You may shave your face/neck.  Please follow these instructions carefully: 1.  Shower with CHG Soap the night before surgery and the  morning of surgery. 2.  If you choose to wash your hair, wash your hair first as usual with your normal  shampoo. 3.  After you shampoo, rinse your hair and body thoroughly to remove the shampoo.                            4.  Use CHG as you would any other liquid soap.  You can apply chg directly to the skin and wash.  Gently with a scrungie or clean washcloth. 5.  Apply the CHG Soap to your body ONLY FROM THE NECK DOWN.   Do   not use on face/ open                           Wound or open sores. Avoid contact with eyes, ears mouth and   genitals (private parts).                       Wash face,  Genitals (private parts) with your normal soap.            6.  Wash thoroughly, paying special attention to the area where your    surgery  will be performed. 7.  Thoroughly rinse your body with warm water from the neck down. 8.  DO NOT shower/wash  with your normal soap after using and rinsing off the CHG Soap.   9.  Pat yourself dry with a clean towel.            10.  Wear clean pajamas.            11.  Place clean sheets on your bed the night of your first shower and do not  sleep with pets. Day of Surgery : Do not apply any lotions/deodorants the morning of surgery.  Please wear clean clothes to the hospital/surgery center.  FAILURE TO FOLLOW THESE INSTRUCTIONS MAY RESULT IN THE CANCELLATION OF YOUR SURGERY  PATIENT SIGNATURE_________________________________  NURSE SIGNATURE__________________________________  ________________________________________________________________________

## 2022-07-20 NOTE — Patient Instructions (Signed)
How to Manage Your Diabetes Before and After Surgery  Why is it important to control my blood sugar before and after surgery? Improving blood sugar levels before and after surgery helps healing and can limit problems. A way of improving blood sugar control is eating a healthy diet by:  Eating less sugar and carbohydrates  Increasing activity/exercise  Talking with your doctor about reaching your blood sugar goals High blood sugars (greater than 180 mg/dL) can raise your risk of infections and slow your recovery, so you will need to focus on controlling your diabetes during the weeks before surgery. Make sure that the doctor who takes care of your diabetes knows about your planned surgery including the date and location.  How do I manage my blood sugar before surgery? Check your blood sugar at least 4 times a day, starting 2 days before surgery, to make sure that the level is not too high or low. Check your blood sugar the morning of your surgery when you wake up and every 2 hours until you get to the Short Stay unit. If your blood sugar is less than 70 mg/dL, you will need to treat for low blood sugar: Do not take insulin. Treat a low blood sugar (less than 70 mg/dL) with  cup of clear juice (cranberry or apple), 4 glucose tablets, OR glucose gel. Recheck blood sugar in 15 minutes after treatment (to make sure it is greater than 70 mg/dL). If your blood sugar is not greater than 70 mg/dL on recheck, call [phone] for further instructions. Report your blood sugar to the short stay nurse when you get to Short Stay.  If you are admitted to the hospital after surgery: Your blood sugar will be checked by the staff and you will probably be given insulin after surgery (instead of oral diabetes medicines) to make sure you have good blood sugar levels. The goal for blood sugar control after surgery is 80-180 mg/dL.   WHAT DO I DO ABOUT MY DIABETES MEDICATION?  Do not take oral diabetes  medicines (pills) the morning of surgery.  THE DAY BEFORE SURGERY, take usual dose of NPH in the morning, take only 50% of evening dose       THE MORNING OF SURGERY, take only 50% of NPH insulin  If your CBG is greater than 220 mg/dL, you may take  of your sliding scale  (correction) dose of insulin.    Reviewed and Endorsed by [facility] Patient Education Committee, August 2015[facility] - Preparing for Surgery Before surgery, you can play an important role.  Because skin is not sterile, your skin needs to be as free of germs as possible.  You can reduce the number of germs on your skin by washing with CHG (chlorahexidine gluconate) soap before surgery.  CHG is an antiseptic cleaner which kills germs and bonds with the skin to continue killing germs even after washing. Please DO NOT use if you have an allergy to CHG or antibacterial soaps.  If your skin becomes reddened/irritated stop using the CHG and inform your nurse when you arrive at Short Stay. Do not shave (including legs and underarms) for at least 48 hours prior to the first CHG shower.  You may shave your face/neck.  Please follow these instructions carefully:  1.  Shower with CHG Soap the night before surgery and the  morning of surgery.  2.  If you choose to wash your hair, wash your hair first as usual with your normal  shampoo.  3.  After you shampoo, rinse your hair and body thoroughly to remove the shampoo.                             4.  Use CHG as you would any other liquid soap.  You can apply chg directly to the skin and wash.  Gently with a scrungie or clean washcloth.  5.  Apply the CHG Soap to your body ONLY FROM THE NECK DOWN.   Do   not use on face/ open                           Wound or open sores. Avoid contact with eyes, ears mouth and   genitals (private parts).                       Wash face,  Genitals (private parts) with your normal soap.             6.  Wash thoroughly, paying special attention to the  area where your    surgery  will be performed.  7.  Thoroughly rinse your body with warm water from the neck down.  8.  DO NOT shower/wash with your normal soap after using and rinsing off the CHG Soap.                9.  Pat yourself dry with a clean towel.            10.  Wear clean pajamas.            11.  Place clean sheets on your bed the night of your first shower and do not  sleep with pets. Day of Surgery : Do not apply any lotions/deodorants the morning of surgery.  Please wear clean clothes to the hospital/surgery center.  FAILURE TO FOLLOW THESE INSTRUCTIONS MAY RESULT IN THE CANCELLATION OF YOUR SURGERY  PATIENT SIGNATURE_________________________________  NURSE SIGNATURE__________________________________  ________________________________________________________________________SURGICAL WAITING ROOM VISITATION  Patients having surgery or a procedure may have no more than 2 support people in the waiting area - these visitors may rotate.    Children under the age of 66 must have an adult with them who is not the patient.  Due to an increase in RSV and influenza rates and associated hospitalizations, children ages 73 and under may not visit patients in [facility] hospitals.  If the patient needs to stay at the hospital during part of their recovery, the visitor guidelines for inpatient rooms apply. Pre-op nurse will coordinate an appropriate time for 1 support person to accompany patient in pre-op.  This support person may not rotate.    Please refer to the [facility] website for the visitor guidelines for Inpatients (after your surgery is over and you are in a regular room).       Your procedure is scheduled on: 07/27/22   Report to [facility] Main Entrance    Report to admitting at 5:15 AM   Call this number if you have problems the morning of surgery [phone]   Do not eat food :After Midnight.             If you have questions, please contact your  surgeon's office.   FOLLOW BOWEL PREP AND ANY ADDITIONAL PRE OP INSTRUCTIONS YOU RECEIVED FROM YOUR SURGEON'S OFFICE!!!     Oral Hygiene is also important to reduce  your risk of infection.                                    Remember - BRUSH YOUR TEETH THE MORNING OF SURGERY WITH YOUR REGULAR TOOTHPASTE  DENTURES WILL BE REMOVED PRIOR TO SURGERY PLEASE DO NOT APPLY "Poly grip" OR ADHESIVES!!!   Do NOT smoke after Midnight   Take these medicines the morning of surgery with A SIP OF WATER: Amlodipine, Cephalexin, Benadryl, Pantoprazole, Rosuvastatin   Bring CPAP mask and tubing day of surgery.                              You may not have any metal on your body including hair pins, jewelry, and body piercing             Do not wear make-up, lotions, powders, perfumes, or deodorant  Do not wear nail polish including gel and S&S, artificial/acrylic nails, or any other type of covering on natural nails including finger and toenails. If you have artificial nails, gel coating, etc. that needs to be removed by a nail salon please have this removed prior to surgery or surgery may need to be canceled/ delayed if the surgeon/ anesthesia feels like they are unable to be safely monitored.   Do not shave  48 hours prior to surgery.             Do not bring valuables to the hospital. [facility] IS NOT             RESPONSIBLE   FOR VALUABLES.   Contacts, glasses, dentures or bridgework may not be worn into surgery.  DO NOT BRING YOUR HOME MEDICATIONS TO THE HOSPITAL. PHARMACY WILL DISPENSE MEDICATIONS LISTED ON YOUR MEDICATION LIST TO YOU DURING YOUR ADMISSION IN THE HOSPITAL!    Patients discharged on the day of surgery will not be allowed to drive home.  Someone NEEDS to stay with you for the first 24 hours after anesthesia.   Special Instructions: Bring a copy of your healthcare power of attorney and living will documents the day of surgery if you haven't scanned them before.               Please read over the following fact sheets you were given: IF YOU HAVE QUESTIONS ABOUT YOUR PRE-OP INSTRUCTIONS PLEASE CALL [phone]   If you received a COVID test during your pre-op visit  it is requested that you wear a mask when out in public, stay away from anyone that may not be feeling well and notify your surgeon if you develop symptoms. If you test positive for Covid or have been in contact with anyone that has tested positive in the last 10 days please notify you surgeon.

## 2022-07-20 NOTE — Progress Notes (Addendum)
COVID Vaccine received:  []  No [x]  Yes Date of any COVID positive Test in last 55 days:No  PCP - Syliva Overman MD Cardiologist - Dr. Thurman Coyer 07/21/22 Seen for LBBB Chest x-ray - 07/06/22 EPIC EKG -  07/21/22 EPIC Stress Test - No ECHO - Scheduled for 08/18/22 Cardiac Cath - No  Bowel Prep - [x]  No  []   Yes ______  Pacemaker / ICD device [x]  No []  Yes   Spinal Cord Stimulator:[x]  No []  Yes       History of Sleep Apnea? [x]  No []  Yes   CPAP used?- [x]  No []  Yes    Does the patient monitor blood sugar?          []  No [x]  Yes  []  N/A  Patient has: []  NO Hx DM   []  Pre-DM                 []  DM1  [x]   DM2 Does patient have a [employer] or Dexacom? [x]  No []  Yes   Fasting Blood Sugar Ranges- 132-148 Checks Blood Sugar 2  times a day  GLP1 agonist / usual dose - No GLP1 instructions:  SGLT-2 inhibitors / usual dose - No SGLT-2 instructions:   Blood Thinner / Instructions:No Aspirin Instructions:No  Comments:   Activity level: Patient is able to climb a flight of stairs without difficulty; [x]  No CP  [x]  No SOB   Patient can  perform ADLs without assistance.   Anesthesia review: LBBB, HTN, DM2  Patient denies shortness of breath, fever, cough and chest pain at PAT appointment.  Patient verbalized understanding and agreement to the Pre-Surgical Instructions that were given to them at this PAT appointment. Patient was also educated of the need to review these PAT instructions again prior to his/her surgery.I reviewed the appropriate phone numbers to call if they have any and questions or concerns.

## 2022-07-21 ENCOUNTER — Encounter: Payer: Self-pay | Admitting: Family Medicine

## 2022-07-21 ENCOUNTER — Ambulatory Visit (INDEPENDENT_AMBULATORY_CARE_PROVIDER_SITE_OTHER): Payer: BC Managed Care – PPO | Admitting: Family Medicine

## 2022-07-21 ENCOUNTER — Ambulatory Visit: Payer: BC Managed Care – PPO | Attending: Internal Medicine | Admitting: Internal Medicine

## 2022-07-21 ENCOUNTER — Encounter: Payer: Self-pay | Admitting: Internal Medicine

## 2022-07-21 VITALS — BP 140/80 | HR 90 | Ht 64.0 in | Wt 189.5 lb

## 2022-07-21 VITALS — BP 140/80 | HR 54 | Ht 64.0 in | Wt 191.1 lb

## 2022-07-21 DIAGNOSIS — R6520 Severe sepsis without septic shock: Secondary | ICD-10-CM

## 2022-07-21 DIAGNOSIS — E785 Hyperlipidemia, unspecified: Secondary | ICD-10-CM

## 2022-07-21 DIAGNOSIS — E1159 Type 2 diabetes mellitus with other circulatory complications: Secondary | ICD-10-CM | POA: Diagnosis not present

## 2022-07-21 DIAGNOSIS — I447 Left bundle-branch block, unspecified: Secondary | ICD-10-CM

## 2022-07-21 DIAGNOSIS — R9431 Abnormal electrocardiogram [ECG] [EKG]: Secondary | ICD-10-CM

## 2022-07-21 DIAGNOSIS — I2510 Atherosclerotic heart disease of native coronary artery without angina pectoris: Secondary | ICD-10-CM | POA: Diagnosis not present

## 2022-07-21 DIAGNOSIS — I10 Essential (primary) hypertension: Secondary | ICD-10-CM | POA: Diagnosis not present

## 2022-07-21 DIAGNOSIS — Z7689 Persons encountering health services in other specified circumstances: Secondary | ICD-10-CM

## 2022-07-21 DIAGNOSIS — N138 Other obstructive and reflux uropathy: Secondary | ICD-10-CM

## 2022-07-21 DIAGNOSIS — A419 Sepsis, unspecified organism: Secondary | ICD-10-CM

## 2022-07-21 MED ORDER — AMLODIPINE BESYLATE 10 MG PO TABS: 10.0000 mg | ORAL_TABLET | Freq: Every day | ORAL | 6 refills | Status: DC

## 2022-07-21 MED ORDER — CARVEDILOL 6.25 MG PO TABS: 6.2500 mg | ORAL_TABLET | Freq: Two times a day (BID) | ORAL | 1 refills | Status: DC

## 2022-07-21 NOTE — Progress Notes (Signed)
Cardiology Office Note:    Date:  07/21/2022   ID:  Caitlin Thompson, DOB Mar 13, 1960, MRN [id number]  PCP:  Kerri Perches, MD   [facility] Providers Cardiologist:  Maisie Fus, MD     Referring MD: Kerri Perches, MD   No chief complaint on file. LBBB  History of Present Illness:    Caitlin Thompson is a 62 y.o. female with a hx of DM2, HTN, recent 10 x 16 x 19 mm calculus within the right ureteropelvic junction resulting in moderate right hydronephrosis, superimposed mild bilateral nonobstructing nephrolithiasis. S/P IR guided nephrostomy tube placement.   Patient had a LBBB while in the hospital 07/10/2022. Qtc is prolonged with LBBB.  Last EKG in the system was 2020, unknown if this is recent. Cardiology was not involved. Troponin was negative.  She walks often, 5-7 miles a day. She did not have prior CP.  No SOB.  Parents had kidney disease.  Past Medical History:  Diagnosis Date   Adenomatous colon polyp 06/26/2011   05/17/11 Colonoscopy Dr Allayne Butcher adenoma Next colonoscopy 05/2016    DERMATOMYCOSIS 05/23/2010   Qualifier: Diagnosis of  By: Lodema Hong MD, Margaret     Diabetes mellitus type II 2007   GERD (gastroesophageal reflux disease) 04/03/2011   Non-critical Schatzki's ring, HH on EGD 05/17/11 Dr Jena Gauss    Hematuria    HYDRONEPHROSIS, LEFT 08/30/2009   Qualifier: Diagnosis of  By: Garnette Czech PA, Dawn     HYPERLIPIDEMIA 12/01/2009   Qualifier: Diagnosis of  By: Garnette Czech PA, Dawn     Hypertension    Obesity (BMI 30.0-34.9) 07/15/2011   Renal lithiasis 6/11   Hospitalized a [facility] 3 days in June    Schatzki's ring    THYROID STIMULATING HORMONE, ABNORMAL 08/30/2009   Qualifier: Diagnosis of  By: Garnette Czech PA, Dawn     Tubular adenoma of colon 3/13    Past Surgical History:  Procedure Laterality Date   COLONOSCOPY N/A 01/29/2019   Procedure: COLONOSCOPY;  Surgeon: Corbin Ade, MD;  Location: [facility];  Service: Endoscopy;   Laterality: N/A;  2:00   COLONOSCOPY W/ POLYPECTOMY  05/17/11   Rourk-Tubular adenoma colon, benign SB bx   ESOPHAGEAL DILATION  05/17/2011   Rourk-Hiatal hernia/Incomplete noncritical Schatzki's ring/HH   IR NEPHROSTOMY PLACEMENT RIGHT  07/07/2022   POLYPECTOMY  01/29/2019   Procedure: POLYPECTOMY;  Surgeon: Corbin Ade, MD;  Location: [facility];  Service: Endoscopy;;   WISDOM TOOTH EXTRACTION      Current Medications: Current Outpatient Medications on File Prior to Visit  Medication Sig Dispense Refill   amLODipine (NORVASC) 10 MG tablet Take 1 tablet (10 mg total) by mouth daily. 30 tablet 6   carvedilol (COREG) 6.25 MG tablet Take 1 tablet (6.25 mg total) by mouth 2 (two) times daily with a meal. 60 tablet 1   cephALEXin (KEFLEX) 500 MG capsule Take 1 capsule (500 mg total) by mouth 3 (three) times daily for 10 days. 30 capsule 0   diphenhydrAMINE (BENADRYL) 25 mg capsule Take 1 capsule (25 mg total) by mouth every 6 (six) hours as needed for itching or allergies. 30 capsule 0   glucose blood (ONETOUCH VERIO) test strip USE 1 STRIP TO CHECK GLUCOSE THREE TIMES DAILY 150 each 0   insulin NPH-regular Human (70-30) 100 UNIT/ML injection Take 40  units in the morning and take 10 units in the evening (Patient taking differently: Inject 0-40 Units into the skin daily.)  10 mL 11   OneTouch Delica Lancets 33G MISC Three times daily testing dx e11.65 150 each 1   pantoprazole (PROTONIX) 40 MG tablet Take 1 tablet (40 mg total) by mouth daily. 30 tablet 0   rosuvastatin (CRESTOR) 10 MG tablet Take 1 tablet (10 mg total) by mouth daily. 90 tablet 3   sodium chloride flush 0.9 % SOLN injection Flush nephrostomy drain once daily with 5 mL of saline 300 mL 2   potassium chloride SA (KLOR-CON M) 20 MEQ tablet Take 2 tablets (40 mEq total) by mouth daily for 3 doses. (Patient not taking: Reported on 07/21/2022) 6 tablet 0   No current facility-administered medications on file prior to visit.     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[place] [employer]     Comment: [employer]    Employer: [employer]  Tobacco Use   Smoking status: Never   Smokeless tobacco: Never  Substance and Sexual Activity   Alcohol use: No   Drug use: No   Sexual activity: Yes    Birth control/protection: None  Other Topics Concern   Not on file  Social History Narrative   1 GROWN DAUGHTER   Social Determinants of Health   Financial Resource Strain: Not on file  Food Insecurity: No Food Insecurity (07/07/2022)   Hunger Vital Sign    Worried About Running Out of Food in the Last Year: Never true    Ran Out of Food in the Last Year: Never true  Transportation Needs: No Transportation Needs (07/12/2022)   PRAPARE - Administrator, Civil Service (Medical): No    Lack of Transportation (Non-Medical): No  Physical Activity: Not on file  Stress: Not on file  Social Connections: Not on file     Family History: The patient's family history includes Aneurysm in her sister; Crohn's disease in her sister; Diabetes in her brother, father, and mother; Hypertension in her father and mother; Kidney failure in her father and mother.  ROS:   Please see the history of present illness.     All other systems reviewed and are negative.  EKGs/Labs/Other Studies Reviewed:    The following studies were reviewed today:    EKG:  EKG is  ordered today.  The ekg ordered today demonstrates   07/21/2022- NSR , LBBB morohology  Recent Labs: 05/19/2022: ALT 15 07/10/2022: Magnesium 1.9 07/11/2022: BUN 14; Creatinine, Ser 1.01; Hemoglobin 11.1; Platelets 227; Potassium 3.2;  Sodium 135   Recent Lipid Panel    Component Value Date/Time   CHOL 240 (H) 05/19/2022 1612   TRIG 118 05/19/2022 1612   HDL 61 05/19/2022 1612   CHOLHDL 3.9 05/19/2022 1612   CHOLHDL 3.5 02/03/2019 1235   VLDL 28 02/03/2019 1235   LDLCALC 158 (H) 05/19/2022 1612   LDLCALC 161 (H) 09/27/2018 1311     Risk Assessment/Calculations:     Physical Exam:    VS: Vitals:   07/21/22 1413  BP: (!) 140/80  Pulse: 90  SpO2: 98%     Wt Readings from Last 3 Encounters:  07/21/22 189 lb 8 oz (86 kg)  07/21/22 191 lb 1.9  oz (86.7 kg)  07/06/22 182 lb (82.6 kg)     GEN:  Well nourished, well developed in no acute distress HEENT: Normal NECK: No JVD; No carotid bruits LYMPHATICS: No lymphadenopathy CARDIAC: RRR, SEM, rubs, gallops RESPIRATORY:  Clear to auscultation without rales, wheezing or rhonchi  ABDOMEN: Soft, non-tender, non-distended MUSCULOSKELETAL:  No edema; No deformity  SKIN: Warm and dry NEUROLOGIC:  Alert and oriented x 3 PSYCHIATRIC:  Normal affect   ASSESSMENT:   LBBB: patient being seen for LBBB noted while in the hospital earlier this month. She has no chest pain, trops were normal. Unknown if this is new. Dont suspect an MI. Will get a TTE to be comprehensive  SEM: suspect AS  QTc: JTC < 500 ms.  PLAN:    In order of problems listed above:  TTE Follow up PRN   Medication Adjustments/Labs and Tests Ordered: Current medicines are reviewed at length with the patient today.  Concerns regarding medicines are outlined above.  Orders Placed This Encounter  Procedures   ECHOCARDIOGRAM COMPLETE   No orders of the defined types were placed in this encounter.   Patient Instructions  Medication Instructions:  No Changes In Medications at this time.   *If you need a refill on your cardiac medications before your next appointment, please call your pharmacy*  Lab Work: None Ordered At This Time.   If you have labs (blood work) drawn today and your  tests are completely normal, you will receive your results only by: MyChart Message (if you have MyChart) OR A paper copy in the mail If you have any lab test that is abnormal or we need to change your treatment, we will call you to review the results.  Testing/Procedures: Your physician has requested that you have an echocardiogram. Echocardiography is a painless test that uses sound waves to create images of your heart. It provides your doctor with information about the size and shape of your heart and how well your heart's chambers and valves are working. You may receive an ultrasound enhancing agent through an IV if needed to better visualize your heart during the echo.This procedure takes approximately one hour. There are no restrictions for this procedure. This will take place at the [address], [address].    Follow-Up: At [facility], you and your health needs are our priority.  As part of our continuing mission to provide you with exceptional heart care, we have created designated Provider Care Teams.  These Care Teams include your primary Cardiologist (physician) and Advanced Practice Providers (APPs -  Physician Assistants and Nurse Practitioners) who all work together to provide you with the care you need, when you need it.  Your next appointment:   AS NEEDED   Provider:   Maisie Fus, MD      Signed, Maisie Fus, MD  07/21/2022 2:25 PM    [facility]

## 2022-07-21 NOTE — Patient Instructions (Addendum)
F/U in 6 to 7 weeks, re evaluate blood pressure, call if you need me sooner  I DO recommend some time from work to recover from your recent hospitalization and also with your upcoming procedure  Nurse please change dressing   Labs today and you are referred  to Cardiology for evaluation of your heart  You need to rest and give your body time to recover   Please take your 2 blood pressure pills regularly and on a schedule, they are amlodipine 10 mg and carvedilol

## 2022-07-21 NOTE — Patient Instructions (Signed)
Medication Instructions:  No Changes In Medications at this time.  *If you need a refill on your cardiac medications before your next appointment, please call your pharmacy*  Lab Work: None Ordered At This Time.  If you have labs (blood work) drawn today and your tests are completely normal, you will receive your results only by: MyChart Message (if you have MyChart) OR A paper copy in the mail If you have any lab test that is abnormal or we need to change your treatment, we will call you to review the results.  Testing/Procedures: Your physician has requested that you have an echocardiogram. Echocardiography is a painless test that uses sound waves to create images of your heart. It provides your doctor with information about the size and shape of your heart and how well your heart's chambers and valves are working. You may receive an ultrasound enhancing agent through an IV if needed to better visualize your heart during the echo.This procedure takes approximately one hour. There are no restrictions for this procedure. This will take place at the [address], [address].   Follow-Up: At [facility], you and your health needs are our priority.  As part of our continuing mission to provide you with exceptional heart care, we have created designated Provider Care Teams.  These Care Teams include your primary Cardiologist (physician) and Advanced Practice Providers (APPs -  Physician Assistants and Nurse Practitioners) who all work together to provide you with the care you need, when you need it.  Your next appointment:   AS NEEDED   Provider:   Branch, Mary E, MD    

## 2022-07-22 LAB — BMP8+EGFR
BUN/Creatinine Ratio: 16 (ref 9–23)
CO2: 21 mmol/L (ref 20–29)
Calcium: 9.7 mg/dL (ref 8.7–10.2)
Chloride: 100 mmol/L (ref 96–106)

## 2022-07-22 LAB — CBC
Hematocrit: 37 % (ref 34.0–46.6)
Platelets: 486 10*3/uL — ABNORMAL HIGH (ref 150–450)

## 2022-07-24 ENCOUNTER — Encounter (HOSPITAL_COMMUNITY)
Admission: RE | Admit: 2022-07-24 | Discharge: 2022-07-24 | Disposition: A | Discharge status: Home / Self Care | Payer: BC Managed Care – PPO | Source: Ambulatory Visit | Attending: Urology | Admitting: Urology

## 2022-07-24 ENCOUNTER — Encounter: Payer: Self-pay | Admitting: Family Medicine

## 2022-07-24 ENCOUNTER — Encounter (HOSPITAL_COMMUNITY): Payer: Self-pay

## 2022-07-24 ENCOUNTER — Other Ambulatory Visit: Payer: Self-pay

## 2022-07-24 VITALS — BP 156/88 | Temp 98.3°F | Resp 16 | Ht 64.0 in | Wt 185.0 lb

## 2022-07-24 DIAGNOSIS — N132 Hydronephrosis with renal and ureteral calculous obstruction: Secondary | ICD-10-CM | POA: Diagnosis not present

## 2022-07-24 DIAGNOSIS — I10 Essential (primary) hypertension: Secondary | ICD-10-CM

## 2022-07-24 DIAGNOSIS — E119 Type 2 diabetes mellitus without complications: Secondary | ICD-10-CM | POA: Diagnosis not present

## 2022-07-24 DIAGNOSIS — I447 Left bundle-branch block, unspecified: Secondary | ICD-10-CM | POA: Insufficient documentation

## 2022-07-24 DIAGNOSIS — I700 Atherosclerosis of aorta: Secondary | ICD-10-CM | POA: Diagnosis not present

## 2022-07-24 DIAGNOSIS — R609 Edema, unspecified: Secondary | ICD-10-CM | POA: Insufficient documentation

## 2022-07-24 DIAGNOSIS — K5730 Diverticulosis of large intestine without perforation or abscess without bleeding: Secondary | ICD-10-CM | POA: Diagnosis not present

## 2022-07-24 DIAGNOSIS — Z7689 Persons encountering health services in other specified circumstances: Secondary | ICD-10-CM | POA: Insufficient documentation

## 2022-07-24 DIAGNOSIS — Z01812 Encounter for preprocedural laboratory examination: Secondary | ICD-10-CM | POA: Diagnosis not present

## 2022-07-24 DIAGNOSIS — J90 Pleural effusion, not elsewhere classified: Secondary | ICD-10-CM | POA: Diagnosis not present

## 2022-07-24 DIAGNOSIS — Z794 Long term (current) use of insulin: Secondary | ICD-10-CM | POA: Insufficient documentation

## 2022-07-24 HISTORY — DX: Personal history of urinary calculi: Z87.442

## 2022-07-24 HISTORY — DX: Cardiac murmur, unspecified: R01.1

## 2022-07-24 HISTORY — DX: Other complications of anesthesia, initial encounter: T88.59XA

## 2022-07-24 LAB — HEMOGLOBIN A1C
Hgb A1c MFr Bld: 7.8 % — ABNORMAL HIGH (ref 4.8–5.6)
Mean Plasma Glucose: 177.16 mg/dL

## 2022-07-24 LAB — GLUCOSE, CAPILLARY: Glucose-Capillary: 239 mg/dL — ABNORMAL HIGH (ref 70–99)

## 2022-07-24 NOTE — Assessment & Plan Note (Signed)
EKG in 2020 did not have LBBB , urgent cardiology eval

## 2022-07-24 NOTE — Assessment & Plan Note (Signed)
Not at goal, medication compliance is stressed, not taking consistently DASH diet and commitment to daily physical activity for a minimum of 30 minutes discussed and encouraged, as a part of hypertension management. The importance of attaining a healthy weight is also discussed.     07/24/2022    8:00 AM 07/21/2022    2:13 PM 07/21/2022   11:34 AM 07/21/2022   10:58 AM 07/21/2022   10:54 AM 07/11/2022   11:51 AM 07/11/2022    5:11 AM  BP/Weight  Systolic BP 156 140 140 142 154 146 158  Diastolic BP 88 80 80 83 80 89 85  Wt. (Lbs) 185 189.5   191.12    BMI 31.76 kg/m2 32.53 kg/m2   32.81 kg/m2

## 2022-07-24 NOTE — Assessment & Plan Note (Addendum)
Afebrile and gradually regaining strength post hospitalization. Blood and urine cultures from recent hospitalization are both negative she is completing out patient keflex

## 2022-07-24 NOTE — Assessment & Plan Note (Signed)
Tube in place and functioning well, bandage changed over incision site , f/u with Urology already scheduled

## 2022-07-24 NOTE — Progress Notes (Addendum)
Caitlin Thompson     MRN: [id number]      DOB: [birth year]  Chief Complaint  Patient presents with   Hospitalization Follow-up    Hospital follow up , yeast infection from antibiotic , possible EKG     HPI Caitlin Thompson is here for [facility] visit hospitalized from 5/2 to 07/11/2022 dx of sepsis secondary to nephrolithiasis/ possible pyelonephritis, and obstructing stone in right ureteropelvic junction, also right hilar stone, received parenteral antibiotics and d/c home on keflex.Has nephrostomy tube in place C/O yeast vaginitis, recommend topical agent has prolonged QTinterval and needs rept EKG, also LBBB Reports weakness and fatigue, though she is back at work  ROS See HPI Hospital course is reviewed and questions answered Recommended follow up as direced done at visit EKG show prolonged QT and LBB not seen on prior EKG PE  BP (!) 140/80   Pulse (!) 54   Ht 5\' 4"  (1.626 m)   Wt 191 lb 1.9 oz (86.7 kg)   SpO2 98%   BMI 32.81 kg/m   Patient alert and oriented and in no cardiopulmonary distress.appears tired / fatigued  HEENT: No facial asymmetry, EOMI,     Neck supple .  Chest: Clear to auscultation bilaterally.  CVS: S1, S2 systolic  murmur, no S3.Regular rate.  ABD: drainage tube in place and functioning, abdomen mildly tender.   Ext: No edema  MS: Adequate ROM spine, shoulders, hips and knees.  Skin: Intact, no ulcerations or rash noted.  Psych: Good eye contact, normal affect. Memory intact not anxious or depressed appearing.  CNS: CN 2-12 intact, power,  normal throughout.no focal deficits noted.   Assessment & Plan  Severe sepsis (HCC) Afebrile and gradually regaining strength post hospitalization. Blood and urine cultures from recent hospitalization are both negative she is completing out patient keflex  Prolonged QT interval Rept EKG in office shows prolonged QT, urgent referral to Cardiology  LBBB (left bundle branch block) EKG in 2020 did not have LBBB ,  urgent cardiology eval  Obstructive nephropathy Tube in place and functioning well, bandage changed over incision site , f/u with Urology already scheduled  Encounter for support and coordination of transition of care Patient in for follow up of recent hospitalization. Discharge summary, and laboratory and radiology data are reviewed, and any questions or concerns  are discussed. Specific issues requiring follow up are specifically addressed.   Type 2 diabetes mellitus with vascular disease (HCC) Controlled, no change in medication Caitlin Thompson is reminded of the importance of commitment to daily physical activity for 30 minutes or more, as able and the need to limit carbohydrate intake to 30 to 60 grams per meal to help with blood sugar control.   The need to take medication as prescribed, test blood sugar as directed, and to call between visits if there is a concern that blood sugar is uncontrolled is also discussed.   Caitlin Thompson is reminded of the importance of daily foot exam, annual eye examination, and good blood sugar, blood pressure and cholesterol control.     Latest Ref Rng & Units 07/21/2022   11:28 AM 07/11/2022    5:00 AM 07/10/2022    5:30 AM 07/09/2022    5:40 AM 07/08/2022    9:15 AM  Diabetic Labs  Creatinine 0.57 - 1.00 mg/dL 8.29  5.62  1.30  8.65  1.46       07/24/2022    8:00 AM 07/21/2022    2:13 PM 07/21/2022   11:34  AM 07/21/2022   10:58 AM 07/21/2022   10:54 AM 07/11/2022   11:51 AM 07/11/2022    5:11 AM  BP/Weight  Systolic BP 156 140 140 142 154 146 158  Diastolic BP 88 80 80 83 80 89 85  Wt. (Lbs) 185 189.5   191.12    BMI 31.76 kg/m2 32.53 kg/m2   32.81 kg/m2        Latest Ref Rng & Units 02/17/2022    3:00 PM 05/30/2019   12:00 AM  Foot/eye exam completion dates  Eye Exam No Retinopathy  Retinopathy      Foot Form Completion  Done      This result is from an external source.        Essential hypertension Not at goal, medication compliance is  stressed, not taking consistently DASH diet and commitment to daily physical activity for a minimum of 30 minutes discussed and encouraged, as a part of hypertension management. The importance of attaining a healthy weight is also discussed.     07/24/2022    8:00 AM 07/21/2022    2:13 PM 07/21/2022   11:34 AM 07/21/2022   10:58 AM 07/21/2022   10:54 AM 07/11/2022   11:51 AM 07/11/2022    5:11 AM  BP/Weight  Systolic BP 156 140 140 142 154 146 158  Diastolic BP 88 80 80 83 80 89 85  Wt. (Lbs) 185 189.5   191.12    BMI 31.76 kg/m2 32.53 kg/m2   32.81 kg/m2

## 2022-07-24 NOTE — Assessment & Plan Note (Signed)
Controlled, no change in medication Caitlin Thompson is reminded of the importance of commitment to daily physical activity for 30 minutes or more, as able and the need to limit carbohydrate intake to 30 to 60 grams per meal to help with blood sugar control.   The need to take medication as prescribed, test blood sugar as directed, and to call between visits if there is a concern that blood sugar is uncontrolled is also discussed.   Caitlin Thompson is reminded of the importance of daily foot exam, annual eye examination, and good blood sugar, blood pressure and cholesterol control.     Latest Ref Rng & Units 07/21/2022   11:28 AM 07/11/2022    5:00 AM 07/10/2022    5:30 AM 07/09/2022    5:40 AM 07/08/2022    9:15 AM  Diabetic Labs  Creatinine 0.57 - 1.00 mg/dL 4.69  6.29  5.28  4.13  1.46       07/24/2022    8:00 AM 07/21/2022    2:13 PM 07/21/2022   11:34 AM 07/21/2022   10:58 AM 07/21/2022   10:54 AM 07/11/2022   11:51 AM 07/11/2022    5:11 AM  BP/Weight  Systolic BP 156 140 140 142 154 146 158  Diastolic BP 88 80 80 83 80 89 85  Wt. (Lbs) 185 189.5   191.12    BMI 31.76 kg/m2 32.53 kg/m2   32.81 kg/m2        Latest Ref Rng & Units 02/17/2022    3:00 PM 05/30/2019   12:00 AM  Foot/eye exam completion dates  Eye Exam No Retinopathy  Retinopathy      Foot Form Completion  Done      This result is from an external source.

## 2022-07-24 NOTE — Assessment & Plan Note (Signed)
Patient in for follow up of recent hospitalization. Discharge summary, and laboratory and radiology data are reviewed, and any questions or concerns  are discussed. Specific issues requiring follow up are specifically addressed.  

## 2022-07-24 NOTE — Assessment & Plan Note (Signed)
Rept EKG in office shows prolonged QT, urgent referral to Cardiology

## 2022-07-25 ENCOUNTER — Ambulatory Visit (HOSPITAL_BASED_OUTPATIENT_CLINIC_OR_DEPARTMENT_OTHER): Payer: BC Managed Care – PPO

## 2022-07-25 ENCOUNTER — Encounter: Payer: Self-pay | Admitting: Cardiovascular Disease

## 2022-07-25 DIAGNOSIS — R1110 Vomiting, unspecified: Secondary | ICD-10-CM | POA: Diagnosis not present

## 2022-07-25 DIAGNOSIS — R112 Nausea with vomiting, unspecified: Secondary | ICD-10-CM | POA: Diagnosis not present

## 2022-07-25 DIAGNOSIS — N200 Calculus of kidney: Secondary | ICD-10-CM | POA: Diagnosis not present

## 2022-07-25 DIAGNOSIS — A419 Sepsis, unspecified organism: Secondary | ICD-10-CM | POA: Diagnosis not present

## 2022-07-25 DIAGNOSIS — Z841 Family history of disorders of kidney and ureter: Secondary | ICD-10-CM | POA: Diagnosis not present

## 2022-07-25 DIAGNOSIS — R32 Unspecified urinary incontinence: Secondary | ICD-10-CM | POA: Diagnosis present

## 2022-07-25 DIAGNOSIS — B9620 Unspecified Escherichia coli [E. coli] as the cause of diseases classified elsewhere: Secondary | ICD-10-CM | POA: Diagnosis not present

## 2022-07-25 DIAGNOSIS — E785 Hyperlipidemia, unspecified: Secondary | ICD-10-CM | POA: Diagnosis not present

## 2022-07-25 DIAGNOSIS — R9431 Abnormal electrocardiogram [ECG] [EKG]: Secondary | ICD-10-CM | POA: Diagnosis not present

## 2022-07-25 DIAGNOSIS — E119 Type 2 diabetes mellitus without complications: Secondary | ICD-10-CM | POA: Diagnosis not present

## 2022-07-25 DIAGNOSIS — E11649 Type 2 diabetes mellitus with hypoglycemia without coma: Secondary | ICD-10-CM | POA: Diagnosis not present

## 2022-07-25 DIAGNOSIS — E1159 Type 2 diabetes mellitus with other circulatory complications: Secondary | ICD-10-CM | POA: Diagnosis not present

## 2022-07-25 DIAGNOSIS — Z6830 Body mass index (BMI) 30.0-30.9, adult: Secondary | ICD-10-CM | POA: Diagnosis not present

## 2022-07-25 DIAGNOSIS — E1165 Type 2 diabetes mellitus with hyperglycemia: Secondary | ICD-10-CM | POA: Diagnosis not present

## 2022-07-25 DIAGNOSIS — Z833 Family history of diabetes mellitus: Secondary | ICD-10-CM | POA: Diagnosis not present

## 2022-07-25 DIAGNOSIS — K219 Gastro-esophageal reflux disease without esophagitis: Secondary | ICD-10-CM | POA: Diagnosis not present

## 2022-07-25 DIAGNOSIS — Z86010 Personal history of colonic polyps: Secondary | ICD-10-CM | POA: Diagnosis not present

## 2022-07-25 DIAGNOSIS — Z888 Allergy status to other drugs, medicaments and biological substances status: Secondary | ICD-10-CM | POA: Diagnosis not present

## 2022-07-25 DIAGNOSIS — Y831 Surgical operation with implant of artificial internal device as the cause of abnormal reaction of the patient, or of later complication, without mention of misadventure at the time of the procedure: Secondary | ICD-10-CM | POA: Diagnosis present

## 2022-07-25 DIAGNOSIS — E669 Obesity, unspecified: Secondary | ICD-10-CM | POA: Diagnosis not present

## 2022-07-25 DIAGNOSIS — Z936 Other artificial openings of urinary tract status: Secondary | ICD-10-CM | POA: Diagnosis not present

## 2022-07-25 DIAGNOSIS — R6520 Severe sepsis without septic shock: Secondary | ICD-10-CM | POA: Diagnosis not present

## 2022-07-25 DIAGNOSIS — N201 Calculus of ureter: Secondary | ICD-10-CM | POA: Diagnosis not present

## 2022-07-25 DIAGNOSIS — I700 Atherosclerosis of aorta: Secondary | ICD-10-CM | POA: Diagnosis not present

## 2022-07-25 DIAGNOSIS — Z885 Allergy status to narcotic agent status: Secondary | ICD-10-CM | POA: Diagnosis not present

## 2022-07-25 DIAGNOSIS — E876 Hypokalemia: Secondary | ICD-10-CM | POA: Diagnosis not present

## 2022-07-25 DIAGNOSIS — N10 Acute tubulo-interstitial nephritis: Secondary | ICD-10-CM | POA: Diagnosis not present

## 2022-07-25 DIAGNOSIS — Z794 Long term (current) use of insulin: Secondary | ICD-10-CM | POA: Diagnosis not present

## 2022-07-25 DIAGNOSIS — N202 Calculus of kidney with calculus of ureter: Secondary | ICD-10-CM | POA: Diagnosis not present

## 2022-07-25 DIAGNOSIS — N136 Pyonephrosis: Secondary | ICD-10-CM | POA: Diagnosis not present

## 2022-07-25 DIAGNOSIS — R109 Unspecified abdominal pain: Secondary | ICD-10-CM | POA: Diagnosis not present

## 2022-07-25 DIAGNOSIS — J9601 Acute respiratory failure with hypoxia: Secondary | ICD-10-CM | POA: Diagnosis not present

## 2022-07-25 DIAGNOSIS — E782 Mixed hyperlipidemia: Secondary | ICD-10-CM | POA: Diagnosis not present

## 2022-07-25 DIAGNOSIS — Z882 Allergy status to sulfonamides status: Secondary | ICD-10-CM | POA: Diagnosis not present

## 2022-07-25 DIAGNOSIS — I10 Essential (primary) hypertension: Secondary | ICD-10-CM | POA: Diagnosis not present

## 2022-07-25 DIAGNOSIS — Z8249 Family history of ischemic heart disease and other diseases of the circulatory system: Secondary | ICD-10-CM | POA: Diagnosis not present

## 2022-07-25 DIAGNOSIS — T83122A Displacement of urinary stent, initial encounter: Secondary | ICD-10-CM | POA: Diagnosis not present

## 2022-07-25 DIAGNOSIS — T383X5A Adverse effect of insulin and oral hypoglycemic [antidiabetic] drugs, initial encounter: Secondary | ICD-10-CM | POA: Diagnosis not present

## 2022-07-25 DIAGNOSIS — Z0389 Encounter for observation for other suspected diseases and conditions ruled out: Secondary | ICD-10-CM | POA: Diagnosis not present

## 2022-07-25 DIAGNOSIS — Z1152 Encounter for screening for COVID-19: Secondary | ICD-10-CM | POA: Diagnosis not present

## 2022-07-25 DIAGNOSIS — Z880 Allergy status to penicillin: Secondary | ICD-10-CM | POA: Diagnosis not present

## 2022-07-25 LAB — BMP8+EGFR
BUN: 16 mg/dL (ref 6–24)
Creatinine, Ser: 0.98 mg/dL (ref 0.57–1.00)
Glucose: 210 mg/dL — ABNORMAL HIGH (ref 70–99)
Potassium: 4.6 mmol/L (ref 3.5–5.2)
Sodium: 140 mmol/L (ref 134–144)
eGFR: 66 mL/min/{1.73_m2} (ref >59)

## 2022-07-25 LAB — ECHOCARDIOGRAM COMPLETE
Area-P 1/2: 5.42 cm2
S' Lateral: 2.6 cm

## 2022-07-25 LAB — CBC
Hemoglobin: 12.1 g/dL (ref 11.1–15.9)
MCH: 30.7 pg (ref 26.6–33.0)
MCHC: 32.7 g/dL (ref 31.5–35.7)
MCV: 94 fL (ref 79–97)
RBC: 3.94 x10E6/uL (ref 3.77–5.28)
RDW: 11.8 % (ref 11.7–15.4)
WBC: 7.9 10*3/uL (ref 3.4–10.8)

## 2022-07-25 LAB — MAGNESIUM: Magnesium: 1.8 mg/dL (ref 1.6–2.3)

## 2022-07-25 NOTE — Progress Notes (Signed)
Case: [id number] Date/Time: 07/27/22 0715   Procedure: CYSTOSCOPY, RIGHT URETEROSCOPYK, HOLMIUM LASER LITHOTRIPSY, RIGHT URETERAL STENT PLACEMENT, AND REMOVAL OF RIGHT NEPHROSTOMY TUBE (Right) - 90 MINUTES   Anesthesia type: General   Pre-op diagnosis: RIGHT URETERAL CALCULUS   Location: [facility] / [facility]   Surgeons: Crist Fat, MD       DISCUSSION: Caitlin Thompson is a 62 year old female who presents to PAT prior to surgery listed above.  Patient recently was admitted to the hospital due to sepsis secondary to nephrolithiasis.  She underwent IR guided nephrostomy tube placement and clinically improved and was discharged.  She is now scheduled for surgery above.  While hospitalized her EKG showed prolonged QT and new LBBB and she was referred to cardiology.  Patient followed up with cardiology on 5/17.  A systolic ejection murmur was noted on exam and echo was ordered.  There was low suspicion for an MI.  Repeat EKG showed prolonged QT has improved  Echo was completed 5/21. Per Dr. Wyline Mood: "Normal echo. Not concerned about LBBB"  VS: BP (!) 156/88   Temp 36.8 C (Oral)   Resp 16   Ht 5\' 4"  (1.626 m)   Wt 83.9 kg   SpO2 98%   BMI 31.76 kg/m   PROVIDERS: Kerri Perches, MD   LABS: Labs reviewed: Acceptable for surgery. (all labs ordered are listed, but only abnormal results are displayed)  Labs Reviewed  HEMOGLOBIN A1C - Abnormal; Notable for the following components:      Result Value   Hgb A1c MFr Bld 7.8 (*)    All other components within normal limits  GLUCOSE, CAPILLARY - Abnormal; Notable for the following components:   Glucose-Capillary 239 (*)    All other components within normal limits     IMAGES:  CT abdomen/pelvis w constrast 07/09/22:  IMPRESSION: 1. Satisfactory new percutaneous Right nephrostomy since the CT on 07/06/2022. Regressed right hydronephrosis and pararenal edema. The large 17 mm stone now located in the right renal hilum  surrounded by the nephrostomy pigtail. Ongoing inflammatory stranding along the right ureter to the pelvis.   2. Mild increased left pararenal edema, nonspecific. Stable lower pole developing staghorn calculus with no left hydronephrosis or evidence of pyelonephritis.   3. New small layering pleural effusions with bilateral lower lobe atelectasis.   4. Normal appendix. Diverticulosis of the large bowel. Aortic Atherosclerosis (ICD10-I70.0).   EKG 07/21/22:  Sinus rhythm LBBB  CV:  Echo 07/25/22:  IMPRESSIONS     1. Left ventricular ejection fraction, by estimation, is 50 to 55%. Left  ventricular ejection fraction by 3D volume is 51 %. The left ventricle has  low normal function. The left ventricle has no regional wall motion  abnormalities. Left ventricular  diastolic parameters are consistent with Grade I diastolic dysfunction  (impaired relaxation).   2. Right ventricular systolic function is normal. The right ventricular  size is normal.   3. The mitral valve is normal in structure. No evidence of mitral valve  regurgitation. No evidence of mitral stenosis.   4. The aortic valve is normal in structure. Aortic valve regurgitation is  not visualized. No aortic stenosis is present.   5. The inferior vena cava is normal in size with greater than 50%  respiratory variability, suggesting right atrial pressure of 3 mmHg.   Past Medical History:  Diagnosis Date   Adenomatous colon polyp 06/26/2011   05/17/11 Colonoscopy Dr Allayne Butcher adenoma Next colonoscopy 05/2016    Complication of  anesthesia    DERMATOMYCOSIS 05/23/2010   Qualifier: Diagnosis of  By: Lodema Hong MD, Caitlin Thompson     Diabetes mellitus type II 03/06/2005   GERD (gastroesophageal reflux disease) 04/03/2011   Non-critical Schatzki's ring, HH on EGD 05/17/11 Dr Jena Gauss    Heart murmur    Hematuria    History of kidney stones    HYDRONEPHROSIS, LEFT 08/30/2009   Qualifier: Diagnosis of  By: Garnette Czech PA, Dawn      HYPERLIPIDEMIA 12/01/2009   Qualifier: Diagnosis of  By: Garnette Czech PA, Dawn     Hypertension    Obesity (BMI 30.0-34.9) 07/15/2011   Renal lithiasis 08/04/2009   Hospitalized a Caitlin Thompson [facility] fr 3 days in June    Schatzki's ring    THYROID STIMULATING HORMONE, ABNORMAL 08/30/2009   Qualifier: Diagnosis of  By: Garnette Czech PA, Dawn     Tubular adenoma of colon 05/05/2011    Past Surgical History:  Procedure Laterality Date   COLONOSCOPY N/A 01/29/2019   Procedure: COLONOSCOPY;  Surgeon: Corbin Ade, MD;  [facility]: [facility];  Service: Endoscopy;  Laterality: N/A;  2:00   COLONOSCOPY W/ POLYPECTOMY  05/17/11   Rourk-Tubular adenoma colon, benign SB bx   ESOPHAGEAL DILATION  05/17/2011   Rourk-Hiatal hernia/Incomplete noncritical Schatzki's ring/HH   IR NEPHROSTOMY PLACEMENT RIGHT  07/07/2022   POLYPECTOMY  01/29/2019   Procedure: POLYPECTOMY;  Surgeon: Corbin Ade, MD;  Location: [facility];  Service: Endoscopy;;   WISDOM TOOTH EXTRACTION      MEDICATIONS:  amLODipine (NORVASC) 10 MG tablet   carvedilol (COREG) 6.25 MG tablet   diphenhydrAMINE (BENADRYL) 25 mg capsule   glucose blood (ONETOUCH VERIO) test strip   insulin NPH-regular Human (70-30) 100 UNIT/ML injection   OneTouch Delica Lancets 33G MISC   pantoprazole (PROTONIX) 40 MG tablet   potassium chloride SA (KLOR-CON M) 20 MEQ tablet   rosuvastatin (CRESTOR) 10 MG tablet   sodium chloride flush 0.9 % SOLN injection   No current facility-administered medications for this encounter.   Marcille Blanco [facility] Short Stay/Anesthesiology [facility] Phone [phone] 07/26/2022 10:26 AM

## 2022-07-26 NOTE — Anesthesia Preprocedure Evaluation (Addendum)
Anesthesia Evaluation  Patient identified by MRN, date of birth, ID band Patient awake    Reviewed: Allergy & Precautions, H&P , NPO status , Patient's Chart, lab work & pertinent test results  Airway Mallampati: II  TM Distance: >3 FB Neck ROM: Full    Dental no notable dental hx. (+) Partial Upper, Dental Advisory Given   Pulmonary neg pulmonary ROS   Pulmonary exam normal breath sounds clear to auscultation       Cardiovascular Exercise Tolerance: Good hypertension, Pt. on medications and Pt. on home beta blockers + dysrhythmias  Rhythm:Regular Rate:Normal     Neuro/Psych negative neurological ROS  negative psych ROS   GI/Hepatic Neg liver ROS,GERD  Medicated,,  Endo/Other  diabetes, Insulin Dependent    Renal/GU negative Renal ROS  negative genitourinary   Musculoskeletal   Abdominal   Peds  Hematology negative hematology ROS (+)   Anesthesia Other Findings   Reproductive/Obstetrics negative OB ROS                             Anesthesia Physical Anesthesia Plan  ASA: 3  Anesthesia Plan: General   Post-op Pain Management: Tylenol PO (pre-op)*   Induction: Intravenous  PONV Risk Score and Plan: 4 or greater and Ondansetron, Dexamethasone and Midazolam  Airway Management Planned: LMA  Additional Equipment:   Intra-op Plan:   Post-operative Plan: Extubation in OR  Informed Consent: I have reviewed the patients History and Physical, chart, labs and discussed the procedure including the risks, benefits and alternatives for the proposed anesthesia with the patient or authorized representative who has indicated his/her understanding and acceptance.     Dental advisory given  Plan Discussed with: CRNA  Anesthesia Plan Comments: (See PAT note from 5/20 by Sherlie Ban PA-C)        Anesthesia Quick Evaluation

## 2022-07-27 ENCOUNTER — Encounter (HOSPITAL_COMMUNITY): Payer: Self-pay | Admitting: Emergency Medicine

## 2022-07-27 ENCOUNTER — Inpatient Hospital Stay (HOSPITAL_COMMUNITY)
Admission: EM | Admit: 2022-07-27 | Discharge: 2022-07-31 | DRG: 854 | Disposition: A | Discharge status: Home / Self Care | Payer: BC Managed Care – PPO | Attending: Family Medicine | Admitting: Family Medicine

## 2022-07-27 ENCOUNTER — Ambulatory Visit (HOSPITAL_COMMUNITY)
Admission: RE | Admit: 2022-07-27 | Discharge: 2022-07-27 | Disposition: A | Discharge status: Home / Self Care | Payer: BC Managed Care – PPO | Source: Home / Self Care | Attending: Urology | Admitting: Urology

## 2022-07-27 ENCOUNTER — Encounter (HOSPITAL_COMMUNITY): Payer: Self-pay | Admitting: Urology

## 2022-07-27 ENCOUNTER — Other Ambulatory Visit: Payer: Self-pay

## 2022-07-27 ENCOUNTER — Emergency Department (HOSPITAL_COMMUNITY): Payer: BC Managed Care – PPO

## 2022-07-27 ENCOUNTER — Ambulatory Visit (HOSPITAL_COMMUNITY): Payer: BC Managed Care – PPO

## 2022-07-27 ENCOUNTER — Encounter (HOSPITAL_COMMUNITY)
Admission: RE | Disposition: A | Discharge status: Home / Self Care | Payer: Self-pay | Source: Home / Self Care | Attending: Urology

## 2022-07-27 ENCOUNTER — Ambulatory Visit (HOSPITAL_COMMUNITY): Payer: BC Managed Care – PPO | Admitting: Certified Registered"

## 2022-07-27 ENCOUNTER — Ambulatory Visit (HOSPITAL_COMMUNITY): Payer: BC Managed Care – PPO | Admitting: Medical

## 2022-07-27 DIAGNOSIS — I10 Essential (primary) hypertension: Secondary | ICD-10-CM

## 2022-07-27 DIAGNOSIS — Z6830 Body mass index (BMI) 30.0-30.9, adult: Secondary | ICD-10-CM

## 2022-07-27 DIAGNOSIS — E1165 Type 2 diabetes mellitus with hyperglycemia: Secondary | ICD-10-CM | POA: Diagnosis present

## 2022-07-27 DIAGNOSIS — N200 Calculus of kidney: Secondary | ICD-10-CM

## 2022-07-27 DIAGNOSIS — Z882 Allergy status to sulfonamides status: Secondary | ICD-10-CM

## 2022-07-27 DIAGNOSIS — Z79899 Other long term (current) drug therapy: Secondary | ICD-10-CM | POA: Insufficient documentation

## 2022-07-27 DIAGNOSIS — B9620 Unspecified Escherichia coli [E. coli] as the cause of diseases classified elsewhere: Secondary | ICD-10-CM | POA: Diagnosis present

## 2022-07-27 DIAGNOSIS — N201 Calculus of ureter: Secondary | ICD-10-CM | POA: Diagnosis not present

## 2022-07-27 DIAGNOSIS — Z1152 Encounter for screening for COVID-19: Secondary | ICD-10-CM

## 2022-07-27 DIAGNOSIS — E1159 Type 2 diabetes mellitus with other circulatory complications: Secondary | ICD-10-CM | POA: Diagnosis present

## 2022-07-27 DIAGNOSIS — R112 Nausea with vomiting, unspecified: Secondary | ICD-10-CM

## 2022-07-27 DIAGNOSIS — N132 Hydronephrosis with renal and ureteral calculous obstruction: Secondary | ICD-10-CM | POA: Insufficient documentation

## 2022-07-27 DIAGNOSIS — Z960 Presence of urogenital implants: Secondary | ICD-10-CM | POA: Diagnosis present

## 2022-07-27 DIAGNOSIS — Z936 Other artificial openings of urinary tract status: Secondary | ICD-10-CM | POA: Insufficient documentation

## 2022-07-27 DIAGNOSIS — E119 Type 2 diabetes mellitus without complications: Secondary | ICD-10-CM | POA: Diagnosis not present

## 2022-07-27 DIAGNOSIS — N202 Calculus of kidney with calculus of ureter: Secondary | ICD-10-CM | POA: Diagnosis present

## 2022-07-27 DIAGNOSIS — Z833 Family history of diabetes mellitus: Secondary | ICD-10-CM

## 2022-07-27 DIAGNOSIS — Y831 Surgical operation with implant of artificial internal device as the cause of abnormal reaction of the patient, or of later complication, without mention of misadventure at the time of the procedure: Secondary | ICD-10-CM | POA: Diagnosis present

## 2022-07-27 DIAGNOSIS — Z86010 Personal history of colonic polyps: Secondary | ICD-10-CM

## 2022-07-27 DIAGNOSIS — N136 Pyonephrosis: Secondary | ICD-10-CM | POA: Diagnosis present

## 2022-07-27 DIAGNOSIS — Z888 Allergy status to other drugs, medicaments and biological substances status: Secondary | ICD-10-CM

## 2022-07-27 DIAGNOSIS — Z8249 Family history of ischemic heart disease and other diseases of the circulatory system: Secondary | ICD-10-CM

## 2022-07-27 DIAGNOSIS — A419 Sepsis, unspecified organism: Principal | ICD-10-CM | POA: Diagnosis present

## 2022-07-27 DIAGNOSIS — N10 Acute tubulo-interstitial nephritis: Secondary | ICD-10-CM | POA: Diagnosis present

## 2022-07-27 DIAGNOSIS — R32 Unspecified urinary incontinence: Secondary | ICD-10-CM | POA: Diagnosis present

## 2022-07-27 DIAGNOSIS — E785 Hyperlipidemia, unspecified: Secondary | ICD-10-CM | POA: Diagnosis present

## 2022-07-27 DIAGNOSIS — T383X5A Adverse effect of insulin and oral hypoglycemic [antidiabetic] drugs, initial encounter: Secondary | ICD-10-CM | POA: Diagnosis not present

## 2022-07-27 DIAGNOSIS — Z794 Long term (current) use of insulin: Secondary | ICD-10-CM | POA: Insufficient documentation

## 2022-07-27 DIAGNOSIS — E782 Mixed hyperlipidemia: Secondary | ICD-10-CM | POA: Diagnosis present

## 2022-07-27 DIAGNOSIS — E11649 Type 2 diabetes mellitus with hypoglycemia without coma: Secondary | ICD-10-CM | POA: Diagnosis not present

## 2022-07-27 DIAGNOSIS — Z841 Family history of disorders of kidney and ureter: Secondary | ICD-10-CM

## 2022-07-27 DIAGNOSIS — N138 Other obstructive and reflux uropathy: Secondary | ICD-10-CM

## 2022-07-27 DIAGNOSIS — Z885 Allergy status to narcotic agent status: Secondary | ICD-10-CM

## 2022-07-27 DIAGNOSIS — E876 Hypokalemia: Secondary | ICD-10-CM | POA: Diagnosis present

## 2022-07-27 DIAGNOSIS — E669 Obesity, unspecified: Secondary | ICD-10-CM | POA: Diagnosis present

## 2022-07-27 DIAGNOSIS — K219 Gastro-esophageal reflux disease without esophagitis: Secondary | ICD-10-CM | POA: Diagnosis present

## 2022-07-27 DIAGNOSIS — T83122A Displacement of urinary stent, initial encounter: Secondary | ICD-10-CM | POA: Diagnosis not present

## 2022-07-27 DIAGNOSIS — Z880 Allergy status to penicillin: Secondary | ICD-10-CM

## 2022-07-27 HISTORY — PX: CYSTOSCOPY/URETEROSCOPY/HOLMIUM LASER/STENT PLACEMENT: SHX6546

## 2022-07-27 LAB — COMPREHENSIVE METABOLIC PANEL
ALT: 19 U/L (ref 0–44)
AST: 26 U/L (ref 15–41)
Albumin: 3.7 g/dL (ref 3.5–5.0)
Alkaline Phosphatase: 74 U/L (ref 38–126)
Anion gap: 15 (ref 5–15)
BUN: 18 mg/dL (ref 6–20)
CO2: 20 mmol/L — ABNORMAL LOW (ref 22–32)
Calcium: 9.5 mg/dL (ref 8.9–10.3)
Chloride: 98 mmol/L (ref 98–111)
Creatinine, Ser: 1.16 mg/dL — ABNORMAL HIGH (ref 0.44–1.00)
GFR, Estimated: 54 mL/min — ABNORMAL LOW (ref >60)
Glucose, Bld: 305 mg/dL — ABNORMAL HIGH (ref 70–99)
Potassium: 4.6 mmol/L (ref 3.5–5.1)
Sodium: 133 mmol/L — ABNORMAL LOW (ref 135–145)
Total Bilirubin: 0.9 mg/dL (ref 0.3–1.2)
Total Protein: 8.6 g/dL — ABNORMAL HIGH (ref 6.5–8.1)

## 2022-07-27 LAB — CBC
HCT: 40 % (ref 36.0–46.0)
Hemoglobin: 13.3 g/dL (ref 12.0–15.0)
MCH: 30.9 pg (ref 26.0–34.0)
MCHC: 33.3 g/dL (ref 30.0–36.0)
MCV: 92.8 fL (ref 80.0–100.0)
Platelets: 312 10*3/uL (ref 150–400)
RBC: 4.31 MIL/uL (ref 3.87–5.11)
RDW: 11.9 % (ref 11.5–15.5)
WBC: 10.5 10*3/uL (ref 4.0–10.5)
nRBC: 0 % (ref 0.0–0.2)

## 2022-07-27 LAB — GLUCOSE, CAPILLARY
Glucose-Capillary: 237 mg/dL — ABNORMAL HIGH (ref 70–99)
Glucose-Capillary: 156 mg/dL — ABNORMAL HIGH (ref 70–99)

## 2022-07-27 LAB — LIPASE, BLOOD: Lipase: 22 U/L (ref 11–51)

## 2022-07-27 LAB — RESP PANEL BY RT-PCR (RSV, FLU A&B, COVID)  RVPGX2
Influenza A by PCR: NEGATIVE
Influenza B by PCR: NEGATIVE
Resp Syncytial Virus by PCR: NEGATIVE
SARS Coronavirus 2 by RT PCR: NEGATIVE

## 2022-07-27 LAB — PROTIME-INR
INR: 1 (ref 0.8–1.2)
Prothrombin Time: 13.6 seconds (ref 11.4–15.2)

## 2022-07-27 LAB — LACTIC ACID, PLASMA: Lactic Acid, Venous: 2.5 mmol/L (ref 0.5–1.9)

## 2022-07-27 LAB — APTT: aPTT: 28 seconds (ref 24–36)

## 2022-07-27 LAB — CULTURE, BLOOD (ROUTINE X 2): Culture: NO GROWTH

## 2022-07-27 SURGERY — CYSTOSCOPY/URETEROSCOPY/HOLMIUM LASER/STENT PLACEMENT
Anesthesia: General | Site: Ureter | Laterality: Right

## 2022-07-27 MED ORDER — INSULIN ASPART 100 UNIT/ML IJ SOLN
6.0000 [IU] | Freq: Once | INTRAMUSCULAR | Status: AC
  Administered 2022-07-27: 6 [IU] via SUBCUTANEOUS
  Filled 2022-07-27: qty 1

## 2022-07-27 MED ORDER — FENTANYL CITRATE (PF) 100 MCG/2ML IJ SOLN
INTRAMUSCULAR | Status: DC | PRN
  Administered 2022-07-27 (×3): 25 ug via INTRAVENOUS
  Administered 2022-07-27: 50 ug via INTRAVENOUS
  Administered 2022-07-27: 25 ug via INTRAVENOUS

## 2022-07-27 MED ORDER — IOHEXOL 300 MG/ML  SOLN
100.0000 mL | Freq: Once | INTRAMUSCULAR | Status: AC | PRN
  Administered 2022-07-27: 100 mL via INTRAVENOUS

## 2022-07-27 MED ORDER — LACTATED RINGERS IV BOLUS (SEPSIS)
1000.0000 mL | Freq: Once | INTRAVENOUS | Status: AC
  Administered 2022-07-28: 1000 mL via INTRAVENOUS

## 2022-07-27 MED ORDER — PHENYLEPHRINE 80 MCG/ML (10ML) SYRINGE FOR IV PUSH (FOR BLOOD PRESSURE SUPPORT)
PREFILLED_SYRINGE | INTRAVENOUS | Status: AC
  Filled 2022-07-27: qty 10

## 2022-07-27 MED ORDER — LIDOCAINE HCL (PF) 2 % IJ SOLN
INTRAMUSCULAR | Status: AC
  Filled 2022-07-27: qty 5

## 2022-07-27 MED ORDER — CIPROFLOXACIN IN D5W 400 MG/200ML IV SOLN
400.0000 mg | INTRAVENOUS | Status: AC
  Administered 2022-07-27: 400 mg via INTRAVENOUS
  Filled 2022-07-27: qty 200

## 2022-07-27 MED ORDER — IOHEXOL 300 MG/ML  SOLN
INTRAMUSCULAR | Status: DC | PRN
  Administered 2022-07-27: 10 mL

## 2022-07-27 MED ORDER — TRAMADOL HCL 50 MG PO TABS: 50.0000 mg | ORAL_TABLET | Freq: Four times a day (QID) | ORAL | 0 refills | Status: DC | PRN

## 2022-07-27 MED ORDER — PHENYLEPHRINE 80 MCG/ML (10ML) SYRINGE FOR IV PUSH (FOR BLOOD PRESSURE SUPPORT)
PREFILLED_SYRINGE | INTRAVENOUS | Status: DC | PRN
  Administered 2022-07-27 (×3): 80 ug via INTRAVENOUS
  Administered 2022-07-27: 120 ug via INTRAVENOUS
  Administered 2022-07-27 (×2): 80 ug via INTRAVENOUS

## 2022-07-27 MED ORDER — LACTATED RINGERS IV BOLUS: 1000.0000 mL | Freq: Once | INTRAVENOUS | Status: DC

## 2022-07-27 MED ORDER — AMISULPRIDE (ANTIEMETIC) 5 MG/2ML IV SOLN
INTRAVENOUS | Status: AC
  Administered 2022-07-27: 5 mg via INTRAVENOUS
  Filled 2022-07-27: qty 2

## 2022-07-27 MED ORDER — LIDOCAINE 2% (20 MG/ML) 5 ML SYRINGE
INTRAMUSCULAR | Status: DC | PRN
  Administered 2022-07-27: 60 mg via INTRAVENOUS

## 2022-07-27 MED ORDER — CARVEDILOL 6.25 MG PO TABS
6.2500 mg | ORAL_TABLET | Freq: Once | ORAL | Status: AC
  Administered 2022-07-27: 6.25 mg via ORAL
  Filled 2022-07-27: qty 1

## 2022-07-27 MED ORDER — ORAL CARE MOUTH RINSE: 15.0000 mL | Freq: Once | OROMUCOSAL | Status: AC

## 2022-07-27 MED ORDER — SODIUM CHLORIDE 0.9 % IV SOLN
2.0000 g | Freq: Once | INTRAVENOUS | Status: DC
  Administered 2022-07-27: 2 g via INTRAVENOUS
  Filled 2022-07-27: qty 10

## 2022-07-27 MED ORDER — INSULIN ASPART 100 UNIT/ML IJ SOLN: 5.0000 [IU] | Freq: Once | INTRAMUSCULAR | Status: DC

## 2022-07-27 MED ORDER — CHLORHEXIDINE GLUCONATE 0.12 % MT SOLN
15.0000 mL | Freq: Once | OROMUCOSAL | Status: AC
  Administered 2022-07-27: 15 mL via OROMUCOSAL

## 2022-07-27 MED ORDER — CIPROFLOXACIN HCL 500 MG PO TABS: 500.0000 mg | ORAL_TABLET | Freq: Once | ORAL | 0 refills | Status: DC

## 2022-07-27 MED ORDER — LACTATED RINGERS IV SOLN: INTRAVENOUS | Status: DC

## 2022-07-27 MED ORDER — FENTANYL CITRATE (PF) 100 MCG/2ML IJ SOLN
INTRAMUSCULAR | Status: AC
  Filled 2022-07-27: qty 2

## 2022-07-27 MED ORDER — ACETAMINOPHEN 500 MG PO TABS
1000.0000 mg | ORAL_TABLET | Freq: Once | ORAL | Status: AC
  Administered 2022-07-27: 1000 mg via ORAL
  Filled 2022-07-27: qty 2

## 2022-07-27 MED ORDER — PROPOFOL 10 MG/ML IV BOLUS
INTRAVENOUS | Status: AC
  Filled 2022-07-27: qty 20

## 2022-07-27 MED ORDER — MIDAZOLAM HCL 2 MG/2ML IJ SOLN
0.5000 mg | Freq: Once | INTRAMUSCULAR | Status: AC
  Administered 2022-07-27: 0.5 mg via INTRAVENOUS
  Filled 2022-07-27: qty 2

## 2022-07-27 MED ORDER — LACTATED RINGERS IV BOLUS (SEPSIS)
1000.0000 mL | Freq: Once | INTRAVENOUS | Status: AC
  Administered 2022-07-27: 1000 mL via INTRAVENOUS

## 2022-07-27 MED ORDER — ONDANSETRON HCL 4 MG/2ML IJ SOLN
INTRAMUSCULAR | Status: AC
  Filled 2022-07-27: qty 2

## 2022-07-27 MED ORDER — METOCLOPRAMIDE HCL 5 MG/ML IJ SOLN
10.0000 mg | Freq: Once | INTRAMUSCULAR | Status: AC
  Administered 2022-07-27: 10 mg via INTRAVENOUS
  Filled 2022-07-27: qty 2

## 2022-07-27 MED ORDER — MIDAZOLAM HCL 2 MG/2ML IJ SOLN
INTRAMUSCULAR | Status: AC
  Filled 2022-07-27: qty 2

## 2022-07-27 MED ORDER — ONDANSETRON HCL 4 MG/2ML IJ SOLN
INTRAMUSCULAR | Status: DC | PRN
  Administered 2022-07-27: 4 mg via INTRAVENOUS

## 2022-07-27 MED ORDER — FENTANYL CITRATE PF 50 MCG/ML IJ SOSY: 25.0000 ug | PREFILLED_SYRINGE | INTRAMUSCULAR | Status: DC | PRN

## 2022-07-27 MED ORDER — DEXAMETHASONE SODIUM PHOSPHATE 10 MG/ML IJ SOLN
INTRAMUSCULAR | Status: AC
  Filled 2022-07-27: qty 1

## 2022-07-27 MED ORDER — AMISULPRIDE (ANTIEMETIC) 5 MG/2ML IV SOLN: 5.0000 mg | Freq: Once | INTRAVENOUS | Status: AC

## 2022-07-27 MED ORDER — MIDAZOLAM HCL 2 MG/2ML IJ SOLN
INTRAMUSCULAR | Status: DC | PRN
  Administered 2022-07-27: 2 mg via INTRAVENOUS

## 2022-07-27 MED ORDER — SODIUM CHLORIDE 0.9 % IV SOLN
1.0000 g | Freq: Every day | INTRAVENOUS | Status: DC
  Administered 2022-07-28: 1 g via INTRAVENOUS
  Filled 2022-07-27: qty 10

## 2022-07-27 MED ORDER — PHENAZOPYRIDINE HCL 200 MG PO TABS: 200.0000 mg | ORAL_TABLET | Freq: Three times a day (TID) | ORAL | 0 refills | Status: DC | PRN

## 2022-07-27 MED ORDER — PROMETHAZINE HCL 25 MG/ML IJ SOLN: 6.2500 mg | Freq: Once | INTRAMUSCULAR | Status: AC | PRN

## 2022-07-27 MED ORDER — PROMETHAZINE HCL 25 MG/ML IJ SOLN
INTRAMUSCULAR | Status: AC
  Administered 2022-07-27: 6.25 mg via INTRAVENOUS
  Filled 2022-07-27: qty 1

## 2022-07-27 MED ORDER — SODIUM CHLORIDE 0.9 % IR SOLN
Status: DC | PRN
  Administered 2022-07-27 (×2): 3000 mL

## 2022-07-27 MED ORDER — DEXAMETHASONE SODIUM PHOSPHATE 10 MG/ML IJ SOLN
INTRAMUSCULAR | Status: DC | PRN
  Administered 2022-07-27: 4 mg via INTRAVENOUS

## 2022-07-27 MED ORDER — PROPOFOL 10 MG/ML IV BOLUS
INTRAVENOUS | Status: DC | PRN
  Administered 2022-07-27: 120 mg via INTRAVENOUS

## 2022-07-27 SURGICAL SUPPLY — 25 items
BAG URO CATCHER STRL LF (MISCELLANEOUS) ×2 IMPLANT
BASKET ZERO TIP NITINOL 2.4FR (BASKET) IMPLANT
BSKT STON RTRVL ZERO TP 2.4FR (BASKET)
CATH URETERAL DUAL LUMEN 10F (MISCELLANEOUS) IMPLANT
CATH URETL OPEN 5X70 (CATHETERS) ×2 IMPLANT
CLOTH BEACON ORANGE TIMEOUT ST (SAFETY) ×2 IMPLANT
EXTRACTOR STONE 1.7FRX115CM (UROLOGICAL SUPPLIES) IMPLANT
GLOVE SURG LX STRL 7.5 STRW (GLOVE) ×2 IMPLANT
GOWN STRL REUS W/ TWL XL LVL3 (GOWN DISPOSABLE) ×2 IMPLANT
GOWN STRL REUS W/TWL XL LVL3 (GOWN DISPOSABLE) ×1
GUIDEWIRE ANG ZIPWIRE 038X150 (WIRE) IMPLANT
GUIDEWIRE STR DUAL SENSOR (WIRE) ×2 IMPLANT
KIT TURNOVER KIT A (KITS) IMPLANT
LASER FIB FLEXIVA PULSE ID 365 (Laser) IMPLANT
MANIFOLD NEPTUNE II (INSTRUMENTS) ×2 IMPLANT
PACK CYSTO (CUSTOM PROCEDURE TRAY) ×2 IMPLANT
SHEATH NAVIGATOR HD 11/13X36 (SHEATH) IMPLANT
SHEATH NAVIGATOR HD 12/14X28 (SHEATH) IMPLANT
SHEATH NAVIGATOR HD 12/14X36 (SHEATH) IMPLANT
STENT URET 6FRX24 CONTOUR (STENTS) IMPLANT
TRACTIP FLEXIVA PULS ID 200XHI (Laser) IMPLANT
TRACTIP FLEXIVA PULSE ID 200 (Laser) ×1 IMPLANT
TUBE CONNECTING VINYL 14FR 30C (TUBING) IMPLANT
TUBING CONNECTING 10 (TUBING) ×2 IMPLANT
TUBING UROLOGY SET (TUBING) ×2 IMPLANT

## 2022-07-27 NOTE — Discharge Instructions (Signed)
DISCHARGE INSTRUCTIONS FOR KIDNEY STONE/URETERAL STENT   MEDICATIONS:  1. Resume all your other meds from home - except do not take any extra narcotic pain meds that you may have at home.  2. Pyridium is to help with the burning/stinging when you urinate. 3. Tramadol is for moderate/severe pain, otherwise taking upto 1000 mg every 6 hours of plainTylenol will help treat your pain.   4. Take Cipro one hour prior to removal of your stent.   ACTIVITY:  1. No strenuous activity x 1week  2. No driving while on narcotic pain medications  3. Drink plenty of water  4. Continue to walk at home - you can still get blood clots when you are at home, so keep active, but don't over do it.  5. May return to work/school tomorrow or when you feel ready   BATHING:  1. You can shower and we recommend daily showers  2. You have a string coming from your urethra: The stent string is attached to your ureteral stent. Do not pull on this.   SIGNS/SYMPTOMS TO CALL:  Please call us if you have a fever greater than 101.5, uncontrolled nausea/vomiting, uncontrolled pain, dizziness, unable to urinate, bloody urine, chest pain, shortness of breath, leg swelling, leg pain, redness around wound, drainage from wound, or any other concerns or questions.   You can reach [place] at [phone].   FOLLOW-UP:  1. You have an appointment in 6 weeks with a ultrasound of your kidneys prior. 2. You have a string attached to your stent, you may remove it on May 28th. To do this, pull the strings until the stents are completely removed. You may feel an odd sensation in your back.

## 2022-07-27 NOTE — ED Provider Notes (Signed)
[place] EMERGENCY DEPARTMENT AT [facility] Provider Note   CSN: [id number] Arrival date & time: 07/27/22  2143     History Chief Complaint  Patient presents with   Post-op Problem    Caitlin Thompson is a 62 y.o. female.  Patient presents emergency department complaints of vomiting.  Patient reports that she had surgery this morning with urology for a right ureteral stent placement as well as removal of right nephrostomy tube. States that she was discharged home without significant difficulty, but began to feel warm at home. Soon after, nausea and vomiting began with patient unable to tolerate any PO intake.  States she was discharged with pyridium, ciprofloxacin, and tramadol home. Has been unable to tolerate any oral medications since arriving home.  Urologist was Dr. Marlou Porch with [facility].  HPI     Home Medications Prior to Admission medications   Medication Sig Start Date End Date Taking? Authorizing Provider  amLODipine (NORVASC) 10 MG tablet Take 1 tablet (10 mg total) by mouth daily. 07/21/22   Kerri Perches, MD  carvedilol (COREG) 6.25 MG tablet Take 1 tablet (6.25 mg total) by mouth 2 (two) times daily with a meal. 07/21/22   Kerri Perches, MD  ciprofloxacin (CIPRO) 500 MG tablet Take 1 tablet (500 mg total) by mouth once for 1 dose. Take one hour prior to stent removal 07/27/22 07/27/22  Crist Fat, MD  diphenhydrAMINE (BENADRYL) 25 mg capsule Take 1 capsule (25 mg total) by mouth every 6 (six) hours as needed for itching or allergies. 07/11/22   Burnadette Pop, MD  glucose blood (ONETOUCH VERIO) test strip USE 1 STRIP TO CHECK GLUCOSE THREE TIMES DAILY 05/17/22   Kerri Perches, MD  insulin NPH-regular Human (70-30) 100 UNIT/ML injection Take 40  units in the morning and take 10 units in the evening Patient taking differently: Inject 0-40 Units into the skin daily. 08/05/21   Kerri Perches, MD  OneTouch Delica Lancets 33G  MISC Three times daily testing dx e11.65 03/24/20   Kerri Perches, MD  pantoprazole (PROTONIX) 40 MG tablet Take 1 tablet (40 mg total) by mouth daily. 07/11/22   Burnadette Pop, MD  phenazopyridine (PYRIDIUM) 200 MG tablet Take 1 tablet (200 mg total) by mouth 3 (three) times daily as needed for pain. 07/27/22   Crist Fat, MD  potassium chloride SA (KLOR-CON M) 20 MEQ tablet Take 2 tablets (40 mEq total) by mouth daily for 3 doses. Patient not taking: Reported on 07/21/2022 07/12/22 07/15/22  Burnadette Pop, MD  rosuvastatin (CRESTOR) 10 MG tablet Take 1 tablet (10 mg total) by mouth daily. 09/28/20   Kerri Perches, MD  sodium chloride flush 0.9 % SOLN injection Flush nephrostomy drain once daily with 5 mL of saline 07/08/22   Kennieth Francois, PA  traMADol (ULTRAM) 50 MG tablet Take 1-2 tablets (50-100 mg total) by mouth every 6 (six) hours as needed for moderate pain. 07/27/22   Crist Fat, MD      Allergies    Codeine, Fluconazole, Parlodel [bromocriptine mesylate], Penicillins, Sulfa antibiotics, and Sulfonamide derivatives    Review of Systems   Review of Systems  Constitutional:  Positive for fever.  All other systems reviewed and are negative.   Physical Exam Updated Vital Signs BP (!) 180/89 (BP Location: Left Arm)   Pulse (!) 115   Temp (!) 100.5 F (38.1 C) (Oral)   Resp (!) 23   Ht 5\' 4"  (  1.626 m)   Wt 83.9 kg   LMP 02/22/2015   SpO2 100%   BMI 31.76 kg/m  Physical Exam Vitals and nursing note reviewed.  Constitutional:      General: She is not in acute distress.    Appearance: She is well-developed.  HENT:     Head: Normocephalic and atraumatic.  Eyes:     Conjunctiva/sclera: Conjunctivae normal.  Cardiovascular:     Rate and Rhythm: Normal rate and regular rhythm.     Heart sounds: No murmur heard. Pulmonary:     Effort: Pulmonary effort is normal. No respiratory distress.     Breath sounds: Normal breath sounds.  Abdominal:      Tenderness: There is generalized abdominal tenderness.  Musculoskeletal:        General: No swelling.     Cervical back: Neck supple.  Skin:    General: Skin is warm and dry.     Capillary Refill: Capillary refill takes 2 to 3 seconds.  Neurological:     Mental Status: She is alert.  Psychiatric:        Mood and Affect: Mood normal.     ED Results / Procedures / Treatments   Labs (all labs ordered are listed, but only abnormal results are displayed) Labs Reviewed  COMPREHENSIVE METABOLIC PANEL - Abnormal; Notable for the following components:      Result Value   Sodium 133 (*)    CO2 20 (*)    Glucose, Bld 305 (*)    Creatinine, Ser 1.16 (*)    Total Protein 8.6 (*)    GFR, Estimated 54 (*)    All other components within normal limits  LACTIC ACID, PLASMA - Abnormal; Notable for the following components:   Lactic Acid, Venous 2.5 (*)    All other components within normal limits  RESP PANEL BY RT-PCR (RSV, FLU A&B, COVID)  RVPGX2  CULTURE, BLOOD (ROUTINE X 2)  CULTURE, BLOOD (ROUTINE X 2)  LIPASE, BLOOD  CBC  PROTIME-INR  APTT  LACTIC ACID, PLASMA  URINALYSIS, W/ REFLEX TO CULTURE (INFECTION SUSPECTED)    EKG None  Radiology DG Chest Port 1 View  Result Date: 07/27/2022 CLINICAL DATA:  Possible sepsis EXAM: PORTABLE CHEST 1 VIEW COMPARISON:  07/06/2022 FINDINGS: The heart size and mediastinal contours are within normal limits. Aortic atherosclerosis. Both lungs are clear. The visualized skeletal structures are unremarkable. IMPRESSION: No active disease. Electronically Signed   By: Jasmine Pang M.D.   On: 07/27/2022 22:33   DG C-Arm 1-60 Min-No Report  Result Date: 07/27/2022 Fluoroscopy was utilized by the requesting physician.  No radiographic interpretation.   DG C-Arm 1-60 Min-No Report  Result Date: 07/27/2022 Fluoroscopy was utilized by the requesting physician.  No radiographic interpretation.    Procedures Procedures   Medications Ordered in  ED Medications  lactated ringers infusion ( Intravenous New Bag/Given 07/27/22 2311)  lactated ringers bolus 1,000 mL (1,000 mLs Intravenous New Bag/Given 07/27/22 2229)    And  lactated ringers bolus 1,000 mL (has no administration in time range)    And  lactated ringers bolus 1,000 mL (has no administration in time range)  cefTRIAXone (ROCEPHIN) 1 g in sodium chloride 0.9 % 100 mL IVPB (has no administration in time range)  metoCLOPramide (REGLAN) injection 10 mg (10 mg Intravenous Given 07/27/22 2223)  midazolam (VERSED) injection 0.5 mg (0.5 mg Intravenous Given 07/27/22 2234)  iohexol (OMNIPAQUE) 300 MG/ML solution 100 mL (100 mLs Intravenous Contrast Given 07/27/22 2319)  ED Course/ Medical Decision Making/ A&P                           Medical Decision Making Amount and/or Complexity of Data Reviewed Labs: ordered.  Risk Prescription drug management.   This patient presents to the ED for concern of post-op problem, this involves an extensive number of treatment options, and is a complaint that carries with it a high risk of complications and morbidity.  The differential diagnosis includes sepsis, urosepsis, bowel obstruction, nausea and vomiting, gastroenteritis   Co morbidities that complicate the patient evaluation  Acute pyelonephritis, type 2 diabetes, sepsis secondary to UTI   Lab Tests:  I Ordered, and personally interpreted labs.  The pertinent results include: CBC with borderline white blood count at 10.5, CMP with signs of dehydration with sodium down to 133, elevation in creatinine, lipase normal, APTT normal, PT-INR normal, respiratory viral panel pending, blood cultures pending, lactic acid pending, UA pending   Imaging Studies ordered:  I ordered imaging studies including chest x-ray I independently visualized and interpreted imaging which showed no acute cardiopulmonary process I agree with the radiologist interpretation   Cardiac Monitoring: /  EKG:  The patient was maintained on a cardiac monitor.  I personally viewed and interpreted the cardiac monitored which showed an underlying rhythm of: Prolonged QT, sinus rhythm   Problem List / ED Course / Critical interventions / Medication management  Patient presents emergency department with postoperative complaints.  She reports that she has been experiencing intractable nausea and vomiting since urologic procedure performed this morning.  Patient had stent placed as well as lithotripsy. States that she was discharged home in good health, but began to experience vomiting once she arrived and has been unable to control vomiting.States that she has also been experiencing abdominal pain which she describes as cramping and typically worsened with vomiting. Given concerning vitals on arrival and recent surgery, code sepsis initiated to evaluate patient. I ordered medication including Reglan, Versed, aztreonam, Rocephin, LR for nausea vomiting, suspected urosepsis, dehydration Reevaluation of the patient after these medicines showed that the patient improved I have reviewed the patients home medicines and have made adjustments as needed   Test / Admission - Considered:  I anticipate that this patient will be admitted to medicine once labs/imaging are resulted.   11:20 PM Care of Jerelyn Packard transferred to and Dr. Judd Lien at the end of my shift as the patient will require reassessment once labs/imaging have resulted. Patient presentation, ED course, and plan of care discussed with review of all pertinent labs and imaging. Please see his/her note for further details regarding further ED course and disposition. Plan at time of handoff is admit for sepsis. This may be altered or completely changed at the discretion of the oncoming team pending results of further workup.   Final Clinical Impression(s) / ED Diagnoses Final diagnoses:  None    Rx / DC Orders ED Discharge Orders     None          Smitty Knudsen, PA-C 07/27/22 2320    Geoffery Lyons, MD 07/28/22 0150

## 2022-07-27 NOTE — Anesthesia Procedure Notes (Signed)
Procedure Name: LMA Insertion Date/Time: 07/27/2022 7:43 AM  Performed by: Sindy Guadeloupe, CRNAPre-anesthesia Checklist: Patient identified, Emergency Drugs available, Suction available, Patient being monitored and Timeout performed Patient Re-evaluated:Patient Re-evaluated prior to induction Oxygen Delivery Method: Circle system utilized Preoxygenation: Pre-oxygenation with 100% oxygen Induction Type: IV induction Ventilation: Mask ventilation without difficulty LMA: LMA inserted LMA Size: 4.0 Number of attempts: 1 Tube secured with: Tape Dental Injury: Teeth and Oropharynx as per pre-operative assessment

## 2022-07-27 NOTE — Op Note (Signed)
Preoperative diagnosis:  Right renal pelvic stone, greater than 2 cm  Postoperative diagnosis:  Same  Procedure: Right retrograde pyelogram with interpretation Right ureteral stent placement Removal of right nephrostomy tube Right ureteroscopy, laser lithotripsy and stone dusting/removal  Surgeon: Crist Fat, MD  Anesthesia: General  Complications: None  Intraoperative findings:  #1: The patient's retrograde pyelogram demonstrated normal right ureter with no filling defects or abnormalities.  There is a large filling defect in the renal pelvis and the nephrostomy tube was noted to be inserting into an interpolar calyx. #2: The patient's stone was completely dusted.  A fragment at the end was removed and sent for stone analysis.  I triple checked the remaining parts of the collecting system and noted no large stone fragments. #3: 24 cm time 6 French double-J ureteral stent was left in the right ureter and the right nephrostomy tube removed at the end of the case.  EBL: Minimal  Specimens: Right renal pelvic stone  Indication: Caitlin Thompson is a 62 y.o. patient who presented to the emergency department with right-sided flank pain, fevers, and sepsis.  She was noted to have a 18 mm UPJ stone as well as 8 mm stone in the lower pole of the right kidney.  A nephrostomy tube was placed and the patient was treated aggressively with antibiotics.  After she recovered, she presented today for treatment of her stone.  After reviewing the management options for treatment, he elected to proceed with the above surgical procedure(s). We have discussed the potential benefits and risks of the procedure, side effects of the proposed treatment, the likelihood of the patient achieving the goals of the procedure, and any potential problems that might occur during the procedure or recuperation. Informed consent has been obtained.  Description of procedure:   Consent was obtained the  preoperative holding area.  She is then brought back to the operating room placed on the table in supine position.  General anesthesia was then induced endotracheal tube was inserted.  She was placed in the dorsolithotomy position and prepped and draped in the routine sterile fashion.  The nephrostomy tube was uncapped and an adapter placed so that this could be left open and drained into a Foley bag.  Timeout was performed.  A 21 French 30 degree cystoscope was then gently passed through the patient's urethra and into the bladder under vision guidance.  I did have to dilate the patient's urethral meatus with a 21 French obturator.  Once in the patient's bladder without the bladder appeared normal with orthotopic ureteral orifice ease.  There were no other significant abnormalities.  A wire was then advanced up into the right renal pelvis.  I then drained the bladder and remove the scope.  Over the wire I introduced a dual-lumen catheter into the distal ureter and performed retrograde pyelogram with 10 cc followed by contrast with the above findings.  I then advanced a second wire up to the dual-lumen catheter removing the catheter over the wire.  I then advanced a 11/13 French x 35 cm ureteral sheath over the second wire and under visual guidance with fluoroscopy advanced it up to the renal pelvis removing the inner portion and the wire.  I then advanced a flexible ureteroscope through the dual-lumen catheter and into the right renal pelvis.  I encountered the stone within the renal pelvis and using a 265 m laser fiber with dusting settings dusted the stone into bits.  At the very end there was a  small fragment that I was able to see grasped with the engage ureteral stone basket and removed through the access sheath.  This was sent for stone analysis.  I then double checked all the calyces to ensure that all the larger fragments had been broken up.  Once all the stone fragments had been adequately dusted I  slowly backed out the ureteroscope and the access sheath noting no significant ureteral trauma.  Prior to placing the stent I had the nurse remove the nephrostomy tube.  This was then dressed with Tegaderm and a 4 x 4 dressing.  Over the remaining safety wire advanced a 24 cm time 6 French double-J ureteral stent over the wire and into the right upper pole under fluoroscopic guidance.  Once it was at the urethral meatus I gently put pressure on the stent while backing out the wire noting nice curl within the upper pole.  Fluoroscopy confirmed a nice curl within the patient's bladder.  I subsequently emptied the patient's bladder and pulled the stent tether through the urethra and tucked into the patient's vagina.  Disposition: The patient is being discharged home.  She will be instructed to remove the stent on Tuesday, May 28.  She will then be scheduled for follow-up with a renal ultrasound in 6 weeks.

## 2022-07-27 NOTE — Transfer of Care (Signed)
Immediate Anesthesia Transfer of Care Note  Patient: Caitlin Thompson  Procedure(s) Performed: CYSTOSCOPY, RIGHT URETEROSCOPY, HOLMIUM LASER LITHOTRIPSY, RIGHT URETERAL STENT PLACEMENT, AND REMOVAL OF RIGHT NEPHROSTOMY TUBE (Right: Ureter)  Patient Location: PACU  Anesthesia Type:General  Level of Consciousness: awake, alert , and patient cooperative  Airway & Oxygen Therapy: Patient Spontanous Breathing and Patient connected to face mask oxygen  Post-op Assessment: Report given to RN and Post -op Vital signs reviewed and stable  Post vital signs: Reviewed and stable  Last Vitals:  Vitals Value Taken Time  BP 150/90 07/27/22 0902  Temp    Pulse 88 07/27/22 0903  Resp 13 07/27/22 0903  SpO2 100 % 07/27/22 0903  Vitals shown include unvalidated device data.  Last Pain:  Vitals:   07/27/22 0617  TempSrc:   PainSc: 0-No pain         Complications: No notable events documented.

## 2022-07-27 NOTE — Anesthesia Postprocedure Evaluation (Signed)
Anesthesia Post Note  Patient: Caitlin Thompson  Procedure(s) Performed: CYSTOSCOPY, RIGHT URETEROSCOPY, HOLMIUM LASER LITHOTRIPSY, RIGHT URETERAL STENT PLACEMENT, AND REMOVAL OF RIGHT NEPHROSTOMY TUBE (Right: Ureter)     Patient location during evaluation: PACU Anesthesia Type: General Level of consciousness: awake and alert Pain management: pain level controlled Vital Signs Assessment: post-procedure vital signs reviewed and stable Respiratory status: spontaneous breathing, nonlabored ventilation and respiratory function stable Cardiovascular status: blood pressure returned to baseline and stable Postop Assessment: no apparent nausea or vomiting Anesthetic complications: no  No notable events documented.  Last Vitals:  Vitals:   07/27/22 1030 07/27/22 1040  BP: (!) 163/82 (!) 171/83  Pulse: 83 94  Resp: 14   Temp: 36.9 C 37.1 C  SpO2: 95% 95%    Last Pain:  Vitals:   07/27/22 1040  TempSrc:   PainSc: 0-No pain                 Cinde Ebert,W. EDMOND

## 2022-07-27 NOTE — Interval H&P Note (Signed)
History and Physical Interval Note:  07/27/2022 7:36 AM  Caitlin Thompson  has presented today for surgery, with the diagnosis of RIGHT URETERAL CALCULUS.  The various methods of treatment have been discussed with the patient and family. After consideration of risks, benefits and other options for treatment, the patient has consented to  Procedure(s) with comments: CYSTOSCOPY, RIGHT URETEROSCOPYK, HOLMIUM LASER LITHOTRIPSY, RIGHT URETERAL STENT PLACEMENT, AND REMOVAL OF RIGHT NEPHROSTOMY TUBE (Right) - 90 MINUTES as a surgical intervention.  The patient's history has been reviewed, patient examined, no change in status, stable for surgery.  I have reviewed the patient's chart and labs.  Questions were answered to the patient's satisfaction.     Crist Fat

## 2022-07-27 NOTE — Sepsis Progress Note (Signed)
Elink monitoring for the code sepsis protocol.  

## 2022-07-27 NOTE — ED Triage Notes (Signed)
Pt via POV c/o post-operative n/v. She had a renal stone and nephrostomy tube removed and a stent placed on the right side. Since returning home this afternoon, she has been unable to stop vomiting and cannot tolerate PO intake. Estimates 14 episodes of vomiting this evening. Tachycardia and HTN noted in triage with temp 100.5

## 2022-07-27 NOTE — H&P (Signed)
HPI: Caitlin Thompson is a 62 y.o. female with medical history significant of hypertension, hyperlipidemia, T2DM who presents to the emergency department due to several episodes of vomiting which started today around 2 PM, she also complained of right flank pain and right lower abdominal pain.  She denies fever, chills, chest pain, shortness of breath.  She decided to go to the ED for further evaluation and management.   ED Course:  In the emergency department, BP was 164/72, respiratory rate 24/minute, other vital signs were within normal range.  Workup in the ED showed normal CBC except for WBC of 18.2 and MCV of 101.4, BMP showed sodium 135, potassium 3.3, chloride 100, bicarb 19, blood glucose 165, BUN 20, creatinine 1.12, EGFR 57, anion gap 16, troponin x 2 was negative lipase 28. Chest x-ray showed no radiographic evidence of acute cardiopulmonary process CT abdomen and pelvis with contrast showed obstructing 10 x 16 x 19 mm calculus within the right ureteropelvic junction resulting in moderate right hydronephrosis and delayed right renal cortical enhancement. Superimposed mild bilateral nonobstructing nephrolithiasis. Urologist (Dr. Marcha Solders) was consulted and recommended admitting patient to [facility] and to consult urology service on arrival to [facility].  Hospitalist was asked to admit patient for further evaluation and management.   Review of Systems: Review of systems as noted in the HPI. All other systems reviewed and are negative.         Past Medical History:  Diagnosis Date   Adenomatous colon polyp 06/26/2011    05/17/11 Colonoscopy Dr Allayne Butcher adenoma Next colonoscopy 05/2016    DERMATOMYCOSIS 05/23/2010    Qualifier: Diagnosis of  By: Lodema Hong MD, Margaret     Diabetes mellitus type II 2007   GERD (gastroesophageal reflux disease) 04/03/2011    Non-critical Schatzki's ring, HH on EGD 05/17/11 Dr Jena Gauss    Hematuria     HYDRONEPHROSIS, LEFT 08/30/2009    Qualifier:  Diagnosis of  By: Garnette Czech PA, Dawn     HYPERLIPIDEMIA 12/01/2009    Qualifier: Diagnosis of  By: Garnette Czech PA, Dawn     Hypertension     Obesity (BMI 30.0-34.9) 07/15/2011   Renal lithiasis 6/11    Hospitalized a [facility] 3 days in June    Schatzki's ring     THYROID STIMULATING HORMONE, ABNORMAL 08/30/2009    Qualifier: Diagnosis of  By: Garnette Czech PA, Dawn     Tubular adenoma of colon 3/13         Past Surgical History:  Procedure Laterality Date   COLONOSCOPY N/A 01/29/2019    Procedure: COLONOSCOPY;  Surgeon: Corbin Ade, MD;  Location: [facility];  Service: Endoscopy;  Laterality: N/A;  2:00   COLONOSCOPY W/ POLYPECTOMY   05/17/11    Rourk-Tubular adenoma colon, benign SB bx   ESOPHAGEAL DILATION   05/17/2011    Rourk-Hiatal hernia/Incomplete noncritical Schatzki's ring/HH   POLYPECTOMY   01/29/2019    Procedure: POLYPECTOMY;  Surgeon: Corbin Ade, MD;  Location: [facility];  Service: Endoscopy;;   WISDOM TOOTH EXTRACTION          Social History:  reports that she has never smoked. She has never used smokeless tobacco. She reports that she does not drink alcohol and does not use drugs.          Allergies  Allergen Reactions   Codeine Nausea And Vomiting   Parlodel [Bromocriptine Mesylate]        headache   Penicillins Hives  Did it involve swelling of the face/tongue/throat, SOB, or low BP? No Did it involve sudden or severe rash/hives, skin peeling, or any reaction on the inside of your mouth or nose? Yes Did you need to seek medical attention at a hospital or doctor's office? Yes When did it last happen?      8 years ago If all above answers are "NO", may proceed with cephalosporin use.     Sulfa Antibiotics Hives, Nausea And Vomiting and Other (See Comments)   Sulfonamide Derivatives Hives           Family History  Problem Relation Age of Onset   Diabetes Mother     Hypertension Mother     Kidney failure Mother     Diabetes Father      Hypertension Father     Kidney failure Father     Diabetes Brother          x2   Crohn's disease Sister     Aneurysm Sister               Prior to Admission medications   Medication Sig Start Date End Date Taking? Authorizing Provider  amLODipine (NORVASC) 10 MG tablet Take 1 tablet by mouth once daily 10/06/21     Kerri Perches, MD  carvedilol (COREG) 3.125 MG tablet Take 1 tablet (3.125 mg total) by mouth 2 (two) times daily with a meal. 05/19/22     Kerri Perches, MD  glucose blood (ONETOUCH VERIO) test strip USE 1 STRIP TO CHECK GLUCOSE THREE TIMES DAILY 05/17/22     Kerri Perches, MD  insulin NPH-regular Human (70-30) 100 UNIT/ML injection Take 40  units in the morning and take 10 units in the evening 08/05/21     Kerri Perches, MD  nitrofurantoin, macrocrystal-monohydrate, (MACROBID) 100 MG capsule Take 1 capsule (100 mg total) by mouth 2 (two) times daily. 05/24/22     Kerri Perches, MD  OneTouch Delica Lancets 33G MISC Three times daily testing dx e11.65 03/24/20     Kerri Perches, MD  rosuvastatin (CRESTOR) 10 MG tablet Take 1 tablet (10 mg total) by mouth daily. 09/28/20     Kerri Perches, MD  spironolactone (ALDACTONE) 100 MG tablet Take 1 tablet (100 mg total) by mouth daily. 02/17/22     Kerri Perches, MD      Physical Exam: BP (!) 146/81   Pulse (!) 119   Temp (!) 102.3 F (39.1 C)   Resp (!) 24   Ht 5\' 4"  (1.626 m)   Wt 82.6 kg   SpO2 95%   BMI 31.24 kg/m    General: 62 y.o. year-old female illappearing, but in no acute distress.  Alert and oriented x3. HEENT: NCAT, EOMI Neck: Supple, trachea medial Cardiovascular: Regular rate and rhythm with no rubs or gallops.  No thyromegaly or JVD noted.  No lower extremity edema. 2/4 pulses in all 4 extremities. Respiratory: Clear to auscultation with no wheezes or rales. Good inspiratory effort. Abdomen: Soft, mild tenderness to palpation of CVA. Normal bowel sounds x4  quadrants. Muskuloskeletal: No cyanosis, clubbing or edema noted bilaterally Neuro: CN II-XII intact, strength 5/5 x 4, sensation, reflexes intact Skin: No ulcerative lesions noted or rashes Psychiatry: Judgement and insight appear normal. Mood is appropriate for condition and setting                Labs on Admission:  Basic Metabolic Panel: Last Labs  Recent Labs  Lab 07/06/22 1851  NA 135  K 3.3*  CL 100  CO2 19*  GLUCOSE 165*  BUN 20  CREATININE 1.12*  CALCIUM 9.2      Liver Function Tests: Last Labs  No results for input(s): "AST", "ALT", "ALKPHOS", "BILITOT", "PROT", "ALBUMIN" in the last 168 hours.   Last Labs     Recent Labs  Lab 07/06/22 1948  LIPASE 28      Last Labs  No results for input(s): "AMMONIA" in the last 168 hours.   CBC: Last Labs     Recent Labs  Lab 07/06/22 1851  WBC 18.2*  NEUTROABS 14.0*  HGB 13.2  HCT 43.3  MCV 101.4*  PLT 246      Cardiac Enzymes: Last Labs  No results for input(s): "CKTOTAL", "CKMB", "CKMBINDEX", "TROPONINI" in the last 168 hours.     BNP (last 3 results) Recent Labs (within last 365 days)  No results for input(s): "BNP" in the last 8760 hours.     ProBNP (last 3 results) Recent Labs (within last 365 days)  No results for input(s): "PROBNP" in the last 8760 hours.     CBG: Last Labs     Recent Labs  Lab 07/06/22 1830  GLUCAP 141*        Radiological Exams on Admission:  Imaging Results (Last 48 hours)  CT ABDOMEN PELVIS W CONTRAST   Result Date: 07/06/2022 CLINICAL DATA:  Abdominal pain, acute, nonlocalized, vomiting EXAM: CT ABDOMEN AND PELVIS WITH CONTRAST TECHNIQUE: Multidetector CT imaging of the abdomen and pelvis was performed using the standard protocol following bolus administration of intravenous contrast. RADIATION DOSE REDUCTION: This exam was performed according to the departmental dose-optimization program which includes automated exposure control, adjustment of the mA  and/or kV according to patient size and/or use of iterative reconstruction technique. CONTRAST:  OMNIPAQUE IOHEXOL 300 MG/ML  SOLN COMPARISON:  08/20/2013 FINDINGS: Lower chest: No acute abnormality.  Small hiatal hernia. Hepatobiliary: Mild hepatomegaly. No enhancing intrahepatic mass. No intra or extrahepatic biliary ductal dilation. Gallbladder unremarkable. Pancreas: Unremarkable Spleen: Normal in size without focal abnormality. Adrenals/Urinary Tract: The adrenal glands are unremarkable. The kidneys are normal in size and position. Moderate right hydronephrosis secondary to an obstructing 10 x 16 x 19 mm calculus within the right ureteropelvic junction. Moderate right perinephric stranding. Delayed right renal cortical enhancement. Superimposed bilateral nonobstructing nephrolithiasis is seen with calculi within the lower pole measuring up to 9 mm on the right and 10 mm on the left. No hydronephrosis on the left. No additional ureteral calculi. The bladder is unremarkable. Stomach/Bowel: Mild scattered colonic diverticulosis. The stomach, small bowel, and large bowel are otherwise unremarkable. Appendix normal. No evidence of obstruction or focal inflammation. No free intraperitoneal gas or fluid. Vascular/Lymphatic: Aortic atherosclerosis. No enlarged abdominal or pelvic lymph nodes. Reproductive: Uterus and bilateral adnexa are unremarkable. Other: No abdominal wall hernia or abnormality. No abdominopelvic ascites. Musculoskeletal: Degenerative changes are seen within the lumbar spine. No acute bone abnormality. No lytic or blastic bone lesion. IMPRESSION: 1. Obstructing 10 x 16 x 19 mm calculus within the right ureteropelvic junction resulting in moderate right hydronephrosis and delayed right renal cortical enhancement. Superimposed mild bilateral nonobstructing nephrolithiasis. Aortic Atherosclerosis (ICD10-I70.0). Electronically Signed   By: Helyn Numbers M.D.   On: 07/06/2022 22:08    DG Chest 1  View   Result Date: 07/06/2022 CLINICAL DATA:  Shortness of breath, nausea EXAM: CHEST  1 VIEW COMPARISON:  Chest radiograph 03/05/2018 FINDINGS:  The cardiomediastinal silhouette is normal There is no focal consolidation or pulmonary edema. There is no pleural effusion or pneumothorax There is no acute osseous abnormality. IMPRESSION: No radiographic evidence of acute cardiopulmonary process. Electronically Signed   By: Lesia Hausen M.D.   On: 07/06/2022 19:25       EKG: I independently viewed the EKG done and my findings are as followed:   normal sinus rhythm at a rate of 82 bpm with nonspecific intraventricular conduction block QTc 516 ms (similar to EKG done on 03/05/2018)     Assessment/Plan Present on Admission:  Acute pyelonephritis  Type 2 diabetes mellitus with hyperglycemia (HCC)  Essential hypertension  Hyperlipidemia LDL goal <100   Principal Problem:   Sepsis secondary to UTI [facility]) Active Problems:   Hyperlipidemia LDL goal <100   Essential hypertension   Acute pyelonephritis   Type 2 diabetes mellitus with hyperglycemia (HCC)   Nephrolithiasis   Hypokalemia   Prolonged QT interval   Elevated MCV   Sepsis secondary to acute pyelonephritis While in the ED, patient became febrile with a temperature of 102.80F, she was tachypneic and tachycardic.  WBC was elevated at 18.2 in the setting of obstructive calculus within the right ureteropelvic junction with subsequent moderate right hydronephrosis which appears to be infected at this time. Patient was already empirically treated with IV ciprofloxacin Urine culture done on 09/27/2020 was positive for E. coli which was sensitive to ciprofloxacin, we shall continue with IV ciprofloxacin at this time Continue Tylenol as needed Urinalysis, urine culture and blood culture pending   Nephrolithiasis CT abdomen and pelvis showed obstructive calculus within the right ureteropelvic junction resulting in moderate right hydronephrosis and  delayed right renal cortical enhancement.  Continue IV hydration Continue IV Dilaudid as needed Nephrologist (Dr. Marcha Solders) was already consulted and recommended admitting the patient to [facility] and to notify urology team on arrival to [facility].   Hypokalemia K+ 3.3, this will be replenished   Prolonged QT interval QTc Avoid QT prolonging drugs Potassium will be replenished Magnesium level will be checked Repeat EKG in the morning   T2DM with hyperglycemia Hemoglobin A1c at 6.5 on 05/19/2022 Continue ISS and hypoglycemic protocol   Elevated MCV MCV 101.4.  Vitamin B12 and folate levels will be checked   Essential hypertension Continue IV hydralazine 10 mg every 6 hours as needed for SBP > 170 Continue home meds when patient resumes oral intake   Mixed hyperlipidemia Consider starting patient's home Crestor when she resumes oral intake   DVT prophylaxis: SCDs    Advance Care Planning: Full code   Consults: Urology (by [facility] ED[facility])   Family Communication: Daughter at bedside (all questions answered to satisfaction)   Severity of Illness: The appropriate patient status for this patient is INPATIENT. Inpatient status is judged to be reasonable and necessary in order to provide the required intensity of service to ensure the patient's safety. The patient's presenting symptoms, physical exam findings, and initial radiographic and laboratory data in the context of their chronic comorbidities is felt to place them at high risk for further clinical deterioration. Furthermore, it is not anticipated that the patient will be medically stable for discharge from the hospital within 2 midnights of admission.    * I certify that at the point of admission it is my clinical judgment that the patient will require inpatient hospital care spanning beyond 2 midnights from the point of admission due to high intensity of service, high risk for further  deterioration and high frequency of  surveillance required.*

## 2022-07-28 ENCOUNTER — Other Ambulatory Visit: Payer: Self-pay

## 2022-07-28 ENCOUNTER — Encounter (HOSPITAL_COMMUNITY): Payer: Self-pay | Admitting: Urology

## 2022-07-28 ENCOUNTER — Inpatient Hospital Stay (HOSPITAL_COMMUNITY): Payer: BC Managed Care – PPO

## 2022-07-28 DIAGNOSIS — Y831 Surgical operation with implant of artificial internal device as the cause of abnormal reaction of the patient, or of later complication, without mention of misadventure at the time of the procedure: Secondary | ICD-10-CM | POA: Diagnosis present

## 2022-07-28 DIAGNOSIS — I10 Essential (primary) hypertension: Secondary | ICD-10-CM

## 2022-07-28 DIAGNOSIS — R112 Nausea with vomiting, unspecified: Secondary | ICD-10-CM | POA: Diagnosis not present

## 2022-07-28 DIAGNOSIS — Z6830 Body mass index (BMI) 30.0-30.9, adult: Secondary | ICD-10-CM | POA: Diagnosis not present

## 2022-07-28 DIAGNOSIS — Z841 Family history of disorders of kidney and ureter: Secondary | ICD-10-CM | POA: Diagnosis not present

## 2022-07-28 DIAGNOSIS — E876 Hypokalemia: Secondary | ICD-10-CM | POA: Diagnosis present

## 2022-07-28 DIAGNOSIS — Z1152 Encounter for screening for COVID-19: Secondary | ICD-10-CM | POA: Diagnosis not present

## 2022-07-28 DIAGNOSIS — N136 Pyonephrosis: Secondary | ICD-10-CM | POA: Diagnosis present

## 2022-07-28 DIAGNOSIS — R6520 Severe sepsis without septic shock: Secondary | ICD-10-CM | POA: Diagnosis not present

## 2022-07-28 DIAGNOSIS — T83122A Displacement of urinary stent, initial encounter: Secondary | ICD-10-CM | POA: Diagnosis not present

## 2022-07-28 DIAGNOSIS — E1159 Type 2 diabetes mellitus with other circulatory complications: Secondary | ICD-10-CM

## 2022-07-28 DIAGNOSIS — Z8249 Family history of ischemic heart disease and other diseases of the circulatory system: Secondary | ICD-10-CM | POA: Diagnosis not present

## 2022-07-28 DIAGNOSIS — Z86010 Personal history of colonic polyps: Secondary | ICD-10-CM | POA: Diagnosis not present

## 2022-07-28 DIAGNOSIS — E782 Mixed hyperlipidemia: Secondary | ICD-10-CM | POA: Diagnosis present

## 2022-07-28 DIAGNOSIS — B9620 Unspecified Escherichia coli [E. coli] as the cause of diseases classified elsewhere: Secondary | ICD-10-CM | POA: Diagnosis present

## 2022-07-28 DIAGNOSIS — N202 Calculus of kidney with calculus of ureter: Secondary | ICD-10-CM | POA: Diagnosis present

## 2022-07-28 DIAGNOSIS — Z882 Allergy status to sulfonamides status: Secondary | ICD-10-CM | POA: Diagnosis not present

## 2022-07-28 DIAGNOSIS — E11649 Type 2 diabetes mellitus with hypoglycemia without coma: Secondary | ICD-10-CM | POA: Diagnosis not present

## 2022-07-28 DIAGNOSIS — A419 Sepsis, unspecified organism: Secondary | ICD-10-CM | POA: Diagnosis not present

## 2022-07-28 DIAGNOSIS — T383X5A Adverse effect of insulin and oral hypoglycemic [antidiabetic] drugs, initial encounter: Secondary | ICD-10-CM | POA: Diagnosis not present

## 2022-07-28 DIAGNOSIS — Z880 Allergy status to penicillin: Secondary | ICD-10-CM | POA: Diagnosis not present

## 2022-07-28 DIAGNOSIS — K219 Gastro-esophageal reflux disease without esophagitis: Secondary | ICD-10-CM

## 2022-07-28 DIAGNOSIS — E785 Hyperlipidemia, unspecified: Secondary | ICD-10-CM

## 2022-07-28 DIAGNOSIS — Z833 Family history of diabetes mellitus: Secondary | ICD-10-CM | POA: Diagnosis not present

## 2022-07-28 DIAGNOSIS — R32 Unspecified urinary incontinence: Secondary | ICD-10-CM | POA: Diagnosis present

## 2022-07-28 DIAGNOSIS — N10 Acute tubulo-interstitial nephritis: Secondary | ICD-10-CM

## 2022-07-28 DIAGNOSIS — Z888 Allergy status to other drugs, medicaments and biological substances status: Secondary | ICD-10-CM | POA: Diagnosis not present

## 2022-07-28 DIAGNOSIS — E1165 Type 2 diabetes mellitus with hyperglycemia: Secondary | ICD-10-CM | POA: Diagnosis present

## 2022-07-28 DIAGNOSIS — Z885 Allergy status to narcotic agent status: Secondary | ICD-10-CM | POA: Diagnosis not present

## 2022-07-28 DIAGNOSIS — E669 Obesity, unspecified: Secondary | ICD-10-CM | POA: Diagnosis present

## 2022-07-28 LAB — URINALYSIS, W/ REFLEX TO CULTURE (INFECTION SUSPECTED)
Bilirubin Urine: NEGATIVE
Glucose, UA: >=500 mg/dL — AB
Ketones, ur: 20 mg/dL — AB
Nitrite: POSITIVE — AB
Protein, ur: 100 mg/dL — AB
RBC / HPF: >50 RBC/hpf (ref 0–5)
Renal Epithelial: <1
Specific Gravity, Urine: 1.023 (ref 1.005–1.030)
WBC, UA: >50 WBC/hpf (ref 0–5)
pH: 6 (ref 5.0–8.0)

## 2022-07-28 LAB — CBC WITH DIFFERENTIAL/PLATELET
Abs Immature Granulocytes: 0.04 10*3/uL (ref 0.00–0.07)
Basophils Absolute: 0 10*3/uL (ref 0.0–0.1)
Basophils Relative: 0 %
Eosinophils Absolute: 0 10*3/uL (ref 0.0–0.5)
Eosinophils Relative: 0 %
HCT: 38.1 % (ref 36.0–46.0)
Hemoglobin: 12.5 g/dL (ref 12.0–15.0)
Immature Granulocytes: 0 %
Lymphocytes Relative: 15 %
Lymphs Abs: 1.6 10*3/uL (ref 0.7–4.0)
MCH: 30.7 pg (ref 26.0–34.0)
MCHC: 32.8 g/dL (ref 30.0–36.0)
MCV: 93.6 fL (ref 80.0–100.0)
Monocytes Absolute: 0.4 10*3/uL (ref 0.1–1.0)
Monocytes Relative: 4 %
Neutro Abs: 8.2 10*3/uL — ABNORMAL HIGH (ref 1.7–7.7)
Neutrophils Relative %: 81 %
Platelets: 202 10*3/uL (ref 150–400)
RBC: 4.07 MIL/uL (ref 3.87–5.11)
RDW: 11.9 % (ref 11.5–15.5)
WBC: 10.3 10*3/uL (ref 4.0–10.5)
nRBC: 0 % (ref 0.0–0.2)

## 2022-07-28 LAB — COMPREHENSIVE METABOLIC PANEL
ALT: 15 U/L (ref 0–44)
AST: 15 U/L (ref 15–41)
Albumin: 3.5 g/dL (ref 3.5–5.0)
Alkaline Phosphatase: 75 U/L (ref 38–126)
Anion gap: 12 (ref 5–15)
BUN: 14 mg/dL (ref 6–20)
CO2: 23 mmol/L (ref 22–32)
Calcium: 9.3 mg/dL (ref 8.9–10.3)
Chloride: 97 mmol/L — ABNORMAL LOW (ref 98–111)
Creatinine, Ser: 0.88 mg/dL (ref 0.44–1.00)
GFR, Estimated: >60 mL/min (ref >60)
Glucose, Bld: 290 mg/dL — ABNORMAL HIGH (ref 70–99)
Potassium: 3.7 mmol/L (ref 3.5–5.1)
Sodium: 132 mmol/L — ABNORMAL LOW (ref 135–145)
Total Bilirubin: 0.7 mg/dL (ref 0.3–1.2)
Total Protein: 8.6 g/dL — ABNORMAL HIGH (ref 6.5–8.1)

## 2022-07-28 LAB — GLUCOSE, CAPILLARY
Glucose-Capillary: 115 mg/dL — ABNORMAL HIGH (ref 70–99)
Glucose-Capillary: 137 mg/dL — ABNORMAL HIGH (ref 70–99)
Glucose-Capillary: 154 mg/dL — ABNORMAL HIGH (ref 70–99)
Glucose-Capillary: 288 mg/dL — ABNORMAL HIGH (ref 70–99)
Glucose-Capillary: 298 mg/dL — ABNORMAL HIGH (ref 70–99)
Glucose-Capillary: 36 mg/dL — CL (ref 70–99)
Glucose-Capillary: 50 mg/dL — ABNORMAL LOW (ref 70–99)
Glucose-Capillary: 89 mg/dL (ref 70–99)

## 2022-07-28 LAB — LACTIC ACID, PLASMA
Lactic Acid, Venous: 1.8 mmol/L (ref 0.5–1.9)
Lactic Acid, Venous: 2.9 mmol/L (ref 0.5–1.9)

## 2022-07-28 LAB — CORTISOL-AM, BLOOD: Cortisol - AM: 42.5 ug/dL — ABNORMAL HIGH (ref 6.7–22.6)

## 2022-07-28 LAB — PROCALCITONIN: Procalcitonin: <0.1 ng/mL

## 2022-07-28 LAB — PROTIME-INR
INR: 1 (ref 0.8–1.2)
Prothrombin Time: 13.5 seconds (ref 11.4–15.2)

## 2022-07-28 LAB — MAGNESIUM: Magnesium: 1.3 mg/dL — ABNORMAL LOW (ref 1.7–2.4)

## 2022-07-28 MED ORDER — ONDANSETRON HCL 4 MG/2ML IJ SOLN
4.0000 mg | Freq: Four times a day (QID) | INTRAMUSCULAR | Status: DC | PRN
  Administered 2022-07-28: 4 mg via INTRAVENOUS
  Filled 2022-07-28 (×2): qty 2

## 2022-07-28 MED ORDER — HYDRALAZINE HCL 20 MG/ML IJ SOLN: 10.0000 mg | INTRAMUSCULAR | Status: DC | PRN

## 2022-07-28 MED ORDER — CARVEDILOL 3.125 MG PO TABS
6.2500 mg | ORAL_TABLET | Freq: Two times a day (BID) | ORAL | Status: DC
  Administered 2022-07-28 – 2022-07-31 (×7): 6.25 mg via ORAL
  Filled 2022-07-28 (×7): qty 2

## 2022-07-28 MED ORDER — HEPARIN SODIUM (PORCINE) 5000 UNIT/ML IJ SOLN
5000.0000 [IU] | Freq: Three times a day (TID) | INTRAMUSCULAR | Status: DC
  Administered 2022-07-28 – 2022-07-31 (×10): 5000 [IU] via SUBCUTANEOUS
  Filled 2022-07-28 (×10): qty 1

## 2022-07-28 MED ORDER — ACETAMINOPHEN 650 MG RE SUPP: 650.0000 mg | Freq: Four times a day (QID) | RECTAL | Status: DC | PRN

## 2022-07-28 MED ORDER — AMLODIPINE BESYLATE 5 MG PO TABS
10.0000 mg | ORAL_TABLET | Freq: Every day | ORAL | Status: DC
  Administered 2022-07-28 – 2022-07-31 (×4): 10 mg via ORAL
  Filled 2022-07-28 (×4): qty 2

## 2022-07-28 MED ORDER — INSULIN ASPART 100 UNIT/ML IJ SOLN: 0.0000 [IU] | Freq: Every day | INTRAMUSCULAR | Status: DC

## 2022-07-28 MED ORDER — SODIUM CHLORIDE 0.9 % IV SOLN
2.0000 g | Freq: Every day | INTRAVENOUS | Status: DC
  Administered 2022-07-29 – 2022-07-31 (×3): 2 g via INTRAVENOUS
  Filled 2022-07-28 (×3): qty 20

## 2022-07-28 MED ORDER — PROCHLORPERAZINE EDISYLATE 10 MG/2ML IJ SOLN
10.0000 mg | INTRAMUSCULAR | Status: DC | PRN
  Administered 2022-07-28: 10 mg via INTRAVENOUS
  Filled 2022-07-28: qty 2

## 2022-07-28 MED ORDER — DEXTROSE 50 % IV SOLN
INTRAVENOUS | Status: AC
  Administered 2022-07-29: 50 mL
  Filled 2022-07-28: qty 50

## 2022-07-28 MED ORDER — ROSUVASTATIN CALCIUM 20 MG PO TABS
10.0000 mg | ORAL_TABLET | Freq: Every day | ORAL | Status: DC
  Administered 2022-07-28 – 2022-07-31 (×4): 10 mg via ORAL
  Filled 2022-07-28 (×4): qty 1

## 2022-07-28 MED ORDER — MAGNESIUM SULFATE 4 GM/100ML IV SOLN
4.0000 g | Freq: Once | INTRAVENOUS | Status: AC
  Administered 2022-07-28: 4 g via INTRAVENOUS
  Filled 2022-07-28: qty 100

## 2022-07-28 MED ORDER — LABETALOL HCL 5 MG/ML IV SOLN
10.0000 mg | INTRAVENOUS | Status: DC | PRN
  Administered 2022-07-28: 10 mg via INTRAVENOUS
  Filled 2022-07-28: qty 4

## 2022-07-28 MED ORDER — INSULIN ASPART 100 UNIT/ML IJ SOLN
0.0000 [IU] | Freq: Four times a day (QID) | INTRAMUSCULAR | Status: DC
  Administered 2022-07-28: 1 [IU] via SUBCUTANEOUS
  Administered 2022-07-29: 2 [IU] via SUBCUTANEOUS
  Administered 2022-07-30: 3 [IU] via SUBCUTANEOUS
  Administered 2022-07-30: 1 [IU] via SUBCUTANEOUS

## 2022-07-28 MED ORDER — ONDANSETRON HCL 4 MG/2ML IJ SOLN
4.0000 mg | Freq: Once | INTRAMUSCULAR | Status: AC
  Administered 2022-07-28: 4 mg via INTRAVENOUS

## 2022-07-28 MED ORDER — ONDANSETRON HCL 4 MG/2ML IJ SOLN
4.0000 mg | Freq: Once | INTRAMUSCULAR | Status: AC
  Administered 2022-07-28: 4 mg via INTRAVENOUS
  Filled 2022-07-28: qty 2

## 2022-07-28 MED ORDER — PANTOPRAZOLE SODIUM 40 MG IV SOLR
40.0000 mg | Freq: Two times a day (BID) | INTRAVENOUS | Status: DC
  Administered 2022-07-28 – 2022-07-31 (×7): 40 mg via INTRAVENOUS
  Filled 2022-07-28 (×7): qty 10

## 2022-07-28 MED ORDER — INSULIN ASPART PROT & ASPART (70-30 MIX) 100 UNIT/ML ~~LOC~~ SUSP
25.0000 [IU] | Freq: Two times a day (BID) | SUBCUTANEOUS | Status: DC
  Filled 2022-07-28: qty 10

## 2022-07-28 MED ORDER — INSULIN ASPART 100 UNIT/ML IJ SOLN: 0.0000 [IU] | INTRAMUSCULAR | Status: DC

## 2022-07-28 MED ORDER — INSULIN GLARGINE-YFGN 100 UNIT/ML ~~LOC~~ SOLN
40.0000 [IU] | Freq: Every day | SUBCUTANEOUS | Status: DC
  Filled 2022-07-28 (×2): qty 0.4

## 2022-07-28 MED ORDER — INSULIN GLARGINE-YFGN 100 UNIT/ML ~~LOC~~ SOLN
12.0000 [IU] | Freq: Every day | SUBCUTANEOUS | Status: DC
  Administered 2022-07-28: 12 [IU] via SUBCUTANEOUS
  Filled 2022-07-28 (×3): qty 0.12

## 2022-07-28 MED ORDER — ACETAMINOPHEN 325 MG PO TABS
650.0000 mg | ORAL_TABLET | Freq: Four times a day (QID) | ORAL | Status: DC | PRN
  Administered 2022-07-30: 650 mg via ORAL
  Filled 2022-07-28: qty 2

## 2022-07-28 MED ORDER — PANTOPRAZOLE SODIUM 40 MG PO TBEC
40.0000 mg | DELAYED_RELEASE_TABLET | Freq: Every day | ORAL | Status: DC
  Administered 2022-07-28: 40 mg via ORAL
  Filled 2022-07-28: qty 1

## 2022-07-28 MED ORDER — METOPROLOL TARTRATE 5 MG/5ML IV SOLN
5.0000 mg | Freq: Once | INTRAVENOUS | Status: AC
  Administered 2022-07-28: 5 mg via INTRAVENOUS
  Filled 2022-07-28: qty 5

## 2022-07-28 MED ORDER — CHLORHEXIDINE GLUCONATE CLOTH 2 % EX PADS
6.0000 | MEDICATED_PAD | Freq: Every day | CUTANEOUS | Status: DC
  Administered 2022-07-28 – 2022-07-31 (×4): 6 via TOPICAL

## 2022-07-28 MED ORDER — HYDROMORPHONE HCL 1 MG/ML IJ SOLN
0.5000 mg | INTRAMUSCULAR | Status: DC | PRN
  Administered 2022-07-28 – 2022-07-29 (×5): 0.5 mg via INTRAVENOUS
  Filled 2022-07-28 (×5): qty 0.5

## 2022-07-28 MED ORDER — ONDANSETRON HCL 4 MG PO TABS: 4.0000 mg | ORAL_TABLET | Freq: Four times a day (QID) | ORAL | Status: DC | PRN

## 2022-07-28 MED ORDER — INSULIN ASPART 100 UNIT/ML IJ SOLN
0.0000 [IU] | Freq: Three times a day (TID) | INTRAMUSCULAR | Status: DC
  Administered 2022-07-28: 11 [IU] via SUBCUTANEOUS

## 2022-07-28 MED ORDER — OXYCODONE HCL 5 MG PO TABS
5.0000 mg | ORAL_TABLET | ORAL | Status: DC | PRN
  Administered 2022-07-28 – 2022-07-30 (×4): 5 mg via ORAL
  Filled 2022-07-28 (×5): qty 1

## 2022-07-28 MED ORDER — LACTATED RINGERS IV SOLN: INTRAVENOUS | Status: DC

## 2022-07-28 NOTE — Consult Note (Signed)
Urology Consult  Referring physician: Dr. Laural Benes Reason for referral: sepsis after ureteroscopy  Chief Complaint: abdominal pain  History of Present Illness: Caitlin Thompson is a 61yo with a history of nephrolithiasis who underwent rigth ureteroscopic stone extraction and right nephrostomy tube placement yesterday with Dr. Marlou Porch. Once she returned home she became febrile and had nausea/vomiting. She presented to the ER at [facility] and is currently being treated for sepsis from a urinary source. CT on admission shows a fragmented right renal calculus and the right ureteral stent in the urethra. Patient was noted to have the right ureteral stent exposed in her vagina this morning. She is currently having incontinence and has a purewick in place. She has mild abdominal pain which has improved since admission.   Past Medical History:  Diagnosis Date   Adenomatous colon polyp 06/26/2011   05/17/11 Colonoscopy Dr Allayne Butcher adenoma Next colonoscopy 05/2016    Complication of anesthesia    DERMATOMYCOSIS 05/23/2010   Qualifier: Diagnosis of  By: Lodema Hong MD, Margaret     Diabetes mellitus type II 03/06/2005   GERD (gastroesophageal reflux disease) 04/03/2011   Non-critical Schatzki's ring, HH on EGD 05/17/11 Dr Jena Gauss    Heart murmur    Hematuria    History of kidney stones    HYDRONEPHROSIS, LEFT 08/30/2009   Qualifier: Diagnosis of  By: Garnette Czech PA, Dawn     HYPERLIPIDEMIA 12/01/2009   Qualifier: Diagnosis of  By: Garnette Czech PA, Dawn     Hypertension    Obesity (BMI 30.0-34.9) 07/15/2011   Renal lithiasis 08/04/2009   Hospitalized a [facility] 3 days in June    Schatzki's ring    THYROID STIMULATING HORMONE, ABNORMAL 08/30/2009   Qualifier: Diagnosis of  By: Garnette Czech PA, Dawn     Tubular adenoma of colon 05/05/2011   Past Surgical History:  Procedure Laterality Date   COLONOSCOPY N/A 01/29/2019   Procedure: COLONOSCOPY;  Surgeon: Corbin Ade, MD;  Location: [facility];  Service:  Endoscopy;  Laterality: N/A;  2:00   COLONOSCOPY W/ POLYPECTOMY  05/17/11   Rourk-Tubular adenoma colon, benign SB bx   CYSTOSCOPY/URETEROSCOPY/HOLMIUM LASER/STENT PLACEMENT Right 07/27/2022   Procedure: CYSTOSCOPY, RIGHT URETEROSCOPY, HOLMIUM LASER LITHOTRIPSY, RIGHT URETERAL STENT PLACEMENT, AND REMOVAL OF RIGHT NEPHROSTOMY TUBE;  Surgeon: Crist Fat, MD;  Location: [facility];  Service: Urology;  Laterality: Right;  90 MINUTES   ESOPHAGEAL DILATION  05/17/2011   Rourk-Hiatal hernia/Incomplete noncritical Schatzki's ring/HH   IR NEPHROSTOMY PLACEMENT RIGHT  07/07/2022   POLYPECTOMY  01/29/2019   Procedure: POLYPECTOMY;  Surgeon: Corbin Ade, MD;  Location: [facility];  Service: Endoscopy;;   WISDOM TOOTH EXTRACTION      Medications: I have reviewed the patient's current medications. Allergies:  Allergies  Allergen Reactions   Codeine Nausea And Vomiting   Fluconazole Other (See Comments)    Prolonged QT   Parlodel [Bromocriptine Mesylate]     headache   Penicillins Hives   Sulfa Antibiotics Hives, Nausea And Vomiting and Other (See Comments)   Sulfonamide Derivatives Hives    Family History  Problem Relation Age of Onset   Diabetes Mother    Hypertension Mother    Kidney failure Mother    Diabetes Father    Hypertension Father    Kidney failure Father    Diabetes Brother        x2   Crohn's disease Sister    Aneurysm Sister    Social History:  reports that she has never  smoked. She has never used smokeless tobacco. She reports that she does not drink alcohol and does not use drugs.  Review of Systems  Constitutional:  Positive for chills and fever.  Genitourinary:  Positive for flank pain.  All other systems reviewed and are negative.   Physical Exam:  Vital signs in last 24 hours: Temp:  [97.5 F (36.4 C)-100.5 F (38.1 C)] 98.6 F (37 C) (05/24 1800) Pulse Rate:  [85-116] 87 (05/24 1800) Resp:  [10-29] 18 (05/24 1800) BP: (124-193)/(71-100) 160/79  (05/24 1800) SpO2:  [85 %-100 %] 99 % (05/24 1800) Weight:  [83.9 kg] 83.9 kg (05/23 2150) Physical Exam Vitals reviewed.  Constitutional:      Appearance: Normal appearance.  HENT:     Head: Normocephalic and atraumatic.     Nose: Nose normal. No congestion.     Mouth/Throat:     Mouth: Mucous membranes are dry.  Eyes:     Extraocular Movements: Extraocular movements intact.     Pupils: Pupils are equal, round, and reactive to light.  Cardiovascular:     Rate and Rhythm: Tachycardia present.  Pulmonary:     Effort: Pulmonary effort is normal. No respiratory distress.  Abdominal:     General: Abdomen is flat. There is no distension.  Musculoskeletal:        General: Normal range of motion.     Cervical back: Normal range of motion and neck supple.  Skin:    General: Skin is warm and dry.  Neurological:     General: No focal deficit present.     Mental Status: She is alert and oriented to person, place, and time.  Psychiatric:        Mood and Affect: Mood normal.        Behavior: Behavior normal.     Laboratory Data:  Results for orders placed or performed during the hospital encounter of 07/27/22 (from the past 72 hour(s))  Lipase, blood     Status: None   Collection Time: 07/27/22 10:22 PM  Result Value Ref Range   Lipase 22 11 - 51 U/L    Comment: Performed at [facility], [address], [place], [place] [address]  Comprehensive metabolic panel     Status: Abnormal   Collection Time: 07/27/22 10:22 PM  Result Value Ref Range   Sodium 133 (L) 135 - 145 mmol/L   Potassium 4.6 3.5 - 5.1 mmol/L   Chloride 98 98 - 111 mmol/L   CO2 20 (L) 22 - 32 mmol/L   Glucose, Bld 305 (H) 70 - 99 mg/dL    Comment: Glucose reference range applies only to samples taken after fasting for at least 8 hours.   BUN 18 6 - 20 mg/dL   Creatinine, Ser 0.86 (H) 0.44 - 1.00 mg/dL   Calcium 9.5 8.9 - 57.8 mg/dL   Total Protein 8.6 (H) 6.5 - 8.1 g/dL   Albumin 3.7 3.5 - 5.0 g/dL   AST 26 15  - 41 U/L   ALT 19 0 - 44 U/L   Alkaline Phosphatase 74 38 - 126 U/L   Total Bilirubin 0.9 0.3 - 1.2 mg/dL   GFR, Estimated 54 (L) >60 mL/min    Comment: (NOTE) Calculated using the CKD-EPI Creatinine Equation (2021)    Anion gap 15 5 - 15    Comment: Performed at [facility], [address] [place], [place] [address]  CBC     Status: None   Collection Time: 07/27/22 10:22 PM  Result Value Ref Range   WBC 10.5 4.0 - 10.5 K/uL   RBC 4.31 3.87 - 5.11 MIL/uL   Hemoglobin 13.3 12.0 - 15.0 g/dL   HCT 16.1 09.6 - 04.5 %   MCV 92.8 80.0 - 100.0 fL   MCH 30.9 26.0 - 34.0 pg   MCHC 33.3 30.0 - 36.0 g/dL   RDW 40.9 81.1 - 91.4 %   Platelets 312 150 - 400 K/uL   nRBC 0.0 0.0 - 0.2 %    Comment: Performed at [facility], [address], [place], [place] [address]  Lactic acid, plasma     Status: Abnormal   Collection Time: 07/27/22 10:22 PM  Result Value Ref Range   Lactic Acid, Venous 2.5 (HH) 0.5 - 1.9 mmol/L    Comment: CRITICAL RESULT CALLED TO, READ BACK BY AND VERIFIED WITH M.MOSTELLER AT 2314 ON 05.23.24 BY ADGER J Performed at [facility], [address] [place], [place] [address]   Protime-INR     Status: None   Collection Time: 07/27/22 10:22 PM  Result Value Ref Range   Prothrombin Time 13.6 11.4 - 15.2 seconds   INR 1.0 0.8 - 1.2    Comment: (NOTE) INR goal varies based on device and disease states. Performed at [facility], [address], [place], [place] [address]   APTT     Status: None   Collection Time: 07/27/22 10:22 PM  Result Value Ref Range   aPTT 28 24 - 36 seconds    Comment: Performed at [facility], [address], [place], [place] [address]  Blood Culture (routine x 2)     Status: None (Preliminary result)   Collection Time: 07/27/22 10:22 PM   Specimen: BLOOD  Result Value Ref Range   Specimen Description BLOOD BLOOD RIGHT HAND    Special Requests      BOTTLES DRAWN AEROBIC AND ANAEROBIC Blood Culture results may not be optimal due to an inadequate  volume of blood received in culture bottles   Culture      NO GROWTH < 12 HOURS Performed at [facility], [address] [place], [place] [address]    Report Status PENDING   Blood Culture (routine x 2)     Status: None (Preliminary result)   Collection Time: 07/27/22 10:22 PM   Specimen: BLOOD  Result Value Ref Range   Specimen Description BLOOD BLOOD LEFT HAND    Special Requests      BOTTLES DRAWN AEROBIC AND ANAEROBIC Blood Culture adequate volume   Culture      NO GROWTH < 12 HOURS Performed at [facility], [address], [place], [place] [address]    Report Status PENDING   Resp panel by RT-PCR (RSV, Flu A&B, Covid) Anterior Nasal Swab     Status: None   Collection Time: 07/27/22 10:30 PM   Specimen: Anterior Nasal Swab  Result Value Ref Range   SARS Coronavirus 2 by RT PCR NEGATIVE NEGATIVE    Comment: (NOTE) SARS-CoV-2 target nucleic acids are NOT DETECTED.  The SARS-CoV-2 RNA is generally detectable in upper respiratory specimens during the acute phase of infection. The lowest concentration of SARS-CoV-2 viral copies this assay can detect is 138 copies/mL. A negative result does not preclude SARS-Cov-2 infection and should not be used as the sole basis for treatment or other patient management decisions. A negative result may occur with  improper specimen collection/handling, submission of specimen other than nasopharyngeal swab, presence of viral mutation(s) within the areas targeted  by this assay, and inadequate number of viral copies(<138 copies/mL). A negative result must be combined with clinical observations, patient history, and epidemiological information. The expected result is Negative.  Fact Sheet for Patients:  BloggerCourse.com  Fact Sheet for Healthcare Providers:  SeriousBroker.it  This test is no t yet approved or cleared by the [place] FDA and  has been authorized for detection and/or  diagnosis of SARS-CoV-2 by FDA under an Emergency Use Authorization (EUA). This EUA will remain  in effect (meaning this test can be used) for the duration of the COVID-19 declaration under Section 564(b)(1) of the Act, 21 U.S.C.section 360bbb-3(b)(1), unless the authorization is terminated  or revoked sooner.       Influenza A by PCR NEGATIVE NEGATIVE   Influenza B by PCR NEGATIVE NEGATIVE    Comment: (NOTE) The Xpert Xpress SARS-CoV-2/FLU/RSV plus assay is intended as an aid in the diagnosis of influenza from Nasopharyngeal swab specimens and should not be used as a sole basis for treatment. Nasal washings and aspirates are unacceptable for Xpert Xpress SARS-CoV-2/FLU/RSV testing.  Fact Sheet for Patients: BloggerCourse.com  Fact Sheet for Healthcare Providers: SeriousBroker.it  This test is not yet approved or cleared by the [place] FDA and has been authorized for detection and/or diagnosis of SARS-CoV-2 by FDA under an Emergency Use Authorization (EUA). This EUA will remain in effect (meaning this test can be used) for the duration of the COVID-19 declaration under Section 564(b)(1) of the Act, 21 U.S.C. section 360bbb-3(b)(1), unless the authorization is terminated or revoked.     Resp Syncytial Virus by PCR NEGATIVE NEGATIVE    Comment: (NOTE) Fact Sheet for Patients: BloggerCourse.com  Fact Sheet for Healthcare Providers: SeriousBroker.it  This test is not yet approved or cleared by the [place] FDA and has been authorized for detection and/or diagnosis of SARS-CoV-2 by FDA under an Emergency Use Authorization (EUA). This EUA will remain in effect (meaning this test can be used) for the duration of the COVID-19 declaration under Section 564(b)(1) of the Act, 21 U.S.C. section 360bbb-3(b)(1), unless the authorization is terminated or revoked.  Performed  at [facility], [address] [place], [place] [address]   Urinalysis, w/ Reflex to Culture (Infection Suspected) -Urine, Clean Catch     Status: Abnormal   Collection Time: 07/28/22 12:05 AM  Result Value Ref Range   Specimen Source URINE, CLEAN CATCH    Color, Urine AMBER (A) YELLOW    Comment: BIOCHEMICALS MAY BE AFFECTED BY COLOR   APPearance CLEAR CLEAR   Specific Gravity, Urine 1.023 1.005 - 1.030   pH 6.0 5.0 - 8.0   Glucose, UA >=500 (A) NEGATIVE mg/dL   Hgb urine dipstick LARGE (A) NEGATIVE   Bilirubin Urine NEGATIVE NEGATIVE   Ketones, ur 20 (A) NEGATIVE mg/dL   Protein, ur 604 (A) NEGATIVE mg/dL   Nitrite POSITIVE (A) NEGATIVE   Leukocytes,Ua LARGE (A) NEGATIVE   RBC / HPF >50 0 - 5 RBC/hpf   WBC, UA >50 0 - 5 WBC/hpf    Comment:        Reflex urine culture not performed if WBC <=10, OR if Squamous epithelial cells >5. If Squamous epithelial cells >5 suggest recollection.    Bacteria, UA FEW (A) NONE SEEN   Squamous Epithelial / HPF 0-5 0 - 5 /HPF   Renal Epithelial <1    WBC Clumps PRESENT    Budding Yeast PRESENT     Comment: Performed at [facility], [address], [place], [place] [address]  Lactic acid, plasma     Status: Abnormal   Collection Time: 07/28/22 12:06 AM  Result Value Ref Range   Lactic Acid, Venous 2.9 (HH) 0.5 - 1.9 mmol/L    Comment: CRITICAL RESULT CALLED TO, READ BACK BY AND VERIFIED WITH C.EANES AT 0031 ON 05.24.24 BY ADGER J Performed at [facility], [address], [place], [place] [address]   Protime-INR     Status: None   Collection Time: 07/28/22  4:35 AM  Result Value Ref Range   Prothrombin Time 13.5 11.4 - 15.2 seconds   INR 1.0 0.8 - 1.2    Comment: (NOTE) INR goal varies based on device and disease states. Performed at [facility], [address] [place], [place] [address]   Cortisol-am, blood     Status: Abnormal   Collection Time: 07/28/22  4:35 AM  Result Value Ref Range   Cortisol - AM 42.5 (H) 6.7 - 22.6 ug/dL     Comment: Performed at [facility], [address] [place], [place] [address]  Procalcitonin     Status: None   Collection Time: 07/28/22  4:35 AM  Result Value Ref Range   Procalcitonin <0.10 ng/mL    Comment:        Interpretation: PCT (Procalcitonin) <= 0.5 ng/mL: Systemic infection (sepsis) is not likely. Local bacterial infection is possible. (NOTE)       Sepsis PCT Algorithm           Lower Respiratory Tract                                      Infection PCT Algorithm    ----------------------------     ----------------------------         PCT < 0.25 ng/mL                PCT < 0.10 ng/mL          Strongly encourage             Strongly discourage   discontinuation of antibiotics    initiation of antibiotics    ----------------------------     -----------------------------       PCT 0.25 - 0.50 ng/mL            PCT 0.10 - 0.25 ng/mL               OR       >80% decrease in PCT            Discourage initiation of                                            antibiotics      Encourage discontinuation           of antibiotics    ----------------------------     -----------------------------         PCT >= 0.50 ng/mL              PCT 0.26 - 0.50 ng/mL               AND        <80% decrease in PCT             Encourage initiation of  antibiotics       Encourage continuation           of antibiotics    ----------------------------     -----------------------------        PCT >= 0.50 ng/mL                  PCT > 0.50 ng/mL               AND         increase in PCT                  Strongly encourage                                      initiation of antibiotics    Strongly encourage escalation           of antibiotics                                     -----------------------------                                           PCT <= 0.25 ng/mL                                                 OR                                         > 80% decrease in PCT                                      Discontinue / Do not initiate                                             antibiotics  Performed at [facility], [address] [place], [place] [address]   Comprehensive metabolic panel     Status: Abnormal   Collection Time: 07/28/22  4:35 AM  Result Value Ref Range   Sodium 132 (L) 135 - 145 mmol/L   Potassium 3.7 3.5 - 5.1 mmol/L   Chloride 97 (L) 98 - 111 mmol/L   CO2 23 22 - 32 mmol/L   Glucose, Bld 290 (H) 70 - 99 mg/dL    Comment: Glucose reference range applies only to samples taken after fasting for at least 8 hours.   BUN 14 6 - 20 mg/dL   Creatinine, Ser 0.93 0.44 - 1.00 mg/dL   Calcium 9.3 8.9 - 81.8 mg/dL   Total Protein 8.6 (H) 6.5 - 8.1 g/dL   Albumin 3.5 3.5 - 5.0 g/dL   AST 15 15 - 41 U/L   ALT 15 0 - 44 U/L   Alkaline Phosphatase 75 38 - 126 U/L   Total Bilirubin  0.7 0.3 - 1.2 mg/dL   GFR, Estimated >54 >09 mL/min    Comment: (NOTE) Calculated using the CKD-EPI Creatinine Equation (2021)    Anion gap 12 5 - 15    Comment: Performed at [facility], [address], [place], [place] [address]  Magnesium     Status: Abnormal   Collection Time: 07/28/22  4:35 AM  Result Value Ref Range   Magnesium 1.3 (L) 1.7 - 2.4 mg/dL    Comment: Performed at [facility], [address], [place], [place] [address]  CBC with Differential/Platelet     Status: Abnormal   Collection Time: 07/28/22  4:35 AM  Result Value Ref Range   WBC 10.3 4.0 - 10.5 K/uL   RBC 4.07 3.87 - 5.11 MIL/uL   Hemoglobin 12.5 12.0 - 15.0 g/dL   HCT 56.2 13.0 - 86.5 %   MCV 93.6 80.0 - 100.0 fL   MCH 30.7 26.0 - 34.0 pg   MCHC 32.8 30.0 - 36.0 g/dL   RDW 78.4 69.6 - 29.5 %   Platelets 202 150 - 400 K/uL   nRBC 0.0 0.0 - 0.2 %   Neutrophils Relative % 81 %   Neutro Abs 8.2 (H) 1.7 - 7.7 K/uL   Lymphocytes Relative 15 %   Lymphs Abs 1.6 0.7 - 4.0 K/uL   Monocytes Relative 4 %   Monocytes Absolute 0.4 0.1 - 1.0 K/uL   Eosinophils  Relative 0 %   Eosinophils Absolute 0.0 0.0 - 0.5 K/uL   Basophils Relative 0 %   Basophils Absolute 0.0 0.0 - 0.1 K/uL   Immature Granulocytes 0 %   Abs Immature Granulocytes 0.04 0.00 - 0.07 K/uL    Comment: Performed at [facility], [address] [place], [place] [address]  Glucose, capillary     Status: Abnormal   Collection Time: 07/28/22  5:32 AM  Result Value Ref Range   Glucose-Capillary 288 (H) 70 - 99 mg/dL    Comment: Glucose reference range applies only to samples taken after fasting for at least 8 hours.  Lactic acid, plasma     Status: None   Collection Time: 07/28/22  5:47 AM  Result Value Ref Range   Lactic Acid, Venous 1.8 0.5 - 1.9 mmol/L    Comment: Performed at [facility], [address], [place], [place] [address]  Glucose, capillary     Status: Abnormal   Collection Time: 07/28/22  7:22 AM  Result Value Ref Range   Glucose-Capillary 298 (H) 70 - 99 mg/dL    Comment: Glucose reference range applies only to samples taken after fasting for at least 8 hours.  Glucose, capillary     Status: Abnormal   Collection Time: 07/28/22 10:47 AM  Result Value Ref Range   Glucose-Capillary 154 (H) 70 - 99 mg/dL    Comment: Glucose reference range applies only to samples taken after fasting for at least 8 hours.  Glucose, capillary     Status: Abnormal   Collection Time: 07/28/22 11:49 AM  Result Value Ref Range   Glucose-Capillary 137 (H) 70 - 99 mg/dL    Comment: Glucose reference range applies only to samples taken after fasting for at least 8 hours.  Glucose, capillary     Status: Abnormal   Collection Time: 07/28/22  4:16 PM  Result Value Ref Range   Glucose-Capillary 50 (L) 70 - 99 mg/dL    Comment: Glucose reference range applies only to samples taken after fasting for at least 8  hours.  Glucose, capillary     Status: None   Collection Time: 07/28/22  4:45 PM  Result Value Ref Range   Glucose-Capillary 89 70 - 99 mg/dL    Comment: Glucose reference range  applies only to samples taken after fasting for at least 8 hours.  Glucose, capillary     Status: Abnormal   Collection Time: 07/28/22  7:55 PM  Result Value Ref Range   Glucose-Capillary 115 (H) 70 - 99 mg/dL    Comment: Glucose reference range applies only to samples taken after fasting for at least 8 hours.   Recent Results (from the past 240 hour(s))  Blood Culture (routine x 2)     Status: None (Preliminary result)   Collection Time: 07/27/22 10:22 PM   Specimen: BLOOD  Result Value Ref Range Status   Specimen Description BLOOD BLOOD RIGHT HAND  Final   Special Requests   Final    BOTTLES DRAWN AEROBIC AND ANAEROBIC Blood Culture results may not be optimal due to an inadequate volume of blood received in culture bottles   Culture   Final    NO GROWTH < 12 HOURS Performed at [facility], [address], [place], [place] [address]    Report Status PENDING  Incomplete  Blood Culture (routine x 2)     Status: None (Preliminary result)   Collection Time: 07/27/22 10:22 PM   Specimen: BLOOD  Result Value Ref Range Status   Specimen Description BLOOD BLOOD LEFT HAND  Final   Special Requests   Final    BOTTLES DRAWN AEROBIC AND ANAEROBIC Blood Culture adequate volume   Culture   Final    NO GROWTH < 12 HOURS Performed at [facility], [address], [place], [place] [address]    Report Status PENDING  Incomplete  Resp panel by RT-PCR (RSV, Flu A&B, Covid) Anterior Nasal Swab     Status: None   Collection Time: 07/27/22 10:30 PM   Specimen: Anterior Nasal Swab  Result Value Ref Range Status   SARS Coronavirus 2 by RT PCR NEGATIVE NEGATIVE Final    Comment: (NOTE) SARS-CoV-2 target nucleic acids are NOT DETECTED.  The SARS-CoV-2 RNA is generally detectable in upper respiratory specimens during the acute phase of infection. The lowest concentration of SARS-CoV-2 viral copies this assay can detect is 138 copies/mL. A negative result does not preclude SARS-Cov-2 infection and  should not be used as the sole basis for treatment or other patient management decisions. A negative result may occur with  improper specimen collection/handling, submission of specimen other than nasopharyngeal swab, presence of viral mutation(s) within the areas targeted by this assay, and inadequate number of viral copies(<138 copies/mL). A negative result must be combined with clinical observations, patient history, and epidemiological information. The expected result is Negative.  Fact Sheet for Patients:  BloggerCourse.com  Fact Sheet for Healthcare Providers:  SeriousBroker.it  This test is no t yet approved or cleared by the [place] FDA and  has been authorized for detection and/or diagnosis of SARS-CoV-2 by FDA under an Emergency Use Authorization (EUA). This EUA will remain  in effect (meaning this test can be used) for the duration of the COVID-19 declaration under Section 564(b)(1) of the Act, 21 U.S.C.section 360bbb-3(b)(1), unless the authorization is terminated  or revoked sooner.       Influenza A by PCR NEGATIVE NEGATIVE Final   Influenza B by PCR NEGATIVE NEGATIVE Final    Comment: (NOTE) The Xpert Xpress SARS-CoV-2/FLU/RSV plus assay  is intended as an aid in the diagnosis of influenza from Nasopharyngeal swab specimens and should not be used as a sole basis for treatment. Nasal washings and aspirates are unacceptable for Xpert Xpress SARS-CoV-2/FLU/RSV testing.  Fact Sheet for Patients: BloggerCourse.com  Fact Sheet for Healthcare Providers: SeriousBroker.it  This test is not yet approved or cleared by the [place] FDA and has been authorized for detection and/or diagnosis of SARS-CoV-2 by FDA under an Emergency Use Authorization (EUA). This EUA will remain in effect (meaning this test can be used) for the duration of the COVID-19 declaration  under Section 564(b)(1) of the Act, 21 U.S.C. section 360bbb-3(b)(1), unless the authorization is terminated or revoked.     Resp Syncytial Virus by PCR NEGATIVE NEGATIVE Final    Comment: (NOTE) Fact Sheet for Patients: BloggerCourse.com  Fact Sheet for Healthcare Providers: SeriousBroker.it  This test is not yet approved or cleared by the [place] FDA and has been authorized for detection and/or diagnosis of SARS-CoV-2 by FDA under an Emergency Use Authorization (EUA). This EUA will remain in effect (meaning this test can be used) for the duration of the COVID-19 declaration under Section 564(b)(1) of the Act, 21 U.S.C. section 360bbb-3(b)(1), unless the authorization is terminated or revoked.  Performed at [facility], [address] [place], [place] [address]    Creatinine: Recent Labs    07/27/22 2222 07/28/22 0435  CREATININE 1.16* 0.88   Baseline Creatinine: 0.8  Impression/Assessment:  61yo with sepsis from a urinary source, malpositioned right ureteral stent  Plan:  Malpositioned right ureteral stent: The tether was removed from the stent and the stent was repositioned into the bladder. A 16 french foley was then placed. The foley can be removed when the patent is afebrile for 24 hours. He stent will remain in for 14 days Sepsis from a urinary source: please continue broad spectrum antibiotics pending urine culture  Caitlin Thompson 07/28/2022, 9:18 PM

## 2022-07-28 NOTE — H&P (Signed)
History and Physical    Patient: Caitlin Thompson ZOX:[id number] DOB: [birth year] DOA: 07/27/2022 DOS: the patient was seen and examined on 07/28/2022 PCP: Kerri Perches, MD  Patient coming from: Home  Chief Complaint:  Chief Complaint  Patient presents with   Post-op Problem   HPI: Caitlin Thompson is a 62 y.o. female with medical history significant of diabetes mellitus type 2, GERD, hyperlipidemia, hypertension, and more presents ED with a chief complaint of nausea and vomiting.  Patient reports that from the second to the seventh of this month she was admitted to [facility].  At that time she was found to have a 17 mm calculus.  Nephrostomy tube was placed.  On the day of presentation her nephrostomy tube was removed, ureteral stent was placed, and she had lithotripsy.  When she got home she was okay for couple of hours and then she started having nausea and vomiting.  Patient reports she had 17-18 episodes of emesis.  There was no coffee-ground emesis.  No overt blood.  Patient reports that she was feeling feverish on and off.  She had urinary urgency and dysuria.  She had hematuria that cleared throughout the day which was expected.  She had right-sided pelvic pain but no flank pain.  Patient reports her last meal was on the 22nd.  Her last bowel movement was 2 days back.  She does not feel constipated.  After all this nausea and vomiting she did feel lightheaded she denies chest pain, shortness of breath, paresthesias.  Patient has no other complaints at this time.  Patient does not smoke, does not drink, does not use illicit drugs.  She is vaccinated for COVID.  Patient is full code. Review of Systems: As mentioned in the history of present illness. All other systems reviewed and are negative. Past Medical History:  Diagnosis Date   Adenomatous colon polyp 06/26/2011   05/17/11 Colonoscopy Dr Allayne Butcher adenoma Next colonoscopy 05/2016    Complication of anesthesia     DERMATOMYCOSIS 05/23/2010   Qualifier: Diagnosis of  By: Lodema Hong MD, Margaret     Diabetes mellitus type II 03/06/2005   GERD (gastroesophageal reflux disease) 04/03/2011   Non-critical Schatzki's ring, HH on EGD 05/17/11 Dr Jena Gauss    Heart murmur    Hematuria    History of kidney stones    HYDRONEPHROSIS, LEFT 08/30/2009   Qualifier: Diagnosis of  By: Garnette Czech PA, Dawn     HYPERLIPIDEMIA 12/01/2009   Qualifier: Diagnosis of  By: Garnette Czech PA, Dawn     Hypertension    Obesity (BMI 30.0-34.9) 07/15/2011   Renal lithiasis 08/04/2009   Hospitalized a [facility] 3 days in June    Schatzki's ring    THYROID STIMULATING HORMONE, ABNORMAL 08/30/2009   Qualifier: Diagnosis of  By: Garnette Czech PA, Dawn     Tubular adenoma of colon 05/05/2011   Past Surgical History:  Procedure Laterality Date   COLONOSCOPY N/A 01/29/2019   Procedure: COLONOSCOPY;  Surgeon: Corbin Ade, MD;  [facility]: [facility];  Service: Endoscopy;  Laterality: N/A;  2:00   COLONOSCOPY W/ POLYPECTOMY  05/17/11   Rourk-Tubular adenoma colon, benign SB bx   ESOPHAGEAL DILATION  05/17/2011   Rourk-Hiatal hernia/Incomplete noncritical Schatzki's ring/HH   IR NEPHROSTOMY PLACEMENT RIGHT  07/07/2022   POLYPECTOMY  01/29/2019   Procedure: POLYPECTOMY;  Surgeon: Corbin Ade, MD;  Location: [facility];  Service: Endoscopy;;   WISDOM TOOTH EXTRACTION     Social History:  reports that she has never smoked. She has never used smokeless tobacco. She reports that she does not drink alcohol and does not use drugs.  Allergies  Allergen Reactions   Codeine Nausea And Vomiting   Fluconazole Other (See Comments)    Prolonged QT   Parlodel [Bromocriptine Mesylate]     headache   Penicillins Hives   Sulfa Antibiotics Hives, Nausea And Vomiting and Other (See Comments)   Sulfonamide Derivatives Hives    Family History  Problem Relation Age of Onset   Diabetes Mother    Hypertension Mother    Kidney failure Mother     Diabetes Father    Hypertension Father    Kidney failure Father    Diabetes Brother        x2   Crohn's disease Sister    Aneurysm Sister     Prior to Admission medications   Medication Sig Start Date End Date Taking? Authorizing Provider  amLODipine (NORVASC) 10 MG tablet Take 1 tablet (10 mg total) by mouth daily. 07/21/22   Kerri Perches, MD  carvedilol (COREG) 6.25 MG tablet Take 1 tablet (6.25 mg total) by mouth 2 (two) times daily with a meal. 07/21/22   Kerri Perches, MD  diphenhydrAMINE (BENADRYL) 25 mg capsule Take 1 capsule (25 mg total) by mouth every 6 (six) hours as needed for itching or allergies. 07/11/22   Burnadette Pop, MD  glucose blood (ONETOUCH VERIO) test strip USE 1 STRIP TO CHECK GLUCOSE THREE TIMES DAILY 05/17/22   Kerri Perches, MD  insulin NPH-regular Human (70-30) 100 UNIT/ML injection Take 40  units in the morning and take 10 units in the evening Patient taking differently: Inject 0-40 Units into the skin daily. 08/05/21   Kerri Perches, MD  OneTouch Delica Lancets 33G MISC Three times daily testing dx e11.65 03/24/20   Kerri Perches, MD  pantoprazole (PROTONIX) 40 MG tablet Take 1 tablet (40 mg total) by mouth daily. 07/11/22   Burnadette Pop, MD  phenazopyridine (PYRIDIUM) 200 MG tablet Take 1 tablet (200 mg total) by mouth 3 (three) times daily as needed for pain. 07/27/22   Crist Fat, MD  potassium chloride SA (KLOR-CON M) 20 MEQ tablet Take 2 tablets (40 mEq total) by mouth daily for 3 doses. Patient not taking: Reported on 07/21/2022 07/12/22 07/15/22  Burnadette Pop, MD  rosuvastatin (CRESTOR) 10 MG tablet Take 1 tablet (10 mg total) by mouth daily. 09/28/20   Kerri Perches, MD  sodium chloride flush 0.9 % SOLN injection Flush nephrostomy drain once daily with 5 mL of saline 07/08/22   Kennieth Francois, PA  traMADol (ULTRAM) 50 MG tablet Take 1-2 tablets (50-100 mg total) by mouth every 6 (six) hours as needed for moderate  pain. 07/27/22   Crist Fat, MD    Physical Exam: Vitals:   07/28/22 0030 07/28/22 0130 07/28/22 0140 07/28/22 0227  BP: (!) 184/96 (!) 181/98  (!) 193/100  Pulse: (!) 110 (!) 114 (!) 112 (!) 116  Resp: (!) 23 (!) 21 20   Temp:   99 F (37.2 C) 99.1 F (37.3 C)  TempSrc:   Oral   SpO2: 96% 96% 96% 96%  Weight:      Height:       1.  General: Patient sitting on edge of bed,  no acute distress   2. Psychiatric: Alert and oriented x 3, mood and behavior normal for situation, pleasant and cooperative with exam  3. Neurologic: Speech and language are normal, face is symmetric, moves all 4 extremities voluntarily, at baseline without acute deficits on limited exam   4. HEENMT:  Head is atraumatic, normocephalic, pupils reactive to light, neck is supple, trachea is midline, mucous membranes are moist   5. Respiratory : Lungs are clear to auscultation bilaterally without wheezing, rhonchi, rales, no cyanosis, no increase in work of breathing or accessory muscle use   6. Cardiovascular : Heart rate tachycardic, rhythm is regular, no murmurs, rubs or gallops, no peripheral edema, peripheral pulses palpated   7. Gastrointestinal:  Abdomen is soft, nondistended, nontender to palpation bowel sounds active, no masses or organomegaly palpated   8. Skin:  Skin is warm, dry and intact without rashes, acute lesions, or ulcers on limited exam   9.Musculoskeletal:  No acute deformities or trauma, no asymmetry in tone, no peripheral edema, peripheral pulses palpated, no tenderness to palpation in the extremities  Data Reviewed: In the ED Temp 98.3-100.5, heart rate 83-115, respiratory rate 12-29, blood pressure 140/75-184/96, satting 95-100% No leukocytosis with a white blood cell count of 10.5, hemoglobin 13.3, platelets 312 Chemistry reveals a glucose of 305 and a slight bump in creatinine to 1.16 Lactic acid 2.5, 2.9, repeat pending COVID and flu negative UA indicative of  UTI Urine culture pending CT abdomen pelvis shows fragmented calcifications in the right renal pelvis and ureteral stent in place.  Perinephric stranding. Urology was consulted and reports that there is no indication for patient to go to [facility] at this time but that they would let the nephrologist reviewed the procedure note that she was here Patient was started on Rocephin given 3 L of fluid, and started on sepsis protocol Admission was requested for sepsis secondary to pyelonephritis  Assessment and Plan: * Sepsis (HCC) - Patient was febrile, tachycardic, tachypneic at presentation, she also has a lactic acid as high as 2.9 - COVID and flu negative - UA is indicative of UTI and patient did recently have manipulation of the urinary system - Chest x-ray is no acute findings - CT abdomen pelvis shows signs consistent with pyelonephritis - Continue Rocephin - Blood cultures pending - Urine culture pending - Last positive urine culture grew pansensitive E. coli - Patient received 3 L of fluid in the ED and was started on Rocephin - Continue Rocephin - Continue IV fluids - Continue to monitor  Type 2 diabetes mellitus with vascular disease (HCC) - With hyperglycemia at 305 - Sliding scale coverage - Patient takes 7030 sliding scale at home - Continue to monitor  Acute pyelonephritis - Recent procedures done, right kidney including nephrostomy tube being removed, ureteral stent being placed, and lithotripsy - Perinephric fat stranding on CT - Urine is infected on UA - Urine culture pending - Continue Rocephin - Continue to monitor  GERD (gastroesophageal reflux disease) - Continue PPI  Essential hypertension - Continue Coreg and Norvasc   Hyperlipidemia LDL goal <100 - Continue Crestor      Advance Care Planning:   Code Status: Full Code  Consults: Urology consulted from the ER  Family Communication: Sister at bedside  Severity of Illness:  The appropriate  patient status for this patient is INPATIENT. Inpatient status is judged to be reasonable and necessary in order to provide the required intensity of service to ensure the patient's safety. The patient's presenting symptoms, physical exam findings, and initial radiographic and laboratory data in the context of their chronic comorbidities is felt to place them at high  risk for further clinical deterioration. Furthermore, it is not anticipated that the patient will be medically stable for discharge from the hospital within 2 midnights of admission.   * I certify that at the point of admission it is my clinical judgment that the patient will require inpatient hospital care spanning beyond 2 midnights from the point of admission due to high intensity of service, high risk for further deterioration and high frequency of surveillance required.*  Author: Lilyan Gilford, DO 07/28/2022 4:54 AM  For on call review [email or web address].

## 2022-07-28 NOTE — Progress Notes (Signed)
Transition of Care [facility]) - Inpatient Brief Assessment   Patient Details  Name: Caitlin Thompson MRN: [id number] Date of Birth: [birth year]  Transition of Care [facility]) CM/SW Contact:    Leitha Bleak, RN Phone Number: 07/28/2022, 10:02 AM   Clinical Narrative:   Patient admitted with sepsis. Moved to ICU do to increased HR. TOC following.   Transition of Care Asessment: Insurance and Status: (P) Insurance coverage has been reviewed Patient has primary care physician: (P) Yes Home environment has been reviewed: (P) Home Alone Prior level of function:: (P) independant Prior/Current Home Services: (P) No current home services Social Determinants of Health Reivew: (P) SDOH reviewed no interventions necessary Readmission risk has been reviewed: (P) Yes Transition of care needs: (P) no transition of care needs at this time

## 2022-07-28 NOTE — Assessment & Plan Note (Signed)
Continue Coreg and Norvasc 

## 2022-07-28 NOTE — Sepsis Progress Note (Signed)
Notified provider of need to order repeat lactic acid. ° °

## 2022-07-28 NOTE — Assessment & Plan Note (Signed)
Continue PPI ?

## 2022-07-28 NOTE — Progress Notes (Signed)
ASSUMPTION OF CARE NOTE   07/28/2022 10:24 AM  Caitlin Thompson was seen and examined.  The H&P by the admitting provider, orders, imaging was reviewed.  Please see new orders.  Pt has been vomiting consistently since arrival on floor per family at bedside.  Pt also noticed part of the stent coming out.  She is on the floor but no IV fluids running.  HR 120, BP markedly elevated.  Pt appears very ill.  I have spoken with daughter on phone and spoken with family member at bedside.  I have adjusted all the orders. I have ordered to transfer patient to stepdown ICU.  I have reached out to urologist Dr. Ronne Binning and he noted that he will come see patient and to ask staff not to manipulate the stent in any way.  I have increased ceftriaxone dose to 2 gram as I worry that she is bacteremic.  I have added IV compazine as needed for refractory n/v and started IV pantoprazole 40 mg BID.     Vitals:   07/28/22 0915 07/28/22 0957  BP:    Pulse:    Resp:    Temp:  98.2 F (36.8 C)  SpO2: 98%     Results for orders placed or performed during the hospital encounter of 07/27/22  Resp panel by RT-PCR (RSV, Flu A&B, Covid) Anterior Nasal Swab   Specimen: Anterior Nasal Swab  Result Value Ref Range   SARS Coronavirus 2 by RT PCR NEGATIVE NEGATIVE   Influenza A by PCR NEGATIVE NEGATIVE   Influenza B by PCR NEGATIVE NEGATIVE   Resp Syncytial Virus by PCR NEGATIVE NEGATIVE  Blood Culture (routine x 2)   Specimen: BLOOD  Result Value Ref Range   Specimen Description BLOOD BLOOD RIGHT HAND    Special Requests      BOTTLES DRAWN AEROBIC AND ANAEROBIC Blood Culture results may not be optimal due to an inadequate volume of blood received in culture bottles   Culture      NO GROWTH < 12 HOURS Performed at [facility], [address] [place], [place] [address]    Report Status PENDING   Blood Culture (routine x 2)   Specimen: BLOOD  Result Value Ref Range   Specimen Description BLOOD BLOOD LEFT HAND     Special Requests      BOTTLES DRAWN AEROBIC AND ANAEROBIC Blood Culture adequate volume   Culture      NO GROWTH < 12 HOURS Performed at [facility], [address], [place], [place] [address]    Report Status PENDING   Lipase, blood  Result Value Ref Range   Lipase 22 11 - 51 U/L  Comprehensive metabolic panel  Result Value Ref Range   Sodium 133 (L) 135 - 145 mmol/L   Potassium 4.6 3.5 - 5.1 mmol/L   Chloride 98 98 - 111 mmol/L   CO2 20 (L) 22 - 32 mmol/L   Glucose, Bld 305 (H) 70 - 99 mg/dL   BUN 18 6 - 20 mg/dL   Creatinine, Ser 0.98 (H) 0.44 - 1.00 mg/dL   Calcium 9.5 8.9 - 11.9 mg/dL   Total Protein 8.6 (H) 6.5 - 8.1 g/dL   Albumin 3.7 3.5 - 5.0 g/dL   AST 26 15 - 41 U/L   ALT 19 0 - 44 U/L   Alkaline Phosphatase 74 38 - 126 U/L   Total Bilirubin 0.9 0.3 - 1.2 mg/dL   GFR, Estimated 54 (L) >60 mL/min   Anion  gap 15 5 - 15  CBC  Result Value Ref Range   WBC 10.5 4.0 - 10.5 K/uL   RBC 4.31 3.87 - 5.11 MIL/uL   Hemoglobin 13.3 12.0 - 15.0 g/dL   HCT 16.1 09.6 - 04.5 %   MCV 92.8 80.0 - 100.0 fL   MCH 30.9 26.0 - 34.0 pg   MCHC 33.3 30.0 - 36.0 g/dL   RDW 40.9 81.1 - 91.4 %   Platelets 312 150 - 400 K/uL   nRBC 0.0 0.0 - 0.2 %  Lactic acid, plasma  Result Value Ref Range   Lactic Acid, Venous 2.5 (HH) 0.5 - 1.9 mmol/L  Lactic acid, plasma  Result Value Ref Range   Lactic Acid, Venous 2.9 (HH) 0.5 - 1.9 mmol/L  Protime-INR  Result Value Ref Range   Prothrombin Time 13.6 11.4 - 15.2 seconds   INR 1.0 0.8 - 1.2  APTT  Result Value Ref Range   aPTT 28 24 - 36 seconds  Urinalysis, w/ Reflex to Culture (Infection Suspected) -Urine, Clean Catch  Result Value Ref Range   Specimen Source URINE, CLEAN CATCH    Color, Urine AMBER (A) YELLOW   APPearance CLEAR CLEAR   Specific Gravity, Urine 1.023 1.005 - 1.030   pH 6.0 5.0 - 8.0   Glucose, UA >=500 (A) NEGATIVE mg/dL   Hgb urine dipstick LARGE (A) NEGATIVE   Bilirubin Urine NEGATIVE NEGATIVE   Ketones, ur 20 (A)  NEGATIVE mg/dL   Protein, ur 782 (A) NEGATIVE mg/dL   Nitrite POSITIVE (A) NEGATIVE   Leukocytes,Ua LARGE (A) NEGATIVE   RBC / HPF >50 0 - 5 RBC/hpf   WBC, UA >50 0 - 5 WBC/hpf   Bacteria, UA FEW (A) NONE SEEN   Squamous Epithelial / HPF 0-5 0 - 5 /HPF   Renal Epithelial <1    WBC Clumps PRESENT    Budding Yeast PRESENT   Protime-INR  Result Value Ref Range   Prothrombin Time 13.5 11.4 - 15.2 seconds   INR 1.0 0.8 - 1.2  Cortisol-am, blood  Result Value Ref Range   Cortisol - AM 42.5 (H) 6.7 - 22.6 ug/dL  Procalcitonin  Result Value Ref Range   Procalcitonin <0.10 ng/mL  Comprehensive metabolic panel  Result Value Ref Range   Sodium 132 (L) 135 - 145 mmol/L   Potassium 3.7 3.5 - 5.1 mmol/L   Chloride 97 (L) 98 - 111 mmol/L   CO2 23 22 - 32 mmol/L   Glucose, Bld 290 (H) 70 - 99 mg/dL   BUN 14 6 - 20 mg/dL   Creatinine, Ser 9.56 0.44 - 1.00 mg/dL   Calcium 9.3 8.9 - 21.3 mg/dL   Total Protein 8.6 (H) 6.5 - 8.1 g/dL   Albumin 3.5 3.5 - 5.0 g/dL   AST 15 15 - 41 U/L   ALT 15 0 - 44 U/L   Alkaline Phosphatase 75 38 - 126 U/L   Total Bilirubin 0.7 0.3 - 1.2 mg/dL   GFR, Estimated >08 >65 mL/min   Anion gap 12 5 - 15  Magnesium  Result Value Ref Range   Magnesium 1.3 (L) 1.7 - 2.4 mg/dL  CBC with Differential/Platelet  Result Value Ref Range   WBC 10.3 4.0 - 10.5 K/uL   RBC 4.07 3.87 - 5.11 MIL/uL   Hemoglobin 12.5 12.0 - 15.0 g/dL   HCT 78.4 69.6 - 29.5 %   MCV 93.6 80.0 - 100.0 fL   MCH 30.7 26.0 - 34.0  pg   MCHC 32.8 30.0 - 36.0 g/dL   RDW 16.1 09.6 - 04.5 %   Platelets 202 150 - 400 K/uL   nRBC 0.0 0.0 - 0.2 %   Neutrophils Relative % 81 %   Neutro Abs 8.2 (H) 1.7 - 7.7 K/uL   Lymphocytes Relative 15 %   Lymphs Abs 1.6 0.7 - 4.0 K/uL   Monocytes Relative 4 %   Monocytes Absolute 0.4 0.1 - 1.0 K/uL   Eosinophils Relative 0 %   Eosinophils Absolute 0.0 0.0 - 0.5 K/uL   Basophils Relative 0 %   Basophils Absolute 0.0 0.0 - 0.1 K/uL   Immature Granulocytes 0 %    Abs Immature Granulocytes 0.04 0.00 - 0.07 K/uL  Lactic acid, plasma  Result Value Ref Range   Lactic Acid, Venous 1.8 0.5 - 1.9 mmol/L  Glucose, capillary  Result Value Ref Range   Glucose-Capillary 288 (H) 70 - 99 mg/dL  Glucose, capillary  Result Value Ref Range   Glucose-Capillary 298 (H) 70 - 99 mg/dL   Prolonged service time 40 mins  Maryln Manuel, MD [facility]   07/27/2022  9:53 PM How to contact the [facility] Attending or Consulting provider 7A - 7P or covering provider during after hours 7P -7A, for this patient?  Check the care team in [facility] and look for a) attending/consulting [facility] provider listed and b) the [facility] team listed Log into [email or web address] and use [place]'s universal password to access. If you do not have the password, please contact the hospital operator. Locate the [facility] provider you are looking for under [facility] and page to a number that you can be directly reached. If you still have difficulty reaching the provider, please page the [facility] (Director on Call) for the Hospitalists listed on amion for assistance.

## 2022-07-28 NOTE — Assessment & Plan Note (Signed)
-   Patient was febrile, tachycardic, tachypneic at presentation, she also has a lactic acid as high as 2.9 - COVID and flu negative - UA is indicative of UTI and patient did recently have manipulation of the urinary system - Chest x-ray is no acute findings - CT abdomen pelvis shows signs consistent with pyelonephritis - Continue Rocephin - Blood cultures pending - Urine culture pending - Last positive urine culture grew pansensitive E. coli - Patient received 3 L of fluid in the ED and was started on Rocephin - Continue Rocephin - Continue IV fluids - Continue to monitor

## 2022-07-28 NOTE — Assessment & Plan Note (Signed)
-   Recent procedures done, right kidney including nephrostomy tube being removed, ureteral stent being placed, and lithotripsy - Perinephric fat stranding on CT - Urine is infected on UA - Urine culture pending - Continue Rocephin - Continue to monitor

## 2022-07-28 NOTE — Assessment & Plan Note (Signed)
Continue Crestor 

## 2022-07-28 NOTE — Assessment & Plan Note (Signed)
-   With hyperglycemia at 305 - Sliding scale coverage - Patient takes 7030 sliding scale at home - Continue to monitor

## 2022-07-29 DIAGNOSIS — I10 Essential (primary) hypertension: Secondary | ICD-10-CM | POA: Diagnosis not present

## 2022-07-29 DIAGNOSIS — K219 Gastro-esophageal reflux disease without esophagitis: Secondary | ICD-10-CM | POA: Diagnosis not present

## 2022-07-29 DIAGNOSIS — R112 Nausea with vomiting, unspecified: Secondary | ICD-10-CM | POA: Diagnosis not present

## 2022-07-29 DIAGNOSIS — A419 Sepsis, unspecified organism: Secondary | ICD-10-CM | POA: Diagnosis not present

## 2022-07-29 LAB — GLUCOSE, CAPILLARY
Glucose-Capillary: 155 mg/dL — ABNORMAL HIGH (ref 70–99)
Glucose-Capillary: 178 mg/dL — ABNORMAL HIGH (ref 70–99)
Glucose-Capillary: 190 mg/dL — ABNORMAL HIGH (ref 70–99)
Glucose-Capillary: 192 mg/dL — ABNORMAL HIGH (ref 70–99)
Glucose-Capillary: 225 mg/dL — ABNORMAL HIGH (ref 70–99)
Glucose-Capillary: 46 mg/dL — ABNORMAL LOW (ref 70–99)
Glucose-Capillary: 79 mg/dL (ref 70–99)
Glucose-Capillary: 80 mg/dL (ref 70–99)
Glucose-Capillary: 89 mg/dL (ref 70–99)

## 2022-07-29 LAB — COMPREHENSIVE METABOLIC PANEL
ALT: 12 U/L (ref 0–44)
AST: 11 U/L — ABNORMAL LOW (ref 15–41)
Albumin: 2.8 g/dL — ABNORMAL LOW (ref 3.5–5.0)
Alkaline Phosphatase: 53 U/L (ref 38–126)
Anion gap: 9 (ref 5–15)
BUN: 18 mg/dL (ref 6–20)
CO2: 28 mmol/L (ref 22–32)
Calcium: 8.5 mg/dL — ABNORMAL LOW (ref 8.9–10.3)
Chloride: 99 mmol/L (ref 98–111)
Creatinine, Ser: 1.11 mg/dL — ABNORMAL HIGH (ref 0.44–1.00)
GFR, Estimated: 57 mL/min — ABNORMAL LOW (ref >60)
Glucose, Bld: 42 mg/dL — CL (ref 70–99)
Potassium: 3 mmol/L — ABNORMAL LOW (ref 3.5–5.1)
Sodium: 136 mmol/L (ref 135–145)
Total Bilirubin: 0.6 mg/dL (ref 0.3–1.2)
Total Protein: 6.9 g/dL (ref 6.5–8.1)

## 2022-07-29 LAB — URINE CULTURE: Culture: NO GROWTH

## 2022-07-29 LAB — MAGNESIUM: Magnesium: 1.9 mg/dL (ref 1.7–2.4)

## 2022-07-29 MED ORDER — POTASSIUM CHLORIDE 10 MEQ/100ML IV SOLN
10.0000 meq | INTRAVENOUS | Status: AC
  Administered 2022-07-29 (×4): 10 meq via INTRAVENOUS
  Filled 2022-07-29 (×4): qty 100

## 2022-07-29 MED ORDER — DEXTROSE 50 % IV SOLN
INTRAVENOUS | Status: AC
  Administered 2022-07-29: 50 mL
  Filled 2022-07-29: qty 50

## 2022-07-29 MED ORDER — MAGNESIUM SULFATE 2 GM/50ML IV SOLN
2.0000 g | Freq: Once | INTRAVENOUS | Status: AC
  Administered 2022-07-29: 2 g via INTRAVENOUS
  Filled 2022-07-29: qty 50

## 2022-07-29 MED ORDER — ORAL CARE MOUTH RINSE: 15.0000 mL | OROMUCOSAL | Status: DC | PRN

## 2022-07-29 NOTE — Progress Notes (Signed)
Critical lab called from 0330 for glucose of 43.  Dextrose had been given and subsequently improved. Will continue to monitor.

## 2022-07-29 NOTE — Hospital Course (Signed)
62 y.o. female with medical history significant of diabetes mellitus type 2, GERD, hyperlipidemia, hypertension, and more presents ED with a chief complaint of nausea and vomiting.  Patient reports that from the second to the seventh of this month she was admitted to [facility].  At that time she was found to have a 17 mm calculus.  Nephrostomy tube was placed.  On the day of presentation her nephrostomy tube was removed, ureteral stent was placed, and she had lithotripsy.  When she got home she was okay for couple of hours and then she started having nausea and vomiting.  Patient reports she had 17-18 episodes of emesis.  There was no coffee-ground emesis.  No overt blood.  Patient reports that she was feeling feverish on and off.  She had urinary urgency and dysuria.  She had hematuria that cleared throughout the day which was expected.  She had right-sided pelvic pain but no flank pain.  Patient reports her last meal was on the 22nd.  Her last bowel movement was 2 days back.  She does not feel constipated.  After all this nausea and vomiting she did feel lightheaded she denies chest pain, shortness of breath, paresthesias.  Patient has no other complaints at this time.

## 2022-07-29 NOTE — Progress Notes (Signed)
Hypoglycemic Event  CBG: 36   Treatment: 4 oz juice/soda and D50 50 mL (25 gm)  Symptoms: None  Follow-up CBG: Time:1223  CBG Result:178   Possible Reasons for Event: Inadequate meal intake and Unknown  Comments/MD notified:Dr. Carren Rang informed    Tommi Emery

## 2022-07-29 NOTE — Progress Notes (Signed)
Patient was feeling a "little off" as if her blood sugar was low.  Spot checked and it was in the high 80's.  Offered a snack and she felt like she would just eat dinner in a little bit.  Will continue to monitor.

## 2022-07-29 NOTE — Progress Notes (Addendum)
PROGRESS NOTE   Caitlin Thompson  WGN:[id number] DOB: 05-17-1960 DOA: 07/27/2022 PCP: Kerri Perches, MD   Chief Complaint  Patient presents with   Post-op Problem   Level of care: Telemetry  Brief Admission History:   62 y.o. female with medical history significant of diabetes mellitus type 2, GERD, hyperlipidemia, hypertension, and more presents ED with a chief complaint of nausea and vomiting.  Patient reports that from the second to the seventh of this month she was admitted to [facility].  At that time she was found to have a 17 mm calculus.  Nephrostomy tube was placed.  On the day of presentation her nephrostomy tube was removed, ureteral stent was placed, and she had lithotripsy.  When she got home she was okay for couple of hours and then she started having nausea and vomiting.  Patient reports she had 17-18 episodes of emesis.  There was no coffee-ground emesis.  No overt blood.  Patient reports that she was feeling feverish on and off.  She had urinary urgency and dysuria.  She had hematuria that cleared throughout the day which was expected.  She had right-sided pelvic pain but no flank pain.  Patient reports her last meal was on the 22nd.  Her last bowel movement was 2 days back.  She does not feel constipated.  After all this nausea and vomiting she did feel lightheaded she denies chest pain, shortness of breath, paresthesias.  Patient has no other complaints at this time.     Assessment and Plan:  Sepsis from presumed UTI  - Patient presented febrile, tachycardic, tachypneic at presentation, she also has a lactic acid as high as 2.9 - COVID and flu negative - UA is indicative of UTI and patient did recently have manipulation of the urinary system - Chest x-ray is no acute findings - CT abdomen pelvis shows signs consistent with pyelonephritis - Continue Rocephin 2 gm IV every 24 hours  - Blood cultures pending - no growth so far  - Urine culture pending - no growth so  far  - Last positive urine culture grew pansensitive E. coli - Continue Rocephin - Continue IV fluids - Continue to monitor  Type 2 diabetes mellitus with vascular disease  - With hyperglycemia at 305 - Sliding scale coverage - Patient takes 70/30 sliding scale at home - reports takes 40 units in AM if BS >120  - with poor oral intake we have had to stop basal insulin due to low BS  Hypoglycemia from insulin  - pt had a couple of episodes that required treatment - we have stopped all basal insulin at this time - regular diet ordered   Hypokalemia - IV replacement, recheck in AM   Malpositioned right ureteral stent  - Dr. Ronne Binning consulted  5/24 and repositioned into the bladder - foley was placed - per Dr. Ronne Binning can remove foley if afebrile 24 hours - stent will remain in for 14 days    GERD (gastroesophageal reflux disease) - Continue PPI  Essential hypertension - Continue Coreg and Norvasc  Hyperlipidemia LDL goal <100 - Continue Crestor   DVT prophylaxis: [place] heparin  Code Status: F  Family Communication: daughter at bedside  Disposition: Status is: Inpatient Remains inpatient appropriate because: IV antibiotics    Consultants:  Urology Dr. Ronne Binning  Procedures:  Repositioned right ureteral stent 5/24 Dr Ronne Binning  Antimicrobials:  Ceftriaxone 5/23>>   Subjective: Pt reports she is starting to feel better, appetite starting to come back  now.  No flank pain.   Objective: Vitals:   07/29/22 1000 07/29/22 1100 07/29/22 1125 07/29/22 1200  BP: (!) 151/83 (!) 155/69  (!) 153/75  Pulse: 87 86 89 88  Resp: 16 14 14 15   Temp: 98.6 F (37 C) 98.8 F (37.1 C) 98.5 F (36.9 C) 99 F (37.2 C)  TempSrc:   Oral   SpO2: 97% 92% 97% 96%  Weight:      Height:        Intake/Output Summary (Last 24 hours) at 07/29/2022 1416 Last data filed at 07/29/2022 1358 Gross per 24 hour  Intake 4412.69 ml  Output 4200 ml  Net 212.69 ml   Filed Weights   07/27/22  2150  Weight: 83.9 kg   Examination:  General exam: Appears calm and comfortable  Respiratory system: Clear to auscultation. Respiratory effort normal. Cardiovascular system: normal S1 & S2 heard. No JVD, murmurs, rubs, gallops or clicks. No pedal edema. Gastrointestinal system: Abdomen is nondistended, soft and nontender. No organomegaly or masses felt. Normal bowel sounds heard. Central nervous system: Alert and oriented. No focal neurological deficits. Extremities: Symmetric 5 x 5 power. Skin: No rashes, lesions or ulcers. Psychiatry: Judgement and insight appear normal. Mood & affect appropriate.   Data Reviewed: I have personally reviewed following labs and imaging studies  CBC: Recent Labs  Lab 07/27/22 2222 07/28/22 0435  WBC 10.5 10.3  NEUTROABS  --  8.2*  HGB 13.3 12.5  HCT 40.0 38.1  MCV 92.8 93.6  PLT 312 202    Basic Metabolic Panel: Recent Labs  Lab 07/27/22 2222 07/28/22 0435 07/29/22 0348  NA 133* 132* 136  K 4.6 3.7 3.0*  CL 98 97* 99  CO2 20* 23 28  GLUCOSE 305* 290* 42*  BUN 18 14 18   CREATININE 1.16* 0.88 1.11*  CALCIUM 9.5 9.3 8.5*  MG  --  1.3* 1.9    CBG: Recent Labs  Lab 07/29/22 0350 07/29/22 0422 07/29/22 0618 07/29/22 0710 07/29/22 1121  GLUCAP 46* 155* 192* 190* 79    Recent Results (from the past 240 hour(s))  Blood Culture (routine x 2)     Status: None (Preliminary result)   Collection Time: 07/27/22 10:22 PM   Specimen: BLOOD  Result Value Ref Range Status   Specimen Description BLOOD BLOOD RIGHT HAND  Final   Special Requests   Final    BOTTLES DRAWN AEROBIC AND ANAEROBIC Blood Culture results may not be optimal due to an inadequate volume of blood received in culture bottles   Culture   Final    NO GROWTH 2 DAYS Performed at [facility], [address], [place], [place] [address]    Report Status PENDING  Incomplete  Blood Culture (routine x 2)     Status: None (Preliminary result)   Collection Time: 07/27/22  10:22 PM   Specimen: BLOOD  Result Value Ref Range Status   Specimen Description BLOOD BLOOD LEFT HAND  Final   Special Requests   Final    BOTTLES DRAWN AEROBIC AND ANAEROBIC Blood Culture adequate volume   Culture   Final    NO GROWTH 2 DAYS Performed at [facility], [address], [place], [place] [address]    Report Status PENDING  Incomplete  Resp panel by RT-PCR (RSV, Flu A&B, Covid) Anterior Nasal Swab     Status: None   Collection Time: 07/27/22 10:30 PM   Specimen: Anterior Nasal Swab  Result Value Ref Range Status   SARS Coronavirus  2 by RT PCR NEGATIVE NEGATIVE Final    Comment: (NOTE) SARS-CoV-2 target nucleic acids are NOT DETECTED.  The SARS-CoV-2 RNA is generally detectable in upper respiratory specimens during the acute phase of infection. The lowest concentration of SARS-CoV-2 viral copies this assay can detect is 138 copies/mL. A negative result does not preclude SARS-Cov-2 infection and should not be used as the sole basis for treatment or other patient management decisions. A negative result may occur with  improper specimen collection/handling, submission of specimen other than nasopharyngeal swab, presence of viral mutation(s) within the areas targeted by this assay, and inadequate number of viral copies(<138 copies/mL). A negative result must be combined with clinical observations, patient history, and epidemiological information. The expected result is Negative.  Fact Sheet for Patients:  BloggerCourse.com  Fact Sheet for Healthcare Providers:  SeriousBroker.it  This test is no t yet approved or cleared by the [place] FDA and  has been authorized for detection and/or diagnosis of SARS-CoV-2 by FDA under an Emergency Use Authorization (EUA). This EUA will remain  in effect (meaning this test can be used) for the duration of the COVID-19 declaration under Section 564(b)(1) of the Act,  21 U.S.C.section 360bbb-3(b)(1), unless the authorization is terminated  or revoked sooner.       Influenza A by PCR NEGATIVE NEGATIVE Final   Influenza B by PCR NEGATIVE NEGATIVE Final    Comment: (NOTE) The Xpert Xpress SARS-CoV-2/FLU/RSV plus assay is intended as an aid in the diagnosis of influenza from Nasopharyngeal swab specimens and should not be used as a sole basis for treatment. Nasal washings and aspirates are unacceptable for Xpert Xpress SARS-CoV-2/FLU/RSV testing.  Fact Sheet for Patients: BloggerCourse.com  Fact Sheet for Healthcare Providers: SeriousBroker.it  This test is not yet approved or cleared by the [place] FDA and has been authorized for detection and/or diagnosis of SARS-CoV-2 by FDA under an Emergency Use Authorization (EUA). This EUA will remain in effect (meaning this test can be used) for the duration of the COVID-19 declaration under Section 564(b)(1) of the Act, 21 U.S.C. section 360bbb-3(b)(1), unless the authorization is terminated or revoked.     Resp Syncytial Virus by PCR NEGATIVE NEGATIVE Final    Comment: (NOTE) Fact Sheet for Patients: BloggerCourse.com  Fact Sheet for Healthcare Providers: SeriousBroker.it  This test is not yet approved or cleared by the [place] FDA and has been authorized for detection and/or diagnosis of SARS-CoV-2 by FDA under an Emergency Use Authorization (EUA). This EUA will remain in effect (meaning this test can be used) for the duration of the COVID-19 declaration under Section 564(b)(1) of the Act, 21 U.S.C. section 360bbb-3(b)(1), unless the authorization is terminated or revoked.  Performed at [facility], [address], [place], [place] [address]   Urine Culture     Status: None   Collection Time: 07/28/22 12:05 AM   Specimen: Urine, Random  Result Value Ref Range Status   Specimen  Description   Final    URINE, RANDOM Performed at [facility], [address] [place], [place] [address]    Special Requests   Final    NONE Reflexed from [id number] Performed at [facility], [address], [place], [place] [address]    Culture   Final    NO GROWTH Performed at [facility], [address] [place], [place] [address]    Report Status 07/29/2022 FINAL  Final     Radiology Studies: DG CHEST PORT 1 VIEW  Result  Date: 07/28/2022 CLINICAL DATA:  Acute respiratory failure with hypoxia. Aspiration into airway. Increased shortness of breath. EXAM: PORTABLE CHEST 1 VIEW COMPARISON:  Chest radiograph and lung bases from abdominal CT yesterday FINDINGS: The heart is normal in size. Stable mediastinal contours. No acute or developing airspace disease. Normal pulmonary vasculature. No pleural effusion or pneumothorax. Stable osseous structures. IMPRESSION: No acute findings. Electronically Signed   By: Narda Rutherford M.D.   On: 07/28/2022 15:29   CT ABDOMEN PELVIS W CONTRAST  Result Date: 07/27/2022 CLINICAL DATA:  Abdominal pain with nausea and vomiting. Renal stone and nephrostomy tube removal and stent placed on right side. EXAM: CT ABDOMEN AND PELVIS WITH CONTRAST TECHNIQUE: Multidetector CT imaging of the abdomen and pelvis was performed using the standard protocol following bolus administration of intravenous contrast. RADIATION DOSE REDUCTION: This exam was performed according to the departmental dose-optimization program which includes automated exposure control, adjustment of the mA and/or kV according to patient size and/or use of iterative reconstruction technique. CONTRAST:  OMNIPAQUE IOHEXOL 300 MG/ML  SOLN COMPARISON:  07/09/2022. FINDINGS: Lower chest: Mild atelectasis at the lung bases. Hepatobiliary: No focal liver abnormality is seen. No gallstones, gallbladder wall thickening, or biliary dilatation. Pancreas: Unremarkable. No pancreatic ductal dilatation or  surrounding inflammatory changes. Spleen: Normal in size without focal abnormality. Adrenals/Urinary Tract: The adrenal glands are within normal limits. There is focal hypoenhancement of the right kidney in the region of prior nephrostomy tube removal. Calcifications are noted in the lower pole of the left kidney and there are fragmented calcifications at the right renal pelvis. Evaluation is limited due the presence of contrast at the renal pyramids. No obstructive uropathy on the left. There is a right ureteral stent in place with no definite evidence of ureteral calculus or hydronephrosis. Periureteral fat stranding is noted on the right. Small amount of air is noted in the collecting system on the right and in the urinary bladder, likely related to recent procedure. No bladder wall thickening. Stomach/Bowel: A small hiatal hernia is noted. Stomach is within normal limits. Appendix appears normal. No evidence of bowel wall thickening, distention, or inflammatory changes. No free air or pneumatosis. Scattered diverticula are present along the colon without diverticulitis. Vascular/Lymphatic: Aortic atherosclerosis. No enlarged abdominal or pelvic lymph nodes. Reproductive: Uterus and bilateral adnexa are unremarkable. Other: No abdominopelvic ascites. Musculoskeletal: No acute osseous abnormality. IMPRESSION: 1. Fragmented calcifications in the right renal pelvis with a right ureteral stent in place. There is mild prominence of the right renal collecting system with perinephric and periureteral fat stranding, which may be infectious or inflammatory. No definite ureteral calculus or obstructive uropathy. 2. Bilateral nephrolithiasis. 3. Aortic atherosclerosis. Electronically Signed   By: Thornell Sartorius M.D.   On: 07/27/2022 23:45   DG Chest Port 1 View  Result Date: 07/27/2022 CLINICAL DATA:  Possible sepsis EXAM: PORTABLE CHEST 1 VIEW COMPARISON:  07/06/2022 FINDINGS: The heart size and mediastinal contours are  within normal limits. Aortic atherosclerosis. Both lungs are clear. The visualized skeletal structures are unremarkable. IMPRESSION: No active disease. Electronically Signed   By: Jasmine Pang M.D.   On: 07/27/2022 22:33    Scheduled Meds:  amLODipine  10 mg Oral Daily   carvedilol  6.25 mg Oral BID WC   Chlorhexidine Gluconate Cloth  6 each Topical Daily   heparin  5,000 Units Subcutaneous Q8H   insulin aspart  0-9 Units Subcutaneous Q6H   pantoprazole (PROTONIX) IV  40 mg Intravenous Q12H   rosuvastatin  10 mg Oral Daily   Continuous Infusions:  cefTRIAXone (ROCEPHIN)  IV Stopped (07/29/22 6213)   lactated ringers 100 mL/hr at 07/29/22 1007   potassium chloride 10 mEq (07/29/22 1332)     LOS: 1 day   Time spent: 36 mins  Elizeo Rodriques Laural Benes, MD How to contact the [facility] Attending or Consulting provider 7A - 7P or covering provider during after hours 7P -7A, for this patient?  Check the care team in [facility] and look for a) attending/consulting TRH provider listed and b) the [facility] team listed Log into [email or web address] and use [place]'s universal password to access. If you do not have the password, please contact the hospital operator. Locate the [facility] provider you are looking for under [facility] and page to a number that you can be directly reached. If you still have difficulty reaching the provider, please page the [facility] (Director on Call) for the Hospitalists listed on amion for assistance.  07/29/2022, 2:16 PM

## 2022-07-29 NOTE — Progress Notes (Signed)
Hypoglycemic Event  CBG: 46   Treatment: 4 oz juice/soda and D50 50 mL (25 gm)  Symptoms: None  Follow-up CBG: Time:0422   CBG Result:155   Possible Reasons for Event: Unknown  Comments/MD notified:Dr. Z    Lilybeth Vien O Tyffany Waldrop

## 2022-07-30 DIAGNOSIS — I10 Essential (primary) hypertension: Secondary | ICD-10-CM | POA: Diagnosis not present

## 2022-07-30 DIAGNOSIS — K219 Gastro-esophageal reflux disease without esophagitis: Secondary | ICD-10-CM | POA: Diagnosis not present

## 2022-07-30 DIAGNOSIS — A419 Sepsis, unspecified organism: Secondary | ICD-10-CM | POA: Diagnosis not present

## 2022-07-30 DIAGNOSIS — R112 Nausea with vomiting, unspecified: Secondary | ICD-10-CM | POA: Diagnosis not present

## 2022-07-30 LAB — BASIC METABOLIC PANEL
Anion gap: 14 (ref 5–15)
BUN: 13 mg/dL (ref 6–20)
CO2: 23 mmol/L (ref 22–32)
Calcium: 8.5 mg/dL — ABNORMAL LOW (ref 8.9–10.3)
Chloride: 99 mmol/L (ref 98–111)
Creatinine, Ser: 1 mg/dL (ref 0.44–1.00)
GFR, Estimated: >60 mL/min (ref >60)
Glucose, Bld: 146 mg/dL — ABNORMAL HIGH (ref 70–99)
Potassium: 3.7 mmol/L (ref 3.5–5.1)
Sodium: 136 mmol/L (ref 135–145)

## 2022-07-30 LAB — GLUCOSE, CAPILLARY
Glucose-Capillary: 135 mg/dL — ABNORMAL HIGH (ref 70–99)
Glucose-Capillary: 146 mg/dL — ABNORMAL HIGH (ref 70–99)
Glucose-Capillary: 154 mg/dL — ABNORMAL HIGH (ref 70–99)
Glucose-Capillary: 157 mg/dL — ABNORMAL HIGH (ref 70–99)
Glucose-Capillary: 213 mg/dL — ABNORMAL HIGH (ref 70–99)
Glucose-Capillary: 225 mg/dL — ABNORMAL HIGH (ref 70–99)
Glucose-Capillary: 284 mg/dL — ABNORMAL HIGH (ref 70–99)

## 2022-07-30 LAB — CBC
HCT: 33 % — ABNORMAL LOW (ref 36.0–46.0)
Hemoglobin: 10.5 g/dL — ABNORMAL LOW (ref 12.0–15.0)
MCH: 30.7 pg (ref 26.0–34.0)
MCHC: 31.8 g/dL (ref 30.0–36.0)
MCV: 96.5 fL (ref 80.0–100.0)
Platelets: 324 10*3/uL (ref 150–400)
RBC: 3.42 MIL/uL — ABNORMAL LOW (ref 3.87–5.11)
RDW: 11.9 % (ref 11.5–15.5)
WBC: 10.3 10*3/uL (ref 4.0–10.5)
nRBC: 0 % (ref 0.0–0.2)

## 2022-07-30 MED ORDER — INSULIN ASPART 100 UNIT/ML IJ SOLN
0.0000 [IU] | Freq: Three times a day (TID) | INTRAMUSCULAR | Status: DC
  Administered 2022-07-30: 5 [IU] via SUBCUTANEOUS
  Administered 2022-07-30: 3 [IU] via SUBCUTANEOUS

## 2022-07-30 NOTE — TOC Initial Note (Signed)
Transition of Care [facility]) - Initial/Assessment Note    Patient Details  Name: Caitlin Thompson MRN: [id number] Date of Birth: 01-31-1961  Transition of Care (TOC) CM/SW Contact:    Catalina Gravel, LCSW Phone Number: 07/30/2022, 1:36 PM  Clinical Narrative:                 Pt reported to ER with vomiting and nausea, recent surgical procedure see chart.  Pt may have DC needs when ready.  TOC to follow.    Barriers to Discharge: Continued Medical Work up   Patient Goals and CMS Choice            Expected Discharge Plan and Services                                              Prior Living Arrangements/Services                       Activities of Daily Living Home Assistive Devices/Equipment: CBG Meter, Blood pressure cuff, Dentures (specify type) ADL Screening (condition at time of admission) Patient's cognitive ability adequate to safely complete daily activities?: Yes Is the patient deaf or have difficulty hearing?: No Does the patient have difficulty seeing, even when wearing glasses/contacts?: No Does the patient have difficulty concentrating, remembering, or making decisions?: No Patient able to express need for assistance with ADLs?: Yes Does the patient have difficulty dressing or bathing?: No Independently performs ADLs?: Yes (appropriate for developmental age) Does the patient have difficulty walking or climbing stairs?: No Weakness of Legs: Right Weakness of Arms/Hands: None  Permission Sought/Granted                  Emotional Assessment              Admission diagnosis:  Sepsis (HCC) [A41.9] Sepsis, due to unspecified organism, unspecified whether acute organ dysfunction present [facility]) [A41.9] Patient Active Problem List   Diagnosis Date Noted   Sepsis (HCC) 07/28/2022   Intractable nausea and vomiting 07/28/2022   LBBB (left bundle branch block) 07/24/2022   Encounter for support and coordination of transition of  care 07/24/2022   Obstructive nephropathy 07/11/2022   Sepsis secondary to UTI (HCC) 07/07/2022   Hypokalemia 07/07/2022   Prolonged QT interval 07/07/2022   Elevated MCV 07/07/2022   Severe sepsis (HCC) 07/07/2022   Nephrolithiasis 07/06/2022   Dysuria 05/22/2022   Vitamin D deficiency 05/22/2022   Type 2 diabetes mellitus with vascular disease (HCC) 02/19/2022   Obesity (BMI 30.0-34.9) 02/19/2022   Acute pyelonephritis 08/20/2013   GERD (gastroesophageal reflux disease) 04/03/2011   Essential hypertension 03/03/2010   Hyperlipidemia LDL goal <100 12/01/2009   PCP:  Kerri Perches, MD Pharmacy:   [facility] [address] [place] - [address] [place] [address] Phone: [phone] Fax: [phone]     Social Determinants of Health (SDOH) Social History: SDOH Screenings   Food Insecurity: No Food Insecurity (07/07/2022)  Housing: Low Risk  (07/12/2022)  Transportation Needs: No Transportation Needs (07/12/2022)  Utilities: Not At Risk (07/07/2022)  Depression (PHQ2-9): Medium Risk (07/21/2022)  Tobacco Use: Low Risk  (07/28/2022)   SDOH Interventions:     Readmission Risk Interventions    07/08/2022    1:17 PM  Readmission Risk Prevention Plan  Post Dischage Appt Complete  Medication  Screening Complete  Transportation Screening Complete

## 2022-07-30 NOTE — Progress Notes (Signed)
PROGRESS NOTE   Caitlin Thompson  ZOX:[id number] DOB: August 14, 1960 DOA: 07/27/2022 PCP: Kerri Perches, MD   Chief Complaint  Patient presents with   Post-op Problem   Level of care: Telemetry  Brief Admission History:   62 y.o. female with medical history significant of diabetes mellitus type 2, GERD, hyperlipidemia, hypertension, and more presents ED with a chief complaint of nausea and vomiting.  Patient reports that from the second to the seventh of this month she was admitted to [facility].  At that time she was found to have a 17 mm calculus.  Nephrostomy tube was placed.  On the day of presentation her nephrostomy tube was removed, ureteral stent was placed, and she had lithotripsy.  When she got home she was okay for couple of hours and then she started having nausea and vomiting.  Patient reports she had 17-18 episodes of emesis.  There was no coffee-ground emesis.  No overt blood.  Patient reports that she was feeling feverish on and off.  She had urinary urgency and dysuria.  She had hematuria that cleared throughout the day which was expected.  She had right-sided pelvic pain but no flank pain.  Patient reports her last meal was on the 22nd.  Her last bowel movement was 2 days back.  She does not feel constipated.  After all this nausea and vomiting she did feel lightheaded she denies chest pain, shortness of breath, paresthesias.  Patient has no other complaints at this time.     Assessment and Plan:  Sepsis from presumed UTI  - Patient presented febrile, tachycardic, tachypneic at presentation, she also has a lactic acid as high as 2.9 - COVID and flu negative - UA is indicative of UTI and patient did recently have manipulation of the urinary system - Chest x-ray is no acute findings - CT abdomen pelvis shows signs consistent with pyelonephritis - Continue Rocephin 2 gm IV every 24 hours  - Blood cultures pending - no growth so far  - Urine culture pending - no growth so  far  - Last positive urine culture grew pansensitive E. coli - Continue Rocephin for now, plans to transition to oral coverage at DC  - Continue IV fluids - Continue to monitor  Type 2 diabetes mellitus with vascular disease  - With hyperglycemia at 305 - Sliding scale coverage - Patient takes 70/30 sliding scale at home - reports takes 40 units in AM if BS >120  - with poor oral intake we have had to stop basal insulin due to low BS CBG (last 3)  Recent Labs    07/30/22 0412 07/30/22 0552 07/30/22 0721  GLUCAP 135* 146* 154*    Hypoglycemia from insulin  - pt had a couple of episodes that required treatment a couple of days ago  - we have stopped all basal insulin at this time - regular diet ordered  - no further hypoglycemia noted  Hypokalemia - IV replacement given and repleted   Malpositioned right ureteral stent  - Dr. Ronne Binning consulted  5/24 and repositioned into the bladder - foley was placed - per Dr. Ronne Binning can remove foley if afebrile 24 hours - stent will remain in for 14 days  - was instructed by Dr. Marlou Porch to remove stent on Tue May 28 and scheduled for follow and a renal US in 6 weeks   GERD (gastroesophageal reflux disease) - Continue PPI  Essential hypertension - Continue Coreg and Norvasc  Hyperlipidemia LDL goal <100 -  Continue Crestor   DVT prophylaxis: [facility] heparin  Code Status: F  Family Communication: sister at bedside  Disposition: Status is: Inpatient Remains inpatient appropriate because: IV antibiotics    Consultants:  Urology Dr. Ronne Binning  Procedures:  Repositioned right ureteral stent 5/24 Dr Ronne Binning  Antimicrobials:  Ceftriaxone 5/23>>   Subjective: Pt able to eat a little more without having nausea, RLQ abd pain intermittent but tolerable.   Objective: Vitals:   07/30/22 0500 07/30/22 0600 07/30/22 0700 07/30/22 0747  BP: 138/62 (!) 143/63 (!) 130/58   Pulse: 86 90 88   Resp: 15 12 12    Temp:    98.6 F (37 C)   TempSrc:    Oral  SpO2: 93% 95% 93%   Weight:      Height:        Intake/Output Summary (Last 24 hours) at 07/30/2022 0837 Last data filed at 07/30/2022 0304 Gross per 24 hour  Intake 2905.68 ml  Output 2825 ml  Net 80.68 ml   Filed Weights   07/27/22 2150 07/30/22 0426  Weight: 83.9 kg 83.9 kg   Examination:  General exam: Appears calm and comfortable  Respiratory system: Clear to auscultation. Respiratory effort normal. Cardiovascular system: normal S1 & S2 heard. No JVD, murmurs, rubs, gallops or clicks. No pedal edema. Gastrointestinal system: Abdomen is nondistended, soft and mild tenderness RLQ. No organomegaly or masses felt. Normal bowel sounds heard. Central nervous system: Alert and oriented. No focal neurological deficits. Extremities: Symmetric 5 x 5 power. Skin: No rashes, lesions or ulcers. Psychiatry: Judgement and insight appear normal. Mood & affect appropriate.   Data Reviewed: I have personally reviewed following labs and imaging studies  CBC: Recent Labs  Lab 07/27/22 2222 07/28/22 0435 07/30/22 0432  WBC 10.5 10.3 10.3  NEUTROABS  --  8.2*  --   HGB 13.3 12.5 10.5*  HCT 40.0 38.1 33.0*  MCV 92.8 93.6 96.5  PLT 312 202 324    Basic Metabolic Panel: Recent Labs  Lab 07/27/22 2222 07/28/22 0435 07/29/22 0348 07/30/22 0432  NA 133* 132* 136 136  K 4.6 3.7 3.0* 3.7  CL 98 97* 99 99  CO2 20* 23 28 23   GLUCOSE 305* 290* 42* 146*  BUN 18 14 18 13   CREATININE 1.16* 0.88 1.11* 1.00  CALCIUM 9.5 9.3 8.5* 8.5*  MG  --  1.3* 1.9  --     CBG: Recent Labs  Lab 07/29/22 2007 07/30/22 0016 07/30/22 0412 07/30/22 0552 07/30/22 0721  GLUCAP 225* 225* 135* 146* 154*    Recent Results (from the past 240 hour(s))  Blood Culture (routine x 2)     Status: None (Preliminary result)   Collection Time: 07/27/22 10:22 PM   Specimen: BLOOD  Result Value Ref Range Status   Specimen Description BLOOD BLOOD RIGHT HAND  Final   Special Requests    Final    BOTTLES DRAWN AEROBIC AND ANAEROBIC Blood Culture results may not be optimal due to an inadequate volume of blood received in culture bottles   Culture   Final    NO GROWTH 2 DAYS Performed at [facility], [address], [place], [place] [address]    Report Status PENDING  Incomplete  Blood Culture (routine x 2)     Status: None (Preliminary result)   Collection Time: 07/27/22 10:22 PM   Specimen: BLOOD  Result Value Ref Range Status   Specimen Description BLOOD BLOOD LEFT HAND  Final   Special Requests   Final  BOTTLES DRAWN AEROBIC AND ANAEROBIC Blood Culture adequate volume   Culture   Final    NO GROWTH 2 DAYS Performed at [facility], [address], [place], [place] [address]    Report Status PENDING  Incomplete  Resp panel by RT-PCR (RSV, Flu A&B, Covid) Anterior Nasal Swab     Status: None   Collection Time: 07/27/22 10:30 PM   Specimen: Anterior Nasal Swab  Result Value Ref Range Status   SARS Coronavirus 2 by RT PCR NEGATIVE NEGATIVE Final    Comment: (NOTE) SARS-CoV-2 target nucleic acids are NOT DETECTED.  The SARS-CoV-2 RNA is generally detectable in upper respiratory specimens during the acute phase of infection. The lowest concentration of SARS-CoV-2 viral copies this assay can detect is 138 copies/mL. A negative result does not preclude SARS-Cov-2 infection and should not be used as the sole basis for treatment or other patient management decisions. A negative result may occur with  improper specimen collection/handling, submission of specimen other than nasopharyngeal swab, presence of viral mutation(s) within the areas targeted by this assay, and inadequate number of viral copies(<138 copies/mL). A negative result must be combined with clinical observations, patient history, and epidemiological information. The expected result is Negative.  Fact Sheet for Patients:  BloggerCourse.com  Fact Sheet for Healthcare Providers:   SeriousBroker.it  This test is no t yet approved or cleared by the [place] FDA and  has been authorized for detection and/or diagnosis of SARS-CoV-2 by FDA under an Emergency Use Authorization (EUA). This EUA will remain  in effect (meaning this test can be used) for the duration of the COVID-19 declaration under Section 564(b)(1) of the Act, 21 U.S.C.section 360bbb-3(b)(1), unless the authorization is terminated  or revoked sooner.       Influenza A by PCR NEGATIVE NEGATIVE Final   Influenza B by PCR NEGATIVE NEGATIVE Final    Comment: (NOTE) The Xpert Xpress SARS-CoV-2/FLU/RSV plus assay is intended as an aid in the diagnosis of influenza from Nasopharyngeal swab specimens and should not be used as a sole basis for treatment. Nasal washings and aspirates are unacceptable for Xpert Xpress SARS-CoV-2/FLU/RSV testing.  Fact Sheet for Patients: BloggerCourse.com  Fact Sheet for Healthcare Providers: SeriousBroker.it  This test is not yet approved or cleared by the [place] FDA and has been authorized for detection and/or diagnosis of SARS-CoV-2 by FDA under an Emergency Use Authorization (EUA). This EUA will remain in effect (meaning this test can be used) for the duration of the COVID-19 declaration under Section 564(b)(1) of the Act, 21 U.S.C. section 360bbb-3(b)(1), unless the authorization is terminated or revoked.     Resp Syncytial Virus by PCR NEGATIVE NEGATIVE Final    Comment: (NOTE) Fact Sheet for Patients: BloggerCourse.com  Fact Sheet for Healthcare Providers: SeriousBroker.it  This test is not yet approved or cleared by the [place] FDA and has been authorized for detection and/or diagnosis of SARS-CoV-2 by FDA under an Emergency Use Authorization (EUA). This EUA will remain in effect (meaning this test can be used) for  the duration of the COVID-19 declaration under Section 564(b)(1) of the Act, 21 U.S.C. section 360bbb-3(b)(1), unless the authorization is terminated or revoked.  Performed at [facility], [address], [place], [place] [address]   Urine Culture     Status: None   Collection Time: 07/28/22 12:05 AM   Specimen: Urine, Random  Result Value Ref Range Status   Specimen Description   Final    URINE, RANDOM Performed at  [facility], [address], [place], [place] [address]    Special Requests   Final    NONE Reflexed from [id number] Performed at [facility], [address] [place], [place] [address]    Culture   Final    NO GROWTH Performed at [facility], [address], [place], [place] [address]    Report Status 07/29/2022 FINAL  Final     Radiology Studies: DG CHEST PORT 1 VIEW  Result Date: 07/28/2022 CLINICAL DATA:  Acute respiratory failure with hypoxia. Aspiration into airway. Increased shortness of breath. EXAM: PORTABLE CHEST 1 VIEW COMPARISON:  Chest radiograph and lung bases from abdominal CT yesterday FINDINGS: The heart is normal in size. Stable mediastinal contours. No acute or developing airspace disease. Normal pulmonary vasculature. No pleural effusion or pneumothorax. Stable osseous structures. IMPRESSION: No acute findings. Electronically Signed   By: Narda Rutherford M.D.   On: 07/28/2022 15:29    Scheduled Meds:  amLODipine  10 mg Oral Daily   carvedilol  6.25 mg Oral BID WC   Chlorhexidine Gluconate Cloth  6 each Topical Daily   heparin  5,000 Units Subcutaneous Q8H   insulin aspart  0-9 Units Subcutaneous TID WC   pantoprazole (PROTONIX) IV  40 mg Intravenous Q12H   rosuvastatin  10 mg Oral Daily   Continuous Infusions:  cefTRIAXone (ROCEPHIN)  IV 2 g (07/30/22 0559)   lactated ringers 100 mL/hr at 07/30/22 0406     LOS: 2 days   Time spent: 35 mins  Tayten Bergdoll Laural Benes, MD How to contact the [facility] Attending or Consulting provider 7A - 7P or  covering provider during after hours 7P -7A, for this patient?  Check the care team in [facility] and look for a) attending/consulting [facility] provider listed and b) the [facility] team listed Log into [email or web address] and use [facility] universal password to access. If you do not have the password, please contact the hospital operator. Locate the [facility] provider you are looking for under [facility] and page to a number that you can be directly reached. If you still have difficulty reaching the provider, please page the [facility] (Director on Call) for the Hospitalists listed on amion for assistance.  07/30/2022, 8:37 AM

## 2022-07-31 DIAGNOSIS — R6520 Severe sepsis without septic shock: Secondary | ICD-10-CM | POA: Diagnosis not present

## 2022-07-31 DIAGNOSIS — A419 Sepsis, unspecified organism: Secondary | ICD-10-CM | POA: Diagnosis not present

## 2022-07-31 DIAGNOSIS — R112 Nausea with vomiting, unspecified: Secondary | ICD-10-CM | POA: Diagnosis not present

## 2022-07-31 DIAGNOSIS — K219 Gastro-esophageal reflux disease without esophagitis: Secondary | ICD-10-CM | POA: Diagnosis not present

## 2022-07-31 LAB — GLUCOSE, CAPILLARY
Glucose-Capillary: 171 mg/dL — ABNORMAL HIGH (ref 70–99)
Glucose-Capillary: 183 mg/dL — ABNORMAL HIGH (ref 70–99)

## 2022-07-31 MED ORDER — CEFDINIR 300 MG PO CAPS: 300.0000 mg | ORAL_CAPSULE | Freq: Two times a day (BID) | ORAL | 0 refills | Status: AC

## 2022-07-31 MED ORDER — INSULIN NPH ISOPHANE & REGULAR (70-30) 100 UNIT/ML ~~LOC~~ SUSP: SUBCUTANEOUS | 11 refills | Status: DC

## 2022-07-31 NOTE — Discharge Instructions (Signed)
IMPORTANT INFORMATION: PAY CLOSE ATTENTION   PHYSICIAN DISCHARGE INSTRUCTIONS  Follow with Primary care provider  Simpson, Margaret E, MD  and other consultants as instructed by your Hospitalist Physician  SEEK MEDICAL CARE OR RETURN TO EMERGENCY ROOM IF SYMPTOMS COME BACK, WORSEN OR NEW PROBLEM DEVELOPS   Please note: You were cared for by a hospitalist during your hospital stay. Every effort will be made to forward records to your primary care provider.  You can request that your primary care provider send for your hospital records if they have not received them.  Once you are discharged, your primary care physician will handle any further medical issues. Please note that NO REFILLS for any discharge medications will be authorized once you are discharged, as it is imperative that you return to your primary care physician (or establish a relationship with a primary care physician if you do not have one) for your post hospital discharge needs so that they can reassess your need for medications and monitor your lab values.  Please get a complete blood count and chemistry panel checked by your Primary MD at your next visit, and again as instructed by your Primary MD.  Get Medicines reviewed and adjusted: Please take all your medications with you for your next visit with your Primary MD  Laboratory/radiological data: Please request your Primary MD to go over all hospital tests and procedure/radiological results at the follow up, please ask your primary care provider to get all Hospital records sent to his/her office.  In some cases, they will be blood work, cultures and biopsy results pending at the time of your discharge. Please request that your primary care provider follow up on these results.  If you are diabetic, please bring your blood sugar readings with you to your follow up appointment with primary care.    Please call and make your follow up appointments as soon as possible.    Also  Note the following: If you experience worsening of your admission symptoms, develop shortness of breath, life threatening emergency, suicidal or homicidal thoughts you must seek medical attention immediately by calling 911 or calling your MD immediately  if symptoms less severe.  You must read complete instructions/literature along with all the possible adverse reactions/side effects for all the Medicines you take and that have been prescribed to you. Take any new Medicines after you have completely understood and accpet all the possible adverse reactions/side effects.   Do not drive when taking Pain medications or sleeping medications (Benzodiazepines)  Do not take more than prescribed Pain, Sleep and Anxiety Medications. It is not advisable to combine anxiety,sleep and pain medications without talking with your primary care practitioner  Special Instructions: If you have smoked or chewed Tobacco  in the last 2 yrs please stop smoking, stop any regular Alcohol  and or any Recreational drug use.  Wear Seat belts while driving.  Do not drive if taking any narcotic, mind altering or controlled substances or recreational drugs or alcohol.       

## 2022-07-31 NOTE — Plan of Care (Signed)
  Problem: Education: Goal: Knowledge of General Education information will improve Description: Including pain rating scale, medication(s)/side effects and non-pharmacologic comfort measures Outcome: Adequate for Discharge   Problem: Health Behavior/Discharge Planning: Goal: Ability to manage health-related needs will improve Outcome: Adequate for Discharge   Problem: Clinical Measurements: Goal: Ability to maintain clinical measurements within normal limits will improve Outcome: Adequate for Discharge Goal: Will remain free from infection Outcome: Adequate for Discharge Goal: Diagnostic test results will improve Outcome: Adequate for Discharge Goal: Respiratory complications will improve Outcome: Adequate for Discharge Goal: Cardiovascular complication will be avoided Outcome: Adequate for Discharge   Problem: Activity: Goal: Risk for activity intolerance will decrease Outcome: Adequate for Discharge   Problem: Nutrition: Goal: Adequate nutrition will be maintained Outcome: Adequate for Discharge   Problem: Coping: Goal: Level of anxiety will decrease Outcome: Adequate for Discharge   Problem: Elimination: Goal: Will not experience complications related to bowel motility Outcome: Adequate for Discharge Goal: Will not experience complications related to urinary retention Outcome: Adequate for Discharge   Problem: Pain Managment: Goal: General experience of comfort will improve Outcome: Adequate for Discharge   Problem: Safety: Goal: Ability to remain free from injury will improve Outcome: Adequate for Discharge   Problem: Skin Integrity: Goal: Risk for impaired skin integrity will decrease Outcome: Adequate for Discharge   Problem: Education: Goal: Ability to describe self-care measures that may prevent or decrease complications (Diabetes Survival Skills Education) will improve Outcome: Adequate for Discharge Goal: Individualized Educational Video(s) Outcome:  Adequate for Discharge   Problem: Coping: Goal: Ability to adjust to condition or change in health will improve Outcome: Adequate for Discharge   Problem: Fluid Volume: Goal: Ability to maintain a balanced intake and output will improve Outcome: Adequate for Discharge   Problem: Health Behavior/Discharge Planning: Goal: Ability to identify and utilize available resources and services will improve Outcome: Adequate for Discharge Goal: Ability to manage health-related needs will improve Outcome: Adequate for Discharge   Problem: Metabolic: Goal: Ability to maintain appropriate glucose levels will improve Outcome: Adequate for Discharge   Problem: Nutritional: Goal: Maintenance of adequate nutrition will improve Outcome: Adequate for Discharge Goal: Progress toward achieving an optimal weight will improve Outcome: Adequate for Discharge   Problem: Skin Integrity: Goal: Risk for impaired skin integrity will decrease Outcome: Adequate for Discharge   Problem: Tissue Perfusion: Goal: Adequacy of tissue perfusion will improve Outcome: Adequate for Discharge   Problem: Fluid Volume: Goal: Hemodynamic stability will improve Outcome: Adequate for Discharge   Problem: Clinical Measurements: Goal: Diagnostic test results will improve Outcome: Adequate for Discharge Goal: Signs and symptoms of infection will decrease Outcome: Adequate for Discharge   Problem: Respiratory: Goal: Ability to maintain adequate ventilation will improve Outcome: Adequate for Discharge   

## 2022-07-31 NOTE — Discharge Summary (Signed)
Physician Discharge Summary  Caitlin Thompson ZOX:[id number] DOB: [birth year] DOA: 07/27/2022  PCP: Kerri Perches, MD  Admit date: 07/27/2022 Discharge date: 07/31/2022  Admitted From:  Home  Disposition:  Home   Recommendations for Outpatient Follow-up:  Follow up with urology in 2 weeks - Pt requested to follow up at [facility] in [place]   Please follow up on the following pending results: Final culture data   Home Health: n/a   Discharge Condition: STABLE   CODE STATUS: FULL DIET: resume previous home diet    Brief Hospitalization Summary: Please see all hospital notes, images, labs for full details of the hospitalization. ADMISSION PROVIDER HPI:  62 y.o. female with medical history significant of diabetes mellitus type 2, GERD, hyperlipidemia, hypertension, and more presents ED with a chief complaint of nausea and vomiting.  Patient reports that from the second to the seventh of this month she was admitted to [facility].  At that time she was found to have a 17 mm calculus.  Nephrostomy tube was placed.  On the day of presentation her nephrostomy tube was removed, ureteral stent was placed, and she had lithotripsy.  When she got home she was okay for couple of hours and then she started having nausea and vomiting.  Patient reports she had 17-18 episodes of emesis.  There was no coffee-ground emesis.  No overt blood.  Patient reports that she was feeling feverish on and off.  She had urinary urgency and dysuria.  She had hematuria that cleared throughout the day which was expected.  She had right-sided pelvic pain but no flank pain.  Patient reports her last meal was on the 22nd.  Her last bowel movement was 2 days back.  She does not feel constipated.  After all this nausea and vomiting she did feel lightheaded she denies chest pain, shortness of breath, paresthesias.  Patient has no other complaints at this time.   HOSPITAL COURSE BY PROBLEM   Sepsis from presumed UTI  - RESOLVED  - Patient presented febrile, tachycardic, tachypneic at presentation, she also has a lactic acid as high as 2.9 - COVID and flu negative - UA was indicative of UTI and patient did recently have manipulation of the urinary system - Chest x-ray is no acute findings - treated with Rocephin 2 gm IV every 24 hours  - Blood cultures pending - no growth so far  - Urine culture pending - no growth so far  - pyelonephritis ruled out - transitioned to oral cefdinir at DC  - Hydrated with IV fluids - Dr. Ronne Binning said that stent to be removed in 2 weeks. Pt requested to follow up with [facility] [place]    Type 2 diabetes mellitus with vascular disease  - With hyperglycemia at 305 - Sliding scale coverage - Patient takes 70/30 sliding scale at home - reports takes 40 units in AM if BS >120  - with poor oral intake we have had to stop basal insulin due to low BS CBG (last 3)  Recent Labs (last 2 labs)       Recent Labs    07/30/22 0412 07/30/22 0552 07/30/22 0721  GLUCAP 135* 146* 154*        Hypoglycemia from insulin  - pt had a couple of episodes that required treatment a couple of days ago  - we have stopped all basal insulin in hospital until oral intake improved - we recommended reducing home insulin doses significantly to 10 units  with breakfast and 10 units with supper - no further hypoglycemia noted   Hypokalemia - IV replacement given and repleted    Malpositioned right ureteral stent  - Dr. Ronne Binning consulted  5/24 and repositioned into the bladder - foley was placed - per Dr. Ronne Binning can remove foley if afebrile 24 hours and foley was removed prior to discharge  - stent should remain in for 14 days - pt requested to follow up with [facility] [place]  - scheduled for follow and a renal US in 6 weeks    GERD (gastroesophageal reflux disease) - Continue PPI   Essential hypertension - Continue Coreg and Norvasc   Hyperlipidemia LDL  goal <100 - Continue Crestor     DVT prophylaxis: [place] heparin  Code Status: Full Family Communication: sister at bedside  Disposition: Status is: Inpatient Remains inpatient appropriate because: IV antibiotics    Consultants:  Urology Dr. Ronne Binning  Procedures:  Repositioned right ureteral stent and foley placement 5/24 Dr Ronne Binning   Antimicrobials:  Ceftriaxone 5/23>>5/27  Discharge Diagnoses:  Principal Problem:   Sepsis (HCC) Active Problems:   Hyperlipidemia LDL goal <100   Essential hypertension   GERD (gastroesophageal reflux disease)   Acute pyelonephritis   Type 2 diabetes mellitus with vascular disease (HCC)   Intractable nausea and vomiting   Discharge Instructions: Discharge Instructions     Ambulatory referral to Urology   Complete by: As directed    Hospital Follow up in 2 weeks for stent removal      Allergies as of 07/31/2022       Reactions   Codeine Nausea And Vomiting   Fluconazole Other (See Comments)   Prolonged QT   Parlodel [bromocriptine Mesylate]    headache   Penicillins Hives   Sulfa Antibiotics Hives, Nausea And Vomiting, Other (See Comments)   Sulfonamide Derivatives Hives        Medication List     STOP taking these medications    phenazopyridine 200 MG tablet Commonly known as: Pyridium   potassium chloride SA 20 MEQ tablet Commonly known as: KLOR-CON M       TAKE these medications    amLODipine 10 MG tablet Commonly known as: NORVASC Take 1 tablet (10 mg total) by mouth daily.   carvedilol 6.25 MG tablet Commonly known as: COREG Take 1 tablet (6.25 mg total) by mouth 2 (two) times daily with a meal.   cefdinir 300 MG capsule Commonly known as: OMNICEF Take 1 capsule (300 mg total) by mouth 2 (two) times daily for 2 days. Start taking on: Aug 01, 2022   diphenhydrAMINE 25 mg capsule Commonly known as: BENADRYL Take 1 capsule (25 mg total) by mouth every 6 (six) hours as needed for itching or allergies.    insulin NPH-regular Human (70-30) 100 UNIT/ML injection Take 10 units in the morning with breakfast and take 10 units with supper What changed: additional instructions   OneTouch Delica Lancets 33G Misc Three times daily testing dx e11.65   OneTouch Verio test strip Generic drug: glucose blood USE 1 STRIP TO CHECK GLUCOSE THREE TIMES DAILY   pantoprazole 40 MG tablet Commonly known as: PROTONIX Take 1 tablet (40 mg total) by mouth daily.   rosuvastatin 10 MG tablet Commonly known as: Crestor Take 1 tablet (10 mg total) by mouth daily.   sodium chloride flush 0.9 % Soln injection Flush nephrostomy drain once daily with 5 mL of saline   traMADol 50 MG tablet Commonly known as: Ultram  Take 1-2 tablets (50-100 mg total) by mouth every 6 (six) hours as needed for moderate pain.        Follow-up Information     [facility] [place]. Schedule an appointment as soon as possible for a visit in 2 week(s).   Why: Hospital Follow Up and stent removal Contact information: [address] [place] [address] [phone]               Allergies  Allergen Reactions   Codeine Nausea And Vomiting   Fluconazole Other (See Comments)    Prolonged QT   Parlodel [Bromocriptine Mesylate]     headache   Penicillins Hives   Sulfa Antibiotics Hives, Nausea And Vomiting and Other (See Comments)   Sulfonamide Derivatives Hives   Allergies as of 07/31/2022       Reactions   Codeine Nausea And Vomiting   Fluconazole Other (See Comments)   Prolonged QT   Parlodel [bromocriptine Mesylate]    headache   Penicillins Hives   Sulfa Antibiotics Hives, Nausea And Vomiting, Other (See Comments)   Sulfonamide Derivatives Hives        Medication List     STOP taking these medications    phenazopyridine 200 MG tablet Commonly known as: Pyridium   potassium chloride SA 20 MEQ tablet Commonly known as: KLOR-CON M       TAKE these  medications    amLODipine 10 MG tablet Commonly known as: NORVASC Take 1 tablet (10 mg total) by mouth daily.   carvedilol 6.25 MG tablet Commonly known as: COREG Take 1 tablet (6.25 mg total) by mouth 2 (two) times daily with a meal.   cefdinir 300 MG capsule Commonly known as: OMNICEF Take 1 capsule (300 mg total) by mouth 2 (two) times daily for 2 days. Start taking on: Aug 01, 2022   diphenhydrAMINE 25 mg capsule Commonly known as: BENADRYL Take 1 capsule (25 mg total) by mouth every 6 (six) hours as needed for itching or allergies.   insulin NPH-regular Human (70-30) 100 UNIT/ML injection Take 10 units in the morning with breakfast and take 10 units with supper What changed: additional instructions   OneTouch Delica Lancets 33G Misc Three times daily testing dx e11.65   OneTouch Verio test strip Generic drug: glucose blood USE 1 STRIP TO CHECK GLUCOSE THREE TIMES DAILY   pantoprazole 40 MG tablet Commonly known as: PROTONIX Take 1 tablet (40 mg total) by mouth daily.   rosuvastatin 10 MG tablet Commonly known as: Crestor Take 1 tablet (10 mg total) by mouth daily.   sodium chloride flush 0.9 % Soln injection Flush nephrostomy drain once daily with 5 mL of saline   traMADol 50 MG tablet Commonly known as: Ultram Take 1-2 tablets (50-100 mg total) by mouth every 6 (six) hours as needed for moderate pain.        Procedures/Studies: DG CHEST PORT 1 VIEW  Result Date: 07/28/2022 CLINICAL DATA:  Acute respiratory failure with hypoxia. Aspiration into airway. Increased shortness of breath. EXAM: PORTABLE CHEST 1 VIEW COMPARISON:  Chest radiograph and lung bases from abdominal CT yesterday FINDINGS: The heart is normal in size. Stable mediastinal contours. No acute or developing airspace disease. Normal pulmonary vasculature. No pleural effusion or pneumothorax. Stable osseous structures. IMPRESSION: No acute findings. Electronically Signed   By: Narda Rutherford M.D.    On: 07/28/2022 15:29   CT ABDOMEN PELVIS W CONTRAST  Result Date: 07/27/2022 CLINICAL DATA:  Abdominal pain  with nausea and vomiting. Renal stone and nephrostomy tube removal and stent placed on right side. EXAM: CT ABDOMEN AND PELVIS WITH CONTRAST TECHNIQUE: Multidetector CT imaging of the abdomen and pelvis was performed using the standard protocol following bolus administration of intravenous contrast. RADIATION DOSE REDUCTION: This exam was performed according to the departmental dose-optimization program which includes automated exposure control, adjustment of the mA and/or kV according to patient size and/or use of iterative reconstruction technique. CONTRAST:  OMNIPAQUE IOHEXOL 300 MG/ML  SOLN COMPARISON:  07/09/2022. FINDINGS: Lower chest: Mild atelectasis at the lung bases. Hepatobiliary: No focal liver abnormality is seen. No gallstones, gallbladder wall thickening, or biliary dilatation. Pancreas: Unremarkable. No pancreatic ductal dilatation or surrounding inflammatory changes. Spleen: Normal in size without focal abnormality. Adrenals/Urinary Tract: The adrenal glands are within normal limits. There is focal hypoenhancement of the right kidney in the region of prior nephrostomy tube removal. Calcifications are noted in the lower pole of the left kidney and there are fragmented calcifications at the right renal pelvis. Evaluation is limited due the presence of contrast at the renal pyramids. No obstructive uropathy on the left. There is a right ureteral stent in place with no definite evidence of ureteral calculus or hydronephrosis. Periureteral fat stranding is noted on the right. Small amount of air is noted in the collecting system on the right and in the urinary bladder, likely related to recent procedure. No bladder wall thickening. Stomach/Bowel: A small hiatal hernia is noted. Stomach is within normal limits. Appendix appears normal. No evidence of bowel wall thickening, distention, or  inflammatory changes. No free air or pneumatosis. Scattered diverticula are present along the colon without diverticulitis. Vascular/Lymphatic: Aortic atherosclerosis. No enlarged abdominal or pelvic lymph nodes. Reproductive: Uterus and bilateral adnexa are unremarkable. Other: No abdominopelvic ascites. Musculoskeletal: No acute osseous abnormality. IMPRESSION: 1. Fragmented calcifications in the right renal pelvis with a right ureteral stent in place. There is mild prominence of the right renal collecting system with perinephric and periureteral fat stranding, which may be infectious or inflammatory. No definite ureteral calculus or obstructive uropathy. 2. Bilateral nephrolithiasis. 3. Aortic atherosclerosis. Electronically Signed   By: Thornell Sartorius M.D.   On: 07/27/2022 23:45   DG Chest Port 1 View  Result Date: 07/27/2022 CLINICAL DATA:  Possible sepsis EXAM: PORTABLE CHEST 1 VIEW COMPARISON:  07/06/2022 FINDINGS: The heart size and mediastinal contours are within normal limits. Aortic atherosclerosis. Both lungs are clear. The visualized skeletal structures are unremarkable. IMPRESSION: No active disease. Electronically Signed   By: Jasmine Pang M.D.   On: 07/27/2022 22:33   DG C-Arm 1-60 Min-No Report  Result Date: 07/27/2022 Fluoroscopy was utilized by the requesting physician.  No radiographic interpretation.   DG C-Arm 1-60 Min-No Report  Result Date: 07/27/2022 Fluoroscopy was utilized by the requesting physician.  No radiographic interpretation.   ECHOCARDIOGRAM COMPLETE  Result Date: 07/25/2022    ECHOCARDIOGRAM REPORT   Patient Name:   JAYCEE LUAN Date of Exam: 07/25/2022 Medical Rec #:  [id number]          Height:       64.0 in Accession #:    [id number]         Weight:       185.0 lb Date of Birth:  1960/07/11          BSA:          1.893 m Patient Age:    78 years  BP:           156/88 mmHg Patient Gender: F                  HR:           93 bpm. Exam Location:   [address] Procedure: 2D Echo, Cardiac Doppler, Color Doppler and 3D Echo Indications:    Abnormal ECG R94.31; ; F62.130 Encounter for other preprocedural                 examination  History:        Patient has no prior history of Echocardiogram examinations.                 Risk Factors:Hypertension, Diabetes and Dyslipidemia.  Sonographer:    Thurman Coyer RDCS Referring Phys: [id number] [facility] E BRANCH IMPRESSIONS  1. Left ventricular ejection fraction, by estimation, is 50 to 55%. Left ventricular ejection fraction by 3D volume is 51 %. The left ventricle has low normal function. The left ventricle has no regional wall motion abnormalities. Left ventricular diastolic parameters are consistent with Grade I diastolic dysfunction (impaired relaxation).  2. Right ventricular systolic function is normal. The right ventricular size is normal.  3. The mitral valve is normal in structure. No evidence of mitral valve regurgitation. No evidence of mitral stenosis.  4. The aortic valve is normal in structure. Aortic valve regurgitation is not visualized. No aortic stenosis is present.  5. The inferior vena cava is normal in size with greater than 50% respiratory variability, suggesting right atrial pressure of 3 mmHg. FINDINGS  Left Ventricle: Left ventricular ejection fraction, by estimation, is 50 to 55%. Left ventricular ejection fraction by 3D volume is 51 %. The left ventricle has low normal function. The left ventricle has no regional wall motion abnormalities. The left ventricular internal cavity size was normal in size. There is no left ventricular hypertrophy. Left ventricular diastolic parameters are consistent with Grade I diastolic dysfunction (impaired relaxation). Right Ventricle: The right ventricular size is normal. No increase in right ventricular wall thickness. Right ventricular systolic function is normal. Left Atrium: Left atrial size was normal in size. Right Atrium: Right atrial size was normal in  size. Pericardium: There is no evidence of pericardial effusion. Mitral Valve: The mitral valve is normal in structure. No evidence of mitral valve regurgitation. No evidence of mitral valve stenosis. Tricuspid Valve: The tricuspid valve is normal in structure. Tricuspid valve regurgitation is not demonstrated. No evidence of tricuspid stenosis. Aortic Valve: The aortic valve is normal in structure. Aortic valve regurgitation is not visualized. No aortic stenosis is present. Pulmonic Valve: The pulmonic valve was normal in structure. Pulmonic valve regurgitation is not visualized. No evidence of pulmonic stenosis. Aorta: The aortic root is normal in size and structure. Venous: The inferior vena cava is normal in size with greater than 50% respiratory variability, suggesting right atrial pressure of 3 mmHg. IAS/Shunts: No atrial level shunt detected by color flow Doppler.  LEFT VENTRICLE PLAX 2D LVIDd:         3.50 cm         Diastology LVIDs:         2.60 cm         LV e' medial:    6.74 cm/s LV PW:         1.00 cm         LV E/e' medial:  12.7 LV IVS:        1.00  cm         LV e' lateral:   6.96 cm/s LVOT diam:     2.10 cm         LV E/e' lateral: 12.3 LV SV:         56 LV SV Index:   29 LVOT Area:     3.46 cm        3D Volume EF                                LV 3D EF:    Left                                             ventricul                                             ar                                             ejection                                             fraction                                             by 3D                                             volume is                                             51 %.                                 3D Volume EF:                                3D EF:        51 %                                LV EDV:       101 ml                                LV ESV:       49 ml  LV SV:        51 ml RIGHT VENTRICLE RV S prime:     10.30  cm/s TAPSE (M-mode): 2.1 cm LEFT ATRIUM             Index LA diam:        4.00 cm 2.11 cm/m LA Vol (A2C):   30.5 ml 16.12 ml/m LA Vol (A4C):   35.3 ml 18.65 ml/m LA Biplane Vol: 33.3 ml 17.59 ml/m  AORTIC VALVE LVOT Vmax:   87.00 cm/s LVOT Vmean:  59.700 cm/s LVOT VTI:    0.161 m  AORTA Ao Root diam: 2.90 cm MITRAL VALVE MV Area (PHT): 5.42 cm     SHUNTS MV Decel Time: 140 msec     Systemic VTI:  0.16 m MV E velocity: 85.90 cm/s   Systemic Diam: 2.10 cm MV A velocity: 118.00 cm/s MV E/A ratio:  0.73 Donato Schultz MD Electronically signed by Donato Schultz MD Signature Date/Time: 07/25/2022/4:15:46 PM    Final    CT ABDOMEN PELVIS W CONTRAST  Result Date: 07/09/2022 CLINICAL DATA:  62 year old female with recent sepsis due to right side obstructive uropathy. Status post percutaneous nephrostomy. Continued right flank and upper quadrant pain. Leukocytosis. EXAM: CT ABDOMEN AND PELVIS WITH CONTRAST TECHNIQUE: Multidetector CT imaging of the abdomen and pelvis was performed using the standard protocol following bolus administration of intravenous contrast. RADIATION DOSE REDUCTION: This exam was performed according to the departmental dose-optimization program which includes automated exposure control, adjustment of the mA and/or kV according to patient size and/or use of iterative reconstruction technique. CONTRAST:  OMNIPAQUE IOHEXOL 300 MG/ML  SOLN COMPARISON:  *INSERT* by CT 07/06/2022. FINDINGS: Lower chest: New small layering pleural effusions. Simple fluid density suggesting transudate. Confluent but enhancing bilateral lower lobe atelectasis. No pericardial effusion. Hepatobiliary: Some vicarious contrast excretion to the gallbladder. Otherwise negative liver and gallbladder. Pancreas: Negative. Spleen: Negative. Adrenals/Urinary Tract: Normal adrenal glands. Left kidney stable developing lower pole staghorn type calculus. Left pararenal edema has increased (series 2, image 27), but renal enhancement  remains within normal limits. Left ureter not significantly changed. New percutaneous right side nephrostomy since the CT on 07/06/2022. Satisfactory tube placement. Regressed right hydronephrosis. Obstructing stone is now centrally located at the pigtail of the catheter on series 2, image 30, up to 17 mm long axis. The right ureter is relatively decompressed now, but there is periureteral and retroperitoneal inflammation which tracks to the pelvic inlet. Right renal enhancement is within normal limits. Right pararenal edema has decreased. On the delayed phase images there is left renal excretion to the ureter. The right nephrogram appears symmetric although no contrast excretion to the ureter. Unremarkable bladder. Stomach/Bowel: No dilated or inflamed bowel. Diverticulosis of both the ascending and sigmoid colon. Normal appendix, coronal image 52. No free air or free fluid. Vascular/Lymphatic: Aortoiliac calcified atherosclerosis. Major arterial structures remain patent. Normal caliber abdominal aorta. Portal venous system timing is early on the initial phase, grossly patent main portal vein on the delayed phase. No lymphadenopathy. Reproductive: Stable, within normal limits. Other: No pelvis free fluid.  Numerous pelvic phleboliths. Musculoskeletal: No acute osseous abnormality identified. Lower lumbar facet degeneration. IMPRESSION: 1. Satisfactory new percutaneous Right nephrostomy since the CT on 07/06/2022. Regressed right hydronephrosis and pararenal edema. The large 17 mm stone now located in the right renal hilum surrounded by the nephrostomy pigtail. Ongoing inflammatory stranding along the right ureter to the pelvis. 2. Mild increased left pararenal edema, nonspecific. Stable lower pole developing  staghorn calculus with no left hydronephrosis or evidence of pyelonephritis. 3. New small layering pleural effusions with bilateral lower lobe atelectasis. 4. Normal appendix. Diverticulosis of the large bowel.  Aortic Atherosclerosis (ICD10-I70.0). Electronically Signed   By: Odessa Fleming M.D.   On: 07/09/2022 12:46   IR NEPHROSTOMY PLACEMENT RIGHT  Result Date: 07/07/2022 CLINICAL DATA:  Sepsis secondary to obstructing calculus of the right renal pelvis causing hydronephrosis. The patient requires decompression of the right renal collecting system with percutaneous nephrostomy. EXAM: 1. ULTRASOUND GUIDANCE FOR PUNCTURE OF THE RIGHT RENAL COLLECTING SYSTEM. 2. RIGHT PERCUTANEOUS NEPHROSTOMY TUBE PLACEMENT. COMPARISON:  None Available. ANESTHESIA/SEDATION: Moderate (conscious) sedation was employed during this procedure. A total of Versed 2.0 mg and Fentanyl 100 mcg was administered intravenously. Moderate Sedation Time: 19 minutes. The patient's level of consciousness and vital signs were monitored continuously by radiology nursing throughout the procedure under my direct supervision. CONTRAST:  10 mL Omnipaque 300 MEDICATIONS: None FLUOROSCOPY TIME:  2 minutes and 30 seconds.  46.0 mGy. PROCEDURE: The procedure, risks, benefits, and alternatives were explained to the patient. Questions regarding the procedure were encouraged and answered. The patient understands and consents to the procedure. A time-out was performed prior to initiating the procedure. The right flank region was prepped with Betadine in a sterile fashion, and a sterile drape was applied covering the operative field. A sterile gown and sterile gloves were used for the procedure. Local anesthesia was provided with 1% Lidocaine. Ultrasound was used to localize the right kidney. Under direct ultrasound guidance, a 21 gauge needle was advanced into the renal collecting system. Ultrasound image documentation was performed. Aspiration of urine sample was performed followed by contrast injection. A transitional dilator was advanced over a guidewire. Percutaneous tract dilatation was then performed over the guidewire. A 10-French percutaneous nephrostomy tube was  then advanced and formed in the collecting system. A urine sample was aspirated from the nephrostomy tube and sent for culture analysis. Catheter position was confirmed by fluoroscopy after contrast injection. The catheter was secured at the skin with a Prolene retention suture and Stat-Lock device. A gravity bag was placed. COMPLICATIONS: None. FINDINGS: Ultrasound demonstrates moderate hydronephrosis of the right kidney. Fluoroscopy demonstrates retained excreted contrast material within the collecting system and an obstructing calculus at the level of the renal pelvis and UPJ. After access of the collecting system via the lower pole/interpolar region, a 10 French nephrostomy tube was placed and formed in the renal pelvis. There is return of blood tinged urine. A sample was sent for culture. The nephrostomy tube was attached to a gravity drainage bag. IMPRESSION: Right-sided percutaneous nephrostomy tube placement with placement of a 10 French catheter formed in the renal pelvis. There is return of blood tinged urine. The catheter will be left to gravity bag drainage. A urine sample was sent for culture analysis. Electronically Signed   By: Irish Lack M.D.   On: 07/07/2022 11:31   CT ABDOMEN PELVIS W CONTRAST  Result Date: 07/06/2022 CLINICAL DATA:  Abdominal pain, acute, nonlocalized, vomiting EXAM: CT ABDOMEN AND PELVIS WITH CONTRAST TECHNIQUE: Multidetector CT imaging of the abdomen and pelvis was performed using the standard protocol following bolus administration of intravenous contrast. RADIATION DOSE REDUCTION: This exam was performed according to the departmental dose-optimization program which includes automated exposure control, adjustment of the mA and/or kV according to patient size and/or use of iterative reconstruction technique. CONTRAST:  OMNIPAQUE IOHEXOL 300 MG/ML  SOLN COMPARISON:  08/20/2013 FINDINGS: Lower chest: No acute abnormality.  Small hiatal hernia. Hepatobiliary: Mild  hepatomegaly. No enhancing intrahepatic mass. No intra or extrahepatic biliary ductal dilation. Gallbladder unremarkable. Pancreas: Unremarkable Spleen: Normal in size without focal abnormality. Adrenals/Urinary Tract: The adrenal glands are unremarkable. The kidneys are normal in size and position. Moderate right hydronephrosis secondary to an obstructing 10 x 16 x 19 mm calculus within the right ureteropelvic junction. Moderate right perinephric stranding. Delayed right renal cortical enhancement. Superimposed bilateral nonobstructing nephrolithiasis is seen with calculi within the lower pole measuring up to 9 mm on the right and 10 mm on the left. No hydronephrosis on the left. No additional ureteral calculi. The bladder is unremarkable. Stomach/Bowel: Mild scattered colonic diverticulosis. The stomach, small bowel, and large bowel are otherwise unremarkable. Appendix normal. No evidence of obstruction or focal inflammation. No free intraperitoneal gas or fluid. Vascular/Lymphatic: Aortic atherosclerosis. No enlarged abdominal or pelvic lymph nodes. Reproductive: Uterus and bilateral adnexa are unremarkable. Other: No abdominal wall hernia or abnormality. No abdominopelvic ascites. Musculoskeletal: Degenerative changes are seen within the lumbar spine. No acute bone abnormality. No lytic or blastic bone lesion. IMPRESSION: 1. Obstructing 10 x 16 x 19 mm calculus within the right ureteropelvic junction resulting in moderate right hydronephrosis and delayed right renal cortical enhancement. Superimposed mild bilateral nonobstructing nephrolithiasis. Aortic Atherosclerosis (ICD10-I70.0). Electronically Signed   By: Helyn Numbers M.D.   On: 07/06/2022 22:08   DG Chest 1 View  Result Date: 07/06/2022 CLINICAL DATA:  Shortness of breath, nausea EXAM: CHEST  1 VIEW COMPARISON:  Chest radiograph 03/05/2018 FINDINGS: The cardiomediastinal silhouette is normal There is no focal consolidation or pulmonary edema. There  is no pleural effusion or pneumothorax There is no acute osseous abnormality. IMPRESSION: No radiographic evidence of acute cardiopulmonary process. Electronically Signed   By: Lesia Hausen M.D.   On: 07/06/2022 19:25     Subjective: Pt feels much better and back to baseline, tolerating diet, no complaints, eager to get home.  She requested to follow up with urology in [place] (lives in [place])  Discharge Exam: Vitals:   07/30/22 2204 07/31/22 0338  BP: 132/69 137/73  Pulse: 91 90  Resp: 19 18  Temp: 98.2 F (36.8 C) 99.1 F (37.3 C)  SpO2: 95% 96%   Vitals:   07/30/22 1347 07/30/22 1804 07/30/22 2204 07/31/22 0338  BP: 133/68 (!) 148/73 132/69 137/73  Pulse: 88 90 91 90  Resp: 20 17 19 18   Temp: 98.6 F (37 C) 99.6 F (37.6 C) 98.2 F (36.8 C) 99.1 F (37.3 C)  TempSrc: Oral Oral Oral Oral  SpO2: 97% 100% 95% 96%  Weight:      Height:       General: Pt is alert, awake, not in acute distress Cardiovascular: normal S1/S2 +, no rubs, no gallops Respiratory: CTA bilaterally, no wheezing, no rhonchi Abdominal: Soft, NT, ND, bowel sounds + Extremities: no edema, no cyanosis   The results of significant diagnostics from this hospitalization (including imaging, microbiology, ancillary and laboratory) are listed below for reference.     Microbiology: Recent Results (from the past 240 hour(s))  Blood Culture (routine x 2)     Status: None (Preliminary result)   Collection Time: 07/27/22 10:22 PM   Specimen: BLOOD  Result Value Ref Range Status   Specimen Description BLOOD BLOOD RIGHT HAND  Final   Special Requests   Final    BOTTLES DRAWN AEROBIC AND ANAEROBIC Blood Culture results may not be optimal due to an inadequate volume of blood received in culture  bottles   Culture   Final    NO GROWTH 3 DAYS Performed at [facility], [address], [place], [place] [address]    Report Status PENDING  Incomplete  Blood Culture (routine x 2)     Status: None (Preliminary  result)   Collection Time: 07/27/22 10:22 PM   Specimen: BLOOD  Result Value Ref Range Status   Specimen Description BLOOD BLOOD LEFT HAND  Final   Special Requests   Final    BOTTLES DRAWN AEROBIC AND ANAEROBIC Blood Culture adequate volume   Culture   Final    NO GROWTH 3 DAYS Performed at [facility], [address] [place], [place] [address]    Report Status PENDING  Incomplete  Resp panel by RT-PCR (RSV, Flu A&B, Covid) Anterior Nasal Swab     Status: None   Collection Time: 07/27/22 10:30 PM   Specimen: Anterior Nasal Swab  Result Value Ref Range Status   SARS Coronavirus 2 by RT PCR NEGATIVE NEGATIVE Final    Comment: (NOTE) SARS-CoV-2 target nucleic acids are NOT DETECTED.  The SARS-CoV-2 RNA is generally detectable in upper respiratory specimens during the acute phase of infection. The lowest concentration of SARS-CoV-2 viral copies this assay can detect is 138 copies/mL. A negative result does not preclude SARS-Cov-2 infection and should not be used as the sole basis for treatment or other patient management decisions. A negative result may occur with  improper specimen collection/handling, submission of specimen other than nasopharyngeal swab, presence of viral mutation(s) within the areas targeted by this assay, and inadequate number of viral copies(<138 copies/mL). A negative result must be combined with clinical observations, patient history, and epidemiological information. The expected result is Negative.  Fact Sheet for Patients:  BloggerCourse.com  Fact Sheet for Healthcare Providers:  SeriousBroker.it  This test is no t yet approved or cleared by the [place] FDA and  has been authorized for detection and/or diagnosis of SARS-CoV-2 by FDA under an Emergency Use Authorization (EUA). This EUA will remain  in effect (meaning this test can be used) for the duration of the COVID-19 declaration under  Section 564(b)(1) of the Act, 21 U.S.C.section 360bbb-3(b)(1), unless the authorization is terminated  or revoked sooner.       Influenza A by PCR NEGATIVE NEGATIVE Final   Influenza B by PCR NEGATIVE NEGATIVE Final    Comment: (NOTE) The Xpert Xpress SARS-CoV-2/FLU/RSV plus assay is intended as an aid in the diagnosis of influenza from Nasopharyngeal swab specimens and should not be used as a sole basis for treatment. Nasal washings and aspirates are unacceptable for Xpert Xpress SARS-CoV-2/FLU/RSV testing.  Fact Sheet for Patients: BloggerCourse.com  Fact Sheet for Healthcare Providers: SeriousBroker.it  This test is not yet approved or cleared by the [place] FDA and has been authorized for detection and/or diagnosis of SARS-CoV-2 by FDA under an Emergency Use Authorization (EUA). This EUA will remain in effect (meaning this test can be used) for the duration of the COVID-19 declaration under Section 564(b)(1) of the Act, 21 U.S.C. section 360bbb-3(b)(1), unless the authorization is terminated or revoked.     Resp Syncytial Virus by PCR NEGATIVE NEGATIVE Final    Comment: (NOTE) Fact Sheet for Patients: BloggerCourse.com  Fact Sheet for Healthcare Providers: SeriousBroker.it  This test is not yet approved or cleared by the [place] FDA and has been authorized for detection and/or diagnosis of SARS-CoV-2 by FDA under an Emergency Use Authorization (EUA). This EUA will remain in effect (  meaning this test can be used) for the duration of the COVID-19 declaration under Section 564(b)(1) of the Act, 21 U.S.C. section 360bbb-3(b)(1), unless the authorization is terminated or revoked.  Performed at [facility], [address], [place], [place] [address]   Urine Culture     Status: None   Collection Time: 07/28/22 12:05 AM   Specimen: Urine, Random  Result Value  Ref Range Status   Specimen Description   Final    URINE, RANDOM Performed at [facility], [address], [place], [place] [address]    Special Requests   Final    NONE Reflexed from [id number] Performed at [facility], [address] [place], [place] [address]    Culture   Final    NO GROWTH Performed at [facility], [address], [place], [place] [address]    Report Status 07/29/2022 FINAL  Final     Labs: BNP (last 3 results) No results for input(s): "BNP" in the last 8760 hours. Basic Metabolic Panel: Recent Labs  Lab 07/27/22 2222 07/28/22 0435 07/29/22 0348 07/30/22 0432  NA 133* 132* 136 136  K 4.6 3.7 3.0* 3.7  CL 98 97* 99 99  CO2 20* 23 28 23   GLUCOSE 305* 290* 42* 146*  BUN 18 14 18 13   CREATININE 1.16* 0.88 1.11* 1.00  CALCIUM 9.5 9.3 8.5* 8.5*  MG  --  1.3* 1.9  --    Liver Function Tests: Recent Labs  Lab 07/27/22 2222 07/28/22 0435 07/29/22 0348  AST 26 15 11*  ALT 19 15 12   ALKPHOS 74 75 53  BILITOT 0.9 0.7 0.6  PROT 8.6* 8.6* 6.9  ALBUMIN 3.7 3.5 2.8*   Recent Labs  Lab 07/27/22 2222  LIPASE 22   No results for input(s): "AMMONIA" in the last 168 hours. CBC: Recent Labs  Lab 07/27/22 2222 07/28/22 0435 07/30/22 0432  WBC 10.5 10.3 10.3  NEUTROABS  --  8.2*  --   HGB 13.3 12.5 10.5*  HCT 40.0 38.1 33.0*  MCV 92.8 93.6 96.5  PLT 312 202 324   Cardiac Enzymes: No results for input(s): "CKTOTAL", "CKMB", "CKMBINDEX", "TROPONINI" in the last 168 hours. BNP: Invalid input(s): "POCBNP" CBG: Recent Labs  Lab 07/30/22 1119 07/30/22 1612 07/30/22 2200 07/31/22 0333 07/31/22 0719  GLUCAP 213* 284* 157* 171* 183*   D-Dimer No results for input(s): "DDIMER" in the last 72 hours. Hgb A1c No results for input(s): "HGBA1C" in the last 72 hours. Lipid Profile No results for input(s): "CHOL", "HDL", "LDLCALC", "TRIG", "CHOLHDL", "LDLDIRECT" in the last 72 hours. Thyroid function studies No results for input(s): "TSH",  "T4TOTAL", "T3FREE", "THYROIDAB" in the last 72 hours.  Invalid input(s): "FREET3" Anemia work up No results for input(s): "VITAMINB12", "FOLATE", "FERRITIN", "TIBC", "IRON", "RETICCTPCT" in the last 72 hours. Urinalysis    Component Value Date/Time   COLORURINE AMBER (A) 07/28/2022 0005   APPEARANCEUR CLEAR 07/28/2022 0005   LABSPEC 1.023 07/28/2022 0005   PHURINE 6.0 07/28/2022 0005   GLUCOSEU >=500 (A) 07/28/2022 0005   HGBUR LARGE (A) 07/28/2022 0005   HGBUR moderate 12/01/2009 1614   BILIRUBINUR NEGATIVE 07/28/2022 0005   BILIRUBINUR negative 02/10/2022 1541   BILIRUBINUR neg 07/11/2017 1649   KETONESUR 20 (A) 07/28/2022 0005   PROTEINUR 100 (A) 07/28/2022 0005   UROBILINOGEN 0.2 02/10/2022 1541   UROBILINOGEN 0.2 08/20/2013 1457   NITRITE POSITIVE (A) 07/28/2022 0005   LEUKOCYTESUR LARGE (A) 07/28/2022 0005   Sepsis Labs Recent Labs  Lab 07/27/22 2222 07/28/22 0435 07/30/22 0432  WBC 10.5 10.3 10.3   Microbiology Recent Results (from the past 240 hour(s))  Blood Culture (routine x 2)     Status: None (Preliminary result)   Collection Time: 07/27/22 10:22 PM   Specimen: BLOOD  Result Value Ref Range Status   Specimen Description BLOOD BLOOD RIGHT HAND  Final   Special Requests   Final    BOTTLES DRAWN AEROBIC AND ANAEROBIC Blood Culture results may not be optimal due to an inadequate volume of blood received in culture bottles   Culture   Final    NO GROWTH 3 DAYS Performed at [facility], [address], [place], [place] [address]    Report Status PENDING  Incomplete  Blood Culture (routine x 2)     Status: None (Preliminary result)   Collection Time: 07/27/22 10:22 PM   Specimen: BLOOD  Result Value Ref Range Status   Specimen Description BLOOD BLOOD LEFT HAND  Final   Special Requests   Final    BOTTLES DRAWN AEROBIC AND ANAEROBIC Blood Culture adequate volume   Culture   Final    NO GROWTH 3 DAYS Performed at [facility], [address],  [place], [place] [address]    Report Status PENDING  Incomplete  Resp panel by RT-PCR (RSV, Flu A&B, Covid) Anterior Nasal Swab     Status: None   Collection Time: 07/27/22 10:30 PM   Specimen: Anterior Nasal Swab  Result Value Ref Range Status   SARS Coronavirus 2 by RT PCR NEGATIVE NEGATIVE Final    Comment: (NOTE) SARS-CoV-2 target nucleic acids are NOT DETECTED.  The SARS-CoV-2 RNA is generally detectable in upper respiratory specimens during the acute phase of infection. The lowest concentration of SARS-CoV-2 viral copies this assay can detect is 138 copies/mL. A negative result does not preclude SARS-Cov-2 infection and should not be used as the sole basis for treatment or other patient management decisions. A negative result may occur with  improper specimen collection/handling, submission of specimen other than nasopharyngeal swab, presence of viral mutation(s) within the areas targeted by this assay, and inadequate number of viral copies(<138 copies/mL). A negative result must be combined with clinical observations, patient history, and epidemiological information. The expected result is Negative.  Fact Sheet for Patients:  BloggerCourse.com  Fact Sheet for Healthcare Providers:  SeriousBroker.it  This test is no t yet approved or cleared by the [place] FDA and  has been authorized for detection and/or diagnosis of SARS-CoV-2 by FDA under an Emergency Use Authorization (EUA). This EUA will remain  in effect (meaning this test can be used) for the duration of the COVID-19 declaration under Section 564(b)(1) of the Act, 21 U.S.C.section 360bbb-3(b)(1), unless the authorization is terminated  or revoked sooner.       Influenza A by PCR NEGATIVE NEGATIVE Final   Influenza B by PCR NEGATIVE NEGATIVE Final    Comment: (NOTE) The Xpert Xpress SARS-CoV-2/FLU/RSV plus assay is intended as an aid in the diagnosis of influenza  from Nasopharyngeal swab specimens and should not be used as a sole basis for treatment. Nasal washings and aspirates are unacceptable for Xpert Xpress SARS-CoV-2/FLU/RSV testing.  Fact Sheet for Patients: BloggerCourse.com  Fact Sheet for Healthcare Providers: SeriousBroker.it  This test is not yet approved or cleared by the [place] FDA and has been authorized for detection and/or diagnosis of SARS-CoV-2 by FDA under an Emergency Use Authorization (EUA). This EUA will remain in effect (meaning this  test can be used) for the duration of the COVID-19 declaration under Section 564(b)(1) of the Act, 21 U.S.C. section 360bbb-3(b)(1), unless the authorization is terminated or revoked.     Resp Syncytial Virus by PCR NEGATIVE NEGATIVE Final    Comment: (NOTE) Fact Sheet for Patients: BloggerCourse.com  Fact Sheet for Healthcare Providers: SeriousBroker.it  This test is not yet approved or cleared by the [place] FDA and has been authorized for detection and/or diagnosis of SARS-CoV-2 by FDA under an Emergency Use Authorization (EUA). This EUA will remain in effect (meaning this test can be used) for the duration of the COVID-19 declaration under Section 564(b)(1) of the Act, 21 U.S.C. section 360bbb-3(b)(1), unless the authorization is terminated or revoked.  Performed at [facility], [address] [place], [place] [address]   Urine Culture     Status: None   Collection Time: 07/28/22 12:05 AM   Specimen: Urine, Random  Result Value Ref Range Status   Specimen Description   Final    URINE, RANDOM Performed at [facility], [address], [place], [place] [address]    Special Requests   Final    NONE Reflexed from [id number] Performed at [facility], [address], [place], [place] [address]    Culture   Final    NO GROWTH Performed at [facility], [address] [place], [place] [address]    Report Status 07/29/2022 FINAL  Final    Time coordinating discharge:  37 mins   SIGNED:  Standley Dakins, MD  [facility] 07/31/2022, 9:32 AM How to contact the [facility] Attending or Consulting provider 7A - 7P or covering provider during after hours 7P -7A, for this patient?  Check the care team in [facility] and look for a) attending/consulting [facility] provider listed and b) the [facility] team listed Log into [email or web address] and use 's universal password to access. If you do not have the password, please contact the hospital operator. Locate the [facility] provider you are looking for under [facility] and page to a number that you can be directly reached. If you still have difficulty reaching the provider, please page the [facility] (Director on Call) for the Hospitalists listed on amion for assistance.

## 2022-08-01 LAB — CULTURE, BLOOD (ROUTINE X 2)
Culture: NO GROWTH
Special Requests: ADEQUATE

## 2022-08-02 ENCOUNTER — Telehealth: Payer: Self-pay

## 2022-08-02 NOTE — Transitions of Care (Post Inpatient/ED Visit) (Signed)
   08/02/2022  Name: Caitlin Thompson MRN: [id number] DOB: 02-07-1961  Today's TOC FU Call Status: Today's TOC FU Call Status:: Unsuccessul Call (1st Attempt) Unsuccessful Call (1st Attempt) Date: 08/02/22  Attempted to reach the patient regarding the most recent Inpatient/ED visit.  Follow Up Plan: Additional outreach attempts will be made to reach the patient to complete the Transitions of Care (Post Inpatient/ED visit) call.   Jodelle Gross, RN, BSN, CCM Care Management Coordinator [facility]

## 2022-08-03 ENCOUNTER — Telehealth: Payer: Self-pay | Admitting: Family Medicine

## 2022-08-03 ENCOUNTER — Encounter: Payer: Self-pay | Admitting: Family Medicine

## 2022-08-03 ENCOUNTER — Telehealth (INDEPENDENT_AMBULATORY_CARE_PROVIDER_SITE_OTHER): Payer: BC Managed Care – PPO | Admitting: Family Medicine

## 2022-08-03 DIAGNOSIS — R300 Dysuria: Secondary | ICD-10-CM

## 2022-08-03 DIAGNOSIS — N390 Urinary tract infection, site not specified: Secondary | ICD-10-CM

## 2022-08-03 LAB — POCT URINALYSIS DIPSTICK
Bilirubin, UA: NEGATIVE
Glucose, UA: POSITIVE — AB
Ketones, UA: NEGATIVE
Nitrite, UA: NEGATIVE
Protein, UA: POSITIVE — AB
Spec Grav, UA: 1.02 (ref 1.010–1.025)
Urobilinogen, UA: 0.2 E.U./dL
pH, UA: 6 (ref 5.0–8.0)

## 2022-08-03 MED ORDER — NITROFURANTOIN MONOHYD MACRO 100 MG PO CAPS
100.0000 mg | ORAL_CAPSULE | Freq: Two times a day (BID) | ORAL | 0 refills | Status: AC
Start: 2022-08-03 — End: 2022-08-10

## 2022-08-03 MED ORDER — PHENAZOPYRIDINE HCL 95 MG PO TABS: 95.0000 mg | ORAL_TABLET | Freq: Three times a day (TID) | ORAL | 0 refills | Status: DC | PRN

## 2022-08-03 NOTE — Assessment & Plan Note (Signed)
Macrobid 100 mg x 7 days Urinalysis, and urine culture ordered- Awaiting results will follow up. May take OTC AZO for urinary pain relief and OTC tylenol for fever. Advise patient to follow up with urology appointment  Explained to increase oral fluid intake. Drink 8 glasses of water daily.  Follow up for worsening or persistent symptoms. Patient verbalizes understanding regarding plan of care and all questions answered.

## 2022-08-03 NOTE — Progress Notes (Signed)
Virtual Visit via Video Note  I connected with Caitlin Thompson on 08/03/22 at  9:20 AM EDT by a video enabled telemedicine application and verified that I am speaking with the correct person using two identifiers.  Patient Location: Home Provider Location: Office/Clinic  I discussed the limitations, risks, security, and privacy concerns of performing an evaluation and management service by video and the availability of in person appointments. I also discussed with the patient that there may be a patient responsible charge related to this service. The patient expressed understanding and agreed to proceed.  Subjective: PCP: Kerri Perches, MD  Chief Complaint  Patient presents with   Urinary Frequency    Patient complains of urinary incontinence, frequency and pain for over 7 days. Was in ICU and given several antibiotics with no improvement.    Urinary Tract Infection  This is a recurrent problem. Patient reports burning sensation every urination and  has been gradually worsening since onset. The quality of the pain is described as burning. The pain is at a severity of 10/10. The maximum temperature recorded  was 102 - 102.9 F. She is not sexually active. There is history of pyelonephritis. Associated symptoms include flank pain, frequency, hesitancy and urgency. She has tried increased fluids for the symptoms. The treatment provided no relief. Her past medical history is significant for kidney stones and recurrent UTIs.       ROS: Per HPI  Current Outpatient Medications:    nitrofurantoin, macrocrystal-monohydrate, (MACROBID) 100 MG capsule, Take 1 capsule (100 mg total) by mouth 2 (two) times daily for 7 days., Disp: 14 capsule, Rfl: 0   phenazopyridine (PYRIDIUM) 95 MG tablet, Take 1 tablet (95 mg total) by mouth 3 (three) times daily as needed for pain., Disp: 10 tablet, Rfl: 0   amLODipine (NORVASC) 10 MG tablet, Take 1 tablet (10 mg total) by mouth daily., Disp: 30  tablet, Rfl: 6   carvedilol (COREG) 6.25 MG tablet, Take 1 tablet (6.25 mg total) by mouth 2 (two) times daily with a meal., Disp: 60 tablet, Rfl: 1   cefdinir (OMNICEF) 300 MG capsule, Take 1 capsule (300 mg total) by mouth 2 (two) times daily for 2 days., Disp: 4 capsule, Rfl: 0   diphenhydrAMINE (BENADRYL) 25 mg capsule, Take 1 capsule (25 mg total) by mouth every 6 (six) hours as needed for itching or allergies., Disp: 30 capsule, Rfl: 0   glucose blood (ONETOUCH VERIO) test strip, USE 1 STRIP TO CHECK GLUCOSE THREE TIMES DAILY, Disp: 150 each, Rfl: 0   insulin NPH-regular Human (70-30) 100 UNIT/ML injection, Take 10 units in the morning with breakfast and take 10 units with supper, Disp: 10 mL, Rfl: 11   OneTouch Delica Lancets 33G MISC, Three times daily testing dx e11.65, Disp: 150 each, Rfl: 1   pantoprazole (PROTONIX) 40 MG tablet, Take 1 tablet (40 mg total) by mouth daily., Disp: 30 tablet, Rfl: 0   rosuvastatin (CRESTOR) 10 MG tablet, Take 1 tablet (10 mg total) by mouth daily., Disp: 90 tablet, Rfl: 3   sodium chloride flush 0.9 % SOLN injection, Flush nephrostomy drain once daily with 5 mL of saline, Disp: 300 mL, Rfl: 2   traMADol (ULTRAM) 50 MG tablet, Take 1-2 tablets (50-100 mg total) by mouth every 6 (six) hours as needed for moderate pain., Disp: 15 tablet, Rfl: 0  Observations/Objective: There were no vitals filed for this visit. Physical Exam Telehealth visit Patient is alert and no acute distress noted.  Assessment and Plan: Dysuria -     POCT urinalysis dipstick  Urinary tract infection without hematuria, site unspecified Assessment & Plan: Macrobid 100 mg x 7 days Urinalysis, and urine culture ordered- Awaiting results will follow up. May take OTC AZO for urinary pain relief and OTC tylenol for fever. Advise patient to follow up with urology appointment  Explained to increase oral fluid intake. Drink 8 glasses of water daily.  Follow up for worsening or persistent  symptoms. Patient verbalizes understanding regarding plan of care and all questions answered.    Orders: -     Nitrofurantoin Monohyd Macro; Take 1 capsule (100 mg total) by mouth 2 (two) times daily for 7 days.  Dispense: 14 capsule; Refill: 0 -     Urine Culture  Other orders -     Phenazopyridine HCl; Take 1 tablet (95 mg total) by mouth 3 (three) times daily as needed for pain.  Dispense: 10 tablet; Refill: 0    Follow Up Instructions: No follow-ups on file.   I discussed the assessment and treatment plan with the patient. The patient was provided an opportunity to ask questions, and all were answered. The patient agreed with the plan and demonstrated an understanding of the instructions.   The patient was advised to call back or seek an in-person evaluation if the symptoms worsen or if the condition fails to improve as anticipated.  The above assessment and management plan was discussed with the patient. The patient verbalized understanding of and has agreed to the management plan.   Cruzita Lederer Newman Nip, FNP

## 2022-08-03 NOTE — Telephone Encounter (Signed)
FYI Patient is leaving urine now if we have a no show or cancellation I will add her to provider schedule. Can a nurse run the urine?   (Teams message sent also)

## 2022-08-03 NOTE — Telephone Encounter (Signed)
Patient did video visit

## 2022-08-04 ENCOUNTER — Telehealth: Payer: Self-pay

## 2022-08-04 NOTE — Transitions of Care (Post Inpatient/ED Visit) (Signed)
08/04/2022  Name: Caitlin Thompson MRN: [id number] DOB: June 01, 1960  Today's TOC FU Call Status: Today's TOC FU Call Status:: Successful TOC FU Call Competed TOC FU Call Complete Date: 08/04/22  Transition Care Management Follow-up Telephone Call Date of Discharge: 07/31/22 Discharge [facility]: [facility] ([facility]) Type of Discharge: Inpatient Admission Primary Inpatient Discharge Diagnosis:: Sepsis How have you been since you were released from the hospital?: Better Any questions or concerns?: No  Items Reviewed: Did you receive and understand the discharge instructions provided?: Yes Medications obtained,verified, and reconciled?: Yes (Medications Reviewed) Any new allergies since your discharge?: No Dietary orders reviewed?: No Do you have support at home?: Yes People in Home: sibling(s) Name of Support/Comfort Primary Source: Caitlin Thompson  Medications Reviewed Today: Medications Reviewed Today     Reviewed by Jodelle Gross, RN (Case Manager) on 08/04/22 at 1100  Med List Status: <None>   Medication Order Taking? Sig Documenting Provider Last Dose Status Informant  amLODipine (NORVASC) 10 MG tablet [id number] Yes Take 1 tablet (10 mg total) by mouth daily. Kerri Perches, MD Taking Active Family Member  carvedilol (COREG) 6.25 MG tablet [id number] Yes Take 1 tablet (6.25 mg total) by mouth 2 (two) times daily with a meal. Kerri Perches, MD Taking Active Family Member  diphenhydrAMINE (BENADRYL) 25 mg capsule [id number] No Take 1 capsule (25 mg total) by mouth every 6 (six) hours as needed for itching or allergies. Burnadette Pop, MD Unknown Active Family Member  glucose blood [facility] VERIO) test strip [id number] Yes USE 1 STRIP TO CHECK GLUCOSE THREE TIMES DAILY Kerri Perches, MD Taking Active Family Member  insulin NPH-regular Human (70-30) 100 UNIT/ML injection [id number] Yes Take 10 units in the morning with breakfast and take 10 units with supper Cleora Fleet,  MD Taking Active   nitrofurantoin, macrocrystal-monohydrate, (MACROBID) 100 MG capsule [id number] Yes Take 1 capsule (100 mg total) by mouth 2 (two) times daily for 7 days. Del Nigel Berthold, FNP Taking Active   OneTouch Delica Lancets 33G [place] [id number] No Three times daily testing dx e11.65 Kerri Perches, MD Unknown Active Family Member  pantoprazole (PROTONIX) 40 MG tablet [id number] Yes Take 1 tablet (40 mg total) by mouth daily. Burnadette Pop, MD Taking Active Family Member  phenazopyridine (PYRIDIUM) 95 MG tablet [id number] Yes Take 1 tablet (95 mg total) by mouth 3 (three) times daily as needed for pain. Del Newman Nip, Tenna Child, FNP Taking Active   rosuvastatin (CRESTOR) 10 MG tablet [id number] Yes Take 1 tablet (10 mg total) by mouth daily. Kerri Perches, MD Taking Active Family Member           Med Note Wynelle Link   Fri Jul 28, 2022  8:59 AM)    sodium chloride flush 0.9 % SOLN injection [id number] Yes Flush nephrostomy drain once daily with 5 mL of saline Kennieth Francois, [place] Taking Active Family Member  traMADol Janean Sark) 50 MG tablet [id number] Yes Take 1-2 tablets (50-100 mg total) by mouth every 6 (six) hours as needed for moderate pain. Crist Fat, MD Taking Active Family Member            Home Care and Equipment/Supplies: Were Home Health Services Ordered?: No Any new equipment or medical supplies ordered?: No  Functional Questionnaire: Do you need assistance with bathing/showering or dressing?: No Do you need assistance with meal preparation?: No Do you need assistance with eating?: No Do you have difficulty maintaining continence: No Do you need assistance with getting  out of bed/getting out of a chair/moving?: No Do you have difficulty managing or taking your medications?: No  Follow up appointments reviewed: PCP Follow-up appointment confirmed?: Yes Date of PCP follow-up appointment?: 08/18/22 Follow-up Provider: Dr. Lodema Hong Specialist  [facility] Follow-up appointment confirmed?: Yes Date of Specialist follow-up appointment?: 09/11/22 Follow-Up Specialty Provider:: Dr. Ronne Binning Do you need transportation to your follow-up appointment?: No Do you understand care options if your condition(s) worsen?: Yes-patient verbalized understanding  SDOH Interventions Today    Flowsheet Row Most Recent Value  SDOH Interventions   Food Insecurity Interventions Intervention Not Indicated  Housing Interventions Intervention Not Indicated  Transportation Interventions Intervention Not Indicated  Utilities Interventions Intervention Not Indicated     Jodelle Gross, RN, BSN, CCM Care Management Coordinator [facility] Phone: [phone]/Fax: [phone]

## 2022-08-05 LAB — URINE CULTURE

## 2022-08-07 LAB — STONE ANALYSIS
Calcium Oxalate Monohydrate: 10 %
Uric Acid Calculi: 90 %
Weight Calculi: 92 mg

## 2022-08-11 ENCOUNTER — Telehealth: Payer: Self-pay

## 2022-08-11 NOTE — Telephone Encounter (Signed)
Patient called complaining of frequent urination and hematuria. Patient states that the bleeding had subsided to just being on the tissue when she wipes. Patient informed that the urinary frequency and hematuria is normal with a stent being in place. Patient states that she had a temp of 100.2 this morning before went to work and feels something is just not right. Patient advised to go to ER due to her history. Patient states that she will recheck her temperature and if it has not went down she will go to ER.  Patient is schedule to be seen in office on on 09/11/2022. Per your consult note on 07/28/22. The tether was removed from the stent and the stent was repositioned into the bladder and the stent was to remain in place for 14 days.   09/11/22 appointment moved to 08/14/22 for stent removal

## 2022-08-14 ENCOUNTER — Ambulatory Visit (INDEPENDENT_AMBULATORY_CARE_PROVIDER_SITE_OTHER): Payer: BC Managed Care – PPO | Admitting: Urology

## 2022-08-14 VITALS — BP 186/95 | HR 105

## 2022-08-14 DIAGNOSIS — N201 Calculus of ureter: Secondary | ICD-10-CM

## 2022-08-14 DIAGNOSIS — Z466 Encounter for fitting and adjustment of urinary device: Secondary | ICD-10-CM | POA: Diagnosis not present

## 2022-08-14 LAB — MICROSCOPIC EXAMINATION
RBC, Urine: >30 /hpf — AB (ref 0–2)
WBC, UA: >30 /hpf — AB (ref 0–5)

## 2022-08-14 LAB — URINALYSIS, ROUTINE W REFLEX MICROSCOPIC
Bilirubin, UA: NEGATIVE
Ketones, UA: NEGATIVE
Nitrite, UA: NEGATIVE
Specific Gravity, UA: 1.025 (ref 1.005–1.030)
Urobilinogen, Ur: 0.2 mg/dL (ref 0.2–1.0)
pH, UA: 6 (ref 5.0–7.5)

## 2022-08-14 MED ORDER — DOXYCYCLINE HYCLATE 100 MG PO CAPS: 100.0000 mg | ORAL_CAPSULE | Freq: Once | ORAL | 0 refills | Status: AC

## 2022-08-14 MED ORDER — MONISTAT 1 COMBO PACK 1200 & 2 MG & % VA KIT: 1.0000 | PACK | Freq: Once | VAGINAL | 0 refills | Status: AC

## 2022-08-15 ENCOUNTER — Encounter: Payer: Self-pay | Admitting: Urology

## 2022-08-15 NOTE — Patient Instructions (Signed)

## 2022-08-15 NOTE — Progress Notes (Signed)
   08/14/2022  CC: followup nephrolithiasis   HPI:Caitlin Thompson is a 61yo here for ureteral stent removal  Blood pressure (!) 186/95, pulse (!) 105, last menstrual period 02/22/2015. NED. A&Ox3.   No respiratory distress   Abd soft, NT, ND Normal external genitalia with patent urethral meatus  Cystoscopy Procedure Note  Patient identification was confirmed, informed consent was obtained, and patient was prepped using Betadine solution.  Lidocaine jelly was administered per urethral meatus.    Procedure: - Flexible cystoscope introduced, without any difficulty.   - Thorough search of the bladder revealed:    normal urethral meatus    normal urothelium    no stones    no ulcers     no tumors    no urethral polyps    no trabeculation  - Ureteral orifices were normal in position and appearance. Using a grasper the right ureteral stent was removed intact  Post-Procedure: - Patient tolerated the procedure well  Assessment/ Plan: Followup 6 weeks with renal US   No follow-ups on file.  Wilkie Aye, MD

## 2022-08-18 ENCOUNTER — Ambulatory Visit (INDEPENDENT_AMBULATORY_CARE_PROVIDER_SITE_OTHER): Payer: BC Managed Care – PPO | Admitting: Family Medicine

## 2022-08-18 ENCOUNTER — Encounter: Payer: Self-pay | Admitting: Family Medicine

## 2022-08-18 VITALS — BP 150/86 | HR 91 | Ht 64.0 in | Wt 183.0 lb

## 2022-08-18 DIAGNOSIS — E785 Hyperlipidemia, unspecified: Secondary | ICD-10-CM

## 2022-08-18 DIAGNOSIS — E1159 Type 2 diabetes mellitus with other circulatory complications: Secondary | ICD-10-CM | POA: Diagnosis not present

## 2022-08-18 DIAGNOSIS — I10 Essential (primary) hypertension: Secondary | ICD-10-CM

## 2022-08-18 DIAGNOSIS — R112 Nausea with vomiting, unspecified: Secondary | ICD-10-CM | POA: Diagnosis not present

## 2022-08-18 DIAGNOSIS — I447 Left bundle-branch block, unspecified: Secondary | ICD-10-CM

## 2022-08-18 MED ORDER — TIRZEPATIDE 5 MG/0.5ML ~~LOC~~ SOAJ: 5.0000 mg | SUBCUTANEOUS | 3 refills | Status: DC

## 2022-08-18 MED ORDER — AMLODIPINE BESYLATE 10 MG PO TABS
10.0000 mg | ORAL_TABLET | Freq: Every day | ORAL | 1 refills | Status: DC
Start: 2022-08-18 — End: 2023-09-25

## 2022-08-18 NOTE — Patient Instructions (Addendum)
F/U last week in August, call if you need me sooner, with medications  Need to take amlodipin e 10 mg every day and the other blood pressure medication thqat you take to get blood pressure controlled  Labs today cBC. Chem 7 and EGFr  Fasting lipid, cmp and EGFr and HBa1C to be drawn 8/21 before August appt   Thankful you are on the road to recovery Thanks for choosing [facility], we consider it a privelige to serve you.

## 2022-08-19 LAB — CBC
Hematocrit: 31.8 % — ABNORMAL LOW (ref 34.0–46.6)
Hemoglobin: 10.3 g/dL — ABNORMAL LOW (ref 11.1–15.9)
MCH: 30.7 pg (ref 26.6–33.0)
MCHC: 32.4 g/dL (ref 31.5–35.7)
MCV: 95 fL (ref 79–97)
Platelets: 261 10*3/uL (ref 150–450)
RBC: 3.35 x10E6/uL — ABNORMAL LOW (ref 3.77–5.28)
RDW: 13 % (ref 11.7–15.4)
WBC: 7.6 10*3/uL (ref 3.4–10.8)

## 2022-08-19 LAB — BMP8+EGFR
BUN/Creatinine Ratio: 14 (ref 9–23)
BUN: 12 mg/dL (ref 6–24)
CO2: 24 mmol/L (ref 20–29)
Calcium: 9.9 mg/dL (ref 8.7–10.2)
Chloride: 103 mmol/L (ref 96–106)
Creatinine, Ser: 0.83 mg/dL (ref 0.57–1.00)
Glucose: 103 mg/dL — ABNORMAL HIGH (ref 70–99)
Potassium: 3.9 mmol/L (ref 3.5–5.2)
Sodium: 143 mmol/L (ref 134–144)
eGFR: 81 mL/min/{1.73_m2} (ref >59)

## 2022-08-25 ENCOUNTER — Other Ambulatory Visit (HOSPITAL_COMMUNITY): Payer: BC Managed Care – PPO

## 2022-08-27 ENCOUNTER — Encounter: Payer: Self-pay | Admitting: Family Medicine

## 2022-08-27 NOTE — Assessment & Plan Note (Signed)
Caitlin Thompson is reminded of the importance of commitment to daily physical activity for 30 minutes or more, as able and the need to limit carbohydrate intake to 30 to 60 grams per meal to help with blood sugar control.  Updated lab needed at/ before next visit.   The need to take medication as prescribed, test blood sugar as directed, and to call between visits if there is a concern that blood sugar is uncontrolled is also discussed.   [name] is reminded of the importance of daily foot exam, annual eye examination, and good blood sugar, blood pressure and cholesterol control.     Latest Ref Rng & Units 08/18/2022    4:34 PM 07/30/2022    4:32 AM 07/29/2022    3:48 AM 07/28/2022    4:35 AM 07/27/2022   10:22 PM  Diabetic Labs  Creatinine 0.57 - 1.00 mg/dL 4.09  8.11  9.14  7.82  1.16       08/18/2022    3:39 PM 08/18/2022    3:38 PM 08/14/2022    8:41 AM 07/31/2022    3:38 AM 07/30/2022   10:04 PM 07/30/2022    6:04 PM 07/30/2022    1:47 PM  BP/Weight  Systolic BP 150 166 186 137 132 148 133  Diastolic BP 86 87 95 73 69 73 68  Wt. (Lbs)  183.04       BMI  31.42 kg/m2           Latest Ref Rng & Units 02/17/2022    3:00 PM 05/30/2019   12:00 AM  Foot/eye exam completion dates  Eye Exam No Retinopathy  Retinopathy      Foot Form Completion  Done      This result is from an external source.

## 2022-08-27 NOTE — Assessment & Plan Note (Signed)
DASH diet and commitment to daily physical activity for a minimum of 30 minutes discussed and encouraged, as a part of hypertension management. The importance of attaining a healthy weight is also discussed.     08/18/2022    3:39 PM 08/18/2022    3:38 PM 08/14/2022    8:41 AM 07/31/2022    3:38 AM 07/30/2022   10:04 PM 07/30/2022    6:04 PM 07/30/2022    1:47 PM  BP/Weight  Systolic BP 150 166 186 137 132 148 133  Diastolic BP 86 87 95 73 69 73 68  Wt. (Lbs)  183.04       BMI  31.42 kg/m2          Needs to take correct dose of amlodipie for BP control

## 2022-08-27 NOTE — Progress Notes (Signed)
Caitlin Thompson     MRN: [id number]      DOB: 11-17-1960  Chief Complaint  Patient presents with   Follow-up    Follow up     HPI Caitlin Thompson is here for follow up and re-evaluation of chronic medical conditions, medication management and review of any available recent lab and radiology data.  She was hospitalized twice in May due to sepsis from presumed uTI and  had her right ureteral stent removed on 08/14/2022 Denies polyuria, polydipsia, blurred vision , or hypoglycemic episodes.   ROS Denies recent fever or chills. Denies sinus pressure, nasal congestion, ear pain or sore throat. Denies chest congestion, productive cough or wheezing. Denies chest pains, palpitations and leg swelling Denies abdominal pain, nausea, vomiting,diarrhea or constipation.   Denies dysuria, frequency, hesitancy or incontinence. Denies joint pain, swelling and limitation in mobility. Denies headaches, seizures, numbness, or tingling. Denies depression, anxiety or insomnia. Denies skin break down or rash.   PE  BP (!) 150/86 (BP Location: Left Arm, Patient Position: Sitting, Cuff Size: Large)   Pulse 91   Ht 5\' 4"  (1.626 m)   Wt 183 lb 0.6 oz (83 kg)   LMP 02/22/2015   SpO2 93%   BMI 31.42 kg/m   Patient alert and oriented and in no cardiopulmonary distress.  HEENT: No facial asymmetry, EOMI,     Neck supple .  Chest: Clear to auscultation bilaterally.  CVS: S1, S2 no murmurs, no S3.Regular rate.  ABD: Soft non tender.   Ext: No edema  MS: Adequate ROM spine, shoulders, hips and knees.  Skin: Intact, no ulcerations or rash noted.  Psych: Good eye contact, normal affect. Memory intact not anxious or depressed appearing.  CNS: CN 2-12 intact, power,  normal throughout.no focal deficits noted.   Assessment & Plan  Essential hypertension DASH diet and commitment to daily physical activity for a minimum of 30 minutes discussed and encouraged, as a part of hypertension  management. The importance of attaining a healthy weight is also discussed.     08/18/2022    3:39 PM 08/18/2022    3:38 PM 08/14/2022    8:41 AM 07/31/2022    3:38 AM 07/30/2022   10:04 PM 07/30/2022    6:04 PM 07/30/2022    1:47 PM  BP/Weight  Systolic BP 150 166 186 137 132 148 133  Diastolic BP 86 87 95 73 69 73 68  Wt. (Lbs)  183.04       BMI  31.42 kg/m2          Needs to take correct dose of amlodipie for BP control  Hyperlipidemia LDL goal <100 Hyperlipidemia:Low fat diet discussed and encouraged.   Lipid Panel  Lab Results  Component Value Date   CHOL 240 (H) 05/19/2022   HDL 61 05/19/2022   LDLCALC 158 (H) 05/19/2022   TRIG 118 05/19/2022   CHOLHDL 3.9 05/19/2022     Not at goal Updated lab needed at/ before next visit.   Intractable nausea and vomiting resolved  Type 2 diabetes mellitus with vascular disease (HCC) Caitlin Thompson is reminded of the importance of commitment to daily physical activity for 30 minutes or more, as able and the need to limit carbohydrate intake to 30 to 60 grams per meal to help with blood sugar control.  Updated lab needed at/ before next visit.   The need to take medication as prescribed, test blood sugar as directed, and to call between visits if there is  a concern that blood sugar is uncontrolled is also discussed.   Caitlin Thompson is reminded of the importance of daily foot exam, annual eye examination, and good blood sugar, blood pressure and cholesterol control.     Latest Ref Rng & Units 08/18/2022    4:34 PM 07/30/2022    4:32 AM 07/29/2022    3:48 AM 07/28/2022    4:35 AM 07/27/2022   10:22 PM  Diabetic Labs  Creatinine 0.57 - 1.00 mg/dL 5.18  8.41  6.60  6.30  1.16       08/18/2022    3:39 PM 08/18/2022    3:38 PM 08/14/2022    8:41 AM 07/31/2022    3:38 AM 07/30/2022   10:04 PM 07/30/2022    6:04 PM 07/30/2022    1:47 PM  BP/Weight  Systolic BP 150 166 186 137 132 148 133  Diastolic BP 86 87 95 73 69 73 68  Wt. (Lbs)   183.04       BMI  31.42 kg/m2           Latest Ref Rng & Units 02/17/2022    3:00 PM 05/30/2019   12:00 AM  Foot/eye exam completion dates  Eye Exam No Retinopathy  Retinopathy      Foot Form Completion  Done      This result is from an external source.

## 2022-08-27 NOTE — Assessment & Plan Note (Signed)
Hyperlipidemia:Low fat diet discussed and encouraged.   Lipid Panel  Lab Results  Component Value Date   CHOL 240 (H) 05/19/2022   HDL 61 05/19/2022   LDLCALC 158 (H) 05/19/2022   TRIG 118 05/19/2022   CHOLHDL 3.9 05/19/2022     Not at goal Updated lab needed at/ before next visit.

## 2022-08-27 NOTE — Assessment & Plan Note (Signed)
resolved 

## 2022-09-11 ENCOUNTER — Ambulatory Visit: Payer: BC Managed Care – PPO | Admitting: Urology

## 2022-09-15 ENCOUNTER — Ambulatory Visit (HOSPITAL_COMMUNITY)
Admission: RE | Admit: 2022-09-15 | Discharge: 2022-09-15 | Disposition: A | Discharge status: Home / Self Care | Payer: BC Managed Care – PPO | Source: Ambulatory Visit | Attending: Urology | Admitting: Urology

## 2022-09-15 DIAGNOSIS — N201 Calculus of ureter: Secondary | ICD-10-CM | POA: Diagnosis not present

## 2022-09-15 DIAGNOSIS — N200 Calculus of kidney: Secondary | ICD-10-CM | POA: Diagnosis not present

## 2022-09-22 ENCOUNTER — Ambulatory Visit: Payer: BC Managed Care – PPO | Admitting: Family Medicine

## 2022-09-25 ENCOUNTER — Ambulatory Visit (INDEPENDENT_AMBULATORY_CARE_PROVIDER_SITE_OTHER): Payer: BC Managed Care – PPO | Admitting: Urology

## 2022-09-25 VITALS — BP 150/80 | HR 97 | Ht 64.0 in | Wt 183.0 lb

## 2022-09-25 DIAGNOSIS — N200 Calculus of kidney: Secondary | ICD-10-CM | POA: Diagnosis not present

## 2022-09-25 DIAGNOSIS — N201 Calculus of ureter: Secondary | ICD-10-CM

## 2022-09-25 NOTE — Progress Notes (Signed)
09/25/2022 3:55 PM   [name] 02-Apr-1960 [id number]  Referring provider: Kerri Perches, MD [address] [place],  [place] [address]  nephrolithiasis   HPI: Caitlin Thompson is a 62yo here for followup for nephrolithiasis. Renal US shows left renal calculi and no right renal calculi. She denies any flank pain. No worsening LUTS.    PMH: Past Medical History:  Diagnosis Date   Adenomatous colon polyp 06/26/2011   05/17/11 Colonoscopy Dr Allayne Butcher adenoma Next colonoscopy 05/2016    Complication of anesthesia    DERMATOMYCOSIS 05/23/2010   Qualifier: Diagnosis of  By: Lodema Hong MD, Margaret     Diabetes mellitus type II 03/06/2005   GERD (gastroesophageal reflux disease) 04/03/2011   Non-critical Schatzki's ring, HH on EGD 05/17/11 Dr Jena Gauss    Heart murmur    Hematuria    History of kidney stones    HYDRONEPHROSIS, LEFT 08/30/2009   Qualifier: Diagnosis of  By: Garnette Czech PA, Dawn     HYPERLIPIDEMIA 12/01/2009   Qualifier: Diagnosis of  By: Garnette Czech PA, Dawn     Hypertension    Obesity (BMI 30.0-34.9) 07/15/2011   Renal lithiasis 08/04/2009   Hospitalized a [facility] 3 days in June    Schatzki's ring    THYROID STIMULATING HORMONE, ABNORMAL 08/30/2009   Qualifier: Diagnosis of  By: Garnette Czech PA, Dawn     Tubular adenoma of colon 05/05/2011    Surgical History: Past Surgical History:  Procedure Laterality Date   COLONOSCOPY N/A 01/29/2019   Procedure: COLONOSCOPY;  Surgeon: Corbin Ade, MD;  Location: [facility];  Service: Endoscopy;  Laterality: N/A;  2:00   COLONOSCOPY W/ POLYPECTOMY  05/17/11   Rourk-Tubular adenoma colon, benign SB bx   CYSTOSCOPY/URETEROSCOPY/HOLMIUM LASER/STENT PLACEMENT Right 07/27/2022   Procedure: CYSTOSCOPY, RIGHT URETEROSCOPY, HOLMIUM LASER LITHOTRIPSY, RIGHT URETERAL STENT PLACEMENT, AND REMOVAL OF RIGHT NEPHROSTOMY TUBE;  Surgeon: Crist Fat, MD;  Location: [facility];  Service: Urology;  Laterality: Right;   90 MINUTES   ESOPHAGEAL DILATION  05/17/2011   Rourk-Hiatal hernia/Incomplete noncritical Schatzki's ring/HH   IR NEPHROSTOMY PLACEMENT RIGHT  07/07/2022   POLYPECTOMY  01/29/2019   Procedure: POLYPECTOMY;  Surgeon: Corbin Ade, MD;  Location: [facility];  Service: Endoscopy;;   WISDOM TOOTH EXTRACTION      Home Medications:  Allergies as of 09/25/2022       Reactions   Codeine Nausea And Vomiting   Fluconazole Other (See Comments)   Prolonged QT   Parlodel [bromocriptine Mesylate]    headache   Penicillins Hives   Sulfa Antibiotics Hives, Nausea And Vomiting, Other (See Comments)   Sulfonamide Derivatives Hives        Medication List        Accurate as of September 25, 2022  3:55 PM. If you have any questions, ask your nurse or doctor.          amLODipine 10 MG tablet Commonly known as: NORVASC Take 1 tablet (10 mg total) by mouth daily.   diphenhydrAMINE 25 mg capsule Commonly known as: BENADRYL Take 1 capsule (25 mg total) by mouth every 6 (six) hours as needed for itching or allergies.   doxycycline 100 MG capsule Commonly known as: VIBRAMYCIN Take 100 mg by mouth once.   insulin NPH-regular Human (70-30) 100 UNIT/ML injection Take 10 units in the morning with breakfast and take 10 units with supper   OneTouch Delica Lancets 33G Misc Three times daily testing dx e11.65  OneTouch Verio test strip Generic drug: glucose blood USE 1 STRIP TO CHECK GLUCOSE THREE TIMES DAILY   pantoprazole 40 MG tablet Commonly known as: PROTONIX Take 1 tablet (40 mg total) by mouth daily.   phenazopyridine 95 MG tablet Commonly known as: PYRIDIUM Take 1 tablet (95 mg total) by mouth 3 (three) times daily as needed for pain.   sodium chloride flush 0.9 % Soln injection Flush nephrostomy drain once daily with 5 mL of saline   tirzepatide 5 MG/0.5ML Pen Commonly known as: MOUNJARO Inject 5 mg into the skin once a week.   traMADol 50 MG tablet Commonly known as:  Ultram Take 1-2 tablets (50-100 mg total) by mouth every 6 (six) hours as needed for moderate pain.        Allergies:  Allergies  Allergen Reactions   Codeine Nausea And Vomiting   Fluconazole Other (See Comments)    Prolonged QT   Parlodel [Bromocriptine Mesylate]     headache   Penicillins Hives   Sulfa Antibiotics Hives, Nausea And Vomiting and Other (See Comments)   Sulfonamide Derivatives Hives    Family History: Family History  Problem Relation Age of Onset   Diabetes Mother    Hypertension Mother    Kidney failure Mother    Diabetes Father    Hypertension Father    Kidney failure Father    Diabetes Brother        x2   Crohn's disease Sister    Aneurysm Sister     Social History:  reports that she has never smoked. She has never used smokeless tobacco. She reports that she does not drink alcohol and does not use drugs.  ROS: All other review of systems were reviewed and are negative except what is noted above in HPI  Physical Exam: BP (!) 150/80   Pulse 97   Ht 5\' 4"  (1.626 m)   Wt 183 lb (83 kg)   LMP 02/22/2015   BMI 31.41 kg/m   Constitutional:  Alert and oriented, No acute distress. HEENT: [place] AT, moist mucus membranes.  Trachea midline, no masses. Cardiovascular: No clubbing, cyanosis, or edema. Respiratory: Normal respiratory effort, no increased work of breathing. GI: Abdomen is soft, nontender, nondistended, no abdominal masses GU: No CVA tenderness.  Lymph: No cervical or inguinal lymphadenopathy. Skin: No rashes, bruises or suspicious lesions. Neurologic: Grossly intact, no focal deficits, moving all 4 extremities. Psychiatric: Normal mood and affect.  Laboratory Data: Lab Results  Component Value Date   WBC 7.6 08/18/2022   HGB 10.3 (L) 08/18/2022   HCT 31.8 (L) 08/18/2022   MCV 95 08/18/2022   PLT 261 08/18/2022    Lab Results  Component Value Date   CREATININE 0.83 08/18/2022    No results found for: "PSA"  No results found  for: "TESTOSTERONE"  Lab Results  Component Value Date   HGBA1C 7.8 (H) 07/24/2022    Urinalysis    Component Value Date/Time   COLORURINE AMBER (A) 07/28/2022 0005   APPEARANCEUR Clear 08/14/2022 0904   LABSPEC 1.023 07/28/2022 0005   PHURINE 6.0 07/28/2022 0005   GLUCOSEU 1+ (A) 08/14/2022 0904   HGBUR LARGE (A) 07/28/2022 0005   HGBUR moderate 12/01/2009 1614   BILIRUBINUR Negative 08/14/2022 0904   KETONESUR 20 (A) 07/28/2022 0005   PROTEINUR 3+ (A) 08/14/2022 0904   PROTEINUR 100 (A) 07/28/2022 0005   UROBILINOGEN 0.2 08/03/2022 0945   UROBILINOGEN 0.2 08/20/2013 1457   NITRITE Negative 08/14/2022 0904   NITRITE POSITIVE (  A) 07/28/2022 0005   LEUKOCYTESUR 3+ (A) 08/14/2022 0904   LEUKOCYTESUR LARGE (A) 07/28/2022 0005    Lab Results  Component Value Date   LABMICR See below: 08/14/2022   WBCUA >30 (A) 08/14/2022   LABEPIT 0-10 08/14/2022   BACTERIA Few (A) 08/14/2022    Pertinent Imaging: Renal US 09/15/2022: Images reviewed and discussed with the patient  No results found for this or any previous visit.  No results found for this or any previous visit.  No results found for this or any previous visit.  No results found for this or any previous visit.  Results for orders placed during the hospital encounter of 09/15/22  Ultrasound renal complete  Narrative CLINICAL DATA:  Nephrolithiasis.  EXAM: RENAL / URINARY TRACT ULTRASOUND COMPLETE  COMPARISON:  CT abdomen pelvis 07/27/2022  FINDINGS: Right Kidney:  Renal measurements: 10.2 x 4.6 x 6.0 cm = volume: 146.8 mL. Echogenicity within normal limits. No mass or hydronephrosis visualized.  Left Kidney:  Renal measurements: 10.9 x 6.2 x 5.8 cm = volume: 202.8 mL. Echogenicity within normal limits. No mass or hydronephrosis visualized. Multiple shadowing stones are demonstrated within the inferior pole left kidney measuring 6 mm and 5 mm.  Bladder:  Appears normal for degree of bladder  distention.  Other:  None.  IMPRESSION: 1. No hydronephrosis. 2. Left nephrolithiasis.   Electronically Signed By: Annia Belt M.D. On: 09/15/2022 15:01  No valid procedures specified. No results found for this or any previous visit.  No results found for this or any previous visit.   Assessment & Plan:    1. Nephrolithiasis Followup 6 months with [facility] - Urinalysis, Routine w reflex microscopic   No follow-ups on file.  Wilkie Aye, MD  [facility] [place]

## 2022-09-26 LAB — URINALYSIS, ROUTINE W REFLEX MICROSCOPIC
Bilirubin, UA: NEGATIVE
Glucose, UA: NEGATIVE
Ketones, UA: NEGATIVE
Nitrite, UA: NEGATIVE
Specific Gravity, UA: 1.025 (ref 1.005–1.030)
Urobilinogen, Ur: 0.2 mg/dL (ref 0.2–1.0)
pH, UA: 6 (ref 5.0–7.5)

## 2022-09-26 LAB — MICROSCOPIC EXAMINATION: Epithelial Cells (non renal): >10 /hpf — AB (ref 0–10)

## 2022-10-03 ENCOUNTER — Encounter: Payer: Self-pay | Admitting: Urology

## 2022-10-03 NOTE — Patient Instructions (Signed)

## 2022-10-17 ENCOUNTER — Other Ambulatory Visit: Payer: Self-pay

## 2022-10-17 MED ORDER — ONETOUCH VERIO VI STRP: ORAL_STRIP | 0 refills | Status: DC

## 2022-12-04 ENCOUNTER — Encounter: Payer: Self-pay | Admitting: Family Medicine

## 2022-12-08 ENCOUNTER — Encounter: Payer: Self-pay | Admitting: Family Medicine

## 2022-12-08 ENCOUNTER — Ambulatory Visit: Payer: BC Managed Care – PPO | Admitting: Family Medicine

## 2022-12-08 ENCOUNTER — Telehealth: Payer: Self-pay | Admitting: Family Medicine

## 2022-12-08 VITALS — BP 132/82 | HR 94 | Ht 64.0 in | Wt 178.0 lb

## 2022-12-08 DIAGNOSIS — E559 Vitamin D deficiency, unspecified: Secondary | ICD-10-CM

## 2022-12-08 DIAGNOSIS — I10 Essential (primary) hypertension: Secondary | ICD-10-CM | POA: Diagnosis not present

## 2022-12-08 DIAGNOSIS — Z1231 Encounter for screening mammogram for malignant neoplasm of breast: Secondary | ICD-10-CM | POA: Diagnosis not present

## 2022-12-08 DIAGNOSIS — E1159 Type 2 diabetes mellitus with other circulatory complications: Secondary | ICD-10-CM | POA: Diagnosis not present

## 2022-12-08 DIAGNOSIS — R300 Dysuria: Secondary | ICD-10-CM | POA: Diagnosis not present

## 2022-12-08 DIAGNOSIS — E785 Hyperlipidemia, unspecified: Secondary | ICD-10-CM

## 2022-12-08 MED ORDER — TIRZEPATIDE 7.5 MG/0.5ML ~~LOC~~ SOAJ: 7.5000 mg | SUBCUTANEOUS | 0 refills | Status: DC

## 2022-12-08 NOTE — Patient Instructions (Addendum)
Annual exam in 12 weeks , call if you nee me sooner  Please schedule mammogram at checkout past due  Please schedule diabetic retinal screening in office at checkout   You are referred  to Dr Charise Killian for diabetic eye exam  Labs today, CBC, HBA1c, lipid, cmp and eGFr, TSH and vit D  Higher dose mounjaro starting this week, andyoulikely no longer need insulin  Thanks for choosing [facility], we consider it a privelige to serve you.

## 2022-12-08 NOTE — Progress Notes (Unsigned)
Caitlin Thompson     MRN: [id number]      DOB: [birth year]  Chief Complaint  Patient presents with   Follow-up    Follow up requesting urine be tested     HPI Ms. Losier is here for follow up and re-evaluation of chronic medical conditions, medication management and review of any available recent lab and radiology data.  Preventive health is updated, specifically  Cancer screening and Immunization.   Questions or concerns regarding consultations or procedures which the PT has had in the interim are  addressed. The PT denies any adverse reactions to current medications since the last visit.  There are no new concerns.  There are no specific complaints   ROS Denies recent fever or chills. Denies sinus pressure, nasal congestion, ear pain or sore throat. Denies chest congestion, productive cough or wheezing. Denies chest pains, palpitations and leg swelling Denies abdominal pain, nausea, vomiting,diarrhea or constipation.   Denies dysuria, frequency, hesitancy or incontinence. Denies joint pain, swelling and limitation in mobility. Denies headaches, seizures, numbness, or tingling. Denies depression, anxiety or insomnia. Denies skin break down or rash.   PE  BP 132/82 (BP Location: Right Arm, Patient Position: Sitting, Cuff Size: Large)   Pulse 94   Ht 5\' 4"  (1.626 m)   Wt 178 lb (80.7 kg)   LMP 02/22/2015   SpO2 96%   BMI 30.55 kg/m   Patient alert and oriented and in no cardiopulmonary distress.  HEENT: No facial asymmetry, EOMI,     Neck supple .  Chest: Clear to auscultation bilaterally.  CVS: S1, S2 no murmurs, no S3.Regular rate.  ABD: Soft non tender.   Ext: No edema  MS: Adequate ROM spine, shoulders, hips and knees.  Skin: Intact, no ulcerations or rash noted.  Psych: Good eye contact, normal affect. Memory intact not anxious or depressed appearing.  CNS: CN 2-12 intact, power,  normal throughout.no focal deficits noted.   Assessment & Plan  No  problem-specific Assessment & Plan notes found for this encounter.

## 2022-12-08 NOTE — Telephone Encounter (Signed)
Called to schedule mammogram per [facility] needs the order diagnostic mammogram along with ultrasound considering patient did not follow up. Patient would like a 4:00 pm appointment

## 2022-12-11 ENCOUNTER — Other Ambulatory Visit: Payer: Self-pay

## 2022-12-11 LAB — URINALYSIS
Glucose, UA: NEGATIVE
Ketones, UA: NEGATIVE
Nitrite, UA: NEGATIVE
Specific Gravity, UA: 1.025 (ref 1.005–1.030)
Urobilinogen, Ur: 0.2 mg/dL (ref 0.2–1.0)
pH, UA: 5.5 (ref 5.0–7.5)

## 2022-12-11 MED ORDER — ROSUVASTATIN CALCIUM 5 MG PO TABS: 5.0000 mg | ORAL_TABLET | Freq: Every day | ORAL | 3 refills | Status: DC

## 2022-12-11 MED ORDER — SPIRONOLACTONE 100 MG PO TABS: 100.0000 mg | ORAL_TABLET | Freq: Every day | ORAL | 5 refills | Status: DC

## 2022-12-11 NOTE — Telephone Encounter (Signed)
Added to medication list and refilled

## 2022-12-11 NOTE — Assessment & Plan Note (Signed)
Updated lab needed at/ before next visit.   

## 2022-12-11 NOTE — Assessment & Plan Note (Signed)
Abn CCUA, culture pending no antibiotic prescribed

## 2022-12-11 NOTE — Assessment & Plan Note (Addendum)
Hyperlipidemia:Low fat diet discussed and encouraged.   Lipid Panel  Lab Results  Component Value Date   CHOL 235 (H) 12/08/2022   HDL 67 12/08/2022   LDLCALC 150 (H) 12/08/2022   TRIG 104 12/08/2022   CHOLHDL 3.5 12/08/2022     Uncontrolled needs to commit to lower fat intake and statin, crestor 5 mg daily prescribed

## 2022-12-11 NOTE — Assessment & Plan Note (Signed)
Controlled, no change in medication DASH diet and commitment to daily physical activity for a minimum of 30 minutes discussed and encouraged, as a part of hypertension management. The importance of attaining a healthy weight is also discussed.     12/08/2022    2:44 PM 09/25/2022    3:38 PM 08/18/2022    3:39 PM 08/18/2022    3:38 PM 08/14/2022    8:41 AM 07/31/2022    3:38 AM 07/30/2022   10:04 PM  BP/Weight  Systolic BP 132 150 150 166 186 137 132  Diastolic BP 82 80 86 87 95 73 69  Wt. (Lbs) 178 183  183.04     BMI 30.55 kg/m2 31.41 kg/m2  31.42 kg/m2

## 2022-12-11 NOTE — Assessment & Plan Note (Signed)
Ms. Coye is reminded of the importance of commitment to daily physical activity for 30 minutes or more, as able and the need to limit carbohydrate intake to 30 to 60 grams per meal to help with blood sugar control.   The need to take medication as prescribed, test blood sugar as directed, and to call between visits if there is a concern that blood sugar is uncontrolled is also discussed.   Ms. Daloia is reminded of the importance of daily foot exam, annual eye examination, and good blood sugar, blood pressure and cholesterol control.     Latest Ref Rng & Units 12/08/2022    3:58 PM 08/18/2022    4:34 PM 07/30/2022    4:32 AM 07/29/2022    3:48 AM 07/28/2022    4:35 AM  Diabetic Labs  Chol 100 - 199 mg/dL 952       HDL >84 mg/dL 67       Calc LDL 0 - 99 mg/dL 132       Triglycerides 0 - 149 mg/dL 440       Creatinine 1.02 - 1.00 mg/dL 7.25  3.66  4.40  3.47  0.88       12/08/2022    2:44 PM 09/25/2022    3:38 PM 08/18/2022    3:39 PM 08/18/2022    3:38 PM 08/14/2022    8:41 AM 07/31/2022    3:38 AM 07/30/2022   10:04 PM  BP/Weight  Systolic BP 132 150 150 166 186 137 132  Diastolic BP 82 80 86 87 95 73 69  Wt. (Lbs) 178 183  183.04     BMI 30.55 kg/m2 31.41 kg/m2  31.42 kg/m2         Latest Ref Rng & Units 02/17/2022    3:00 PM 05/30/2019   12:00 AM  Foot/eye exam completion dates  Eye Exam No Retinopathy  Retinopathy      Foot Form Completion  Done      This result is from an external source.      Controlled, no change in medication but inc mounjaro dose and likely no longer needs insulin Refer for diabetic eye exam, and in house diabetic screen

## 2022-12-12 ENCOUNTER — Other Ambulatory Visit: Payer: Self-pay | Admitting: Family Medicine

## 2022-12-12 DIAGNOSIS — R928 Other abnormal and inconclusive findings on diagnostic imaging of breast: Secondary | ICD-10-CM

## 2022-12-12 LAB — LIPID PANEL
Chol/HDL Ratio: 3.5 {ratio} (ref 0.0–4.4)
Cholesterol, Total: 235 mg/dL — ABNORMAL HIGH (ref 100–199)
HDL: 67 mg/dL (ref >39)
LDL Chol Calc (NIH): 150 mg/dL — ABNORMAL HIGH (ref 0–99)
Triglycerides: 104 mg/dL (ref 0–149)
VLDL Cholesterol Cal: 18 mg/dL (ref 5–40)

## 2022-12-12 LAB — CMP14+EGFR
ALT: 11 [IU]/L (ref 0–32)
AST: 20 [IU]/L (ref 0–40)
Albumin: 4.6 g/dL (ref 3.8–4.9)
BUN/Creatinine Ratio: 16 (ref 9–23)
BUN: 19 mg/dL (ref 6–24)
Bilirubin Total: 0.4 mg/dL (ref 0.0–1.2)
CO2: 17 mmol/L — ABNORMAL LOW (ref 20–29)
Chloride: 103 mmol/L (ref 96–106)
Creatinine, Ser: 1.17 mg/dL — ABNORMAL HIGH (ref 0.57–1.00)
Globulin, Total: 3.8 g/dL (ref 1.5–4.5)
Glucose: 114 mg/dL — ABNORMAL HIGH (ref 70–99)
Potassium: 5 mmol/L (ref 3.5–5.2)
Sodium: 142 mmol/L (ref 134–144)
Total Protein: 8.4 g/dL (ref 6.0–8.5)
eGFR: 54 mL/min/{1.73_m2} — ABNORMAL LOW (ref >59)

## 2022-12-12 LAB — VITAMIN D 25 HYDROXY (VIT D DEFICIENCY, FRACTURES): Vit D, 25-Hydroxy: 10.2 ng/mL — ABNORMAL LOW (ref 30.0–100.0)

## 2022-12-12 LAB — TSH

## 2022-12-12 MED ORDER — VITAMIN D (ERGOCALCIFEROL) 1.25 MG (50000 UNIT) PO CAPS: 50000.0000 [IU] | ORAL_CAPSULE | ORAL | 8 refills | Status: AC

## 2022-12-12 NOTE — Telephone Encounter (Signed)
Called patient will schedule close to 4:00 pm.

## 2022-12-12 NOTE — Addendum Note (Signed)
Addended by: Kerri Perches on: 12/12/2022 08:07 AM   Modules accepted: Orders

## 2022-12-12 NOTE — Telephone Encounter (Signed)
Checked the last mammo results and ordered left diagnostic mammo with ultrasounds that was recommended. Ok to schedule

## 2022-12-13 ENCOUNTER — Other Ambulatory Visit: Payer: Self-pay

## 2022-12-13 DIAGNOSIS — E785 Hyperlipidemia, unspecified: Secondary | ICD-10-CM

## 2022-12-13 DIAGNOSIS — E1159 Type 2 diabetes mellitus with other circulatory complications: Secondary | ICD-10-CM

## 2022-12-13 DIAGNOSIS — I10 Essential (primary) hypertension: Secondary | ICD-10-CM

## 2022-12-13 LAB — URINE CULTURE

## 2022-12-13 MED ORDER — NITROFURANTOIN MONOHYD MACRO 100 MG PO CAPS: 100.0000 mg | ORAL_CAPSULE | Freq: Two times a day (BID) | ORAL | 0 refills | Status: DC

## 2022-12-13 NOTE — Addendum Note (Signed)
Addended by: Syliva Overman E on: 12/13/2022 02:08 PM   Modules accepted: Orders

## 2023-01-01 ENCOUNTER — Ambulatory Visit (INDEPENDENT_AMBULATORY_CARE_PROVIDER_SITE_OTHER): Payer: BC Managed Care – PPO

## 2023-01-01 DIAGNOSIS — E1159 Type 2 diabetes mellitus with other circulatory complications: Secondary | ICD-10-CM

## 2023-01-01 NOTE — Progress Notes (Signed)
Caitlin Thompson arrived 01/01/2023 and has given verbal consent to obtain images and complete their overdue diabetic retinal screening.  The images have been sent to an ophthalmologist or optometrist for review and interpretation.  Results will be sent back to Kerri Perches, MD for review.  Patient has been informed they will be contacted when we receive the results via telephone or MyChart

## 2023-01-19 ENCOUNTER — Other Ambulatory Visit: Payer: Self-pay | Admitting: Family Medicine

## 2023-01-23 ENCOUNTER — Inpatient Hospital Stay (HOSPITAL_COMMUNITY): Admission: RE | Admit: 2023-01-23 | Payer: BC Managed Care – PPO | Source: Ambulatory Visit

## 2023-01-23 ENCOUNTER — Ambulatory Visit (HOSPITAL_COMMUNITY): Admission: RE | Admit: 2023-01-23 | Payer: BC Managed Care – PPO | Source: Ambulatory Visit

## 2023-01-23 ENCOUNTER — Ambulatory Visit (HOSPITAL_COMMUNITY): Payer: BC Managed Care – PPO | Attending: Family Medicine

## 2023-03-16 ENCOUNTER — Encounter: Payer: BC Managed Care – PPO | Admitting: Family Medicine

## 2023-03-28 ENCOUNTER — Ambulatory Visit (HOSPITAL_COMMUNITY)
Admission: RE | Admit: 2023-03-28 | Discharge: 2023-03-28 | Disposition: A | Discharge status: Home / Self Care | Payer: BC Managed Care – PPO | Source: Ambulatory Visit | Attending: Urology | Admitting: Urology

## 2023-03-28 ENCOUNTER — Ambulatory Visit (INDEPENDENT_AMBULATORY_CARE_PROVIDER_SITE_OTHER): Payer: BC Managed Care – PPO | Admitting: Urology

## 2023-03-28 VITALS — BP 160/88 | HR 89

## 2023-03-28 DIAGNOSIS — N200 Calculus of kidney: Secondary | ICD-10-CM

## 2023-03-28 DIAGNOSIS — N3000 Acute cystitis without hematuria: Secondary | ICD-10-CM

## 2023-03-28 DIAGNOSIS — Z87442 Personal history of urinary calculi: Secondary | ICD-10-CM | POA: Diagnosis not present

## 2023-03-28 DIAGNOSIS — I878 Other specified disorders of veins: Secondary | ICD-10-CM | POA: Diagnosis not present

## 2023-03-28 DIAGNOSIS — R82998 Other abnormal findings in urine: Secondary | ICD-10-CM

## 2023-03-28 DIAGNOSIS — N201 Calculus of ureter: Secondary | ICD-10-CM

## 2023-03-28 MED ORDER — NITROFURANTOIN MONOHYD MACRO 100 MG PO CAPS: 100.0000 mg | ORAL_CAPSULE | Freq: Two times a day (BID) | ORAL | 0 refills | Status: DC

## 2023-03-28 NOTE — Progress Notes (Signed)
03/28/2023 3:47 PM   Caitlin Thompson [birth year] [id number]  Referring provider: Kerri Perches, Thompson [address] [place],  [place] [address]  Followup nephrolithiasis   HPI: [name] is a 62yo here for followup for nephrolithiasis. UA today is concerning for infection. She was treated for a E coli UTI in 12/2022. Her urine is foul smelling. No stone events since last visit. No flank pain. No stone events since last visit. She denies any flank pain.    PMH: Past Medical History:  Diagnosis Date   Adenomatous colon polyp 06/26/2011   05/17/11 Colonoscopy Caitlin Thompson adenoma Next colonoscopy 05/2016    Complication of anesthesia    DERMATOMYCOSIS 05/23/2010   Qualifier: Diagnosis of  By: Caitlin Thompson     Diabetes mellitus type II 03/06/2005   GERD (gastroesophageal reflux disease) 04/03/2011   Non-critical Schatzki's ring, HH on EGD 05/17/11 Caitlin Caitlin Thompson    Heart murmur    Hematuria    History of kidney stones    HYDRONEPHROSIS, LEFT 08/30/2009   Qualifier: Diagnosis of  By: Caitlin Thompson     HYPERLIPIDEMIA 12/01/2009   Qualifier: Diagnosis of  By: Caitlin Thompson     Hypertension    Obesity (BMI 30.0-34.9) 07/15/2011   Renal lithiasis 08/04/2009   Hospitalized a Caitlin Thompson 3 days in June    Schatzki's ring    THYROID STIMULATING HORMONE, ABNORMAL 08/30/2009   Qualifier: Diagnosis of  By: Caitlin Thompson     Tubular adenoma of colon 05/05/2011    Surgical History: Past Surgical History:  Procedure Laterality Date   COLONOSCOPY N/A 01/29/2019   Procedure: COLONOSCOPY;  Surgeon: Caitlin Thompson;  Location: [facility];  Service: Endoscopy;  Laterality: N/A;  2:00   COLONOSCOPY W/ POLYPECTOMY  05/17/11   Rourk-Tubular adenoma colon, benign SB bx   CYSTOSCOPY/URETEROSCOPY/HOLMIUM LASER/STENT PLACEMENT Right 07/27/2022   Procedure: CYSTOSCOPY, RIGHT URETEROSCOPY, HOLMIUM LASER LITHOTRIPSY, RIGHT URETERAL STENT PLACEMENT, AND REMOVAL  OF RIGHT NEPHROSTOMY TUBE;  Surgeon: Caitlin Thompson;  Location: [facility];  Service: Urology;  Laterality: Right;  90 MINUTES   ESOPHAGEAL DILATION  05/17/2011   Rourk-Hiatal hernia/Incomplete noncritical Schatzki's ring/HH   IR NEPHROSTOMY PLACEMENT RIGHT  07/07/2022   POLYPECTOMY  01/29/2019   Procedure: POLYPECTOMY;  Surgeon: Caitlin Thompson;  Location: [facility];  Service: Endoscopy;;   WISDOM TOOTH EXTRACTION      Home Medications:  Allergies as of 03/28/2023       Reactions   Codeine Nausea And Vomiting   Fluconazole Other (See Comments)   Prolonged QT   Parlodel [bromocriptine Mesylate]    headache   Penicillins Hives   Sulfa Antibiotics Hives, Nausea And Vomiting, Other (See Comments)   Sulfonamide Derivatives Hives        Medication List        Accurate as of March 28, 2023  3:47 PM. If you have any questions, ask your nurse or doctor.          amLODipine 10 MG tablet Commonly known as: NORVASC Take 1 tablet (10 mg total) by mouth daily.   insulin NPH-regular Human (70-30) 100 UNIT/ML injection Take 10 units in the morning with breakfast and take 10 units with supper   Mounjaro 7.5 MG/0.5ML Pen Generic drug: tirzepatide INJECT 7.5 MG INTO THE SKIN ONCE A WEEK. DISCONTINUE 5 MG DOSE EFFECTIVE 12/08/2022   nitrofurantoin (macrocrystal-monohydrate) 100 MG capsule Commonly known as: Macrobid  Take 1 capsule (100 mg total) by mouth 2 (two) times daily.   OneTouch Delica Lancets 33G Misc Three times daily testing dx e11.65   OneTouch Verio test strip Generic drug: glucose blood USE 1 STRIP TO CHECK GLUCOSE THREE TIMES DAILY   rosuvastatin 5 MG tablet Commonly known as: Crestor Take 1 tablet (5 mg total) by mouth daily.   sodium chloride flush 0.9 % Soln injection Flush nephrostomy drain once daily with 5 mL of saline   spironolactone 100 MG tablet Commonly known as: Aldactone Take 1 tablet (100 mg total) by mouth daily.   Vitamin D  (Ergocalciferol) 1.25 MG (50000 UNIT) Caps capsule Commonly known as: DRISDOL Take 1 capsule (50,000 Units total) by mouth every 7 (seven) days.        Allergies:  Allergies  Allergen Reactions   Codeine Nausea And Vomiting   Fluconazole Other (See Comments)    Prolonged QT   Parlodel [Bromocriptine Mesylate]     headache   Penicillins Hives   Sulfa Antibiotics Hives, Nausea And Vomiting and Other (See Comments)   Sulfonamide Derivatives Hives    Family History: Family History  Problem Relation Age of Onset   Diabetes Mother    Hypertension Mother    Kidney failure Mother    Diabetes Father    Hypertension Father    Kidney failure Father    Diabetes Brother        x2   Crohn's disease Sister    Aneurysm Sister     Social History:  reports that she has never smoked. She has never used smokeless tobacco. She reports that she does not drink alcohol and does not use drugs.  ROS: All other review of systems were reviewed and are negative except what is noted above in HPI  Physical Exam: BP (!) 160/88   Pulse 89   LMP 02/22/2015   Constitutional:  Alert and oriented, No acute distress. HEENT: [place] AT, moist mucus membranes.  Trachea midline, no masses. Cardiovascular: No clubbing, cyanosis, or edema. Respiratory: Normal respiratory effort, no increased work of breathing. GI: Abdomen is soft, nontender, nondistended, no abdominal masses GU: No CVA tenderness.  Lymph: No cervical or inguinal lymphadenopathy. Skin: No rashes, bruises or suspicious lesions. Neurologic: Grossly intact, no focal deficits, moving all 4 extremities. Psychiatric: Normal mood and affect.  Laboratory Data: Lab Results  Component Value Date   WBC 7.6 08/18/2022   HGB 10.3 (L) 08/18/2022   HCT 31.8 (L) 08/18/2022   MCV 95 08/18/2022   PLT 261 08/18/2022    Lab Results  Component Value Date   CREATININE 1.17 (H) 12/08/2022    No results found for: "PSA"  No results found for:  "TESTOSTERONE"  Lab Results  Component Value Date   HGBA1C 7.8 (H) 07/24/2022    Urinalysis    Component Value Date/Time   COLORURINE AMBER (A) 07/28/2022 0005   APPEARANCEUR Hazy (A) 12/08/2022 1517   LABSPEC 1.023 07/28/2022 0005   PHURINE 6.0 07/28/2022 0005   GLUCOSEU Negative 12/08/2022 1517   HGBUR LARGE (A) 07/28/2022 0005   HGBUR moderate 12/01/2009 1614   BILIRUBINUR 1+ 12/08/2022 1517   KETONESUR 20 (A) 07/28/2022 0005   PROTEINUR 2+ 12/08/2022 1517   PROTEINUR 100 (A) 07/28/2022 0005   UROBILINOGEN 0.2 08/03/2022 0945   UROBILINOGEN 0.2 08/20/2013 1457   NITRITE Negative 12/08/2022 1517   NITRITE POSITIVE (A) 07/28/2022 0005   LEUKOCYTESUR 1+ 12/08/2022 1517   LEUKOCYTESUR LARGE (A) 07/28/2022 0005  Lab Results  Component Value Date   LABMICR See below: 09/25/2022   WBCUA 0-5 09/25/2022   LABEPIT >10 (A) 09/25/2022   BACTERIA Few (A) 09/25/2022    Pertinent Imaging: KUb today: Images reviewed and discussed with the patient  No results found for this or any previous visit.  No results found for this or any previous visit.  No results found for this or any previous visit.  No results found for this or any previous visit.  Results for orders placed during the hospital encounter of 09/15/22  Ultrasound renal complete  Narrative CLINICAL DATA:  Nephrolithiasis.  EXAM: RENAL / URINARY TRACT ULTRASOUND COMPLETE  COMPARISON:  CT abdomen pelvis 07/27/2022  FINDINGS: Right Kidney:  Renal measurements: 10.2 x 4.6 x 6.0 cm = volume: 146.8 mL. Echogenicity within normal limits. No mass or hydronephrosis visualized.  Left Kidney:  Renal measurements: 10.9 x 6.2 x 5.8 cm = volume: 202.8 mL. Echogenicity within normal limits. No mass or hydronephrosis visualized. Multiple shadowing stones are demonstrated within the inferior pole left kidney measuring 6 mm and 5 mm.  Bladder:  Appears normal for degree of bladder  distention.  Other:  None.  IMPRESSION: 1. No hydronephrosis. 2. Left nephrolithiasis.   Electronically Signed By: Annia Belt M.D. On: 09/15/2022 15:01  No results found for this or any previous visit.  No results found for this or any previous visit.  No results found for this or any previous visit.   Assessment & Plan:    1. Nephrolithiasis Followup 6 months with KUB  2. Acute cystitis -urine for culture -macrodantin 100mg  BID for 7 days Patient drop off UA in 2 weeks to ensure sterilization of urine.    No follow-ups on file.  Wilkie Aye, Thompson  [facility] [place]

## 2023-03-29 LAB — MICROSCOPIC EXAMINATION

## 2023-03-29 LAB — URINALYSIS, ROUTINE W REFLEX MICROSCOPIC
Bilirubin, UA: NEGATIVE
Glucose, UA: NEGATIVE
Ketones, UA: NEGATIVE
Nitrite, UA: POSITIVE — AB
RBC, UA: NEGATIVE
Specific Gravity, UA: 1.02 (ref 1.005–1.030)
Urobilinogen, Ur: 0.2 mg/dL (ref 0.2–1.0)
pH, UA: 6 (ref 5.0–7.5)

## 2023-04-03 ENCOUNTER — Other Ambulatory Visit: Payer: Self-pay | Admitting: Family Medicine

## 2023-04-03 ENCOUNTER — Encounter: Payer: Self-pay | Admitting: Urology

## 2023-04-03 NOTE — Patient Instructions (Signed)

## 2023-04-06 DIAGNOSIS — H2513 Age-related nuclear cataract, bilateral: Secondary | ICD-10-CM | POA: Diagnosis not present

## 2023-04-06 DIAGNOSIS — E113513 Type 2 diabetes mellitus with proliferative diabetic retinopathy with macular edema, bilateral: Secondary | ICD-10-CM | POA: Diagnosis not present

## 2023-04-06 DIAGNOSIS — E1136 Type 2 diabetes mellitus with diabetic cataract: Secondary | ICD-10-CM | POA: Diagnosis not present

## 2023-04-06 LAB — HM DIABETES EYE EXAM

## 2023-04-10 ENCOUNTER — Other Ambulatory Visit: Payer: Self-pay

## 2023-04-10 MED ORDER — MOUNJARO 7.5 MG/0.5ML ~~LOC~~ SOAJ: 7.5000 mg | SUBCUTANEOUS | 0 refills | Status: DC

## 2023-04-13 ENCOUNTER — Other Ambulatory Visit: Payer: BC Managed Care – PPO

## 2023-04-13 ENCOUNTER — Other Ambulatory Visit: Payer: Self-pay

## 2023-04-13 DIAGNOSIS — N3000 Acute cystitis without hematuria: Secondary | ICD-10-CM

## 2023-04-13 LAB — URINALYSIS, ROUTINE W REFLEX MICROSCOPIC
Bilirubin, UA: NEGATIVE
Glucose, UA: NEGATIVE
Ketones, UA: NEGATIVE
Nitrite, UA: NEGATIVE
Protein,UA: NEGATIVE
Specific Gravity, UA: 1.01 (ref 1.005–1.030)
Urobilinogen, Ur: 0.2 mg/dL (ref 0.2–1.0)
pH, UA: 6 (ref 5.0–7.5)

## 2023-04-13 LAB — MICROSCOPIC EXAMINATION: WBC, UA: >30 /[HPF] — AB (ref 0–5)

## 2023-04-16 LAB — URINE CULTURE

## 2023-04-19 DIAGNOSIS — H2513 Age-related nuclear cataract, bilateral: Secondary | ICD-10-CM | POA: Diagnosis not present

## 2023-04-19 DIAGNOSIS — E113513 Type 2 diabetes mellitus with proliferative diabetic retinopathy with macular edema, bilateral: Secondary | ICD-10-CM | POA: Diagnosis not present

## 2023-04-24 ENCOUNTER — Telehealth: Payer: Self-pay

## 2023-04-24 ENCOUNTER — Other Ambulatory Visit: Payer: Self-pay

## 2023-04-24 DIAGNOSIS — N3000 Acute cystitis without hematuria: Secondary | ICD-10-CM

## 2023-04-24 MED ORDER — NITROFURANTOIN MONOHYD MACRO 100 MG PO CAPS
100.0000 mg | ORAL_CAPSULE | Freq: Two times a day (BID) | ORAL | 0 refills | Status: DC
Start: 2023-04-24 — End: 2023-05-09

## 2023-04-24 NOTE — Telephone Encounter (Signed)
 Rx sent. Patient called and made aware.

## 2023-04-24 NOTE — Telephone Encounter (Signed)
-----   Message from Wilkie Aye sent at 04/24/2023  9:49 AM EST ----- Macrobid 100mg  BID for 7 days ----- Message ----- From: Nell Range Lab Results In Sent: 04/16/2023   3:35 PM EST To: Malen Gauze, MD

## 2023-04-25 ENCOUNTER — Other Ambulatory Visit: Payer: Self-pay

## 2023-04-25 DIAGNOSIS — N3000 Acute cystitis without hematuria: Secondary | ICD-10-CM

## 2023-04-26 ENCOUNTER — Ambulatory Visit (INDEPENDENT_AMBULATORY_CARE_PROVIDER_SITE_OTHER): Payer: BC Managed Care – PPO | Admitting: Family Medicine

## 2023-04-26 ENCOUNTER — Encounter: Payer: Self-pay | Admitting: Family Medicine

## 2023-04-26 VITALS — BP 166/91 | HR 88 | Ht 64.0 in | Wt 182.1 lb

## 2023-04-26 DIAGNOSIS — Z0000 Encounter for general adult medical examination without abnormal findings: Secondary | ICD-10-CM | POA: Diagnosis not present

## 2023-04-26 DIAGNOSIS — E1159 Type 2 diabetes mellitus with other circulatory complications: Secondary | ICD-10-CM | POA: Diagnosis not present

## 2023-04-26 DIAGNOSIS — I10 Essential (primary) hypertension: Secondary | ICD-10-CM | POA: Diagnosis not present

## 2023-04-26 DIAGNOSIS — E785 Hyperlipidemia, unspecified: Secondary | ICD-10-CM

## 2023-04-26 NOTE — Patient Instructions (Addendum)
 Nurse BP check March 14  MD follow up in 6 to 8 weeks. Foot   exam  nex t visit  I will send message re labs  It is important that you exercise regularly at least 30 minutes 5 times a week. If you develop chest pain, have severe difficulty breathing, or feel very tired, stop exercising immediately and seek medical attention    Thanks for choosing [facility], we consider it a privelige to serve you.   Covid vaccine needed pls get at your pharmacy  Thanks for choosing [facility], we consider it a privelige to serve you.    Marland Kitchen

## 2023-04-27 ENCOUNTER — Encounter: Payer: Self-pay | Admitting: Family Medicine

## 2023-04-27 LAB — LIPID PANEL
Chol/HDL Ratio: 4.4 {ratio} (ref 0.0–4.4)
Cholesterol, Total: 218 mg/dL — ABNORMAL HIGH (ref 100–199)
HDL: 49 mg/dL (ref >39)
LDL Chol Calc (NIH): 127 mg/dL — ABNORMAL HIGH (ref 0–99)
Triglycerides: 237 mg/dL — ABNORMAL HIGH (ref 0–149)
VLDL Cholesterol Cal: 42 mg/dL — ABNORMAL HIGH (ref 5–40)

## 2023-04-27 LAB — CMP14+EGFR
ALT: 15 [IU]/L (ref 0–32)
AST: 26 [IU]/L (ref 0–40)
Albumin: 4.2 g/dL (ref 3.8–4.9)
Alkaline Phosphatase: 80 [IU]/L (ref 44–121)
BUN/Creatinine Ratio: 18 (ref 12–28)
BUN: 15 mg/dL (ref 8–27)
Bilirubin Total: 0.2 mg/dL (ref 0.0–1.2)
CO2: 19 mmol/L — ABNORMAL LOW (ref 20–29)
Calcium: 9.7 mg/dL (ref 8.7–10.3)
Chloride: 102 mmol/L (ref 96–106)
Creatinine, Ser: 0.83 mg/dL (ref 0.57–1.00)
Globulin, Total: 3.6 g/dL (ref 1.5–4.5)
Glucose: 131 mg/dL — ABNORMAL HIGH (ref 70–99)
Potassium: 4.6 mmol/L (ref 3.5–5.2)
Sodium: 144 mmol/L (ref 134–144)
Total Protein: 7.8 g/dL (ref 6.0–8.5)
eGFR: 81 mL/min/{1.73_m2} (ref >59)

## 2023-04-27 LAB — HEMOGLOBIN A1C
Est. average glucose Bld gHb Est-mCnc: 140 mg/dL
Hgb A1c MFr Bld: 6.5 % — ABNORMAL HIGH (ref 4.8–5.6)

## 2023-04-29 ENCOUNTER — Encounter: Payer: Self-pay | Admitting: Family Medicine

## 2023-04-29 ENCOUNTER — Other Ambulatory Visit: Payer: Self-pay | Admitting: Family Medicine

## 2023-04-29 DIAGNOSIS — Z0000 Encounter for general adult medical examination without abnormal findings: Secondary | ICD-10-CM | POA: Insufficient documentation

## 2023-04-29 MED ORDER — ROSUVASTATIN CALCIUM 10 MG PO TABS
10.0000 mg | ORAL_TABLET | Freq: Every day | ORAL | 4 refills | Status: AC
Start: 2023-05-01 — End: ?

## 2023-04-29 MED ORDER — TIRZEPATIDE 10 MG/0.5ML ~~LOC~~ SOAJ: 10.0000 mg | SUBCUTANEOUS | 0 refills | Status: DC

## 2023-04-29 NOTE — Assessment & Plan Note (Signed)
Annual exam as documented. Counseling done  re healthy lifestyle involving commitment to 150 minutes exercise per week, heart healthy diet, and attaining healthy weight.The importance of adequate sleep also discussed. Changes in health habits are decided on by the patient with goals and time frames  set for achieving them. Immunization and cancer screening needs are specifically addressed at this visit. 

## 2023-04-29 NOTE — Progress Notes (Signed)
 Dose increase mounjaro when next due to fill

## 2023-04-29 NOTE — Progress Notes (Signed)
    Caitlin Thompson     MRN: [id number]      DOB: 01-06-1961  Chief Complaint  Patient presents with   Annual Exam    CPE     HPI: Patient is in for annual physical exam. Immunization is reviewed , and  updated if needed.   PE: BP (!) 166/91 (BP Location: Left Arm, Patient Position: Sitting, Cuff Size: Large)   Pulse 88   Ht 5\' 4"  (1.626 m)   Wt 182 lb 1.3 oz (82.6 kg)   LMP 02/22/2015   SpO2 93%   BMI 31.25 kg/m   Pleasant  female, alert and oriented x 3, in no cardio-pulmonary distress. Afebrile. HEENT No facial trauma or asymetry. Sinuses non tender.  Extra occullar muscles intact.. External ears normal, . Neck: supple, no adenopathy,JVD or thyromegaly.No bruits.  Chest: Clear to ascultation bilaterally.No crackles or wheezes. Non tender to palpation   Cardiovascular system; Heart sounds normal,  S1 and  S2 ,no S3.  No murmur, or thrill. Apical beat not displaced Peripheral pulses normal.  Abdomen: Soft, non tender, no organomegaly or masses. No bruits. Bowel sounds normal. No guarding, tenderness or rebound.   Musculoskeletal exam: Full ROM of spine, hips , shoulders and knees. No deformity ,swelling or crepitus noted. No muscle wasting or atrophy.   Neurologic: Cranial nerves 2 to 12 intact. Power, tone ,sensation and reflexes normal throughout. No disturbance in gait. No tremor.  Skin: Intact, no ulceration, erythema , scaling or rash noted. Pigmentation normal throughout  Psych; Normal mood and affect. Judgement and concentration normal   Assessment & Plan:  Encounter for annual physical exam Annual exam as documented. Counseling done  re healthy lifestyle involving commitment to 150 minutes exercise per week, heart healthy diet, and attaining healthy weight.The importance of adequate sleep also discussed. Changes in health habits are decided on by the patient with goals and time frames  set for achieving them. Immunization and cancer  screening needs are specifically addressed at this visit.   Hyperlipidemia LDL goal <100 Hyperlipidemia:Low fat diet discussed and encouraged.   Lipid Panel  Lab Results  Component Value Date   CHOL 218 (H) 04/26/2023   HDL 49 04/26/2023   LDLCALC 127 (H) 04/26/2023   TRIG 237 (H) 04/26/2023   CHOLHDL 4.4 04/26/2023     Needs increased dose of crestor and reduce fat intake

## 2023-04-29 NOTE — Assessment & Plan Note (Signed)
 Hyperlipidemia:Low fat diet discussed and encouraged.   Lipid Panel  Lab Results  Component Value Date   CHOL 218 (H) 04/26/2023   HDL 49 04/26/2023   LDLCALC 127 (H) 04/26/2023   TRIG 237 (H) 04/26/2023   CHOLHDL 4.4 04/26/2023     Needs increased dose of crestor and reduce fat intake

## 2023-05-02 DIAGNOSIS — Z794 Long term (current) use of insulin: Secondary | ICD-10-CM | POA: Diagnosis not present

## 2023-05-02 DIAGNOSIS — Z7985 Long-term (current) use of injectable non-insulin antidiabetic drugs: Secondary | ICD-10-CM | POA: Diagnosis not present

## 2023-05-02 DIAGNOSIS — E1136 Type 2 diabetes mellitus with diabetic cataract: Secondary | ICD-10-CM | POA: Diagnosis not present

## 2023-05-02 DIAGNOSIS — H2513 Age-related nuclear cataract, bilateral: Secondary | ICD-10-CM | POA: Diagnosis not present

## 2023-05-02 DIAGNOSIS — E113513 Type 2 diabetes mellitus with proliferative diabetic retinopathy with macular edema, bilateral: Secondary | ICD-10-CM | POA: Diagnosis not present

## 2023-05-07 ENCOUNTER — Telehealth: Payer: Self-pay

## 2023-05-07 NOTE — Telephone Encounter (Signed)
 Hematuria Patient called with hematuria: happened a week ago stopped then came back a week later. Symptoms: no problems with bladder Color: Bloody Frequency: persistent Symptom onset:2-3 days Blood thinners:no Name of medication:  Associated Signs/Symptoms:  Pain no Fever and chills:no temperature is  Nausea, vomiting:nausea and no vomiting Burning with urination: No Any Recent Urologic Surgeries or Procedures: noWhat  Any History Of: Stones: yes (25 years ago and back in September) Bladder tumor: no Recurrent UTI's: yes  Plan: keep scheduled appointment on 03/05  Advice given: keep appointment and if you get a fever or other symptoms

## 2023-05-09 ENCOUNTER — Other Ambulatory Visit: Payer: BC Managed Care – PPO

## 2023-05-09 DIAGNOSIS — N200 Calculus of kidney: Secondary | ICD-10-CM

## 2023-05-09 MED ORDER — NITROFURANTOIN MONOHYD MACRO 100 MG PO CAPS: 100.0000 mg | ORAL_CAPSULE | Freq: Two times a day (BID) | ORAL | 0 refills | Status: DC

## 2023-05-10 LAB — URINALYSIS, ROUTINE W REFLEX MICROSCOPIC
Bilirubin, UA: NEGATIVE
Ketones, UA: NEGATIVE
Nitrite, UA: POSITIVE — AB
Specific Gravity, UA: 1.025 (ref 1.005–1.030)
Urobilinogen, Ur: 0.2 mg/dL (ref 0.2–1.0)
pH, UA: 6 (ref 5.0–7.5)

## 2023-05-10 LAB — MICROSCOPIC EXAMINATION

## 2023-05-12 LAB — URINE CULTURE

## 2023-05-15 ENCOUNTER — Ambulatory Visit (HOSPITAL_COMMUNITY)
Admission: RE | Admit: 2023-05-15 | Discharge: 2023-05-15 | Disposition: A | Discharge status: Home / Self Care | Payer: BC Managed Care – PPO | Source: Ambulatory Visit | Attending: Family Medicine | Admitting: Family Medicine

## 2023-05-15 ENCOUNTER — Ambulatory Visit (HOSPITAL_COMMUNITY): Admission: RE | Admit: 2023-05-15 | Payer: BC Managed Care – PPO | Source: Ambulatory Visit

## 2023-05-15 ENCOUNTER — Encounter (HOSPITAL_COMMUNITY): Payer: Self-pay

## 2023-05-15 DIAGNOSIS — R928 Other abnormal and inconclusive findings on diagnostic imaging of breast: Secondary | ICD-10-CM | POA: Insufficient documentation

## 2023-05-15 DIAGNOSIS — R921 Mammographic calcification found on diagnostic imaging of breast: Secondary | ICD-10-CM | POA: Diagnosis not present

## 2023-05-15 DIAGNOSIS — R92323 Mammographic fibroglandular density, bilateral breasts: Secondary | ICD-10-CM | POA: Diagnosis not present

## 2023-06-08 ENCOUNTER — Encounter: Payer: Self-pay | Admitting: Family Medicine

## 2023-06-08 ENCOUNTER — Ambulatory Visit: Payer: BC Managed Care – PPO | Admitting: Family Medicine

## 2023-06-08 VITALS — BP 142/80 | HR 96 | Ht 64.0 in | Wt 186.0 lb

## 2023-06-08 DIAGNOSIS — E785 Hyperlipidemia, unspecified: Secondary | ICD-10-CM | POA: Diagnosis not present

## 2023-06-08 DIAGNOSIS — I10 Essential (primary) hypertension: Secondary | ICD-10-CM | POA: Diagnosis not present

## 2023-06-08 DIAGNOSIS — E1159 Type 2 diabetes mellitus with other circulatory complications: Secondary | ICD-10-CM

## 2023-06-08 DIAGNOSIS — E66811 Obesity, class 1: Secondary | ICD-10-CM

## 2023-06-08 NOTE — Patient Instructions (Addendum)
 F/U in mid July , call if you need me sooner  Urine  ACR today  Good foot exam  Blood pressure improved , but still needs o be consistently 130/80 or less  Fasting labs to be drawn 3 to 7 days before next appt,  Nurse pls order meter with test strips for twice daily testing   It is important that you exercise regularly at least 30 minutes 5 times a week. If you develop chest pain, have severe difficulty breathing, or feel very tired, stop exercising immediately and seek medical attention  Thanks for choosing [facility], we consider it a privelige to serve you.

## 2023-06-08 NOTE — Assessment & Plan Note (Addendum)
 Hyperlipidemia:Low fat diet discussed and encouraged. Uncontrolled Updated lab needed at/ before next visit.    Lipid Panel  Lab Results  Component Value Date   CHOL 218 (H) 04/26/2023   HDL 49 04/26/2023   LDLCALC 127 (H) 04/26/2023   TRIG 237 (H) 04/26/2023   CHOLHDL 4.4 04/26/2023     Updated lab needed at/ before next visit.

## 2023-06-08 NOTE — Progress Notes (Signed)
 Caitlin Thompson     MRN: [id number]      DOB: 06/28/1960  Chief Complaint  Patient presents with   Care Management    6-8 week follow up    HPI Caitlin Thompson is here for follow up and re-evaluation of chronic medical conditions,in particular uncontrolled hypertension,medication management and review of any available recent lab and radiology data.  Preventive health is updated, specifically  Cancer screening and Immunization.   Questions or concerns regarding consultations or procedures which the PT has had in the interim are  addressed. The PT denies any adverse reactions to current medications since the last visit.  Denies polyuria, polydipsia, blurred vision , or hypoglycemic episodes. ROS Denies recent fever or chills. Denies sinus pressure, nasal congestion, ear pain or sore throat. Denies chest congestion, productive cough or wheezing. Denies chest pains, palpitations and leg swelling Denies abdominal pain, nausea, vomiting,diarrhea or constipation.   Denies dysuria, frequency, hesitancy or incontinence. Denies joint pain, swelling and limitation in mobility. Denies headaches, seizures, numbness, or tingling. Denies depression, anxiety or insomnia. Denies skin break down or rash.   PE  BP (!) 142/80   Pulse 96   Ht 5\' 4"  (1.626 m)   Wt 186 lb (84.4 kg)   LMP 02/22/2015   SpO2 96%   BMI 31.93 kg/m   Patient alert and oriented and in no cardiopulmonary distress.  HEENT: No facial asymmetry, EOMI,     Neck supple .  Chest: Clear to auscultation bilaterally.  CVS: S1, S2 no murmurs, no S3.Regular rate.  ABD: Soft non tender.   Ext: No edema  MS: Adequate ROM spine, shoulders, hips and knees.  Skin: Intact, no ulcerations or rash noted.  Psych: Good eye contact, normal affect. Memory intact not anxious or depressed appearing.  CNS: CN 2-12 intact, power,  normal throughout.no focal deficits noted.   Assessment & Plan  Hyperlipidemia LDL goal  <100 Hyperlipidemia:Low fat diet discussed and encouraged. Uncontrolled Updated lab needed at/ before next visit.    Lipid Panel  Lab Results  Component Value Date   CHOL 218 (H) 04/26/2023   HDL 49 04/26/2023   LDLCALC 127 (H) 04/26/2023   TRIG 237 (H) 04/26/2023   CHOLHDL 4.4 04/26/2023     Updated lab needed at/ before next visit.   Type 2 diabetes mellitus with vascular disease (HCC) Diabetes associated with hypertension and hyperlipidemia  Caitlin Thompson is reminded of the importance of commitment to daily physical activity for 30 minutes or more, as able and the need to limit carbohydrate intake to 30 to 60 grams per meal to help with blood sugar control.   The need to take medication as prescribed, test blood sugar as directed, and to call between visits if there is a concern that blood sugar is uncontrolled is also discussed.   Caitlin Thompson is reminded of the importance of daily foot exam, annual eye examination, and good blood sugar, blood pressure and cholesterol control.     Latest Ref Rng & Units 04/26/2023    4:24 PM 12/08/2022    3:58 PM 08/18/2022    4:34 PM 07/30/2022    4:32 AM 07/29/2022    3:48 AM  Diabetic Labs  HbA1c 4.8 - 5.6 % 6.5       Chol 100 - 199 mg/dL 956  387      HDL >56 mg/dL 49  67      Calc LDL 0 - 99 mg/dL 433  295  Triglycerides 0 - 149 mg/dL 409  811      Creatinine 0.57 - 1.00 mg/dL 9.14  7.82  9.56  2.13  1.11       06/08/2023    3:03 PM 06/08/2023    2:50 PM 06/08/2023    2:47 PM 04/26/2023    4:07 PM 04/26/2023    4:06 PM 03/28/2023    3:40 PM 12/08/2022    2:44 PM  BP/Weight  Systolic BP 142 134 145 166 177 160 132  Diastolic BP 80 80 80 91 102 88 82  Wt. (Lbs)   186  182.08  178  BMI   31.93 kg/m2  31.25 kg/m2  30.55 kg/m2      Latest Ref Rng & Units 04/06/2023   12:00 AM 02/17/2022    3:00 PM  Foot/eye exam completion dates  Eye Exam No Retinopathy Retinopathy       Foot Form Completion   Done     This result is from an  external source.      Updated lab needed at/ before next visit.   Essential hypertension Improved, though still not at goal DASH diet and commitment to daily physical activity for a minimum of 30 minutes discussed and encouraged, as a part of hypertension management. The importance of attaining a healthy weight is also discussed.     06/08/2023    3:03 PM 06/08/2023    2:50 PM 06/08/2023    2:47 PM 04/26/2023    4:07 PM 04/26/2023    4:06 PM 03/28/2023    3:40 PM 12/08/2022    2:44 PM  BP/Weight  Systolic BP 142 134 145 166 177 160 132  Diastolic BP 80 80 80 91 102 88 82  Wt. (Lbs)   186  182.08  178  BMI   31.93 kg/m2  31.25 kg/m2  30.55 kg/m2     'Pt resisting additional med will re eval at next visit, weight loss encouraged  and limited sodium       Obesity (BMI 30.0-34.9)  Patient re-educated about  the importance of commitment to a  minimum of 150 minutes of exercise per week as able.  The importance of healthy food choices with portion control discussed, as well as eating regularly and within a 12 hour window most days. The need to choose "clean , green" food 50 to 75% of the time is discussed, as well as to make water the primary drink and set a goal of 64 ounces water daily.       06/08/2023    2:47 PM 04/26/2023    4:06 PM 12/08/2022    2:44 PM  Weight /BMI  Weight 186 lb 182 lb 1.3 oz 178 lb  Height 5\' 4"  (1.626 m) 5\' 4"  (1.626 m) 5\' 4"  (1.626 m)  BMI 31.93 kg/m2 31.25 kg/m2 30.55 kg/m2    deteriorated

## 2023-06-08 NOTE — Assessment & Plan Note (Signed)
 Diabetes associated with hypertension and hyperlipidemia  Caitlin Thompson is reminded of the importance of commitment to daily physical activity for 30 minutes or more, as able and the need to limit carbohydrate intake to 30 to 60 grams per meal to help with blood sugar control.   The need to take medication as prescribed, test blood sugar as directed, and to call between visits if there is a concern that blood sugar is uncontrolled is also discussed.   Caitlin Thompson is reminded of the importance of daily foot exam, annual eye examination, and good blood sugar, blood pressure and cholesterol control.     Latest Ref Rng & Units 04/26/2023    4:24 PM 12/08/2022    3:58 PM 08/18/2022    4:34 PM 07/30/2022    4:32 AM 07/29/2022    3:48 AM  Diabetic Labs  HbA1c 4.8 - 5.6 % 6.5       Chol 100 - 199 mg/dL 324  401      HDL >02 mg/dL 49  67      Calc LDL 0 - 99 mg/dL 725  366      Triglycerides 0 - 149 mg/dL 440  347      Creatinine 0.57 - 1.00 mg/dL 4.25  9.56  3.87  5.64  1.11       06/08/2023    3:03 PM 06/08/2023    2:50 PM 06/08/2023    2:47 PM 04/26/2023    4:07 PM 04/26/2023    4:06 PM 03/28/2023    3:40 PM 12/08/2022    2:44 PM  BP/Weight  Systolic BP 142 134 145 166 177 160 132  Diastolic BP 80 80 80 91 102 88 82  Wt. (Lbs)   186  182.08  178  BMI   31.93 kg/m2  31.25 kg/m2  30.55 kg/m2      Latest Ref Rng & Units 04/06/2023   12:00 AM 02/17/2022    3:00 PM  Foot/eye exam completion dates  Eye Exam No Retinopathy Retinopathy       Foot Form Completion   Done     This result is from an external source.      Updated lab needed at/ before next visit.

## 2023-06-08 NOTE — Assessment & Plan Note (Signed)
 Improved, though still not at goal DASH diet and commitment to daily physical activity for a minimum of 30 minutes discussed and encouraged, as a part of hypertension management. The importance of attaining a healthy weight is also discussed.     06/08/2023    3:03 PM 06/08/2023    2:50 PM 06/08/2023    2:47 PM 04/26/2023    4:07 PM 04/26/2023    4:06 PM 03/28/2023    3:40 PM 12/08/2022    2:44 PM  BP/Weight  Systolic BP 142 134 145 166 177 160 132  Diastolic BP 80 80 80 91 102 88 82  Wt. (Lbs)   186  182.08  178  BMI   31.93 kg/m2  31.25 kg/m2  30.55 kg/m2     'Pt resisting additional med will re eval at next visit, weight loss encouraged  and limited sodium

## 2023-06-08 NOTE — Assessment & Plan Note (Signed)
  Patient re-educated about  the importance of commitment to a  minimum of 150 minutes of exercise per week as able.  The importance of healthy food choices with portion control discussed, as well as eating regularly and within a 12 hour window most days. The need to choose "clean , green" food 50 to 75% of the time is discussed, as well as to make water the primary drink and set a goal of 64 ounces water daily.       06/08/2023    2:47 PM 04/26/2023    4:06 PM 12/08/2022    2:44 PM  Weight /BMI  Weight 186 lb 182 lb 1.3 oz 178 lb  Height 5\' 4"  (1.626 m) 5\' 4"  (1.626 m) 5\' 4"  (1.626 m)  BMI 31.93 kg/m2 31.25 kg/m2 30.55 kg/m2    deteriorated

## 2023-06-11 ENCOUNTER — Other Ambulatory Visit: Payer: Self-pay

## 2023-06-11 MED ORDER — LANCET DEVICE MISC: 1.0000 | Freq: Two times a day (BID) | 0 refills | Status: AC

## 2023-06-11 MED ORDER — BLOOD GLUCOSE TEST VI STRP: ORAL_STRIP | 0 refills | Status: AC

## 2023-06-11 MED ORDER — BLOOD GLUCOSE MONITORING SUPPL DEVI: 0 refills | Status: AC

## 2023-06-11 MED ORDER — LANCETS MISC. MISC: 1.0000 | Freq: Two times a day (BID) | 0 refills | Status: AC

## 2023-06-14 DIAGNOSIS — H2513 Age-related nuclear cataract, bilateral: Secondary | ICD-10-CM | POA: Diagnosis not present

## 2023-06-14 DIAGNOSIS — E113513 Type 2 diabetes mellitus with proliferative diabetic retinopathy with macular edema, bilateral: Secondary | ICD-10-CM | POA: Diagnosis not present

## 2023-06-19 ENCOUNTER — Emergency Department (HOSPITAL_COMMUNITY)
Admission: EM | Admit: 2023-06-19 | Discharge: 2023-06-19 | Disposition: A | Discharge status: Home / Self Care | Attending: Emergency Medicine | Admitting: Emergency Medicine

## 2023-06-19 DIAGNOSIS — T7840XA Allergy, unspecified, initial encounter: Secondary | ICD-10-CM | POA: Diagnosis not present

## 2023-06-19 DIAGNOSIS — S1096XA Insect bite of unspecified part of neck, initial encounter: Secondary | ICD-10-CM | POA: Insufficient documentation

## 2023-06-19 DIAGNOSIS — W57XXXA Bitten or stung by nonvenomous insect and other nonvenomous arthropods, initial encounter: Secondary | ICD-10-CM | POA: Diagnosis not present

## 2023-06-19 DIAGNOSIS — M542 Cervicalgia: Secondary | ICD-10-CM | POA: Diagnosis not present

## 2023-06-19 DIAGNOSIS — L299 Pruritus, unspecified: Secondary | ICD-10-CM | POA: Diagnosis not present

## 2023-06-19 DIAGNOSIS — Z794 Long term (current) use of insulin: Secondary | ICD-10-CM | POA: Diagnosis not present

## 2023-06-19 DIAGNOSIS — I10 Essential (primary) hypertension: Secondary | ICD-10-CM | POA: Diagnosis not present

## 2023-06-19 DIAGNOSIS — R064 Hyperventilation: Secondary | ICD-10-CM | POA: Diagnosis not present

## 2023-06-19 MED ORDER — ONDANSETRON HCL 4 MG/2ML IJ SOLN
4.0000 mg | Freq: Once | INTRAMUSCULAR | Status: AC
  Administered 2023-06-19: 4 mg via INTRAVENOUS
  Filled 2023-06-19: qty 2

## 2023-06-19 MED ORDER — IBUPROFEN 800 MG PO TABS
800.0000 mg | ORAL_TABLET | Freq: Once | ORAL | Status: AC
  Administered 2023-06-19: 800 mg via ORAL
  Filled 2023-06-19: qty 1

## 2023-06-19 MED ORDER — FAMOTIDINE 20 MG PO TABS
10.0000 mg | ORAL_TABLET | Freq: Once | ORAL | Status: AC
  Administered 2023-06-19: 10 mg via ORAL
  Filled 2023-06-19: qty 1

## 2023-06-19 MED ORDER — METHYLPREDNISOLONE SODIUM SUCC 125 MG IJ SOLR
125.0000 mg | Freq: Once | INTRAMUSCULAR | Status: AC
  Administered 2023-06-19: 125 mg via INTRAVENOUS
  Filled 2023-06-19: qty 2

## 2023-06-19 MED ORDER — DIPHENHYDRAMINE HCL 25 MG PO TABS: 25.0000 mg | ORAL_TABLET | Freq: Four times a day (QID) | ORAL | 0 refills | Status: DC | PRN

## 2023-06-19 MED ORDER — PREDNISONE 20 MG PO TABS: 20.0000 mg | ORAL_TABLET | Freq: Every day | ORAL | 0 refills | Status: AC

## 2023-06-19 MED ORDER — EPINEPHRINE 0.3 MG/0.3ML IJ SOAJ: 0.3000 mg | INTRAMUSCULAR | 0 refills | Status: AC | PRN

## 2023-06-19 NOTE — ED Provider Notes (Signed)
 [place] EMERGENCY DEPARTMENT AT [facility] Provider Note   CSN: [id number] Arrival date & time: 06/19/23  1705     History  No chief complaint on file.   Caitlin Thompson is a 63 y.o. female.  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  Patient states she was at work when she got bit by some type of insect on the left side of her neck.  She admits to immediate swelling, pain that is radiating up into her cheek and throat, nausea, and vomiting.  She denies any difficulty breathing, difficulty swallowing, or shortness of breath.  Patient called EMS from work and received 50 mg of oral Benadryl.  The history is provided by the patient. No language interpreter was used.       Home Medications Prior to Admission medications   Medication Sig Start Date End Date Taking? Authorizing Provider  Blood Glucose Monitoring Suppl DEVI May substitute to any manufacturer covered by patient's insurance. Twice daily testing. DX e11.65 06/11/23   Kerri Perches, MD  diphenhydrAMINE (BENADRYL) 25 MG tablet Take 1 tablet (25 mg total) by mouth every 6 (six) hours as needed for up to 3 days. 06/19/23 06/22/23 Yes Edwin Dada P, DO  EPINEPHrine 0.3 mg/0.3 mL IJ SOAJ injection Inject 0.3 mg into the muscle as needed for anaphylaxis. 06/19/23  Yes Edwin Dada P, DO  Glucose Blood (BLOOD GLUCOSE TEST STRIPS) STRP May substitute to any manufacturer covered by patient's insurance. Twice daily testing. DX : e11.65 06/11/23   Kerri Perches, MD  Lancet Device MISC 1 each by Does not apply route in the morning and at bedtime. May substitute to any manufacturer covered by patient's insurance Twice daily testing. Dx: e11.65 06/11/23   Kerri Perches, MD  Lancets Misc. MISC 1 each by Does not apply route in the morning and at bedtime. May substitute to any manufacturer covered by patient's insurance. Twice daily testing.  Dx11.65 06/11/23   Kerri Perches, MD  predniSONE (DELTASONE) 20 MG tablet Take 1 tablet (20 mg total) by mouth daily for 5 days. 06/19/23 06/24/23 Yes Edwin Dada P, DO  amLODipine (NORVASC) 10 MG tablet Take 1 tablet (10 mg total) by mouth daily. 08/18/22   Kerri Perches, MD  glucose blood (ONETOUCH VERIO) test strip USE 1 STRIP TO CHECK GLUCOSE THREE TIMES DAILY 04/04/23   Kerri Perches, MD  insulin NPH-regular Human (70-30) 100 UNIT/ML injection Take 10 units in the morning with breakfast and take 10 units with supper Patient not taking: Reported on 06/08/2023 07/31/22   Cleora Fleet, MD  OneTouch Delica Lancets 33G MISC Three times daily testing dx e11.65 03/24/20   Kerri Perches, MD  rosuvastatin (CRESTOR) 10 MG tablet Take 1 tablet (10 mg total) by mouth daily. 05/01/23   Kerri Perches, MD  sodium chloride flush 0.9 % SOLN injection Flush nephrostomy drain once daily with 5 mL of saline Patient not taking: Reported on 06/08/2023 07/08/22   Kennieth Francois, PA  spironolactone (ALDACTONE) 100 MG tablet Take 1 tablet (100 mg total) by mouth daily. 12/11/22   Kerri Perches, MD  tirzepatide [facility]) 10 MG/0.5ML Pen Inject 10 mg into the skin once a week. 07/03/23   Kerri Perches, MD  tirzepatide [facility]) 7.5 MG/0.5ML Pen Inject 7.5 mg into the skin once a week. 04/10/23   Kerri Perches, MD  Vitamin D,  Ergocalciferol, (DRISDOL) 1.25 MG (50000 UNIT) CAPS capsule Take 1 capsule (50,000 Units total) by mouth every 7 (seven) days. 12/12/22   Kerri Perches, MD      Allergies    Codeine, Fluconazole, Parlodel [bromocriptine mesylate], Penicillins, Sulfa antibiotics, and Sulfonamide derivatives    Review of Systems   Review of Systems  Constitutional:  Negative for chills and fever.  HENT:  Negative for ear pain and sore throat.   Eyes:  Negative for pain and visual disturbance.  Respiratory:  Negative for cough and shortness of breath.    Cardiovascular:  Negative for chest pain and palpitations.  Gastrointestinal:  Positive for nausea and vomiting. Negative for abdominal pain.  Genitourinary:  Negative for dysuria and hematuria.  Musculoskeletal:  Positive for neck pain. Negative for arthralgias and back pain.  Skin:  Negative for color change and rash.  Neurological:  Negative for seizures and syncope.  All other systems reviewed and are negative.   Physical Exam Updated Vital Signs BP 103/81 (BP Location: Left Arm)   Pulse 90   Temp 97.9 F (36.6 C) (Oral)   Resp 19   LMP 02/22/2015   SpO2 100%  Physical Exam Vitals and nursing note reviewed.  Constitutional:      General: She is not in acute distress.    Appearance: She is well-developed.  HENT:     Head: Normocephalic and atraumatic.     Jaw: Tenderness and swelling present. No trismus.      Mouth/Throat:     Lips: Pink.     Mouth: Mucous membranes are moist.     Pharynx: Oropharynx is clear.  Eyes:     Conjunctiva/sclera: Conjunctivae normal.  Cardiovascular:     Rate and Rhythm: Normal rate and regular rhythm.     Heart sounds: No murmur heard. Pulmonary:     Effort: Pulmonary effort is normal. No respiratory distress.     Breath sounds: Normal breath sounds.  Abdominal:     Palpations: Abdomen is soft.     Tenderness: There is no abdominal tenderness.  Musculoskeletal:        General: No swelling.     Cervical back: Neck supple.  Skin:    General: Skin is warm and dry.     Capillary Refill: Capillary refill takes less than 2 seconds.  Neurological:     Mental Status: She is alert.  Psychiatric:        Mood and Affect: Mood normal.     ED Results / Procedures / Treatments   Labs (all labs ordered are listed, but only abnormal results are displayed) Labs Reviewed - No data to display  EKG None  Radiology No results found.  Procedures Procedures    Medications Ordered in ED Medications  ibuprofen (ADVIL) tablet 800 mg (has  no administration in time range)  methylPREDNISolone sodium succinate (SOLU-MEDROL) 125 mg/2 mL injection 125 mg (125 mg Intravenous Given 06/19/23 1903)  famotidine (PEPCID) tablet 10 mg (10 mg Oral Given 06/19/23 1903)  ondansetron (ZOFRAN) injection 4 mg (4 mg Intravenous Given 06/19/23 1903)    ED Course/ Medical Decision Making/ A&P                                 Medical Decision Making Risk Prescription drug management.   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  Some facial swelling and  a possible puncture wound on the right side of the neck noted.  Significant tenderness to palpation of the anterior cervical muscles.  Stable blood pressure.  No difficulty tolerating secretions.  Clear oropharynx.  No hypoxia.  Patient given 50 mg of Benadryl in and route to emergency department.  Pepcid and Solu-Medrol given.  Will hobs for 1 hour for airway difficulties.  Epinephrine at this time.   Patient observed in the emergency department for 2 hours with no airway abnormalities.  Patient safe for discharge home with medication for pain control, Benadryl, and steroids.  EpiPen prescribed in the case of anaphylaxis.  Patient in no distress and overall condition improved here in the ED. Detailed discussions were had with the patient regarding current findings, and need for close f/u with PCP or on call doctor. The patient has been instructed to return immediately if the symptoms worsen in any way for re-evaluation. Patient verbalized understanding and is in agreement with current care plan. All questions answered prior to discharge.        Final Clinical Impression(s) / ED Diagnoses Final diagnoses:  Allergic reaction, initial encounter  Insect bite of neck, initial encounter    Rx / DC Orders ED Discharge Orders          Ordered    diphenhydrAMINE (BENADRYL) 25 MG tablet  Every 6 hours PRN        06/19/23 1957     predniSONE (DELTASONE) 20 MG tablet  Daily        06/19/23 1957    EPINEPHrine 0.3 mg/0.3 mL IJ SOAJ injection  As needed        06/19/23 1958              Quinn Bucco, DO 06/19/23 1959

## 2023-06-19 NOTE — ED Notes (Signed)
 Had to call for an ultasound IV   contacted [employer]

## 2023-06-19 NOTE — ED Triage Notes (Signed)
 Patient BIB EMS for complaint of pain left side of neck. States she thinks an insect bit her. Felt something crawling on her. Patient states she is nauseated. No redness, whelp, or bit to neck area. Patient rates pain 10/10.   EMS gave Benadryl 50mg  en route to ED

## 2023-06-19 NOTE — Discharge Instructions (Addendum)
 Benadryl and prednisone sent to pharmacy. Please return to ED if symptoms of shortness of breath, drooling, or difficulty tolerating secretions develop.

## 2023-06-21 ENCOUNTER — Telehealth: Payer: Self-pay | Admitting: Family Medicine

## 2023-06-21 NOTE — Telephone Encounter (Signed)
 Copied from CRM [id number]. Topic: Clinical - Medication Question >> Jun 21, 2023 10:30 AM Carlatta H wrote: Reason for CRM: Patient is also requesting and epi pen be prescribed//Please ED Notes//Please send to [id number] - [place], [place] - [address] [place] [address] Phone: [phone] Fax: [phone] Hours: Not open 24 hours

## 2023-06-21 NOTE — Telephone Encounter (Signed)
 Sent a mychart message that the ER dr sent an epipen refill on 4/15 and she should be able to pick it up

## 2023-07-04 ENCOUNTER — Inpatient Hospital Stay

## 2023-07-04 ENCOUNTER — Inpatient Hospital Stay: Payer: Self-pay | Admitting: Family Medicine

## 2023-08-02 DIAGNOSIS — E113513 Type 2 diabetes mellitus with proliferative diabetic retinopathy with macular edema, bilateral: Secondary | ICD-10-CM | POA: Diagnosis not present

## 2023-08-02 DIAGNOSIS — H2513 Age-related nuclear cataract, bilateral: Secondary | ICD-10-CM | POA: Diagnosis not present

## 2023-09-08 ENCOUNTER — Other Ambulatory Visit: Payer: Self-pay | Admitting: Family Medicine

## 2023-09-17 ENCOUNTER — Telehealth: Payer: Self-pay

## 2023-09-17 NOTE — Telephone Encounter (Signed)
 Received fax form [facility] on behalf of pt. Pt needing refill of Mounjaro  10mg . Please refill if appropriate

## 2023-09-18 NOTE — Telephone Encounter (Signed)
 Noted

## 2023-09-21 ENCOUNTER — Encounter: Payer: Self-pay | Admitting: Family Medicine

## 2023-09-21 ENCOUNTER — Ambulatory Visit: Admitting: Family Medicine

## 2023-09-21 VITALS — BP 160/90 | HR 86 | Resp 16 | Ht 64.0 in | Wt 187.1 lb

## 2023-09-21 DIAGNOSIS — R8290 Unspecified abnormal findings in urine: Secondary | ICD-10-CM | POA: Diagnosis not present

## 2023-09-21 DIAGNOSIS — E1159 Type 2 diabetes mellitus with other circulatory complications: Secondary | ICD-10-CM | POA: Diagnosis not present

## 2023-09-21 DIAGNOSIS — I10 Essential (primary) hypertension: Secondary | ICD-10-CM

## 2023-09-21 DIAGNOSIS — R252 Cramp and spasm: Secondary | ICD-10-CM

## 2023-09-21 DIAGNOSIS — N200 Calculus of kidney: Secondary | ICD-10-CM

## 2023-09-21 DIAGNOSIS — E559 Vitamin D deficiency, unspecified: Secondary | ICD-10-CM

## 2023-09-21 DIAGNOSIS — E785 Hyperlipidemia, unspecified: Secondary | ICD-10-CM

## 2023-09-21 DIAGNOSIS — Z23 Encounter for immunization: Secondary | ICD-10-CM | POA: Diagnosis not present

## 2023-09-21 MED ORDER — TIRZEPATIDE 12.5 MG/0.5ML ~~LOC~~ SOAJ: 12.5000 mg | SUBCUTANEOUS | 5 refills | Status: DC

## 2023-09-21 MED ORDER — AMLODIPINE BESYLATE 5 MG PO TABS: ORAL_TABLET | ORAL | 6 refills | Status: AC

## 2023-09-21 NOTE — Patient Instructions (Addendum)
 MD follow-up visit in 4 months.    Nurse BP check in 4 weeks.    New medication to be taken for blood pressure is amlodipine  5 mg 1 twice daily stop amlodipine  10 mg tablet, take the 5 mg tablet  this 12 hours apart.  For example if you take your first tablet at 7 in the morning plan to take the second 1 at 7 in the evening.  Continue spironolactone  100 mg once daily.  Pneumonia 20 vaccine in office today.  Labs to be drawn today that are already ordered.Please add magnesium  level dx is cramps  Please also order CCUA and reflex c/S due to malodorous urine.Note also need the urine ACR already ordered  Your blood pressure is too high our goal is to have it under 130   I encourage you strongly to take the medications on a regular basis, let us  know if you cannot tolerate the medicine   Thanks for choosing [facility], we consider it a privelige to serve you.

## 2023-09-21 NOTE — Progress Notes (Deleted)
 Patient is in office today for a nurse visit for Blood Pressure Check. Patient blood pressure was 142/82, Patient No shortness of breath

## 2023-09-22 ENCOUNTER — Ambulatory Visit: Payer: Self-pay | Admitting: Family Medicine

## 2023-09-23 LAB — CMP14+EGFR
ALT: 9 IU/L (ref 0–32)
AST: 14 IU/L (ref 0–40)
Albumin: 4.3 g/dL (ref 3.8–4.9)
Alkaline Phosphatase: 76 IU/L (ref 44–121)
BUN/Creatinine Ratio: 13 (ref 12–28)
BUN: 13 mg/dL (ref 8–27)
Bilirubin Total: 0.3 mg/dL (ref 0.0–1.2)
CO2: 23 mmol/L (ref 20–29)
Calcium: 9.6 mg/dL (ref 8.7–10.3)
Chloride: 105 mmol/L (ref 96–106)
Creatinine, Ser: 0.97 mg/dL (ref 0.57–1.00)
Globulin, Total: 3 g/dL (ref 1.5–4.5)
Glucose: 104 mg/dL — ABNORMAL HIGH (ref 70–99)
Potassium: 4.3 mmol/L (ref 3.5–5.2)
Sodium: 144 mmol/L (ref 134–144)
Total Protein: 7.3 g/dL (ref 6.0–8.5)
eGFR: 67 mL/min/1.73 (ref >59)

## 2023-09-23 LAB — CBC WITH DIFFERENTIAL/PLATELET
Basophils Absolute: 0 x10E3/uL (ref 0.0–0.2)
Basos: 0 %
EOS (ABSOLUTE): 0.1 x10E3/uL (ref 0.0–0.4)
Eos: 1 %
Hematocrit: 39.5 % (ref 34.0–46.6)
Hemoglobin: 12.9 g/dL (ref 11.1–15.9)
Immature Grans (Abs): 0 x10E3/uL (ref 0.0–0.1)
Immature Granulocytes: 0 %
Lymphocytes Absolute: 2.8 x10E3/uL (ref 0.7–3.1)
Lymphs: 39 %
MCH: 31.2 pg (ref 26.6–33.0)
MCHC: 32.7 g/dL (ref 31.5–35.7)
MCV: 96 fL (ref 79–97)
Monocytes Absolute: 0.5 x10E3/uL (ref 0.1–0.9)
Monocytes: 7 %
Neutrophils Absolute: 3.8 x10E3/uL (ref 1.4–7.0)
Neutrophils: 53 %
Platelets: 279 x10E3/uL (ref 150–450)
RBC: 4.13 x10E6/uL (ref 3.77–5.28)
RDW: 11.7 % (ref 11.7–15.4)
WBC: 7.2 x10E3/uL (ref 3.4–10.8)

## 2023-09-23 LAB — LIPID PANEL
Chol/HDL Ratio: 2.6 ratio (ref 0.0–4.4)
Cholesterol, Total: 183 mg/dL (ref 100–199)
HDL: 70 mg/dL (ref >39)
LDL Chol Calc (NIH): 97 mg/dL (ref 0–99)
Triglycerides: 86 mg/dL (ref 0–149)
VLDL Cholesterol Cal: 16 mg/dL (ref 5–40)

## 2023-09-23 LAB — MICROALBUMIN / CREATININE URINE RATIO
Creatinine, Urine: 80.8 mg/dL
Microalb/Creat Ratio: 239 mg/g{creat} — AB (ref 0–29)
Microalbumin, Urine: 193.5 ug/mL

## 2023-09-23 LAB — HEMOGLOBIN A1C
Est. average glucose Bld gHb Est-mCnc: 148 mg/dL
Hgb A1c MFr Bld: 6.8 % — ABNORMAL HIGH (ref 4.8–5.6)

## 2023-09-23 LAB — MAGNESIUM: Magnesium: 1.6 mg/dL (ref 1.6–2.3)

## 2023-09-25 ENCOUNTER — Encounter: Payer: Self-pay | Admitting: Family Medicine

## 2023-09-25 ENCOUNTER — Ambulatory Visit (HOSPITAL_COMMUNITY)
Admission: RE | Admit: 2023-09-25 | Discharge: 2023-09-25 | Disposition: A | Discharge status: Home / Self Care | Source: Ambulatory Visit | Attending: Urology | Admitting: Urology

## 2023-09-25 DIAGNOSIS — N200 Calculus of kidney: Secondary | ICD-10-CM | POA: Diagnosis not present

## 2023-09-25 DIAGNOSIS — R252 Cramp and spasm: Secondary | ICD-10-CM | POA: Insufficient documentation

## 2023-09-25 DIAGNOSIS — N201 Calculus of ureter: Secondary | ICD-10-CM | POA: Diagnosis not present

## 2023-09-25 DIAGNOSIS — Z23 Encounter for immunization: Secondary | ICD-10-CM | POA: Insufficient documentation

## 2023-09-25 DIAGNOSIS — I878 Other specified disorders of veins: Secondary | ICD-10-CM | POA: Diagnosis not present

## 2023-09-25 LAB — UA/M W/RFLX CULTURE, ROUTINE
Bilirubin, UA: NEGATIVE
Glucose, UA: NEGATIVE
Ketones, UA: NEGATIVE
Nitrite, UA: POSITIVE — AB
Specific Gravity, UA: 1.015 (ref 1.005–1.030)
Urobilinogen, Ur: 0.2 mg/dL (ref 0.2–1.0)
pH, UA: 6.5 (ref 5.0–7.5)

## 2023-09-25 LAB — URINE CULTURE, REFLEX

## 2023-09-25 LAB — MICROSCOPIC EXAMINATION
Casts: NONE SEEN /LPF
RBC, Urine: NONE SEEN /HPF (ref 0–2)
WBC, UA: >30 /HPF — AB (ref 0–5)

## 2023-09-25 MED ORDER — NITROFURANTOIN MONOHYD MACRO 100 MG PO CAPS: 100.0000 mg | ORAL_CAPSULE | Freq: Two times a day (BID) | ORAL | 0 refills | Status: AC

## 2023-09-25 NOTE — Assessment & Plan Note (Addendum)
 CCUA and c/S if abnormal, will refer back to Urology if infection present , has bilaterla stones and wa hospitalized septic in 2024

## 2023-09-25 NOTE — Progress Notes (Signed)
 Caitlin Thompson     MRN: [id number]      DOB: [birth year]  Chief Complaint  Patient presents with   Medical Management of Chronic Issues    Follow up. Needing refill of Mounjaro  10mg      HPI Caitlin Thompson is here for follow up and re-evaluation of chronic medical conditions, medication management and review of any available recent lab and radiology data.  Preventive health is updated, specifically  Cancer screening and Immunization.   Questions or concerns regarding consultations or procedures which the PT has had in the interim are  addressed. The PT is not taking amlodipine  10 mg as prescribed, willing to take 5mg  twice daily.  C/o malodorous urine x 1 week, no fever , chills or flank pain Denies polyuria, polydipsia, blurred vision , or hypoglycemic episodes. Needs refill on mounjaro     ROS Denies recent fever or chills. Denies sinus pressure, nasal congestion, ear pain or sore throat. Denies chest congestion, productive cough or wheezing. Denies chest pains, palpitations and leg swelling Denies abdominal pain, nausea, vomiting,diarrhea or constipation.   . Denies joint pain, swelling and limitation in mobility. Denies headaches, seizures, numbness, or tingling. Denies depression, anxiety or insomnia. Denies skin break down or rash.   PE  BP (!) 160/90   Pulse 86   Resp 16   Ht 5' 4 (1.626 m)   Wt 187 lb 1.9 oz (84.9 kg)   LMP 02/22/2015   SpO2 96%   BMI 32.12 kg/m   Patient alert and oriented and in no cardiopulmonary distress.  HEENT: No facial asymmetry, EOMI,     Neck supple .  Chest: Clear to auscultation bilaterally.  CVS: S1, S2 no murmurs, no S3.Regular rate.  ABD: Soft non tender. No renal angle or suprapubic tenderness  Ext: No edema  MS: Adequate ROM spine, shoulders, hips and knees.  Skin: Intact, no ulcerations or rash noted.  Psych: Good eye contact, normal affect. Memory intact not anxious or depressed appearing.  CNS: CN 2-12 intact,  power,  normal throughout.no focal deficits noted.   Assessment & Plan  Essential hypertension Uncontrolled , change way amlodipine  is dose in hope that pt will tolerate and be compliant DASH diet and commitment to daily physical activity for a minimum of 30 minutes discussed and encouraged, as a part of hypertension management. The importance of attaining a healthy weight is also discussed.     09/21/2023    3:16 PM 09/21/2023    3:15 PM 09/21/2023    2:37 PM 06/19/2023    5:14 PM 06/08/2023    3:03 PM 06/08/2023    2:50 PM 06/08/2023    2:47 PM  BP/Weight  Systolic BP 160 160 160 103 142 134 145  Diastolic BP 90 90 92 81 80 80 80  Wt. (Lbs)   187.12    186  BMI   32.12 kg/m2    31.93 kg/m2       Encounter for immunization After obtaining informed consent, the pneumonia 20 vaccine is  administered , with no adverse effect noted at the time of administration.   Hyperlipidemia LDL goal <100 Hyperlipidemia:Low fat diet discussed and encouraged.   Lipid Panel  Lab Results  Component Value Date   CHOL 183 09/21/2023   HDL 70 09/21/2023   LDLCALC 97 09/21/2023   TRIG 86 09/21/2023   CHOLHDL 2.6 09/21/2023     Controlled, no change in medication   Malodorous urine CCUA and c/S if abnormal, will refer  back to Urology if infection present , has bilaterla stones and wa hospitalized septic in 2024  Nephrolithiasis Refer for Urology eval due to recurrent / persistent UTI  Type 2 diabetes mellitus with vascular disease (HCC) Diabetes associated with hypertension and hyperlipidemia  Caitlin Thompson is reminded of the importance of commitment to daily physical activity for 30 minutes or more, as able and the need to limit carbohydrate intake to 30 to 60 grams per meal to help with blood sugar control.   The need to take medication as prescribed, test blood sugar as directed, and to call between visits if there is a concern that blood sugar is uncontrolled is also discussed.   Caitlin Thompson is reminded of the importance of daily foot exam, annual eye examination, and good blood sugar, blood pressure and cholesterol control.     Latest Ref Rng & Units 09/21/2023    3:28 PM 04/26/2023    4:24 PM 12/08/2022    3:58 PM 08/18/2022    4:34 PM 07/30/2022    4:32 AM  Diabetic Labs  HbA1c 4.8 - 5.6 % 6.8  6.5      Micro/Creat Ratio 0 - 29 mg/g creat 239       Chol 100 - 199 mg/dL 816  781  764     HDL >60 mg/dL 70  49  67     Calc LDL 0 - 99 mg/dL 97  872  849     Triglycerides 0 - 149 mg/dL 86  762  895     Creatinine 0.57 - 1.00 mg/dL 9.02  9.16  8.82  9.16  1.00       09/21/2023    3:16 PM 09/21/2023    3:15 PM 09/21/2023    2:37 PM 06/19/2023    5:14 PM 06/08/2023    3:03 PM 06/08/2023    2:50 PM 06/08/2023    2:47 PM  BP/Weight  Systolic BP 160 160 160 103 142 134 145  Diastolic BP 90 90 92 81 80 80 80  Wt. (Lbs)   187.12    186  BMI   32.12 kg/m2    31.93 kg/m2      Latest Ref Rng & Units 06/08/2023    2:40 PM 04/06/2023   12:00 AM  Foot/eye exam completion dates  Eye Exam No Retinopathy  Retinopathy      Foot Form Completion  Done      This result is from an external source.      Increase mounjaro  dose, not as well controleed and weight gain

## 2023-09-25 NOTE — Assessment & Plan Note (Signed)
 Uncontrolled , change way amlodipine  is dose in hope that pt will tolerate and be compliant DASH diet and commitment to daily physical activity for a minimum of 30 minutes discussed and encouraged, as a part of hypertension management. The importance of attaining a healthy weight is also discussed.     09/21/2023    3:16 PM 09/21/2023    3:15 PM 09/21/2023    2:37 PM 06/19/2023    5:14 PM 06/08/2023    3:03 PM 06/08/2023    2:50 PM 06/08/2023    2:47 PM  BP/Weight  Systolic BP 160 160 160 103 142 134 145  Diastolic BP 90 90 92 81 80 80 80  Wt. (Lbs)   187.12    186  BMI   32.12 kg/m2    31.93 kg/m2

## 2023-09-25 NOTE — Assessment & Plan Note (Signed)
 Refer for Urology eval due to recurrent / persistent UTI

## 2023-09-25 NOTE — Assessment & Plan Note (Signed)
 Hyperlipidemia:Low fat diet discussed and encouraged.   Lipid Panel  Lab Results  Component Value Date   CHOL 183 09/21/2023   HDL 70 09/21/2023   LDLCALC 97 09/21/2023   TRIG 86 09/21/2023   CHOLHDL 2.6 09/21/2023     Controlled, no change in medication

## 2023-09-25 NOTE — Assessment & Plan Note (Addendum)
 Diabetes associated with hypertension and hyperlipidemia  Caitlin Thompson is reminded of the importance of commitment to daily physical activity for 30 minutes or more, as able and the need to limit carbohydrate intake to 30 to 60 grams per meal to help with blood sugar control.   The need to take medication as prescribed, test blood sugar as directed, and to call between visits if there is a concern that blood sugar is uncontrolled is also discussed.   Caitlin Thompson is reminded of the importance of daily foot exam, annual eye examination, and good blood sugar, blood pressure and cholesterol control.     Latest Ref Rng & Units 09/21/2023    3:28 PM 04/26/2023    4:24 PM 12/08/2022    3:58 PM 08/18/2022    4:34 PM 07/30/2022    4:32 AM  Diabetic Labs  HbA1c 4.8 - 5.6 % 6.8  6.5      Micro/Creat Ratio 0 - 29 mg/g creat 239       Chol 100 - 199 mg/dL 816  781  764     HDL >60 mg/dL 70  49  67     Calc LDL 0 - 99 mg/dL 97  872  849     Triglycerides 0 - 149 mg/dL 86  762  895     Creatinine 0.57 - 1.00 mg/dL 9.02  9.16  8.82  9.16  1.00       09/21/2023    3:16 PM 09/21/2023    3:15 PM 09/21/2023    2:37 PM 06/19/2023    5:14 PM 06/08/2023    3:03 PM 06/08/2023    2:50 PM 06/08/2023    2:47 PM  BP/Weight  Systolic BP 160 160 160 103 142 134 145  Diastolic BP 90 90 92 81 80 80 80  Wt. (Lbs)   187.12    186  BMI   32.12 kg/m2    31.93 kg/m2      Latest Ref Rng & Units 06/08/2023    2:40 PM 04/06/2023   12:00 AM  Foot/eye exam completion dates  Eye Exam No Retinopathy  Retinopathy      Foot Form Completion  Done      This result is from an external source.      Increase mounjaro  dose, not as well controleed and weight gain

## 2023-09-25 NOTE — Assessment & Plan Note (Signed)
 After obtaining informed consent, the pneumonia 20  vaccine is  administered , with no adverse effect noted at the time of administration.

## 2023-09-26 ENCOUNTER — Encounter: Payer: Self-pay | Admitting: Urology

## 2023-09-26 ENCOUNTER — Ambulatory Visit: Payer: BC Managed Care – PPO | Admitting: Urology

## 2023-09-26 ENCOUNTER — Ambulatory Visit: Admitting: Urology

## 2023-09-26 VITALS — BP 138/76 | HR 97

## 2023-09-26 DIAGNOSIS — Z87440 Personal history of urinary (tract) infections: Secondary | ICD-10-CM

## 2023-09-26 DIAGNOSIS — N3000 Acute cystitis without hematuria: Secondary | ICD-10-CM

## 2023-09-26 DIAGNOSIS — N200 Calculus of kidney: Secondary | ICD-10-CM

## 2023-09-26 DIAGNOSIS — N390 Urinary tract infection, site not specified: Secondary | ICD-10-CM | POA: Diagnosis not present

## 2023-09-26 LAB — URINALYSIS, ROUTINE W REFLEX MICROSCOPIC
Bilirubin, UA: NEGATIVE
Glucose, UA: NEGATIVE
Nitrite, UA: NEGATIVE
Specific Gravity, UA: 1.015 (ref 1.005–1.030)
Urobilinogen, Ur: 0.2 mg/dL (ref 0.2–1.0)
pH, UA: 6 (ref 5.0–7.5)

## 2023-09-26 LAB — MICROSCOPIC EXAMINATION: WBC, UA: >30 /HPF — AB (ref 0–5)

## 2023-09-26 MED ORDER — NITROFURANTOIN MACROCRYSTAL 50 MG PO CAPS: 50.0000 mg | ORAL_CAPSULE | Freq: Every day | ORAL | 11 refills | Status: DC

## 2023-09-26 NOTE — Progress Notes (Signed)
 09/26/2023 3:22 PM   BAO COREAS [birth year] [id number]  Referring provider: Antonetta Rollene BRAVO, MD [address] [place],  [place] [address]  nephrolithiasis   HPI: Ms Bungert is a 62yo here for followup for nephrolithiasis and frequent UTI. She was started on macrobid  yesterday for a UTI. Her last 5 UTIs have been sensitive to macrobid . KUB shows no calculi. Seh denies any stone events since last visit. No flank pain currently    PMH: Past Medical History:  Diagnosis Date   Adenomatous colon polyp 06/26/2011   05/17/11 Colonoscopy Dr Gisele adenoma Next colonoscopy 05/2016    Complication of anesthesia    DERMATOMYCOSIS 05/23/2010   Qualifier: Diagnosis of  By: Antonetta MD, Margaret     Diabetes mellitus type II 03/06/2005   GERD (gastroesophageal reflux disease) 04/03/2011   Non-critical Schatzki's ring, HH on EGD 05/17/11 Dr Shaaron    Heart murmur    Hematuria    History of kidney stones    HYDRONEPHROSIS, LEFT 08/30/2009   Qualifier: Diagnosis of  By: Windell PA, Dawn     HYPERLIPIDEMIA 12/01/2009   Qualifier: Diagnosis of  By: Windell PA, Dawn     Hypertension    Obesity (BMI 30.0-34.9) 07/15/2011   Renal lithiasis 08/04/2009   Hospitalized a [facility] fr 3 days in June    Schatzki's ring    THYROID  STIMULATING HORMONE, ABNORMAL 08/30/2009   Qualifier: Diagnosis of  By: Windell PA, Dawn     Tubular adenoma of colon 05/05/2011    Surgical History: Past Surgical History:  Procedure Laterality Date   COLONOSCOPY N/A 01/29/2019   Procedure: COLONOSCOPY;  Surgeon: Shaaron Lamar HERO, MD;  Location: [facility];  Service: Endoscopy;  Laterality: N/A;  2:00   COLONOSCOPY W/ POLYPECTOMY  05/17/11   Rourk-Tubular adenoma colon, benign SB bx   CYSTOSCOPY/URETEROSCOPY/HOLMIUM LASER/STENT PLACEMENT Right 07/27/2022   Procedure: CYSTOSCOPY, RIGHT URETEROSCOPY, HOLMIUM LASER LITHOTRIPSY, RIGHT URETERAL STENT PLACEMENT, AND REMOVAL OF RIGHT NEPHROSTOMY  TUBE;  Surgeon: Cam Morene ORN, MD;  Location: [facility];  Service: Urology;  Laterality: Right;  90 MINUTES   ESOPHAGEAL DILATION  05/17/2011   Rourk-Hiatal hernia/Incomplete noncritical Schatzki's ring/HH   IR NEPHROSTOMY PLACEMENT RIGHT  07/07/2022   POLYPECTOMY  01/29/2019   Procedure: POLYPECTOMY;  Surgeon: Shaaron Lamar HERO, MD;  Location: [facility];  Service: Endoscopy;;   WISDOM TOOTH EXTRACTION      Home Medications:  Allergies as of 09/26/2023       Reactions   Codeine Nausea And Vomiting   Fluconazole  Other (See Comments)   Prolonged QT   Parlodel  [bromocriptine  Mesylate]    headache   Penicillins Hives   Sulfa Antibiotics Hives, Nausea And Vomiting, Other (See Comments)   Sulfonamide Derivatives Hives        Medication List        Accurate as of September 26, 2023  3:22 PM. If you have any questions, ask your nurse or doctor.          STOP taking these medications    diphenhydrAMINE  25 MG tablet Commonly known as: BENADRYL    sodium chloride  flush 0.9 % Soln injection       TAKE these medications    amLODipine  5 MG tablet Commonly known as: NORVASC  Take one tablet by mouth two times daily,12 hours apart for bnlood pressure   Blood Glucose Monitoring Suppl Devi May substitute to any manufacturer covered by patient's insurance. Twice daily testing. DX e11.65  EPINEPHrine  0.3 mg/0.3 mL Soaj injection Commonly known as: EPI-PEN Inject 0.3 mg into the muscle as needed for anaphylaxis.   Lancet Device Misc 1 each by Does not apply route in the morning and at bedtime. May substitute to any manufacturer covered by patient's insurance Twice daily testing. Dx: e11.65   Lancets Misc. Misc 1 each by Does not apply route in the morning and at bedtime. May substitute to any manufacturer covered by patient's insurance. Twice daily testing. Dx11.65   nitrofurantoin  (macrocrystal-monohydrate) 100 MG capsule Commonly known as: Macrobid  Take 1 capsule (100 mg  total) by mouth 2 (two) times daily for 7 days.   OneTouch Delica Lancets 33G Misc Three times daily testing dx e11.65   OneTouch Verio test strip Generic drug: glucose blood USE 1 STRIP TO CHECK GLUCOSE THREE TIMES DAILY   BLOOD GLUCOSE TEST STRIPS Strp May substitute to any manufacturer covered by patient's insurance. Twice daily testing. DX : e11.65   rosuvastatin  10 MG tablet Commonly known as: Crestor  Take 1 tablet (10 mg total) by mouth daily.   spironolactone  100 MG tablet Commonly known as: Aldactone  Take 1 tablet (100 mg total) by mouth daily.   tirzepatide  12.5 MG/0.5ML Pen Commonly known as: MOUNJARO  Inject 12.5 mg into the skin once a week.   Vitamin D  (Ergocalciferol ) 1.25 MG (50000 UNIT) Caps capsule Commonly known as: DRISDOL  Take 1 capsule (50,000 Units total) by mouth every 7 (seven) days.        Allergies:  Allergies  Allergen Reactions   Codeine Nausea And Vomiting   Fluconazole  Other (See Comments)    Prolonged QT   Parlodel  [Bromocriptine  Mesylate]     headache   Penicillins Hives   Sulfa Antibiotics Hives, Nausea And Vomiting and Other (See Comments)   Sulfonamide Derivatives Hives    Family History: Family History  Problem Relation Age of Onset   Diabetes Mother    Hypertension Mother    Kidney failure Mother    Diabetes Father    Hypertension Father    Kidney failure Father    Crohn's disease Sister    Aneurysm Sister    Breast cancer Maternal Aunt    Diabetes Brother        x2    Social History:  reports that she has never smoked. She has never used smokeless tobacco. She reports that she does not drink alcohol and does not use drugs.  ROS: All other review of systems were reviewed and are negative except what is noted above in HPI  Physical Exam: BP 138/76   Pulse 97   LMP 02/22/2015   Constitutional:  Alert and oriented, No acute distress. HEENT: [place] AT, moist mucus membranes.  Trachea midline, no  masses. Cardiovascular: No clubbing, cyanosis, or edema. Respiratory: Normal respiratory effort, no increased work of breathing. GI: Abdomen is soft, nontender, nondistended, no abdominal masses GU: No CVA tenderness.  Lymph: No cervical or inguinal lymphadenopathy. Skin: No rashes, bruises or suspicious lesions. Neurologic: Grossly intact, no focal deficits, moving all 4 extremities. Psychiatric: Normal mood and affect.  Laboratory Data: Lab Results  Component Value Date   WBC 7.2 09/21/2023   HGB 12.9 09/21/2023   HCT 39.5 09/21/2023   MCV 96 09/21/2023   PLT 279 09/21/2023    Lab Results  Component Value Date   CREATININE 0.97 09/21/2023    No results found for: PSA  No results found for: TESTOSTERONE  Lab Results  Component Value Date   HGBA1C 6.8 (H)  09/21/2023    Urinalysis    Component Value Date/Time   COLORURINE AMBER (A) 07/28/2022 0005   APPEARANCEUR Clear 09/21/2023 1527   LABSPEC 1.023 07/28/2022 0005   PHURINE 6.0 07/28/2022 0005   GLUCOSEU Negative 09/21/2023 1527   HGBUR LARGE (A) 07/28/2022 0005   HGBUR moderate 12/01/2009 1614   BILIRUBINUR Negative 09/21/2023 1527   KETONESUR 20 (A) 07/28/2022 0005   PROTEINUR 1+ (A) 09/21/2023 1527   PROTEINUR 100 (A) 07/28/2022 0005   UROBILINOGEN 0.2 08/03/2022 0945   UROBILINOGEN 0.2 08/20/2013 1457   NITRITE Positive (A) 09/21/2023 1527   NITRITE POSITIVE (A) 07/28/2022 0005   LEUKOCYTESUR 2+ (A) 09/21/2023 1527   LEUKOCYTESUR LARGE (A) 07/28/2022 0005    Lab Results  Component Value Date   LABMICR 193.5 09/21/2023   WBCUA >30 (A) 09/21/2023   LABEPIT 0-10 09/21/2023   BACTERIA Many (A) 09/21/2023    Pertinent Imaging: KUB today: Images reviewed and discussed with the patient  Results for orders placed during the hospital encounter of 09/25/23  Abdomen 1 view (KUB)  Narrative CLINICAL DATA:  Kidney stone follow-up.  EXAM: ABDOMEN - 1 VIEW  COMPARISON:  03/28/2023  FINDINGS: No  visualized urolithiasis. Multiple calcifications in the pelvis typical of phleboliths. Probable pill in the left mid abdomen. Normal bowel gas pattern. Small volume of formed stool in the colon.  IMPRESSION: No visualized urolithiasis.   Electronically Signed By: Andrea Gasman M.D. On: 09/25/2023 22:22  No results found for this or any previous visit.  No results found for this or any previous visit.  No results found for this or any previous visit.  Results for orders placed during the hospital encounter of 09/15/22  Ultrasound renal complete  Narrative CLINICAL DATA:  Nephrolithiasis.  EXAM: RENAL / URINARY TRACT ULTRASOUND COMPLETE  COMPARISON:  CT abdomen pelvis 07/27/2022  FINDINGS: Right Kidney:  Renal measurements: 10.2 x 4.6 x 6.0 cm = volume: 146.8 mL. Echogenicity within normal limits. No mass or hydronephrosis visualized.  Left Kidney:  Renal measurements: 10.9 x 6.2 x 5.8 cm = volume: 202.8 mL. Echogenicity within normal limits. No mass or hydronephrosis visualized. Multiple shadowing stones are demonstrated within the inferior pole left kidney measuring 6 mm and 5 mm.  Bladder:  Appears normal for degree of bladder distention.  Other:  None.  IMPRESSION: 1. No hydronephrosis. 2. Left nephrolithiasis.   Electronically Signed By: Bard Moats M.D. On: 09/15/2022 15:01  No results found for this or any previous visit.  No results found for this or any previous visit.  No results found for this or any previous visit.   Assessment & Plan:    1. Nephrolithiasis (Primary) Followup 6 months with KUB - Urinalysis, Routine w reflex microscopic  2. Frequent UTI -continue macrobid  100mg  BID for 7 days  No follow-ups on file.  Belvie Clara, MD  [facility] [place]

## 2023-10-03 ENCOUNTER — Encounter: Payer: Self-pay | Admitting: Urology

## 2023-10-03 NOTE — Patient Instructions (Signed)

## 2023-10-04 ENCOUNTER — Other Ambulatory Visit

## 2023-10-04 ENCOUNTER — Other Ambulatory Visit: Payer: Self-pay

## 2023-10-04 DIAGNOSIS — N390 Urinary tract infection, site not specified: Secondary | ICD-10-CM | POA: Diagnosis not present

## 2023-10-05 ENCOUNTER — Ambulatory Visit

## 2023-10-05 ENCOUNTER — Encounter: Payer: Self-pay | Admitting: Urology

## 2023-10-05 DIAGNOSIS — N390 Urinary tract infection, site not specified: Secondary | ICD-10-CM

## 2023-10-05 LAB — URINALYSIS, ROUTINE W REFLEX MICROSCOPIC
Bilirubin, UA: NEGATIVE
Glucose, UA: NEGATIVE
Ketones, UA: NEGATIVE
Nitrite, UA: NEGATIVE
Specific Gravity, UA: 1.025 (ref 1.005–1.030)
Urobilinogen, Ur: 1 mg/dL (ref 0.2–1.0)
pH, UA: 6 (ref 5.0–7.5)

## 2023-10-05 LAB — MICROSCOPIC EXAMINATION: RBC, Urine: >30 /HPF — ABNORMAL HIGH (ref 0–2)

## 2023-10-05 NOTE — Progress Notes (Signed)
 Pt came in today for urine culture per MD

## 2023-10-08 LAB — URINE CULTURE

## 2023-10-10 ENCOUNTER — Ambulatory Visit: Payer: Self-pay

## 2023-10-10 MED ORDER — NYSTATIN 100000 UNIT/GM EX CREA: 1.0000 | TOPICAL_CREAM | Freq: Two times a day (BID) | CUTANEOUS | 1 refills | Status: AC

## 2023-10-10 NOTE — Telephone Encounter (Signed)
 Msg sent to pt and med prescribed

## 2023-10-10 NOTE — Telephone Encounter (Signed)
 FYI Only or Action Required?: Action required by provider: medication refill request. Pt requesting medication for yeast infection, states that PCP typically writes for it without seeing her, recently on abx.   Patient was last seen in primary care on 09/21/2023 by Antonetta Rollene BRAVO, MD.  Called Nurse Triage reporting Vaginal Itching.  Symptoms began yesterday.  Interventions attempted: Nothing.  Symptoms are: gradually worsening.  Triage Disposition: See PCP When Office is Open (Within 3 Days)  Patient/caregiver understands and will follow disposition?: No, wishes to speak with PCP  Copied from CRM #[id number]. Topic: Clinical - Red Word Triage >> Oct 10, 2023  1:52 PM Donna BRAVO wrote: Red Word that prompted transfer to Nurse Triage: patient has vaginal itching and burning it's getting worse, was given antibiotics for UTI, finished taking last Thursday. Reason for Disposition  [1] Symptoms of a yeast infection (i.e., itchy, white discharge, not bad smelling) AND [2] not improved > 3 days following Care Advice  Answer Assessment - Initial Assessment Questions 1. SYMPTOM: What's the main symptom you're concerned about? (e.g., pain, itching, dryness)     Itching and burning 2. LOCATION: Where is the  itching and burning located? (e.g., inside/outside, left/right)     On labia 3. ONSET: When did the  itching  start?     yesterday 4. PAIN: Is there any pain? If Yes, ask: How bad is it? (Scale: 1-10; mild, moderate, severe)     4 5. ITCHING: Is there any itching? If Yes, ask: How bad is it? (Scale: 1-10; mild, moderate, severe)     12 6. CAUSE: What do you think is causing the discharge? Have you had the same problem before? What happened then?     Yeast infection 7. OTHER SYMPTOMS: Do you have any other symptoms? (e.g., fever, itching, vaginal bleeding, pain with urination, injury to genital area, vaginal foreign body)     denies Recently on abx for  UTI.  Protocols used: Vaginal Symptoms-A-AH

## 2023-10-19 ENCOUNTER — Ambulatory Visit (INDEPENDENT_AMBULATORY_CARE_PROVIDER_SITE_OTHER)

## 2023-10-19 VITALS — BP 137/81

## 2023-10-19 DIAGNOSIS — I10 Essential (primary) hypertension: Secondary | ICD-10-CM | POA: Diagnosis not present

## 2023-10-19 NOTE — Progress Notes (Signed)
 Patient is in office today for a nurse visit for Blood Pressure Check. Patient blood pressure was 137/81, Patient No chest pain, No dyspnea on exertion

## 2023-11-01 DIAGNOSIS — J018 Other acute sinusitis: Secondary | ICD-10-CM | POA: Diagnosis not present

## 2023-11-01 DIAGNOSIS — B9689 Other specified bacterial agents as the cause of diseases classified elsewhere: Secondary | ICD-10-CM | POA: Diagnosis not present

## 2023-11-15 DIAGNOSIS — H2513 Age-related nuclear cataract, bilateral: Secondary | ICD-10-CM | POA: Diagnosis not present

## 2023-11-15 DIAGNOSIS — E113513 Type 2 diabetes mellitus with proliferative diabetic retinopathy with macular edema, bilateral: Secondary | ICD-10-CM | POA: Diagnosis not present

## 2023-12-06 DIAGNOSIS — H5203 Hypermetropia, bilateral: Secondary | ICD-10-CM | POA: Diagnosis not present

## 2023-12-06 DIAGNOSIS — H52223 Regular astigmatism, bilateral: Secondary | ICD-10-CM | POA: Diagnosis not present

## 2023-12-06 DIAGNOSIS — H524 Presbyopia: Secondary | ICD-10-CM | POA: Diagnosis not present

## 2023-12-06 NOTE — Progress Notes (Signed)
 12/06/23   CHIEF COMPLAINT Patient presents for  Chief Complaint  Patient presents with  . Refraction for DMV form      HISTORY OF PRESENT ILLNESS: Caitlin Thompson is a 63 y.o. female who present to the clinic today for:  HPI   63 Year Old Female.   Patient Follows With Lorrene Ozell Pugh, MD.    Last Seen With Inocente FORBES Pao, OD On 04-06-2023.  Here For A Refraction ou.   --Patient Had PRP Done ou With Dr.Greven.        --Blurry Vision Both Eyes For Both Distance And Near.   Would Like To Get An Eyeglass Rx To See Better.    --Patient Bought In A DMV Form That She Would Like Dr.Hensel To Fill Out For Her.    Last edited by Jon Garin - [place], COA on 12/06/2023  8:00 AM.      HISTORICAL INFORMATION:  CURRENT MEDICATIONS: Medications Ordered Prior to Encounter[1]  Referring physician: Referral Self No address on file  REVIEW OF SYSTEMS ROS   Positive for: Eyes Last edited by Jon Garin - [place], COA on 12/06/2023  7:48 AM.      ALLERGIES Allergies[2]  PAST MEDICAL HISTORY Medical History[3]  PAST SURGICAL HISTORY Surgical History[4]  FAMILY HISTORY Family History[5]  SOCIAL HISTORY Social History[6]  OPHTHALMIC EXAM Base Eye Exam     Visual Acuity (Snellen - Linear)       Right Left   Dist [place] 20/40 +2 20/50 +2   Dist ph [place] NI NI         Pupils       Pupils Dark Light Shape APD   Right PERRL 3 2 Round None   Left PERRL 3 2 Round None         Neuro/Psych     Oriented x3: Yes   Mood/Affect: Normal           Refraction     Manifest Refraction (Subjective)       Sphere Cylinder Axis Dist VA   Right -0.25 +1.25 176 20/30+1   Left [place] +0.50 004 20/50         Final Rx       Sphere Cylinder Axis Dist VA Add   Right -0.25 +1.25 176 20/30+1 +2.50   Left [place] +0.50 004 20/50 +2.50    Expiration Date: 12/05/2024             IMAGING AND PROCEDURES  Imaging and Procedures for 12/06/2023:    Prior  Imaging and Procedures:    ASSESSMENT/PLAN:  1. 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     Return for Next scheduled follow up.  There are no Patient Instructions on file for this visit.   Explained the diagnoses, plan, and follow up with the patient and they expressed understanding.  Patient expressed understanding of the importance of proper follow up care.    Abbreviations: M myopia (nearsighted); A astigmatism; H hyperopia (farsighted); P presbyopia; Mrx spectacle prescription;  CTL contact lenses; OD right eye; OS left eye; OU both eyes  XT exotropia; ET esotropia; PEK punctate epithelial keratitis; PEE punctate epithelial erosions; DES dry eye syndrome; MGD meibomian gland dysfunction; ATs artificial tears; PFAT's preservative free artificial tears; NSC nuclear sclerotic cataract; PSC  posterior subcapsular cataract; ERM epi-retinal membrane; PVD posterior vitreous detachment; RD retinal detachment; DM diabetes mellitus; DR diabetic retinopathy; NPDR non-proliferative diabetic retinopathy; PDR proliferative diabetic retinopathy; CSME clinically significant macular edema; DME diabetic macular edema; dbh dot blot hemorrhages; CWS cotton wool spot; POAG primary open angle glaucoma; C/D cup-to-disc ratio; HVF humphrey visual field; GVF goldmann visual field; OCT optical coherence tomography; IOP intraocular pressure; BRVO Branch retinal vein occlusion; CRVO central retinal vein occlusion; CRAO central retinal artery occlusion; BRAO branch retinal artery occlusion; RT retinal tear; SB scleral buckle; PPV pars plana vitrectomy; VH Vitreous hemorrhage; PRP panretinal laser photocoagulation; IVK intravitreal kenalog; VMT vitreomacular traction; MH Macular hole;   NVD neovascularization of the disc; NVE neovascularization elsewhere; AREDS age related eye disease study; ARMD age related macular degeneration; POAG primary open angle glaucoma; EBMD epithelial/anterior basement membrane dystrophy; ACIOL anterior chamber intraocular lens; IOL intraocular lens; PCIOL posterior chamber intraocular lens; Phaco/IOL phacoemulsification with intraocular lens placement; PRK photorefractive keratectomy; LASIK laser assisted in situ keratomileusis; HTN hypertension; DM diabetes mellitus; COPD chronic obstructive pulmonary disease      [1] Current Outpatient Medications on File Prior to Visit  Medication Sig Dispense Refill  . glucose blood test strip May substitute to any manufacturer covered by patient's insurance. Twice daily testing. DX : e11.65    . glucose blood test strip 1 each by Other route 3 (three) times a day.    . lancets (2-IN-1 LANCET DEVICE MISC) 1 each by Not Applicable route.    . Lancets misc 1 each by Not Applicable route.    . tirzepatide  (Mounjaro ) 12.5 mg/0.5 mL subcutaneous pen injector     . amLODIPine  (NORVASC ) 10 mg tablet Take 10 mg by mouth daily.    . amLODIPine  (NORVASC ) 5 mg tablet TAKE 1 TABLET BY MOUTH TWICE DAILY 12 HOURS APART FOR BLOOD PRESSURE. STOP AMLODIPINE  10 MG    . amoxicillin-pot clavulanate (AUGMENTIN) 875-125 mg per tablet Take 1 tablet by mouth in the morning and 1 tablet in the evening.    . EPINEPHrine  (EPIPEN ) 0.3 mg/0.3 mL injection syringe Inject 0.3 mg into the thigh.    . ergocalciferol  (VITAMIN D2) 1,250 mcg (50,000 unit) capsule Take 50,000 Units by mouth.    . glipiZIDE  (GLUCOTROL ) 5 mg tablet Take 5 mg by mouth in the morning and 5 mg in the evening. Take before meals.    . insulin  NPH-insulin  regular (HumuLIN 70/30, NovoLIN 70/30) 100 unit/mL (70-30) injection Take 10 units in the morning with breakfast and take 10 units with supper    . lisinopriL (PRINIVIL) 40 mg tablet Take 40 mg by mouth daily.    .  metFORMIN  (GLUCOPHAGE ) 500 mg tablet Take 1,000 mg by mouth in the morning and 1,000 mg in the evening. Take with meals.    . Mounjaro  7.5 mg/0.5 mL subcutaneous pen injector INJECT 7.5 MG INTO THE SKIN ONCE A WEEK. DISCONTINUE 5 MG DOSE EFFECTIVE 12/08/2022    . nitrofurantoin  (MACRODANTIN ) 50 mg capsule Take 50 mg by mouth.    . nitrofurantoin , macrocrystal-monohydrate, (MACROBID ) 100 mg capsule Take 1 capsule by mouth in the morning and 1 capsule in the evening.    . nystatin  (MYCOSTATIN ) 100,000 unit/gram cream Apply 1 Application topically.    . omeprazole  (PriLOSEC) 40 mg DR capsule Take 40 mg by mouth daily.    . pravastatin  (PRAVACHOL ) 40 mg tablet Take 40 mg by mouth daily.    . rosuvastatin  (CRESTOR ) 10 mg tablet Take 10 mg by mouth daily.    SABRA  rosuvastatin  (CRESTOR ) 5 mg tablet Take 5 mg by mouth daily.    . spironolactone  (ALDACTONE ) 100 mg tablet Take 100 mg by mouth daily.    . spironolactone  (ALDACTONE ) 100 mg tablet Take 100 mg by mouth daily.     No current facility-administered medications on file prior to visit.  [2] Allergies Allergen Reactions  . Sulfa (Sulfonamide Antibiotics) Rash  . Bromocriptine  Mesylate     headache  . Codeine Other (See Comments)    Hives  . Penicillins Hives  . Sulfamethoxazole Other (See Comments)    hives  . Fluconazole  Rash    Prolonged QT  [3] Past Medical History: Diagnosis Date  . Diabetes mellitus    Med Hx: Diabetes mellitus;   . Kidney stone    Lithotripsy  [4] History reviewed. No pertinent surgical history. [5] Family History Problem Relation Name Age of Onset  . Diabetes Mother    . Diabetes Father    . Diabetes Maternal Grandmother    . Diabetes Maternal Grandfather    . Diabetes Paternal Grandmother    . Diabetes Paternal Grandfather    . Cancer Neg Hx    . Psoriasis Neg Hx    . Eczema Neg Hx    [6] Social History Tobacco Use  . Smoking status: Never    Passive exposure: Past  . Smokeless tobacco: Never   Vaping Use  . Vaping status: Never Used  . Passive vaping exposure: Yes  Substance Use Topics  . Alcohol use: Yes

## 2024-01-09 ENCOUNTER — Encounter (INDEPENDENT_AMBULATORY_CARE_PROVIDER_SITE_OTHER): Payer: Self-pay | Admitting: *Deleted

## 2024-01-25 ENCOUNTER — Other Ambulatory Visit (HOSPITAL_COMMUNITY): Payer: Self-pay | Admitting: Family Medicine

## 2024-01-25 ENCOUNTER — Ambulatory Visit: Payer: Self-pay | Admitting: Family Medicine

## 2024-01-25 ENCOUNTER — Encounter: Payer: Self-pay | Admitting: Family Medicine

## 2024-01-25 VITALS — BP 151/80 | HR 104 | Resp 16 | Ht 64.0 in | Wt 176.1 lb

## 2024-01-25 DIAGNOSIS — E559 Vitamin D deficiency, unspecified: Secondary | ICD-10-CM | POA: Diagnosis not present

## 2024-01-25 DIAGNOSIS — E1159 Type 2 diabetes mellitus with other circulatory complications: Secondary | ICD-10-CM

## 2024-01-25 DIAGNOSIS — E785 Hyperlipidemia, unspecified: Secondary | ICD-10-CM

## 2024-01-25 DIAGNOSIS — Z1231 Encounter for screening mammogram for malignant neoplasm of breast: Secondary | ICD-10-CM

## 2024-01-25 DIAGNOSIS — I10 Essential (primary) hypertension: Secondary | ICD-10-CM

## 2024-01-25 DIAGNOSIS — N1831 Chronic kidney disease, stage 3a: Secondary | ICD-10-CM

## 2024-01-25 DIAGNOSIS — D126 Benign neoplasm of colon, unspecified: Secondary | ICD-10-CM

## 2024-01-25 MED ORDER — CLONIDINE HCL 0.1 MG PO TABS
0.1000 mg | ORAL_TABLET | Freq: Every day | ORAL | 5 refills | Status: AC
Start: 2024-01-25 — End: ?

## 2024-01-25 NOTE — Patient Instructions (Addendum)
 Annual exam in 16 to 18 weeks Pls sched March mammogram at checkout  You are referred for colonoscopy due next month  New additional medication for blood pressure is clonidine  0.1 mg one at bedtime, take at same time every night, blood pressure is improving but still needs to be 130/80 or less to protect your heart, brain and kidneys  HBA1C, cmp and EGFr, tSH and vit D today   Fasting lipid, cmp and EGR and hBA1C 1 week before 4 month appt  It is important that you exercise regularly at least 30 minutes 5 times a week. If you develop chest pain, have severe difficulty breathing, or feel very tired, stop exercising immediately and seek medical attention  Think about what you will eat, plan ahead. Choose  clean, green, fresh or frozen over canned, processed or packaged foods which are more sugary, salty and fatty. 70 to 75% of food eaten should be vegetables and fruit. Three meals at set times with snacks allowed between meals, but they must be fruit or vegetables. Aim to eat over a 12 hour period , example 7 am to 7 pm, and STOP after  your last meal of the day. Drink water ,generally about 64 ounces per day, no other drink is as healthy. Fruit juice is best enjoyed in a healthy way, by EATING the fruit. Thanks for choosing [facility], we consider it a privelige to serve you.

## 2024-01-25 NOTE — Assessment & Plan Note (Signed)
 Refer to GI

## 2024-01-26 ENCOUNTER — Ambulatory Visit: Payer: Self-pay | Admitting: Family Medicine

## 2024-01-26 LAB — CMP14+EGFR
ALT: 12 IU/L (ref 0–32)
AST: 17 IU/L (ref 0–40)
Albumin: 4.6 g/dL (ref 3.9–4.9)
Alkaline Phosphatase: 75 IU/L (ref 49–135)
BUN/Creatinine Ratio: 13 (ref 12–28)
BUN: 17 mg/dL (ref 8–27)
Bilirubin Total: 0.5 mg/dL (ref 0.0–1.2)
CO2: 21 mmol/L (ref 20–29)
Calcium: 10.6 mg/dL — ABNORMAL HIGH (ref 8.7–10.3)
Chloride: 106 mmol/L (ref 96–106)
Creatinine, Ser: 1.26 mg/dL — ABNORMAL HIGH (ref 0.57–1.00)
Globulin, Total: 3.2 g/dL (ref 1.5–4.5)
Glucose: 47 mg/dL — ABNORMAL LOW (ref 70–99)
Potassium: 4.2 mmol/L (ref 3.5–5.2)
Sodium: 145 mmol/L — ABNORMAL HIGH (ref 134–144)
Total Protein: 7.8 g/dL (ref 6.0–8.5)
eGFR: 49 mL/min/1.73 — ABNORMAL LOW (ref >59)

## 2024-01-26 LAB — VITAMIN D 25 HYDROXY (VIT D DEFICIENCY, FRACTURES): Vit D, 25-Hydroxy: 27.7 ng/mL — ABNORMAL LOW (ref 30.0–100.0)

## 2024-01-26 LAB — TSH: TSH: 0.755 u[IU]/mL (ref 0.450–4.500)

## 2024-01-26 LAB — HEMOGLOBIN A1C
Est. average glucose Bld gHb Est-mCnc: 148 mg/dL
Hgb A1c MFr Bld: 6.8 % — ABNORMAL HIGH (ref 4.8–5.6)

## 2024-01-27 ENCOUNTER — Encounter: Payer: Self-pay | Admitting: Family Medicine

## 2024-01-27 DIAGNOSIS — N1831 Chronic kidney disease, stage 3a: Secondary | ICD-10-CM | POA: Insufficient documentation

## 2024-01-27 NOTE — Assessment & Plan Note (Signed)
 Controlled, no change in medication Diabetes associated with hypertension and hyperlipidemia  Caitlin Thompson is reminded of the importance of commitment to daily physical activity for 30 minutes or more, as able and the need to limit carbohydrate intake to 30 to 60 grams per meal to help with blood sugar control.   The need to take medication as prescribed, test blood sugar as directed, and to call between visits if there is a concern that blood sugar is uncontrolled is also discussed.   Caitlin Thompson is reminded of the importance of daily foot exam, annual eye examination, and good blood sugar, blood pressure and cholesterol control.     Latest Ref Rng & Units 01/25/2024    3:27 PM 09/21/2023    3:28 PM 04/26/2023    4:24 PM 12/08/2022    3:58 PM 08/18/2022    4:34 PM  Diabetic Labs  HbA1c 4.8 - 5.6 % 6.8  6.8  6.5     Micro/Creat Ratio 0 - 29 mg/g creat  239      Chol 100 - 199 mg/dL  816  781  764    HDL >60 mg/dL  70  49  67    Calc LDL 0 - 99 mg/dL  97  872  849    Triglycerides 0 - 149 mg/dL  86  762  895    Creatinine 0.57 - 1.00 mg/dL 8.73  9.02  9.16  8.82  0.83       01/25/2024    2:37 PM 10/19/2023    2:21 PM 09/26/2023    3:14 PM 09/21/2023    3:16 PM 09/21/2023    3:15 PM 09/21/2023    2:37 PM 06/19/2023    5:14 PM  BP/Weight  Systolic BP 151 137 138 160 160 160 103  Diastolic BP 80 81 76 90 90 92 81  Wt. (Lbs) 176.08     187.12   BMI 30.22 kg/m2     32.12 kg/m2       Latest Ref Rng & Units 06/08/2023    2:40 PM 04/06/2023   12:00 AM  Foot/eye exam completion dates  Eye Exam No Retinopathy  Retinopathy      Foot Form Completion  Done      This result is from an external source.

## 2024-01-27 NOTE — Progress Notes (Signed)
 Caitlin Thompson     MRN: [id number]      DOB: 04-23-1960  Chief Complaint  Patient presents with   Medical Management of Chronic Issues    Follow up     HPI [name] is here for follow up and re-evaluation of chronic medical conditions, medication management and review of any available recent lab and radiology data.  Preventive health is updated, specifically  Cancer screening and Immunization.   Questions or concerns regarding consultations or procedures which the PT has had in the interim are  addressed. The PT denies any adverse reactions to current medications since the last visit.  There are no new concerns.  There are no specific complaints   ROS Denies recent fever or chills. Denies sinus pressure, nasal congestion, ear pain or sore throat. Denies chest congestion, productive cough or wheezing. Denies chest pains, palpitations and leg swelling Denies abdominal pain, nausea, vomiting,diarrhea or constipation.   Denies dysuria, frequency, hesitancy or incontinence. Denies joint pain, swelling and limitation in mobility. Denies headaches, seizures, numbness, or tingling. Denies depression, anxiety or insomnia. Denies skin break down or rash.   PE  BP (!) 151/80   Pulse (!) 104   Resp 16   Ht 5' 4 (1.626 m)   Wt 176 lb 1.3 oz (79.9 kg)   LMP 02/22/2015   SpO2 91%   BMI 30.22 kg/m   Patient alert and oriented and in no cardiopulmonary distress.  HEENT: No facial asymmetry, EOMI,     Neck supple .  Chest: Clear to auscultation bilaterally.  CVS: S1, S2 no murmurs, no S3.Regular rate.  ABD: Soft non tender.   Ext: No edema  MS: Adequate ROM spine, shoulders, hips and knees.  Skin: Intact, no ulcerations or rash noted.  Psych: Good eye contact, normal affect. Memory intact not anxious or depressed appearing.  CNS: CN 2-12 intact, power,  normal throughout.no focal deficits noted.   Assessment & Plan Tubular adenoma of colon Colonoscopy due will  refer  to GI  Essential hypertension Uncontrolled and not at goal add clonoidine 0/1 mg at bedtime DASH diet and commitment to daily physical activity for a minimum of 30 minutes discussed and encouraged, as a part of hypertension management. The importance of attaining a healthy weight is also discussed.     01/25/2024    2:37 PM 10/19/2023    2:21 PM 09/26/2023    3:14 PM 09/21/2023    3:16 PM 09/21/2023    3:15 PM 09/21/2023    2:37 PM 06/19/2023    5:14 PM  BP/Weight  Systolic BP 151 137 138 160 160 160 103  Diastolic BP 80 81 76 90 90 92 81  Wt. (Lbs) 176.08     187.12   BMI 30.22 kg/m2     32.12 kg/m2        Hyperlipidemia LDL goal <100 Hyperlipidemia:Low fat diet discussed and encouraged.   Lipid Panel  Lab Results  Component Value Date   CHOL 183 09/21/2023   HDL 70 09/21/2023   LDLCALC 97 09/21/2023   TRIG 86 09/21/2023   CHOLHDL 2.6 09/21/2023     Controlled, no change in medication   Type 2 diabetes mellitus with vascular disease (HCC) Controlled, no change in medication Diabetes associated with hypertension and hyperlipidemia  Caitlin Thompson is reminded of the importance of commitment to daily physical activity for 30 minutes or more, as able and the need to limit carbohydrate intake to 30 to 60 grams per  meal to help with blood sugar control.   The need to take medication as prescribed, test blood sugar as directed, and to call between visits if there is a concern that blood sugar is uncontrolled is also discussed.   Caitlin Thompson is reminded of the importance of daily foot exam, annual eye examination, and good blood sugar, blood pressure and cholesterol control.     Latest Ref Rng & Units 01/25/2024    3:27 PM 09/21/2023    3:28 PM 04/26/2023    4:24 PM 12/08/2022    3:58 PM 08/18/2022    4:34 PM  Diabetic Labs  HbA1c 4.8 - 5.6 % 6.8  6.8  6.5     Micro/Creat Ratio 0 - 29 mg/g creat  239      Chol 100 - 199 mg/dL  816  781  764    HDL >60 mg/dL  70  49   67    Calc LDL 0 - 99 mg/dL  97  872  849    Triglycerides 0 - 149 mg/dL  86  762  895    Creatinine 0.57 - 1.00 mg/dL 8.73  9.02  9.16  8.82  0.83       01/25/2024    2:37 PM 10/19/2023    2:21 PM 09/26/2023    3:14 PM 09/21/2023    3:16 PM 09/21/2023    3:15 PM 09/21/2023    2:37 PM 06/19/2023    5:14 PM  BP/Weight  Systolic BP 151 137 138 160 160 160 103  Diastolic BP 80 81 76 90 90 92 81  Wt. (Lbs) 176.08     187.12   BMI 30.22 kg/m2     32.12 kg/m2       Latest Ref Rng & Units 06/08/2023    2:40 PM 04/06/2023   12:00 AM  Foot/eye exam completion dates  Eye Exam No Retinopathy  Retinopathy      Foot Form Completion  Done      This result is from an external source.        CKD stage 3a, GFR 45-59 ml/min (HCC) Refer to Nephrology to eval and manage

## 2024-01-27 NOTE — Assessment & Plan Note (Signed)
 Refer to Nephrology to eval and manage

## 2024-01-27 NOTE — Assessment & Plan Note (Signed)
 Hyperlipidemia:Low fat diet discussed and encouraged.   Lipid Panel  Lab Results  Component Value Date   CHOL 183 09/21/2023   HDL 70 09/21/2023   LDLCALC 97 09/21/2023   TRIG 86 09/21/2023   CHOLHDL 2.6 09/21/2023     Controlled, no change in medication

## 2024-01-27 NOTE — Assessment & Plan Note (Signed)
 Uncontrolled and not at goal add clonoidine 0/1 mg at bedtime DASH diet and commitment to daily physical activity for a minimum of 30 minutes discussed and encouraged, as a part of hypertension management. The importance of attaining a healthy weight is also discussed.     01/25/2024    2:37 PM 10/19/2023    2:21 PM 09/26/2023    3:14 PM 09/21/2023    3:16 PM 09/21/2023    3:15 PM 09/21/2023    2:37 PM 06/19/2023    5:14 PM  BP/Weight  Systolic BP 151 137 138 160 160 160 103  Diastolic BP 80 81 76 90 90 92 81  Wt. (Lbs) 176.08     187.12   BMI 30.22 kg/m2     32.12 kg/m2

## 2024-01-28 ENCOUNTER — Encounter (INDEPENDENT_AMBULATORY_CARE_PROVIDER_SITE_OTHER): Payer: Self-pay | Admitting: *Deleted

## 2024-02-15 ENCOUNTER — Ambulatory Visit (HOSPITAL_COMMUNITY)
Admission: RE | Admit: 2024-02-15 | Discharge: 2024-02-15 | Disposition: A | Source: Ambulatory Visit | Attending: Urology

## 2024-02-15 ENCOUNTER — Ambulatory Visit: Admitting: Urology

## 2024-02-15 ENCOUNTER — Encounter: Payer: Self-pay | Admitting: Urology

## 2024-02-15 VITALS — BP 146/77 | HR 90

## 2024-02-15 DIAGNOSIS — Z09 Encounter for follow-up examination after completed treatment for conditions other than malignant neoplasm: Secondary | ICD-10-CM | POA: Diagnosis not present

## 2024-02-15 DIAGNOSIS — Z87442 Personal history of urinary calculi: Secondary | ICD-10-CM | POA: Diagnosis not present

## 2024-02-15 DIAGNOSIS — Z87440 Personal history of urinary (tract) infections: Secondary | ICD-10-CM | POA: Diagnosis not present

## 2024-02-15 DIAGNOSIS — N3000 Acute cystitis without hematuria: Secondary | ICD-10-CM

## 2024-02-15 DIAGNOSIS — N200 Calculus of kidney: Secondary | ICD-10-CM | POA: Diagnosis not present

## 2024-02-15 DIAGNOSIS — N390 Urinary tract infection, site not specified: Secondary | ICD-10-CM

## 2024-02-15 LAB — URINALYSIS, ROUTINE W REFLEX MICROSCOPIC
Bilirubin, UA: NEGATIVE
Glucose, UA: NEGATIVE
Nitrite, UA: NEGATIVE
Specific Gravity, UA: 1.025 (ref 1.005–1.030)
Urobilinogen, Ur: 0.2 mg/dL (ref 0.2–1.0)
pH, UA: 5.5 (ref 5.0–7.5)

## 2024-02-15 LAB — MICROSCOPIC EXAMINATION

## 2024-02-15 MED ORDER — NITROFURANTOIN MACROCRYSTAL 50 MG PO CAPS: 50.0000 mg | ORAL_CAPSULE | Freq: Every day | ORAL | 11 refills | Status: AC

## 2024-02-15 NOTE — Patient Instructions (Signed)

## 2024-02-15 NOTE — Progress Notes (Unsigned)
 02/15/2024 11:09 AM   Caitlin Thompson Nov 13, 1960 [id number]  Referring provider: Antonetta Rollene BRAVO, MD [address], [address] [place],  [place] [address]  No chief complaint on file.   HPI:    PMH: Past Medical History:  Diagnosis Date   Adenomatous colon polyp 06/26/2011   05/17/11 Colonoscopy Dr Gisele adenoma Next colonoscopy 05/2016    Complication of anesthesia    DERMATOMYCOSIS 05/23/2010   Qualifier: Diagnosis of  By: Antonetta MD, Margaret     Diabetes mellitus type II 03/06/2005   GERD (gastroesophageal reflux disease) 04/03/2011   Non-critical Schatzki's ring, HH on EGD 05/17/11 Dr Shaaron    Heart murmur    Hematuria    History of kidney stones    HYDRONEPHROSIS, LEFT 08/30/2009   Qualifier: Diagnosis of  By: Windell PA, Dawn     HYPERLIPIDEMIA 12/01/2009   Qualifier: Diagnosis of  By: Windell PA, Dawn     Hypertension    Obesity (BMI 30.0-34.9) 07/15/2011   Renal lithiasis 08/04/2009   Hospitalized a [facility] fr 3 days in June    Schatzki's ring    THYROID  STIMULATING HORMONE, ABNORMAL 08/30/2009   Qualifier: Diagnosis of  By: Windell PA, Dawn     Tubular adenoma of colon 05/05/2011    Surgical History: Past Surgical History:  Procedure Laterality Date   COLONOSCOPY N/A 01/29/2019   Procedure: COLONOSCOPY;  Surgeon: Shaaron Lamar HERO, MD;  Location: [facility];  Service: Endoscopy;  Laterality: N/A;  2:00   COLONOSCOPY W/ POLYPECTOMY  05/17/11   Rourk-Tubular adenoma colon, benign SB bx   CYSTOSCOPY/URETEROSCOPY/HOLMIUM LASER/STENT PLACEMENT Right 07/27/2022   Procedure: CYSTOSCOPY, RIGHT URETEROSCOPY, HOLMIUM LASER LITHOTRIPSY, RIGHT URETERAL STENT PLACEMENT, AND REMOVAL OF RIGHT NEPHROSTOMY TUBE;  Surgeon: Cam Morene ORN, MD;  Location: [facility];  Service: Urology;  Laterality: Right;  90 MINUTES   ESOPHAGEAL DILATION  05/17/2011   Rourk-Hiatal hernia/Incomplete noncritical Schatzki's ring/HH   IR NEPHROSTOMY PLACEMENT RIGHT  07/07/2022    POLYPECTOMY  01/29/2019   Procedure: POLYPECTOMY;  Surgeon: Shaaron Lamar HERO, MD;  Location: [facility];  Service: Endoscopy;;   WISDOM TOOTH EXTRACTION      Home Medications:  Allergies as of 02/15/2024       Reactions   Codeine Nausea And Vomiting   Fluconazole  Other (See Comments)   Prolonged QT   Parlodel  [bromocriptine  Mesylate]    headache   Penicillins Hives   Sulfa Antibiotics Hives, Nausea And Vomiting, Other (See Comments)   Sulfonamide Derivatives Hives        Medication List        Accurate as of February 15, 2024 11:09 AM. If you have any questions, ask your nurse or doctor.          amLODipine  5 MG tablet Commonly known as: NORVASC  Take one tablet by mouth two times daily,12 hours apart for bnlood pressure   Blood Glucose Monitoring Suppl Devi May substitute to any manufacturer covered by patient's insurance. Twice daily testing. DX e11.65   cloNIDine  0.1 MG tablet Commonly known as: CATAPRES  Take 1 tablet (0.1 mg total) by mouth daily. Take one tablet by mouth once daily at bedtime for blood pressure   EPINEPHrine  0.3 mg/0.3 mL Soaj injection Commonly known as: EPI-PEN Inject 0.3 mg into the muscle as needed for anaphylaxis.   Lancet Device Misc 1 each by Does not apply route in the morning and at bedtime. May substitute to any manufacturer covered by patient's insurance Twice daily  testing. Dx: e11.65   Lancets Misc. Misc 1 each by Does not apply route in the morning and at bedtime. May substitute to any manufacturer covered by patient's insurance. Twice daily testing. Dx11.65   nitrofurantoin  50 MG capsule Commonly known as: Macrodantin  Take 1 capsule (50 mg total) by mouth at bedtime.   nystatin  cream Commonly known as: MYCOSTATIN  Apply 1 Application topically 2 (two) times daily.   OneTouch Delica Lancets 33G Misc Three times daily testing dx e11.65   OneTouch Verio test strip Generic drug: glucose blood USE 1 STRIP TO CHECK  GLUCOSE THREE TIMES DAILY   BLOOD GLUCOSE TEST STRIPS Strp May substitute to any manufacturer covered by patient's insurance. Twice daily testing. DX : e11.65   rosuvastatin  10 MG tablet Commonly known as: Crestor  Take 1 tablet (10 mg total) by mouth daily.   spironolactone  100 MG tablet Commonly known as: Aldactone  Take 1 tablet (100 mg total) by mouth daily.   tirzepatide  12.5 MG/0.5ML Pen Commonly known as: MOUNJARO  Inject 12.5 mg into the skin once a week.   Vitamin D  (Ergocalciferol ) 1.25 MG (50000 UNIT) Caps capsule Commonly known as: DRISDOL  Take 1 capsule (50,000 Units total) by mouth every 7 (seven) days.        Allergies: Allergies[1]  Family History: Family History  Problem Relation Age of Onset   Diabetes Mother    Hypertension Mother    Kidney failure Mother    Diabetes Father    Hypertension Father    Kidney failure Father    Crohn's disease Sister    Aneurysm Sister    Breast cancer Maternal Aunt    Diabetes Brother        x2    Social History:  reports that she has never smoked. She has never used smokeless tobacco. She reports that she does not drink alcohol and does not use drugs.  ROS: All other review of systems were reviewed and are negative except what is noted above in HPI  Physical Exam: BP (!) 146/77   Pulse 90   LMP 02/22/2015   Constitutional:  Alert and oriented, No acute distress. HEENT: [place] AT, moist mucus membranes.  Trachea midline, no masses. Cardiovascular: No clubbing, cyanosis, or edema. Respiratory: Normal respiratory effort, no increased work of breathing. GI: Abdomen is soft, nontender, nondistended, no abdominal masses GU: No CVA tenderness.  Lymph: No cervical or inguinal lymphadenopathy. Skin: No rashes, bruises or suspicious lesions. Neurologic: Grossly intact, no focal deficits, moving all 4 extremities. Psychiatric: Normal mood and affect.  Laboratory Data: Lab Results  Component Value Date   WBC 7.2  09/21/2023   HGB 12.9 09/21/2023   HCT 39.5 09/21/2023   MCV 96 09/21/2023   PLT 279 09/21/2023    Lab Results  Component Value Date   CREATININE 1.26 (H) 01/25/2024    No results found for: PSA  No results found for: TESTOSTERONE  Lab Results  Component Value Date   HGBA1C 6.8 (H) 01/25/2024    Urinalysis    Component Value Date/Time   COLORURINE AMBER (A) 07/28/2022 0005   APPEARANCEUR Cloudy (A) 10/04/2023 1425   LABSPEC 1.023 07/28/2022 0005   PHURINE 6.0 07/28/2022 0005   GLUCOSEU Negative 10/04/2023 1425   HGBUR LARGE (A) 07/28/2022 0005   HGBUR moderate 12/01/2009 1614   BILIRUBINUR Negative 10/04/2023 1425   KETONESUR 20 (A) 07/28/2022 0005   PROTEINUR 1+ (A) 10/04/2023 1425   PROTEINUR 100 (A) 07/28/2022 0005   UROBILINOGEN 0.2 08/03/2022 0945  UROBILINOGEN 0.2 08/20/2013 1457   NITRITE Negative 10/04/2023 1425   NITRITE POSITIVE (A) 07/28/2022 0005   LEUKOCYTESUR Trace (A) 10/04/2023 1425   LEUKOCYTESUR LARGE (A) 07/28/2022 0005    Lab Results  Component Value Date   LABMICR See below: 10/04/2023   WBCUA 6-10 (A) 10/04/2023   LABEPIT 0-10 10/04/2023   BACTERIA Few (A) 10/04/2023    Pertinent Imaging: *** Results for orders placed during the hospital encounter of 09/25/23  Abdomen 1 view (KUB)  Narrative CLINICAL DATA:  Kidney stone follow-up.  EXAM: ABDOMEN - 1 VIEW  COMPARISON:  03/28/2023  FINDINGS: No visualized urolithiasis. Multiple calcifications in the pelvis typical of phleboliths. Probable pill in the left mid abdomen. Normal bowel gas pattern. Small volume of formed stool in the colon.  IMPRESSION: No visualized urolithiasis.   Electronically Signed By: Andrea Gasman M.D. On: 09/25/2023 22:22  No results found for this or any previous visit.  No results found for this or any previous visit.  No results found for this or any previous visit.  Results for orders placed during the hospital encounter of  09/15/22  Ultrasound renal complete  Narrative CLINICAL DATA:  Nephrolithiasis.  EXAM: RENAL / URINARY TRACT ULTRASOUND COMPLETE  COMPARISON:  CT abdomen pelvis 07/27/2022  FINDINGS: Right Kidney:  Renal measurements: 10.2 x 4.6 x 6.0 cm = volume: 146.8 mL. Echogenicity within normal limits. No mass or hydronephrosis visualized.  Left Kidney:  Renal measurements: 10.9 x 6.2 x 5.8 cm = volume: 202.8 mL. Echogenicity within normal limits. No mass or hydronephrosis visualized. Multiple shadowing stones are demonstrated within the inferior pole left kidney measuring 6 mm and 5 mm.  Bladder:  Appears normal for degree of bladder distention.  Other:  None.  IMPRESSION: 1. No hydronephrosis. 2. Left nephrolithiasis.   Electronically Signed By: Bard Moats M.D. On: 09/15/2022 15:01  No results found for this or any previous visit.  No results found for this or any previous visit.  No results found for this or any previous visit.   Assessment & Plan:    1. Recurrent UTI Continue macrodantin  50mg  qhs - Urinalysis, Routine w reflex microscopic  2. Nephrolithiasis (Primary) Followup 1 year with [facility] - DG Abd 1 View   No follow-ups on file.  Belvie Clara, MD  [facility]      [1]  Allergies Allergen Reactions   Codeine Nausea And Vomiting   Fluconazole  Other (See Comments)    Prolonged QT   Parlodel  [Bromocriptine  Mesylate]     headache   Penicillins Hives   Sulfa Antibiotics Hives, Nausea And Vomiting and Other (See Comments)   Sulfonamide Derivatives Hives

## 2024-02-17 LAB — URINE CULTURE

## 2024-02-18 ENCOUNTER — Other Ambulatory Visit: Payer: Self-pay | Admitting: Family Medicine

## 2024-02-18 ENCOUNTER — Ambulatory Visit: Payer: Self-pay

## 2024-02-19 ENCOUNTER — Telehealth: Payer: Self-pay

## 2024-02-19 ENCOUNTER — Other Ambulatory Visit: Payer: Self-pay | Admitting: Family Medicine

## 2024-02-19 MED ORDER — TIRZEPATIDE 12.5 MG/0.5ML ~~LOC~~ SOAJ: 12.5000 mg | SUBCUTANEOUS | 5 refills | Status: DC

## 2024-02-19 NOTE — Telephone Encounter (Signed)
 Pt needing refill of Mounjaro 

## 2024-02-21 ENCOUNTER — Other Ambulatory Visit (HOSPITAL_COMMUNITY): Payer: Self-pay | Admitting: Nephrology

## 2024-02-21 DIAGNOSIS — I129 Hypertensive chronic kidney disease with stage 1 through stage 4 chronic kidney disease, or unspecified chronic kidney disease: Secondary | ICD-10-CM

## 2024-02-21 DIAGNOSIS — E1122 Type 2 diabetes mellitus with diabetic chronic kidney disease: Secondary | ICD-10-CM | POA: Diagnosis not present

## 2024-02-21 DIAGNOSIS — N1831 Chronic kidney disease, stage 3a: Secondary | ICD-10-CM | POA: Diagnosis not present

## 2024-02-21 DIAGNOSIS — N200 Calculus of kidney: Secondary | ICD-10-CM | POA: Diagnosis not present

## 2024-02-22 ENCOUNTER — Telehealth: Payer: Self-pay

## 2024-02-22 ENCOUNTER — Other Ambulatory Visit: Payer: Self-pay

## 2024-02-22 MED ORDER — TIRZEPATIDE 12.5 MG/0.5ML ~~LOC~~ SOAJ: 12.5000 mg | SUBCUTANEOUS | 5 refills | Status: AC

## 2024-02-22 NOTE — Telephone Encounter (Signed)
 Resent

## 2024-02-22 NOTE — Telephone Encounter (Signed)
 Copied from CRM #[id number]. Topic: Clinical - Prescription Issue >> Feb 22, 2024  2:53 PM Lonell PEDLAR wrote: Reason for CRM: Patient states [facility] has not received rx for   tirzepatide  (MOUNJARO ) 12.5 MG/0.5ML Pen Advised patient that it was called in on 12/16, Patient states [facility] claims to have not received it. PT c/b: [phone]

## 2024-03-14 ENCOUNTER — Other Ambulatory Visit: Payer: Self-pay | Admitting: Family Medicine

## 2024-05-16 ENCOUNTER — Ambulatory Visit (HOSPITAL_COMMUNITY)

## 2024-05-16 ENCOUNTER — Ambulatory Visit: Admitting: Family Medicine

## 2025-02-13 ENCOUNTER — Ambulatory Visit: Admitting: Urology
